# Patient Record
Sex: Female | Born: 1965
Health system: Southern US, Community
[De-identification: ages and names within clinical notes are randomized; demographics above are authoritative.]

## PROBLEM LIST (undated history)

## (undated) DIAGNOSIS — D509 Iron deficiency anemia, unspecified: Secondary | ICD-10-CM

## (undated) DIAGNOSIS — R600 Localized edema: Secondary | ICD-10-CM

## (undated) DIAGNOSIS — G4730 Sleep apnea, unspecified: Secondary | ICD-10-CM

## (undated) DIAGNOSIS — G9332 Myalgic encephalomyelitis/chronic fatigue syndrome: Secondary | ICD-10-CM

## (undated) DIAGNOSIS — K449 Diaphragmatic hernia without obstruction or gangrene: Secondary | ICD-10-CM

## (undated) DIAGNOSIS — K5730 Diverticulosis of large intestine without perforation or abscess without bleeding: Secondary | ICD-10-CM

## (undated) DIAGNOSIS — I5189 Other ill-defined heart diseases: Secondary | ICD-10-CM

## (undated) DIAGNOSIS — K219 Gastro-esophageal reflux disease without esophagitis: Secondary | ICD-10-CM

## (undated) DIAGNOSIS — Z950 Presence of cardiac pacemaker: Secondary | ICD-10-CM

## (undated) DIAGNOSIS — K829 Disease of gallbladder, unspecified: Secondary | ICD-10-CM

## (undated) DIAGNOSIS — D75839 Thrombocytosis, unspecified: Secondary | ICD-10-CM

## (undated) DIAGNOSIS — J309 Allergic rhinitis, unspecified: Secondary | ICD-10-CM

## (undated) DIAGNOSIS — G4733 Obstructive sleep apnea (adult) (pediatric): Secondary | ICD-10-CM

## (undated) DIAGNOSIS — D473 Essential (hemorrhagic) thrombocythemia: Secondary | ICD-10-CM

## (undated) DIAGNOSIS — E049 Nontoxic goiter, unspecified: Secondary | ICD-10-CM

## (undated) DIAGNOSIS — E538 Deficiency of other specified B group vitamins: Secondary | ICD-10-CM

## (undated) DIAGNOSIS — K760 Fatty (change of) liver, not elsewhere classified: Secondary | ICD-10-CM

## (undated) DIAGNOSIS — M549 Dorsalgia, unspecified: Secondary | ICD-10-CM

## (undated) DIAGNOSIS — I4891 Unspecified atrial fibrillation: Secondary | ICD-10-CM

## (undated) DIAGNOSIS — I10 Essential (primary) hypertension: Secondary | ICD-10-CM

## (undated) DIAGNOSIS — E039 Hypothyroidism, unspecified: Secondary | ICD-10-CM

## (undated) DIAGNOSIS — R011 Cardiac murmur, unspecified: Secondary | ICD-10-CM

## (undated) DIAGNOSIS — M1990 Unspecified osteoarthritis, unspecified site: Secondary | ICD-10-CM

## (undated) DIAGNOSIS — M2550 Pain in unspecified joint: Secondary | ICD-10-CM

## (undated) DIAGNOSIS — R079 Chest pain, unspecified: Secondary | ICD-10-CM

## (undated) DIAGNOSIS — E559 Vitamin D deficiency, unspecified: Secondary | ICD-10-CM

## (undated) DIAGNOSIS — K589 Irritable bowel syndrome without diarrhea: Secondary | ICD-10-CM

## (undated) DIAGNOSIS — J449 Chronic obstructive pulmonary disease, unspecified: Secondary | ICD-10-CM

## (undated) DIAGNOSIS — R0602 Shortness of breath: Secondary | ICD-10-CM

## (undated) HISTORY — DX: Unspecified osteoarthritis, unspecified site: M19.90

## (undated) HISTORY — DX: Sleep apnea, unspecified: G47.30

## (undated) HISTORY — DX: Other ill-defined heart diseases: I51.89

## (undated) HISTORY — DX: Fatty (change of) liver, not elsewhere classified: K76.0

## (undated) HISTORY — DX: Thrombocytosis, unspecified: D75.839

## (undated) HISTORY — DX: Presence of cardiac pacemaker: Z95.0

## (undated) HISTORY — DX: Morbid (severe) obesity due to excess calories: E66.01

## (undated) HISTORY — DX: Essential (hemorrhagic) thrombocythemia: D47.3

## (undated) HISTORY — DX: Chest pain, unspecified: R07.9

## (undated) HISTORY — DX: Vitamin D deficiency, unspecified: E55.9

## (undated) HISTORY — PX: LIPOMA EXCISION: SHX5283

## (undated) HISTORY — DX: Hypothyroidism, unspecified: E03.9

## (undated) HISTORY — DX: Diverticulosis of large intestine without perforation or abscess without bleeding: K57.30

## (undated) HISTORY — DX: Essential (primary) hypertension: I10

## (undated) HISTORY — DX: Localized edema: R60.0

## (undated) HISTORY — DX: Pain in unspecified joint: M25.50

## (undated) HISTORY — DX: Chronic obstructive pulmonary disease, unspecified: J44.9

## (undated) HISTORY — DX: Gastro-esophageal reflux disease without esophagitis: K21.9

## (undated) HISTORY — DX: Disease of gallbladder, unspecified: K82.9

## (undated) HISTORY — DX: Deficiency of other specified B group vitamins: E53.8

## (undated) HISTORY — DX: Diaphragmatic hernia without obstruction or gangrene: K44.9

## (undated) HISTORY — DX: Shortness of breath: R06.02

## (undated) HISTORY — DX: Obstructive sleep apnea (adult) (pediatric): G47.33

## (undated) HISTORY — DX: Iron deficiency anemia, unspecified: D50.9

## (undated) HISTORY — DX: Cardiac murmur, unspecified: R01.1

## (undated) HISTORY — DX: Nontoxic goiter, unspecified: E04.9

## (undated) HISTORY — DX: Irritable bowel syndrome, unspecified: K58.9

## (undated) HISTORY — DX: Unspecified atrial fibrillation: I48.91

## (undated) HISTORY — DX: Dorsalgia, unspecified: M54.9

## (undated) HISTORY — DX: Myalgic encephalomyelitis/chronic fatigue syndrome: G93.32

## (undated) HISTORY — DX: Allergic rhinitis, unspecified: J30.9

---

## 1998-03-23 ENCOUNTER — Emergency Department (HOSPITAL_COMMUNITY): Admission: EM | Admit: 1998-03-23 | Discharge: 1998-03-23 | Payer: Self-pay | Admitting: Emergency Medicine

## 1998-03-23 ENCOUNTER — Encounter: Payer: Self-pay | Admitting: Emergency Medicine

## 1998-08-17 ENCOUNTER — Other Ambulatory Visit: Admission: RE | Admit: 1998-08-17 | Discharge: 1998-08-17 | Payer: Self-pay | Admitting: Internal Medicine

## 1998-08-23 ENCOUNTER — Ambulatory Visit (HOSPITAL_COMMUNITY): Admission: RE | Admit: 1998-08-23 | Discharge: 1998-08-23 | Payer: Self-pay | Admitting: Internal Medicine

## 1999-12-26 ENCOUNTER — Other Ambulatory Visit: Admission: RE | Admit: 1999-12-26 | Discharge: 1999-12-26 | Payer: Self-pay | Admitting: Internal Medicine

## 2000-10-28 ENCOUNTER — Encounter: Admission: RE | Admit: 2000-10-28 | Discharge: 2001-01-26 | Payer: Self-pay | Admitting: Internal Medicine

## 2001-01-05 ENCOUNTER — Other Ambulatory Visit: Admission: RE | Admit: 2001-01-05 | Discharge: 2001-01-05 | Payer: Self-pay | Admitting: Internal Medicine

## 2001-01-14 ENCOUNTER — Ambulatory Visit (HOSPITAL_COMMUNITY): Admission: RE | Admit: 2001-01-14 | Discharge: 2001-01-14 | Payer: Self-pay | Admitting: Internal Medicine

## 2001-01-14 ENCOUNTER — Encounter: Payer: Self-pay | Admitting: Internal Medicine

## 2001-05-03 ENCOUNTER — Encounter: Admission: RE | Admit: 2001-05-03 | Discharge: 2001-08-01 | Payer: Self-pay | Admitting: Internal Medicine

## 2002-11-09 ENCOUNTER — Ambulatory Visit (HOSPITAL_COMMUNITY): Admission: RE | Admit: 2002-11-09 | Discharge: 2002-11-09 | Payer: Self-pay | Admitting: Internal Medicine

## 2002-11-09 ENCOUNTER — Encounter: Payer: Self-pay | Admitting: Internal Medicine

## 2002-11-11 ENCOUNTER — Encounter: Payer: Self-pay | Admitting: Internal Medicine

## 2002-11-11 ENCOUNTER — Encounter: Admission: RE | Admit: 2002-11-11 | Discharge: 2002-11-11 | Payer: Self-pay | Admitting: Internal Medicine

## 2004-10-15 ENCOUNTER — Encounter: Admission: RE | Admit: 2004-10-15 | Discharge: 2004-10-15 | Payer: Self-pay | Admitting: Internal Medicine

## 2005-09-24 ENCOUNTER — Encounter: Payer: Self-pay | Admitting: Internal Medicine

## 2006-07-17 ENCOUNTER — Encounter: Admission: RE | Admit: 2006-07-17 | Discharge: 2006-07-17 | Payer: Self-pay | Admitting: Family Medicine

## 2006-09-17 ENCOUNTER — Ambulatory Visit: Payer: Self-pay | Admitting: Family Medicine

## 2006-11-25 ENCOUNTER — Encounter (INDEPENDENT_AMBULATORY_CARE_PROVIDER_SITE_OTHER): Payer: Self-pay | Admitting: Family Medicine

## 2006-11-25 ENCOUNTER — Ambulatory Visit (HOSPITAL_BASED_OUTPATIENT_CLINIC_OR_DEPARTMENT_OTHER): Admission: RE | Admit: 2006-11-25 | Discharge: 2006-11-25 | Payer: Self-pay | Admitting: Surgery

## 2006-11-25 ENCOUNTER — Encounter (INDEPENDENT_AMBULATORY_CARE_PROVIDER_SITE_OTHER): Payer: Self-pay | Admitting: Surgery

## 2006-12-17 ENCOUNTER — Ambulatory Visit: Payer: Self-pay | Admitting: Family Medicine

## 2006-12-17 DIAGNOSIS — M549 Dorsalgia, unspecified: Secondary | ICD-10-CM | POA: Insufficient documentation

## 2006-12-17 DIAGNOSIS — E039 Hypothyroidism, unspecified: Secondary | ICD-10-CM | POA: Insufficient documentation

## 2006-12-17 DIAGNOSIS — M545 Low back pain, unspecified: Secondary | ICD-10-CM | POA: Insufficient documentation

## 2006-12-17 DIAGNOSIS — D759 Disease of blood and blood-forming organs, unspecified: Secondary | ICD-10-CM | POA: Insufficient documentation

## 2006-12-17 DIAGNOSIS — I10 Essential (primary) hypertension: Secondary | ICD-10-CM | POA: Insufficient documentation

## 2006-12-21 ENCOUNTER — Telehealth (INDEPENDENT_AMBULATORY_CARE_PROVIDER_SITE_OTHER): Payer: Self-pay | Admitting: *Deleted

## 2006-12-21 LAB — CONVERTED CEMR LAB
BUN: 9 mg/dL (ref 6–23)
Basophils Absolute: 0 10*3/uL (ref 0.0–0.1)
GFR calc Af Amer: 142 mL/min
Hemoglobin: 11.3 g/dL — ABNORMAL LOW (ref 12.0–15.0)
Lymphocytes Relative: 33.8 % (ref 12.0–46.0)
MCHC: 33.9 g/dL (ref 30.0–36.0)
MCV: 77 fL — ABNORMAL LOW (ref 78.0–100.0)
Monocytes Absolute: 0.7 10*3/uL (ref 0.2–0.7)
Monocytes Relative: 8.9 % (ref 3.0–11.0)
Neutro Abs: 4.4 10*3/uL (ref 1.4–7.7)
Potassium: 3.8 meq/L (ref 3.5–5.1)
TSH: 3.12 microintl units/mL (ref 0.35–5.50)

## 2006-12-23 ENCOUNTER — Ambulatory Visit: Payer: Self-pay | Admitting: Family Medicine

## 2006-12-23 DIAGNOSIS — D508 Other iron deficiency anemias: Secondary | ICD-10-CM | POA: Insufficient documentation

## 2006-12-23 LAB — CONVERTED CEMR LAB
Eosinophils Absolute: 0.1 10*3/uL (ref 0.0–0.6)
Eosinophils Relative: 1.3 % (ref 0.0–5.0)
Ferritin: 8.7 ng/mL — ABNORMAL LOW (ref 10.0–291.0)
Iron: 31 ug/dL — ABNORMAL LOW (ref 42–145)
MCV: 77.6 fL — ABNORMAL LOW (ref 78.0–100.0)
Monocytes Relative: 6 % (ref 3.0–11.0)
Platelets: 530 10*3/uL — ABNORMAL HIGH (ref 150–400)
RBC: 4.28 M/uL (ref 3.87–5.11)
Transferrin: 280.6 mg/dL (ref >=212.0)
WBC: 7.2 10*3/uL (ref 4.5–10.5)

## 2006-12-24 ENCOUNTER — Ambulatory Visit: Payer: Self-pay | Admitting: Family Medicine

## 2006-12-24 ENCOUNTER — Telehealth (INDEPENDENT_AMBULATORY_CARE_PROVIDER_SITE_OTHER): Payer: Self-pay | Admitting: *Deleted

## 2006-12-24 DIAGNOSIS — R079 Chest pain, unspecified: Secondary | ICD-10-CM | POA: Insufficient documentation

## 2006-12-24 DIAGNOSIS — J329 Chronic sinusitis, unspecified: Secondary | ICD-10-CM | POA: Insufficient documentation

## 2006-12-31 ENCOUNTER — Ambulatory Visit: Payer: Self-pay | Admitting: Family Medicine

## 2007-01-11 ENCOUNTER — Telehealth (INDEPENDENT_AMBULATORY_CARE_PROVIDER_SITE_OTHER): Payer: Self-pay | Admitting: *Deleted

## 2007-01-22 ENCOUNTER — Ambulatory Visit: Payer: Self-pay | Admitting: Gastroenterology

## 2007-01-27 ENCOUNTER — Ambulatory Visit: Payer: Self-pay | Admitting: Cardiology

## 2007-02-08 ENCOUNTER — Encounter (INDEPENDENT_AMBULATORY_CARE_PROVIDER_SITE_OTHER): Payer: Self-pay | Admitting: Family Medicine

## 2007-02-08 ENCOUNTER — Ambulatory Visit: Payer: Self-pay

## 2007-02-09 ENCOUNTER — Ambulatory Visit: Payer: Self-pay

## 2007-02-11 ENCOUNTER — Ambulatory Visit: Payer: Self-pay | Admitting: Family Medicine

## 2007-02-14 LAB — CONVERTED CEMR LAB
Basophils Absolute: 0 10*3/uL (ref 0.0–0.1)
Basophils Relative: 0.6 % (ref 0.0–1.0)
Hemoglobin: 12.6 g/dL (ref 12.0–15.0)
Monocytes Absolute: 0.5 10*3/uL (ref 0.2–0.7)
Monocytes Relative: 7.3 % (ref 3.0–11.0)
Platelets: 518 10*3/uL — ABNORMAL HIGH (ref 150–400)
RBC: 4.71 M/uL (ref 3.87–5.11)
RDW: 18.2 % — ABNORMAL HIGH (ref 11.5–14.6)

## 2007-02-15 ENCOUNTER — Telehealth (INDEPENDENT_AMBULATORY_CARE_PROVIDER_SITE_OTHER): Payer: Self-pay | Admitting: *Deleted

## 2007-02-22 ENCOUNTER — Ambulatory Visit: Payer: Self-pay | Admitting: Gastroenterology

## 2007-02-24 ENCOUNTER — Telehealth (INDEPENDENT_AMBULATORY_CARE_PROVIDER_SITE_OTHER): Payer: Self-pay | Admitting: *Deleted

## 2007-02-26 ENCOUNTER — Encounter (INDEPENDENT_AMBULATORY_CARE_PROVIDER_SITE_OTHER): Payer: Self-pay | Admitting: Family Medicine

## 2007-03-24 ENCOUNTER — Ambulatory Visit: Payer: Self-pay | Admitting: Family Medicine

## 2007-03-30 ENCOUNTER — Telehealth (INDEPENDENT_AMBULATORY_CARE_PROVIDER_SITE_OTHER): Payer: Self-pay | Admitting: *Deleted

## 2007-03-30 LAB — CONVERTED CEMR LAB
Basophils Relative: 0.3 % (ref 0.0–1.0)
CO2: 32 meq/L (ref 19–32)
Eosinophils Absolute: 0.1 10*3/uL (ref 0.0–0.6)
Eosinophils Relative: 1.5 % (ref 0.0–5.0)
GFR calc Af Amer: 503 mL/min
GFR calc non Af Amer: 416 mL/min
Glucose, Bld: 105 mg/dL — ABNORMAL HIGH (ref 70–99)
HCT: 37.3 % (ref 36.0–46.0)
Hemoglobin: 12.4 g/dL (ref 12.0–15.0)
Lymphocytes Relative: 32.4 % (ref 12.0–46.0)
MCV: 84.1 fL (ref 78.0–100.0)
Monocytes Absolute: 0.4 10*3/uL (ref 0.2–0.7)
Neutro Abs: 4.6 10*3/uL (ref 1.4–7.7)
Neutrophils Relative %: 61 % (ref 43.0–77.0)
Sodium: 139 meq/L (ref 135–145)
WBC: 7.6 10*3/uL (ref 4.5–10.5)

## 2007-04-05 ENCOUNTER — Ambulatory Visit: Payer: Self-pay | Admitting: Oncology

## 2007-04-06 ENCOUNTER — Ambulatory Visit: Payer: Self-pay | Admitting: Family Medicine

## 2007-04-26 HISTORY — PX: ENDOMETRIAL ABLATION: SHX621

## 2007-04-27 LAB — CBC WITH DIFFERENTIAL/PLATELET
Basophils Absolute: 0 10*3/uL (ref 0.0–0.1)
EOS%: 1.2 % (ref 0.0–7.0)
Eosinophils Absolute: 0.1 10*3/uL (ref 0.0–0.5)
HGB: 13.3 g/dL (ref 11.6–15.9)
MCH: 28.8 pg (ref 26.0–34.0)
NEUT#: 5 10*3/uL (ref 1.5–6.5)
RDW: 16.5 % — ABNORMAL HIGH (ref 11.3–14.5)
WBC: 8.7 10*3/uL (ref 3.9–10.0)
lymph#: 3 10*3/uL (ref 0.9–3.3)

## 2007-04-27 LAB — COMPREHENSIVE METABOLIC PANEL
AST: 21 U/L (ref 0–37)
Albumin: 4.2 g/dL (ref 3.5–5.2)
BUN: 11 mg/dL (ref 6–23)
Calcium: 9.3 mg/dL (ref 8.4–10.5)
Chloride: 102 mEq/L (ref 96–112)
Potassium: 3.8 mEq/L (ref 3.5–5.3)

## 2007-04-27 LAB — IRON AND TIBC: TIBC: 340 ug/dL (ref 250–470)

## 2007-05-18 ENCOUNTER — Encounter (INDEPENDENT_AMBULATORY_CARE_PROVIDER_SITE_OTHER): Payer: Self-pay | Admitting: Obstetrics and Gynecology

## 2007-05-18 ENCOUNTER — Ambulatory Visit (HOSPITAL_COMMUNITY): Admission: RE | Admit: 2007-05-18 | Discharge: 2007-05-18 | Payer: Self-pay | Admitting: Obstetrics and Gynecology

## 2007-06-17 ENCOUNTER — Emergency Department (HOSPITAL_COMMUNITY): Admission: EM | Admit: 2007-06-17 | Discharge: 2007-06-17 | Payer: Self-pay | Admitting: Emergency Medicine

## 2007-06-17 ENCOUNTER — Telehealth (INDEPENDENT_AMBULATORY_CARE_PROVIDER_SITE_OTHER): Payer: Self-pay | Admitting: Family Medicine

## 2007-06-18 ENCOUNTER — Ambulatory Visit: Payer: Self-pay | Admitting: Family Medicine

## 2007-06-18 DIAGNOSIS — R51 Headache: Secondary | ICD-10-CM | POA: Insufficient documentation

## 2007-06-18 DIAGNOSIS — R519 Headache, unspecified: Secondary | ICD-10-CM | POA: Insufficient documentation

## 2007-07-06 ENCOUNTER — Ambulatory Visit: Payer: Self-pay | Admitting: Family Medicine

## 2007-07-06 ENCOUNTER — Encounter (INDEPENDENT_AMBULATORY_CARE_PROVIDER_SITE_OTHER): Payer: Self-pay | Admitting: *Deleted

## 2007-07-06 LAB — CONVERTED CEMR LAB
BUN: 11 mg/dL (ref 6–23)
CO2: 30 meq/L (ref 19–32)
Creatinine, Ser: 0.7 mg/dL (ref 0.4–1.2)
GFR calc Af Amer: 118 mL/min
Glucose, Bld: 92 mg/dL (ref 70–99)
Potassium: 3.8 meq/L (ref 3.5–5.1)

## 2007-07-23 ENCOUNTER — Ambulatory Visit: Payer: Self-pay | Admitting: Family Medicine

## 2007-07-30 ENCOUNTER — Ambulatory Visit: Payer: Self-pay | Admitting: Family Medicine

## 2007-08-02 LAB — CONVERTED CEMR LAB
Basophils Relative: 0.3 % (ref 0.0–1.0)
Eosinophils Relative: 1.6 % (ref 0.0–5.0)
HDL: 36.6 mg/dL — ABNORMAL LOW (ref >39.0)
Hemoglobin: 13.2 g/dL (ref 12.0–15.0)
LDL Cholesterol: 140 mg/dL — ABNORMAL HIGH (ref 0–99)
Lymphocytes Relative: 30.5 % (ref 12.0–46.0)
Monocytes Relative: 4.4 % (ref 3.0–11.0)
Neutro Abs: 4.7 10*3/uL (ref 1.4–7.7)
Platelets: 446 10*3/uL — ABNORMAL HIGH (ref 150–400)
RDW: 13.9 % (ref 11.5–14.6)
Triglycerides: 83 mg/dL (ref 0–149)
VLDL: 17 mg/dL (ref 0–40)
WBC: 7.4 10*3/uL (ref 4.5–10.5)

## 2007-08-04 ENCOUNTER — Telehealth (INDEPENDENT_AMBULATORY_CARE_PROVIDER_SITE_OTHER): Payer: Self-pay | Admitting: Family Medicine

## 2007-08-04 ENCOUNTER — Encounter (INDEPENDENT_AMBULATORY_CARE_PROVIDER_SITE_OTHER): Payer: Self-pay | Admitting: *Deleted

## 2007-08-06 ENCOUNTER — Encounter: Admission: RE | Admit: 2007-08-06 | Discharge: 2007-08-06 | Payer: Self-pay | Admitting: Family Medicine

## 2007-08-23 DIAGNOSIS — J309 Allergic rhinitis, unspecified: Secondary | ICD-10-CM | POA: Insufficient documentation

## 2007-08-23 DIAGNOSIS — D649 Anemia, unspecified: Secondary | ICD-10-CM | POA: Insufficient documentation

## 2007-08-23 DIAGNOSIS — K5730 Diverticulosis of large intestine without perforation or abscess without bleeding: Secondary | ICD-10-CM | POA: Insufficient documentation

## 2007-09-27 ENCOUNTER — Ambulatory Visit: Payer: Self-pay | Admitting: Oncology

## 2007-09-29 LAB — CBC WITH DIFFERENTIAL/PLATELET
Eosinophils Absolute: 0.1 10*3/uL (ref 0.0–0.5)
HCT: 39.3 % (ref 34.8–46.6)
LYMPH%: 31.3 % (ref 14.0–48.0)
MCHC: 34.9 g/dL (ref 32.0–36.0)
MONO#: 0.5 10*3/uL (ref 0.1–0.9)
NEUT%: 62.5 % (ref 39.6–76.8)
Platelets: 594 10*3/uL — ABNORMAL HIGH (ref 145–400)
WBC: 10 10*3/uL (ref 3.9–10.0)

## 2007-09-29 LAB — FERRITIN: Ferritin: 58 ng/mL (ref 10–291)

## 2007-09-29 LAB — IRON AND TIBC: %SAT: 19 % — ABNORMAL LOW (ref 20–55)

## 2007-10-13 ENCOUNTER — Telehealth (INDEPENDENT_AMBULATORY_CARE_PROVIDER_SITE_OTHER): Payer: Self-pay | Admitting: *Deleted

## 2007-11-22 ENCOUNTER — Ambulatory Visit: Payer: Self-pay | Admitting: Family Medicine

## 2007-11-30 ENCOUNTER — Telehealth (INDEPENDENT_AMBULATORY_CARE_PROVIDER_SITE_OTHER): Payer: Self-pay | Admitting: *Deleted

## 2007-11-30 DIAGNOSIS — E049 Nontoxic goiter, unspecified: Secondary | ICD-10-CM | POA: Insufficient documentation

## 2007-12-02 ENCOUNTER — Encounter (INDEPENDENT_AMBULATORY_CARE_PROVIDER_SITE_OTHER): Payer: Self-pay | Admitting: *Deleted

## 2007-12-02 LAB — CONVERTED CEMR LAB
Basophils Absolute: 0 10*3/uL (ref 0.0–0.1)
CO2: 30 meq/L (ref 19–32)
Calcium: 8.9 mg/dL (ref 8.4–10.5)
Chloride: 100 meq/L (ref 96–112)
Cholesterol: 198 mg/dL (ref 0–200)
Free T4: 1.2 ng/dL (ref 0.6–1.6)
Glucose, Bld: 89 mg/dL (ref 70–99)
HDL: 39.4 mg/dL (ref >39.0)
LDL Cholesterol: 134 mg/dL — ABNORMAL HIGH (ref 0–99)
Lymphocytes Relative: 31.5 % (ref 12.0–46.0)
MCHC: 33.5 g/dL (ref 30.0–36.0)
Monocytes Relative: 7.4 % (ref 3.0–12.0)
Neutro Abs: 4.5 10*3/uL (ref 1.4–7.7)
Neutrophils Relative %: 59 % (ref 43.0–77.0)
Platelets: 451 10*3/uL — ABNORMAL HIGH (ref 150–400)
Potassium: 3.4 meq/L — ABNORMAL LOW (ref 3.5–5.1)
RDW: 13.2 % (ref 11.5–14.6)
Sodium: 139 meq/L (ref 135–145)
T3, Free: 3.6 pg/mL (ref 2.3–4.2)
TSH: 1.36 microintl units/mL (ref 0.35–5.50)
Total CHOL/HDL Ratio: 5
Triglycerides: 125 mg/dL (ref 0–149)
VLDL: 25 mg/dL (ref 0–40)

## 2007-12-03 ENCOUNTER — Encounter: Admission: RE | Admit: 2007-12-03 | Discharge: 2007-12-03 | Payer: Self-pay | Admitting: Family Medicine

## 2007-12-06 ENCOUNTER — Telehealth (INDEPENDENT_AMBULATORY_CARE_PROVIDER_SITE_OTHER): Payer: Self-pay | Admitting: *Deleted

## 2007-12-08 ENCOUNTER — Ambulatory Visit: Payer: Self-pay | Admitting: Pulmonary Disease

## 2007-12-13 ENCOUNTER — Encounter: Payer: Self-pay | Admitting: Pulmonary Disease

## 2007-12-13 ENCOUNTER — Ambulatory Visit (HOSPITAL_BASED_OUTPATIENT_CLINIC_OR_DEPARTMENT_OTHER): Admission: RE | Admit: 2007-12-13 | Discharge: 2007-12-13 | Payer: Self-pay | Admitting: Pulmonary Disease

## 2007-12-16 ENCOUNTER — Ambulatory Visit: Payer: Self-pay | Admitting: Family Medicine

## 2007-12-17 ENCOUNTER — Encounter (INDEPENDENT_AMBULATORY_CARE_PROVIDER_SITE_OTHER): Payer: Self-pay | Admitting: *Deleted

## 2007-12-17 LAB — CONVERTED CEMR LAB
BUN: 9 mg/dL (ref 6–23)
GFR calc Af Amer: 141 mL/min
GFR calc non Af Amer: 117 mL/min
Potassium: 3.8 meq/L (ref 3.5–5.1)

## 2007-12-30 ENCOUNTER — Telehealth (INDEPENDENT_AMBULATORY_CARE_PROVIDER_SITE_OTHER): Payer: Self-pay | Admitting: *Deleted

## 2007-12-30 ENCOUNTER — Ambulatory Visit: Payer: Self-pay | Admitting: Pulmonary Disease

## 2007-12-31 ENCOUNTER — Telehealth: Payer: Self-pay | Admitting: Family Medicine

## 2007-12-31 ENCOUNTER — Emergency Department (HOSPITAL_COMMUNITY): Admission: EM | Admit: 2007-12-31 | Discharge: 2007-12-31 | Payer: Self-pay | Admitting: Emergency Medicine

## 2008-01-04 ENCOUNTER — Ambulatory Visit: Payer: Self-pay | Admitting: Family Medicine

## 2008-01-04 DIAGNOSIS — R197 Diarrhea, unspecified: Secondary | ICD-10-CM | POA: Insufficient documentation

## 2008-01-04 DIAGNOSIS — J069 Acute upper respiratory infection, unspecified: Secondary | ICD-10-CM | POA: Insufficient documentation

## 2008-01-04 DIAGNOSIS — K219 Gastro-esophageal reflux disease without esophagitis: Secondary | ICD-10-CM | POA: Insufficient documentation

## 2008-01-04 DIAGNOSIS — K589 Irritable bowel syndrome without diarrhea: Secondary | ICD-10-CM | POA: Insufficient documentation

## 2008-01-04 LAB — CONVERTED CEMR LAB
Glucose, Urine, Semiquant: NEGATIVE
Ketones, urine, test strip: NEGATIVE
Nitrite: NEGATIVE
WBC Urine, dipstick: NEGATIVE
pH: 6.5

## 2008-01-05 ENCOUNTER — Ambulatory Visit: Payer: Self-pay | Admitting: Pulmonary Disease

## 2008-01-06 ENCOUNTER — Encounter: Payer: Self-pay | Admitting: Family Medicine

## 2008-01-07 ENCOUNTER — Encounter (INDEPENDENT_AMBULATORY_CARE_PROVIDER_SITE_OTHER): Payer: Self-pay | Admitting: *Deleted

## 2008-01-10 ENCOUNTER — Encounter (INDEPENDENT_AMBULATORY_CARE_PROVIDER_SITE_OTHER): Payer: Self-pay | Admitting: *Deleted

## 2008-01-18 ENCOUNTER — Encounter (INDEPENDENT_AMBULATORY_CARE_PROVIDER_SITE_OTHER): Payer: Self-pay | Admitting: *Deleted

## 2008-01-18 LAB — CONVERTED CEMR LAB
ALT: 18 units/L (ref 0–35)
AST: 21 units/L (ref 0–37)
Alkaline Phosphatase: 106 units/L (ref 39–117)
Basophils Absolute: 0 10*3/uL (ref 0.0–0.1)
Bilirubin, Direct: 0.1 mg/dL (ref 0.0–0.3)
CO2: 28 meq/L (ref 19–32)
Chloride: 98 meq/L (ref 96–112)
Lymphocytes Relative: 31.6 % (ref 12.0–46.0)
MCHC: 34 g/dL (ref 30.0–36.0)
Neutrophils Relative %: 60 % (ref 43.0–77.0)
RBC: 4.49 M/uL (ref 3.87–5.11)
RDW: 13.2 % (ref 11.5–14.6)
Sodium: 136 meq/L (ref 135–145)
TSH: 2.35 microintl units/mL (ref 0.35–5.50)
Total Bilirubin: 0.8 mg/dL (ref 0.3–1.2)

## 2008-02-02 ENCOUNTER — Ambulatory Visit: Payer: Self-pay | Admitting: Pulmonary Disease

## 2008-02-23 ENCOUNTER — Encounter: Payer: Self-pay | Admitting: Pulmonary Disease

## 2008-03-20 ENCOUNTER — Telehealth (INDEPENDENT_AMBULATORY_CARE_PROVIDER_SITE_OTHER): Payer: Self-pay | Admitting: *Deleted

## 2008-03-31 ENCOUNTER — Ambulatory Visit: Payer: Self-pay | Admitting: Oncology

## 2008-04-04 ENCOUNTER — Encounter: Payer: Self-pay | Admitting: Family Medicine

## 2008-04-04 LAB — CBC WITH DIFFERENTIAL/PLATELET
BASO%: 0.5 % (ref 0.0–2.0)
Basophils Absolute: 0.1 10*3/uL (ref 0.0–0.1)
EOS%: 0.5 % (ref 0.0–7.0)
HGB: 13.5 g/dL (ref 11.6–15.9)
MCH: 30.5 pg (ref 26.0–34.0)
MCHC: 34 g/dL (ref 32.0–36.0)
MCV: 89.9 fL (ref 81.0–101.0)
MONO%: 6.9 % (ref 0.0–13.0)
RBC: 4.42 10*6/uL (ref 3.70–5.32)
RDW: 14.5 % (ref 11.3–14.5)

## 2008-04-04 LAB — IRON AND TIBC: UIBC: 259 ug/dL

## 2008-09-05 ENCOUNTER — Ambulatory Visit: Payer: Self-pay | Admitting: Family Medicine

## 2008-09-17 LAB — CONVERTED CEMR LAB
BUN: 12 mg/dL (ref 6–23)
Eosinophils Relative: 1.8 % (ref 0.0–5.0)
Free T4: 0.9 ng/dL (ref 0.6–1.6)
GFR calc non Af Amer: 140.1 mL/min (ref >60)
HCT: 40.2 % (ref 36.0–46.0)
Iron: 64 ug/dL (ref 42–145)
Lymphs Abs: 2.9 10*3/uL (ref 0.7–4.0)
Monocytes Relative: 6.4 % (ref 3.0–12.0)
Platelets: 409 10*3/uL — ABNORMAL HIGH (ref 150.0–400.0)
Potassium: 3.6 meq/L (ref 3.5–5.1)
Sodium: 141 meq/L (ref 135–145)
T3, Free: 3.5 pg/mL (ref 2.3–4.2)
TSH: 1.18 microintl units/mL (ref 0.35–5.50)
Transferrin: 225.5 mg/dL (ref 212.0–360.0)
WBC: 7.8 10*3/uL (ref 4.5–10.5)

## 2008-09-18 ENCOUNTER — Encounter (INDEPENDENT_AMBULATORY_CARE_PROVIDER_SITE_OTHER): Payer: Self-pay | Admitting: *Deleted

## 2008-09-25 ENCOUNTER — Ambulatory Visit: Payer: Self-pay | Admitting: Pulmonary Disease

## 2008-09-25 DIAGNOSIS — G4733 Obstructive sleep apnea (adult) (pediatric): Secondary | ICD-10-CM | POA: Insufficient documentation

## 2008-09-27 ENCOUNTER — Telehealth (INDEPENDENT_AMBULATORY_CARE_PROVIDER_SITE_OTHER): Payer: Self-pay | Admitting: *Deleted

## 2008-10-02 ENCOUNTER — Ambulatory Visit: Payer: Self-pay | Admitting: Oncology

## 2008-12-11 ENCOUNTER — Telehealth (INDEPENDENT_AMBULATORY_CARE_PROVIDER_SITE_OTHER): Payer: Self-pay | Admitting: *Deleted

## 2008-12-21 ENCOUNTER — Encounter: Admission: RE | Admit: 2008-12-21 | Discharge: 2008-12-21 | Payer: Self-pay | Admitting: Family Medicine

## 2009-01-01 ENCOUNTER — Ambulatory Visit: Payer: Self-pay | Admitting: Family Medicine

## 2009-01-23 ENCOUNTER — Encounter: Payer: Self-pay | Admitting: Family Medicine

## 2009-02-07 ENCOUNTER — Ambulatory Visit: Payer: Self-pay | Admitting: Family Medicine

## 2009-02-07 LAB — CONVERTED CEMR LAB
Bilirubin Urine: NEGATIVE
Glucose, Urine, Semiquant: NEGATIVE
Protein, U semiquant: NEGATIVE
Specific Gravity, Urine: <1.005
pH: 6.5

## 2009-02-09 ENCOUNTER — Encounter (INDEPENDENT_AMBULATORY_CARE_PROVIDER_SITE_OTHER): Payer: Self-pay | Admitting: *Deleted

## 2009-02-09 LAB — CONVERTED CEMR LAB
ALT: 21 units/L (ref 0–35)
Albumin: 3.8 g/dL (ref 3.5–5.2)
Alkaline Phosphatase: 104 units/L (ref 39–117)
Basophils Relative: 0.3 % (ref 0.0–3.0)
CO2: 29 meq/L (ref 19–32)
Chloride: 103 meq/L (ref 96–112)
Eosinophils Absolute: 0.1 10*3/uL (ref 0.0–0.7)
Eosinophils Relative: 1.8 % (ref 0.0–5.0)
Hemoglobin: 13.5 g/dL (ref 12.0–15.0)
Lymphocytes Relative: 32 % (ref 12.0–46.0)
MCHC: 33 g/dL (ref 30.0–36.0)
MCV: 91.5 fL (ref 78.0–100.0)
Monocytes Absolute: 0.5 10*3/uL (ref 0.1–1.0)
Neutro Abs: 4.4 10*3/uL (ref 1.4–7.7)
Potassium: 4 meq/L (ref 3.5–5.1)
RBC: 4.48 M/uL (ref 3.87–5.11)
Sodium: 139 meq/L (ref 135–145)
Total CHOL/HDL Ratio: 4
Total Protein: 7.6 g/dL (ref 6.0–8.3)
Vitamin B-12: 421 pg/mL (ref 211–911)
WBC: 7.4 10*3/uL (ref 4.5–10.5)

## 2009-05-31 ENCOUNTER — Ambulatory Visit: Payer: Self-pay | Admitting: Family Medicine

## 2009-07-05 ENCOUNTER — Ambulatory Visit: Payer: Self-pay | Admitting: Family Medicine

## 2009-07-05 DIAGNOSIS — R5383 Other fatigue: Secondary | ICD-10-CM

## 2009-07-05 DIAGNOSIS — R5381 Other malaise: Secondary | ICD-10-CM | POA: Insufficient documentation

## 2009-07-11 ENCOUNTER — Telehealth: Payer: Self-pay | Admitting: Family Medicine

## 2009-07-11 DIAGNOSIS — M2550 Pain in unspecified joint: Secondary | ICD-10-CM | POA: Insufficient documentation

## 2009-07-11 LAB — CONVERTED CEMR LAB
ALT: 28 units/L (ref 0–35)
AST: 29 units/L (ref 0–37)
Alkaline Phosphatase: 121 units/L — ABNORMAL HIGH (ref 39–117)
Anti Nuclear Antibody(ANA): POSITIVE — AB
BUN: 12 mg/dL (ref 6–23)
Basophils Absolute: 0 10*3/uL (ref 0.0–0.1)
Bilirubin, Direct: 0 mg/dL (ref 0.0–0.3)
Calcium: 9.2 mg/dL (ref 8.4–10.5)
Cholesterol: 191 mg/dL (ref 0–200)
Creatinine, Ser: 0.6 mg/dL (ref 0.4–1.2)
Eosinophils Relative: 2.5 % (ref 0.0–5.0)
Folate: 13.4 ng/mL
Free T4: 0.8 ng/dL (ref 0.6–1.6)
GFR calc non Af Amer: 139.56 mL/min (ref >60)
Glucose, Bld: 86 mg/dL (ref 70–99)
HCT: 41 % (ref 36.0–46.0)
HDL: 51.3 mg/dL (ref >39.00)
LDL Cholesterol: 118 mg/dL — ABNORMAL HIGH (ref 0–99)
Lymphocytes Relative: 38 % (ref 12.0–46.0)
Monocytes Relative: 5.9 % (ref 3.0–12.0)
Neutrophils Relative %: 53.4 % (ref 43.0–77.0)
Platelets: 432 10*3/uL — ABNORMAL HIGH (ref 150.0–400.0)
Potassium: 3.9 meq/L (ref 3.5–5.1)
RDW: 13.1 % (ref 11.5–14.6)
T3, Free: 3.2 pg/mL (ref 2.3–4.2)
TSH: 1.89 microintl units/mL (ref 0.35–5.50)
Total Bilirubin: 0.5 mg/dL (ref 0.3–1.2)
VLDL: 21.4 mg/dL (ref 0.0–40.0)
WBC: 7.4 10*3/uL (ref 4.5–10.5)

## 2009-07-13 ENCOUNTER — Ambulatory Visit: Payer: Self-pay | Admitting: Family Medicine

## 2009-07-19 ENCOUNTER — Encounter (INDEPENDENT_AMBULATORY_CARE_PROVIDER_SITE_OTHER): Payer: Self-pay | Admitting: *Deleted

## 2009-08-16 ENCOUNTER — Telehealth (INDEPENDENT_AMBULATORY_CARE_PROVIDER_SITE_OTHER): Payer: Self-pay | Admitting: *Deleted

## 2009-12-05 ENCOUNTER — Ambulatory Visit: Payer: Self-pay | Admitting: Family Medicine

## 2009-12-05 DIAGNOSIS — R002 Palpitations: Secondary | ICD-10-CM | POA: Insufficient documentation

## 2009-12-05 DIAGNOSIS — R011 Cardiac murmur, unspecified: Secondary | ICD-10-CM | POA: Insufficient documentation

## 2009-12-06 ENCOUNTER — Ambulatory Visit: Payer: Self-pay

## 2009-12-06 ENCOUNTER — Ambulatory Visit (HOSPITAL_COMMUNITY): Admission: RE | Admit: 2009-12-06 | Discharge: 2009-12-06 | Payer: Self-pay | Admitting: Family Medicine

## 2009-12-06 ENCOUNTER — Encounter: Payer: Self-pay | Admitting: Family Medicine

## 2009-12-06 ENCOUNTER — Ambulatory Visit: Payer: Self-pay | Admitting: Cardiology

## 2009-12-11 DIAGNOSIS — I519 Heart disease, unspecified: Secondary | ICD-10-CM | POA: Insufficient documentation

## 2009-12-11 DIAGNOSIS — R9389 Abnormal findings on diagnostic imaging of other specified body structures: Secondary | ICD-10-CM | POA: Insufficient documentation

## 2009-12-13 ENCOUNTER — Ambulatory Visit: Payer: Self-pay | Admitting: Cardiology

## 2009-12-13 DIAGNOSIS — R0602 Shortness of breath: Secondary | ICD-10-CM | POA: Insufficient documentation

## 2009-12-17 ENCOUNTER — Ambulatory Visit: Payer: Self-pay | Admitting: Pulmonary Disease

## 2009-12-24 ENCOUNTER — Telehealth (INDEPENDENT_AMBULATORY_CARE_PROVIDER_SITE_OTHER): Payer: Self-pay | Admitting: *Deleted

## 2010-01-09 ENCOUNTER — Ambulatory Visit: Payer: Self-pay | Admitting: Internal Medicine

## 2010-02-08 ENCOUNTER — Ambulatory Visit: Payer: Self-pay | Admitting: Family Medicine

## 2010-02-18 ENCOUNTER — Encounter: Payer: Self-pay | Admitting: Family Medicine

## 2010-03-08 ENCOUNTER — Ambulatory Visit: Payer: Self-pay | Admitting: Family Medicine

## 2010-03-08 DIAGNOSIS — M359 Systemic involvement of connective tissue, unspecified: Secondary | ICD-10-CM | POA: Insufficient documentation

## 2010-03-08 DIAGNOSIS — K449 Diaphragmatic hernia without obstruction or gangrene: Secondary | ICD-10-CM | POA: Insufficient documentation

## 2010-03-11 ENCOUNTER — Telehealth: Payer: Self-pay | Admitting: Family Medicine

## 2010-03-12 ENCOUNTER — Encounter: Admission: RE | Admit: 2010-03-12 | Discharge: 2010-03-12 | Payer: Self-pay | Admitting: Family Medicine

## 2010-04-08 ENCOUNTER — Ambulatory Visit: Payer: Self-pay | Admitting: Family Medicine

## 2010-04-08 DIAGNOSIS — M48061 Spinal stenosis, lumbar region without neurogenic claudication: Secondary | ICD-10-CM | POA: Insufficient documentation

## 2010-05-08 ENCOUNTER — Ambulatory Visit: Payer: Self-pay | Admitting: Family Medicine

## 2010-06-17 HISTORY — PX: BARIATRIC SURGERY: SHX1103

## 2010-06-23 LAB — CONVERTED CEMR LAB
ALT: 21 units/L (ref 0–35)
Albumin: 4.1 g/dL (ref 3.5–5.2)
BUN: 15 mg/dL (ref 6–23)
Basophils Relative: 0.7 % (ref 0.0–3.0)
CO2: 31 meq/L (ref 19–32)
Chloride: 102 meq/L (ref 96–112)
Creatinine, Ser: 0.6 mg/dL (ref 0.4–1.2)
Eosinophils Absolute: 0.2 10*3/uL (ref 0.0–0.7)
Eosinophils Relative: 1.9 % (ref 0.0–5.0)
Free T4: 0.96 ng/dL (ref 0.60–1.60)
HCT: 39.3 % (ref 36.0–46.0)
Lymphs Abs: 2.8 10*3/uL (ref 0.7–4.0)
MCHC: 34.1 g/dL (ref 30.0–36.0)
MCV: 90.6 fL (ref 78.0–100.0)
Monocytes Absolute: 0.5 10*3/uL (ref 0.1–1.0)
Potassium: 4.2 meq/L (ref 3.5–5.1)
RBC: 4.34 M/uL (ref 3.87–5.11)
T3, Free: 3 pg/mL (ref 2.3–4.2)
TSH: 1.53 microintl units/mL (ref 0.35–5.50)
Total Protein: 7.3 g/dL (ref 6.0–8.3)
Transferrin: 226.1 mg/dL (ref 212.0–360.0)
Vitamin B-12: 376 pg/mL (ref 211–911)
WBC: 8.4 10*3/uL (ref 4.5–10.5)

## 2010-06-25 NOTE — Assessment & Plan Note (Signed)
Summary: abn echo/jml  Medications Added TOPROL XL 50 MG XR24H-TAB (METOPROLOL SUCCINATE) Take 1 tablet daily        Referring Provider:  Loreen Freud Primary Provider:  Laury Axon  CC:  headache and sob.  History of Present Illness: Pleasant female I saw in September 2008 secondary to chest pain. A Myoview was performed on February 08, 2007 and revealed an ejection fraction of 58%. There was no ischemia or infarction. Echocardiogram in July of 2051for palpitations revealed and ejection fraction of 55% to 60%. Doppler parameters consistent with abnormal left ventricular relaxation (grade 1 diastolic dysfunction). There was mild aortic stenosis (peak velocity of 2.1 m/s) and miild regurgitation. The left atrium was mildly dilated. Aortic valve not well visualized as study technically difficult; however there is elevation of LVOT veleocity suggestive of mild AS. Cardiology was asked to evaluate for abnormal echocardiogram. Patient typically does not have significant dyspnea on exertion, orthopnea, PND or exertional chest pain. Approximately 2-3 weeks ago she had an episode of palpitations. They were sudden in onset and described as her heart racing. She felt presyncopal but did not have syncope. There was no associated chest pain but there was mild dyspnea and a dull headache. She also felt fatigued after the event. The palpitations resolved spontaneously after approximately 15 seconds. She has noticed mild dyspnea since then. There is also been an occasional tingling in her left chest. Occasional mild pedal edema.  Current Medications (verified): 1)  Diovan Hct 80-12.5 Mg Tabs (Valsartan-Hydrochlorothiazide) .Marland Kitchen.. 1 By Mouth Once Daily 2)  Levothyroxine Sodium 88 Mcg  Tabs (Levothyroxine Sodium) .... Take One Tablet Daily 3)  Ferrous Sulfate 325 (65 Fe) Mg  Tbec (Ferrous Sulfate) .... Take By Mouth As Needed 4)  Claritin-D 12 Hour 5-120 Mg  Tb12 (Loratadine-Pseudoephedrine) .... Take By Mouth As  Needed 5)  Nexium 40 Mg  Pack (Esomeprazole Magnesium) .Marland Kitchen.. 1 By Mouth Once Daily As Needed 6)  Veramyst 27.5 Mcg/spray  Susp (Fluticasone Furoate) .... 2 Sprays Each Nostril Once Daily As Needed 7)  Cpap 10 .... At Bedtime 8)  Klor-Con M20 20 Meq Cr-Tabs (Potassium Chloride Crys Cr) .... Take One Tablet  By Mouth Once Daily. 9)  Zyrtec Allergy 10 Mg Tabs (Cetirizine Hcl) .Marland Kitchen.. 1 By Mouth Once Daily  Allergies: 1)  ! Penicillin  Past History:  Past Medical History: HYPERTENSION  DIASTOLIC DYSFUNCTION   OBSTRUCTIVE SLEEP APNEA  IBS GERD GOITER, UNSPECIFIED  MORBID OBESITY  DIVERTICULOSIS, COLON ANEMIA, IRON DEFICIENCY NEC  THROMBOCYTOSIS  Mixed connective tissue disease  Past Surgical History: Reviewed history from 11/22/2007 and no changes required. Lipoma removed upper back endometrial ablation 12/08  Family History: Reviewed history from 12/08/2007 and no changes required. Mother with unknown arrhythmia and breast cancer Father with hypertension and renal cell cancer  Social History: Reviewed history from 12/08/2007 and no changes required. Patient never smoked.  pt is married. Alcohol Use - no  Review of Systems       Low back pain but no fevers or chills, productive cough, hemoptysis, dysphasia, odynophagia, melena, hematochezia, dysuria, hematuria, rash, seizure activity, orthopnea, PND,  claudication. Remaining systems are negative.   Vital Signs:  Patient profile:   45 year old female Height:      64 inches Weight:      290 pounds BMI:     49.96 Pulse rate:   71 / minute Resp:     14 per minute BP sitting:   160 / 100  (left arm)  Vitals  Entered By: Kem Parkinson (December 13, 2009 12:02 PM)  Physical Exam  General:  Well developed/obese in NAD Skin warm/dry Patient not depressed No peripheral clubbing Back-normal HEENT-normal/normal eyelids Neck supple/normal carotid upstroke bilaterally; no bruits; no JVD; no thyromegaly chest - CTA/ normal  expansion CV - RRR/normal S1 and S2; no  rubs or gallops; 1/6 systolic ejection murmur left sternal border. PMI nondisplaced Abdomen -NT/ND, no HSM, no mass, + bowel sounds, no bruit 2+ femoral pulses, no bruits Ext-no edema, chords, 2+ DP Neuro-grossly nonfocal     EKG  Procedure date:  12/05/2009  Findings:      Normal sinus rhythm, no ST changes.  EKG  Procedure date:  12/13/2009  Findings:      Sinus rhythm at a rate of 71. No ST changes.  Impression & Recommendations:  Problem # 1:  PALPITATIONS, OCCASIONAL (ICD-785.1) Etiology unclear. LV function normal. Recent TSH normal. I will add Toprol both for her palpitations and her blood pressure. If she has recurrent palpitations in the future we will add a CardioNet monitor. Her updated medication list for this problem includes:    Toprol Xl 50 Mg Xr24h-tab (Metoprolol succinate) .Marland Kitchen... Take 1 tablet daily  Orders: EKG w/ Interpretation (93000)  Her updated medication list for this problem includes:    Toprol Xl 50 Mg Xr24h-tab (Metoprolol succinate) .Marland Kitchen... Take 1 tablet daily  Problem # 2:  DYSPNEA (ICD-786.05) Not volume overloaded on examination. Possibly secondary to OSA/OHS. Her updated medication list for this problem includes:    Diovan Hct 80-12.5 Mg Tabs (Valsartan-hydrochlorothiazide) .Marland Kitchen... 1 by mouth once daily    Toprol Xl 50 Mg Xr24h-tab (Metoprolol succinate) .Marland Kitchen... Take 1 tablet daily  Problem # 3:  HYPERTENSION (ICD-401.9) Blood pressure elevated. Add Toprol 50 mg p.o. daily. Her updated medication list for this problem includes:    Diovan Hct 80-12.5 Mg Tabs (Valsartan-hydrochlorothiazide) .Marland Kitchen... 1 by mouth once daily    Toprol Xl 50 Mg Xr24h-tab (Metoprolol succinate) .Marland Kitchen... Take 1 tablet daily  Problem # 4:  ECHOCARDIOGRAM, ABNORMAL (ICD-793.2) Mildly elevated LVOT velocity. Aortic valve not well-visualized on recent echo. Repeat study in one year. Orders: EKG w/ Interpretation (93000)  Problem #  5:  CHEST PAIN (ICD-786.50) Mild tingling and chest does not sound cardiac. Previous Myoview negative. No further evaluation at this time. Her updated medication list for this problem includes:    Toprol Xl 50 Mg Xr24h-tab (Metoprolol succinate) .Marland Kitchen... Take 1 tablet daily  Problem # 6:  OBSTRUCTIVE SLEEP APNEA (ICD-327.23)  Problem # 7:  HYPOTHYROIDISM (ICD-244.9)  Her updated medication list for this problem includes:    Levothyroxine Sodium 88 Mcg Tabs (Levothyroxine sodium) .Marland Kitchen... Take one tablet daily  Patient Instructions: 1)  Your physician recommends that you schedule a follow-up appointment in: 6 months with Dr. Jens Som 2)  Your physician has recommended you make the following change in your medication: START Toprol 50mg  Take 1 tablet daily Prescriptions: TOPROL XL 50 MG XR24H-TAB (METOPROLOL SUCCINATE) Take 1 tablet daily  #30 x 11   Entered by:   Lisabeth Devoid RN   Authorized by:   Ferman Hamming, MD, [facility]   Signed by:   Lisabeth Devoid RN on 12/13/2009   Method used:   [facility]  [address] [id number]       [address]       [place], [place]  [address]  Ph: [phone] or [phone]       Fax: [phone]   RxID:   [id number]

## 2010-06-25 NOTE — Assessment & Plan Note (Signed)
Summary: 1 month roa//lch   Vital Signs:  Patient profile:   45 year old female Weight:      289.2 pounds BMI:     49.82 Temp:     98.5 degrees F oral BP sitting:   120 / 70  (left arm) Cuff size:   large  Vitals Entered By: Almeta Monas CMA Duncan Dull) (April 08, 2010 9:38 AM) CC: 1 mo f/u-- Weight check   History of Present Illness: Pt here for weight check.  Pt is exercising ,mostly chair exercises because it hurts too much to walk or do bike.   Pt is following diet with 5 small meals----she did have too much salt last night.-- She is still seeing the nutritionist at [facility].    Pt is seeing [facility] and is getting injections in back.    Current Medications (verified): 1)  Diovan Hct 80-12.5 Mg Tabs (Valsartan-Hydrochlorothiazide) .Marland Kitchen.. 1 By Mouth Once Daily 2)  Levothyroxine Sodium 88 Mcg  Tabs (Levothyroxine Sodium) .... Take One Tablet Daily 3)  Ferrous Sulfate 325 (65 Fe) Mg  Tbec (Ferrous Sulfate) .... Take By Mouth As Needed 4)  Claritin-D 12 Hour 5-120 Mg  Tb12 (Loratadine-Pseudoephedrine) .... Take By Mouth As Needed 5)  Nexium 40 Mg  Pack (Esomeprazole Magnesium) .Marland Kitchen.. 1 By Mouth Once Daily As Needed 6)  Nasonex 50 Mcg/act Susp (Mometasone Furoate) .... 2 Sprays Each Nostril Once Daily 7)  Cpap 10 .... At Bedtime 8)  Klor-Con M20 20 Meq Cr-Tabs (Potassium Chloride Crys Cr) .... Take One Tablet  By Mouth Once Daily. 9)  Zyrtec Allergy 10 Mg Tabs (Cetirizine Hcl) .Marland Kitchen.. 1 By Mouth Once Daily 10)  Toprol Xl 100 Mg Xr24h-Tab (Metoprolol Succinate) .Marland Kitchen.. 1 By Mouth Once Daily 11)  Cyclobenzaprine Hcl 10 Mg Tabs (Cyclobenzaprine Hcl) .... Take 1 Tab At Bedtime As Needed 12)  Ultram 50 Mg Tabs (Tramadol Hcl) .Marland Kitchen.. 1 By Mouth Q6h As Needed  Allergies (verified): 1)  ! Penicillin  Past History:  Past Medical History: Last updated: 12/13/2009 HYPERTENSION  DIASTOLIC DYSFUNCTION   OBSTRUCTIVE SLEEP APNEA  IBS GERD GOITER, UNSPECIFIED  MORBID OBESITY  DIVERTICULOSIS,  COLON ANEMIA, IRON DEFICIENCY NEC  THROMBOCYTOSIS  Mixed connective tissue disease  Past Surgical History: Last updated: 11/22/2007 Lipoma removed upper back endometrial ablation 12/08  Family History: Last updated: 12/13/2009 Mother with unknown arrhythmia and breast cancer Father with hypertension and renal cell cancer  Social History: Last updated: 12/13/2009 Patient never smoked.  pt is married. Alcohol Use - no  Risk Factors: Alcohol Use: 0 (03/08/2010) Caffeine Use: 0 (03/08/2010) Diet: poor diet (03/08/2010) Exercise: yes (03/08/2010)  Risk Factors: Smoking Status: never (03/08/2010)  Family History: Reviewed history from 12/13/2009 and no changes required. Mother with unknown arrhythmia and breast cancer Father with hypertension and renal cell cancer  Social History: Reviewed history from 12/13/2009 and no changes required. Patient never smoked.  pt is married. Alcohol Use - no  Review of Systems      See HPI  Physical Exam  General:  Well-developed,well-nourished,in no acute distress; alert,appropriate and cooperative throughout examination Lungs:  Normal respiratory effort, chest expands symmetrically. Lungs are clear to auscultation, no crackles or wheezes. Heart:  normal rate and Grade  2 /6 systolic ejection murmur.   Extremities:  No clubbing, cyanosis, edema, or deformity noted with normal full range of motion of all joints.   Psych:  Cognition and judgment appear intact. Alert and cooperative with normal attention span and concentration. No apparent delusions, illusions,  hallucinations   Impression & Recommendations:  Problem # 1:  MORBID OBESITY (ICD-278.01)  con't with low carb diet and eating 5 small meals  Ht: 64 (01/09/2010)   Wt: 289.2 (04/08/2010)   BMI: 49.82 (04/08/2010)  Problem # 2:  HYPERTENSION (ICD-401.9)  Her updated medication list for this problem includes:    Diovan Hct 80-12.5 Mg Tabs (Valsartan-hydrochlorothiazide)  .Marland Kitchen... 1 by mouth once daily    Toprol Xl 100 Mg Xr24h-tab (Metoprolol succinate) .Marland Kitchen... 1 by mouth once daily  BP today: 120/70 Prior BP: 132/84 (03/08/2010)  Labs Reviewed: K+: 4.2 (12/05/2009) Creat: : 0.6 (12/05/2009)   Chol: 191 (07/05/2009)   HDL: 51.30 (07/05/2009)   LDL: 118 (07/05/2009)   TG: 107.0 (07/05/2009)  Problem # 3:  HIATAL HERNIA (ICD-553.3)  Problem # 4:  SPINAL STENOSIS, LUMBAR (ICD-724.02) Per ortho pain prohibits her from exercising --- she does what she can    Complete Medication List: 1)  Diovan Hct 80-12.5 Mg Tabs (Valsartan-hydrochlorothiazide) .Marland Kitchen.. 1 by mouth once daily 2)  Levothyroxine Sodium 88 Mcg Tabs (Levothyroxine sodium) .... Take one tablet daily 3)  Ferrous Sulfate 325 (65 Fe) Mg Tbec (Ferrous sulfate) .... Take by mouth as needed 4)  Claritin-d 12 Hour 5-120 Mg Tb12 (Loratadine-pseudoephedrine) .... Take by mouth as needed 5)  Nexium 40 Mg Pack (Esomeprazole magnesium) .Marland Kitchen.. 1 by mouth once daily as needed 6)  Nasonex 50 Mcg/act Susp (Mometasone furoate) .... 2 sprays each nostril once daily 7)  Cpap 10  .... At bedtime 8)  Klor-con M20 20 Meq Cr-tabs (Potassium chloride crys cr) .... Take one tablet  by mouth once daily. 9)  Zyrtec Allergy 10 Mg Tabs (Cetirizine hcl) .Marland Kitchen.. 1 by mouth once daily 10)  Toprol Xl 100 Mg Xr24h-tab (Metoprolol succinate) .Marland Kitchen.. 1 by mouth once daily 11)  Cyclobenzaprine Hcl 10 Mg Tabs (Cyclobenzaprine hcl) .... Take 1 tab at bedtime as needed 12)  Ultram 50 Mg Tabs (Tramadol hcl) .Marland Kitchen.. 1 by mouth q6h as needed  Patient Instructions: 1)  Please schedule a follow-up appointment in 1 month.    Orders Added: 1)  Est. Patient Level III [29562]

## 2010-06-25 NOTE — Progress Notes (Signed)
Summary: Lab Results   Phone Note Outgoing Call   Call placed by: Army Fossa CMA,  July 11, 2009 9:56 AM Summary of Call: Regarding lab results, LMTCB:  RA and ESR only slightly elevated---nonspecific Alk phos elevated---repeat fractionated Alk phos---790.4 Signed by Loreen Freud DO on 07/08/2009 at 4:41 PM ANA positive but weakly +---  however if pt with significant joint pains etc---refer to rheum Signed by Loreen Freud DO on 07/09/2009 at 8:44 PM  Follow-up for Phone Call        LMTCB. Army Fossa CMA  July 12, 2009 1:51 PM   Additional Follow-up for Phone Call Additional follow up Details #1::        Pt would like to see rheum- what dx code? Army Fossa CMA  July 12, 2009 2:42 PM   New Problems: PAIN IN JOINT, MULTIPLE SITES [phone])   Additional Follow-up for Phone Call Additional follow up Details #2::    referral done Follow-up by: Loreen Freud DO,  July 12, 2009 3:30 PM  New Problems: PAIN IN JOINT, MULTIPLE SITES (ICD-719.49)

## 2010-06-25 NOTE — Letter (Signed)
Summary: [facility]  [facility]   Imported By: Lanelle Bal 02/21/2010 10:48:47  _____________________________________________________________________  External Attachment:    Type:   Image     Comment:   External Document

## 2010-06-25 NOTE — Progress Notes (Signed)
Summary: Not enough information  Phone Note Call from Patient Call back at Home Phone [phone] Call back at Work Phone [phone]   Caller: Patient Summary of Call: PT states that note from july is not specific enough and all OV notes need to include the following documentation(letter placed on ledge) in order for insurance to pay for surgery. Pt would like for you to update info that was given in July OV note. Pt states that she is currently walking for exercise and is following the flat belly diet along with the recommendation of the nutritionist.............Marland KitchenFelecia Deloach CMA  March 11, 2010 11:37 AM   Follow-up for Phone Call        I wrote flat belly diet for diet in 11/2009--- I put an addendum for exercise I m not sure what else they want ( I did look at letter from [facility].) Follow-up by: Loreen Freud DO,  March 11, 2010 11:54 AM  Additional Follow-up for Phone Call Additional follow up Details #1::        Spoke with pt advise that addendum OV note has been faxed and if any further detail are needed let us know. Pt ok will contact insurance to see if this is all she needs and will be back in touch if she needs to................Marland KitchenFelecia Deloach CMA  March 12, 2010 11:39 AM     -

## 2010-06-25 NOTE — Assessment & Plan Note (Signed)
Summary: weight check/cbs   Vital Signs:  Patient profile:   45 year old female Weight:      286.6 pounds Temp:     98.0 degrees F oral Pulse rate:   72 / minute Pulse rhythm:   regular BP sitting:   132 / 84  (left arm) Cuff size:   large  Vitals Entered By: Almeta Monas CMA Duncan Dull) (March 08, 2010 9:48 AM) CC: weight check   History of Present Illness: Pt here for weight check . Pt is awaiting bariatric surgery.    Pt is following the diet from the nutritionist at [facility].  She started exercising on the treadmill---she did 1/2 mile in 9 min last night.  She will slowly increase time.   Pt also c/o ears feeling full.  She is not taking any otc.     Preventive Screening-Counseling & Management  Alcohol-Tobacco     Alcohol drinks/day: 0     Smoking Status: never  Caffeine-Diet-Exercise     Caffeine use/day: 0     Diet Comments: poor diet     Nutrition Referrals: yes     Does Patient Exercise: yes     Type of exercise: water aerobics and weights     Exercise (avg: min/session): 30-60     Times/week: <3  Hep-HIV-STD-Contraception     Dental Visit-last 6 months yes     Dental Care Counseling: not indicated; dental care within six months     SBE monthly: yes  Safety-Violence-Falls     Seat Belt Use: yes      Sexual History:  currently monogamous.    Current Medications (verified): 1)  Diovan Hct 80-12.5 Mg Tabs (Valsartan-Hydrochlorothiazide) .Marland Kitchen.. 1 By Mouth Once Daily 2)  Levothyroxine Sodium 88 Mcg  Tabs (Levothyroxine Sodium) .... Take One Tablet Daily 3)  Ferrous Sulfate 325 (65 Fe) Mg  Tbec (Ferrous Sulfate) .... Take By Mouth As Needed 4)  Claritin-D 12 Hour 5-120 Mg  Tb12 (Loratadine-Pseudoephedrine) .... Take By Mouth As Needed 5)  Nexium 40 Mg  Pack (Esomeprazole Magnesium) .Marland Kitchen.. 1 By Mouth Once Daily As Needed 6)  Veramyst 27.5 Mcg/spray  Susp (Fluticasone Furoate) .... 2 Sprays Each Nostril Once Daily As Needed 7)  Cpap 10 .... At Bedtime 8)  Klor-Con M20  20 Meq Cr-Tabs (Potassium Chloride Crys Cr) .... Take One Tablet  By Mouth Once Daily. 9)  Zyrtec Allergy 10 Mg Tabs (Cetirizine Hcl) .Marland Kitchen.. 1 By Mouth Once Daily 10)  Toprol Xl 100 Mg Xr24h-Tab (Metoprolol Succinate) .Marland Kitchen.. 1 By Mouth Once Daily 11)  Cyclobenzaprine Hcl 10 Mg Tabs (Cyclobenzaprine Hcl) .... Take 1 Tab At Bedtime As Needed 12)  Ultram 50 Mg Tabs (Tramadol Hcl) .Marland Kitchen.. 1 By Mouth Q6h As Needed  Allergies (verified): 1)  ! Penicillin  Past History:  Past Medical History: Last updated: 12/13/2009 HYPERTENSION  DIASTOLIC DYSFUNCTION   OBSTRUCTIVE SLEEP APNEA  IBS GERD GOITER, UNSPECIFIED  MORBID OBESITY  DIVERTICULOSIS, COLON ANEMIA, IRON DEFICIENCY NEC  THROMBOCYTOSIS  Mixed connective tissue disease  Past Surgical History: Last updated: 11/22/2007 Lipoma removed upper back endometrial ablation 12/08  Family History: Last updated: 12/13/2009 Mother with unknown arrhythmia and breast cancer Father with hypertension and renal cell cancer  Social History: Last updated: 12/13/2009 Patient never smoked.  pt is married. Alcohol Use - no  Risk Factors: Alcohol Use: 0 (03/08/2010) Caffeine Use: 0 (03/08/2010) Diet: poor diet (03/08/2010) Exercise: yes (03/08/2010)  Risk Factors: Smoking Status: never (03/08/2010)  Family History: Reviewed history from 12/13/2009  and no changes required. Mother with unknown arrhythmia and breast cancer Father with hypertension and renal cell cancer  Social History: Reviewed history from 12/13/2009 and no changes required. Patient never smoked.  pt is married. Alcohol Use - no Sexual History:  currently monogamous  Review of Systems      See HPI  Physical Exam  General:  Well-developed,well-nourished,in no acute distress; alert,appropriate and cooperative throughout examinationoverweight-appearing.   Ears:  dull b/l  Nose:  External nasal examination shows no deformity or inflammation. Nasal mucosa are pink and moist  without lesions or exudates. Mouth:  Oral mucosa and oropharynx without lesions or exudates.  Teeth in good repair. Neck:  No deformities, masses, or tenderness noted. Lungs:  Normal respiratory effort, chest expands symmetrically. Lungs are clear to auscultation, no crackles or wheezes. Heart:  Normal rate and regular rhythm. S1 and S2 normal without gallop, murmur, click, rub or other extra sounds.   Impression & Recommendations:  Problem # 1:  ALLERGIC RHINITIS (ICD-477.9)  Her updated medication list for this problem includes:    Nasonex 50 Mcg/act Susp (Mometasone furoate) .Marland Kitchen... 2 sprays each nostril once daily    Zyrtec Allergy 10 Mg Tabs (Cetirizine hcl) .Marland Kitchen... 1 by mouth once daily  Discussed use of allergy medications and environmental measures.   Problem # 2:  MORBID OBESITY (ICD-278.01) con't inc exercise  con't diet rto 1 month Ht: 64 (01/09/2010)   Wt: 286.6 (03/08/2010)   BMI: 49.27 (02/08/2010)  Problem # 3:  HIATAL HERNIA (ICD-553.3) pt to use prilosec or prevacid  as needed   Complete Medication List: 1)  Diovan Hct 80-12.5 Mg Tabs (Valsartan-hydrochlorothiazide) .Marland Kitchen.. 1 by mouth once daily 2)  Levothyroxine Sodium 88 Mcg Tabs (Levothyroxine sodium) .... Take one tablet daily 3)  Ferrous Sulfate 325 (65 Fe) Mg Tbec (Ferrous sulfate) .... Take by mouth as needed 4)  Claritin-d 12 Hour 5-120 Mg Tb12 (Loratadine-pseudoephedrine) .... Take by mouth as needed 5)  Nexium 40 Mg Pack (Esomeprazole magnesium) .Marland Kitchen.. 1 by mouth once daily as needed 6)  Nasonex 50 Mcg/act Susp (Mometasone furoate) .... 2 sprays each nostril once daily 7)  Cpap 10  .... At bedtime 8)  Klor-con M20 20 Meq Cr-tabs (Potassium chloride crys cr) .... Take one tablet  by mouth once daily. 9)  Zyrtec Allergy 10 Mg Tabs (Cetirizine hcl) .Marland Kitchen.. 1 by mouth once daily 10)  Toprol Xl 100 Mg Xr24h-tab (Metoprolol succinate) .Marland Kitchen.. 1 by mouth once daily 11)  Cyclobenzaprine Hcl 10 Mg Tabs (Cyclobenzaprine hcl)  .... Take 1 tab at bedtime as needed 12)  Ultram 50 Mg Tabs (Tramadol hcl) .Marland Kitchen.. 1 by mouth q6h as needed  Patient Instructions: 1)  Please schedule a follow-up appointment in 1 month.  Prescriptions: ULTRAM 50 MG TABS (TRAMADOL HCL) 1 by mouth q6h as needed  #60 x 1   Entered and Authorized by:   Loreen Freud DO   Signed by:   Loreen Freud DO on 03/08/2010   Method used:   [facility]  [address] [id number]       [address]       [place], [place]  [address]       Ph: [phone] or [phone]       Fax: [phone]   RxID:   [id number]

## 2010-06-25 NOTE — Letter (Signed)
Summary: [facility]  [facility]   Imported By: Lanelle Bal 12/12/2009 09:55:50  _____________________________________________________________________  External Attachment:    Type:   Image     Comment:   External Document

## 2010-06-25 NOTE — Progress Notes (Signed)
Summary: PRIOR AUTH not needed med APPROVED per insurance  Phone Note Refill Request Message from:  Fax from Pharmacy on August 16, 2009 3:52 PM  Refills Requested: Medication #1:  DIOVAN HCT 80-12.5 MG TABS 1 by mouth once daily PRIOR AUTHOR [facility]X [phone][phone]   Method Requested: Fax to Local Pharmacy Next Appointment Scheduled: NO APPT  Follow-up for Phone Call        no prior auth needed pt was grandfathered in by new insurance company due to continuation of therapy ...............Marland KitchenFelecia Deloach CMA  August 17, 2009 11:59 AM

## 2010-06-25 NOTE — Assessment & Plan Note (Signed)
Summary: rov for osa   Primary Provider/Referring Provider:  Laury Axon  CC:  Pt is here for a 1 yr f/u appt on her OSA.   Pt states she is wearing her cpap machine every night.  Approx 6 hours per night.  Pt denied any new complaints with pressure.  Pt believes she is due for a new mask. Pt does states occ nasal dryness. Marland Kitchen  History of Present Illness: the pt comes in today for f/u of her known osa.  She has been wearing cpap compliantly, and reports no issues with cpap mask or pressure.  She is due for a new mask, and is having some nasal congestion issues.  She feels that she sleeps well, and reports no sleepiness issues during the day.   She is considering bariatric surgery.  Medications Prior to Update: 1)  Diovan Hct 80-12.5 Mg Tabs (Valsartan-Hydrochlorothiazide) .Marland Kitchen.. 1 By Mouth Once Daily 2)  Levothyroxine Sodium 88 Mcg  Tabs (Levothyroxine Sodium) .... Take One Tablet Daily 3)  Ferrous Sulfate 325 (65 Fe) Mg  Tbec (Ferrous Sulfate) .... Take By Mouth As Needed 4)  Claritin-D 12 Hour 5-120 Mg  Tb12 (Loratadine-Pseudoephedrine) .... Take By Mouth As Needed 5)  Nexium 40 Mg  Pack (Esomeprazole Magnesium) .Marland Kitchen.. 1 By Mouth Once Daily As Needed 6)  Veramyst 27.5 Mcg/spray  Susp (Fluticasone Furoate) .... 2 Sprays Each Nostril Once Daily As Needed 7)  Cpap 10 .... At Bedtime 8)  Klor-Con M20 20 Meq Cr-Tabs (Potassium Chloride Crys Cr) .... Take One Tablet  By Mouth Once Daily. 9)  Zyrtec Allergy 10 Mg Tabs (Cetirizine Hcl) .Marland Kitchen.. 1 By Mouth Once Daily 10)  Toprol Xl 50 Mg Xr24h-Tab (Metoprolol Succinate) .... Take 1 Tablet Daily  Allergies (verified): 1)  ! Penicillin  Review of Systems       The patient complains of headaches.  The patient denies shortness of breath with activity, shortness of breath at rest, productive cough, non-productive cough, coughing up blood, chest pain, irregular heartbeats, acid heartburn, indigestion, loss of appetite, weight change, abdominal pain, difficulty  swallowing, sore throat, tooth/dental problems, nasal congestion/difficulty breathing through nose, sneezing, itching, ear ache, anxiety, depression, hand/feet swelling, joint stiffness or pain, rash, change in color of mucus, and fever.    Vital Signs:  Patient profile:   45 year old female Height:      64 inches Weight:      290.25 pounds BMI:     50.00 O2 Sat:      99 % on Room air Temp:     98.3 degrees F oral Pulse rate:   67 / minute BP sitting:   140 / 70  (left arm) Cuff size:   large  Vitals Entered By: Arman Filter LPN (December 17, 2009 11:40 AM)  O2 Flow:  Room air CC: Pt is here for a 1 yr f/u appt on her OSA.   Pt states she is wearing her cpap machine every night.  Approx 6 hours per night.  Pt denied any new complaints with pressure.  Pt believes she is due for a new mask. Pt does states occ nasal dryness.  Comments Medications reviewed with patient Arman Filter LPN  December 17, 2009 11:40 AM    Physical Exam  General:  obese female in nad Nose:  no skin breakdown or pressure necrosis from cpap mask Extremities:  no edema or cyanosis  Neurologic:  alert and oriented, moves all 4.   Impression & Recommendations:  Problem # 1:  OBSTRUCTIVE SLEEP APNEA (ICD-327.23)  the pt is compliant with cpap, and feels that she is doing well.  She is having no pressure issues, and is due for a new mask.  She is considering bariatric surgery over the coming months, and will let me know how it goes.  She is having a little bit more nasal dryness issues, and I have reminded her to turn up the heat on her humidifier to help with this.  Other Orders: Est. Patient Level III (16109)  Patient Instructions: 1)  continue to work on weight loss 2)  followup with me in one year.

## 2010-06-25 NOTE — Assessment & Plan Note (Signed)
Summary: RECHECK BP, TIRED, DOESNT FEEL WELL, NEEDS LABS///SPH   Vital Signs:  Patient profile:   45 year old female Weight:      287 pounds BMI:     49.44 Temp:     98.1 degrees F oral Pulse rate:   85 / minute Pulse rhythm:   regular BP sitting:   132 / 84  (left arm) Cuff size:   large  Vitals Entered By: Army Fossa CMA (July 05, 2009 9:55 AM) CC: Pt states she needs labwork (Potassium levels) check sinuses, BP check. , URI symptoms   History of Present Illness:       This is a 45 year old woman who presents with URI symptoms.  The symptoms began 2 months ago.  The patient complains of nasal congestion, purulent nasal discharge, dry cough, and sick contacts, but denies clear nasal discharge, sore throat, productive cough, and earache.  The patient denies fever, low-grade fever (<100.5 degrees), fever of 100.5-103 degrees, fever of 103.1-104 degrees, fever to >104 degrees, stiff neck, dyspnea, wheezing, rash, vomiting, diarrhea, use of an antipyretic, and response to antipyretic.  The patient also reports headache.  The patient denies itchy watery eyes, itchy throat, sneezing, seasonal symptoms, response to antihistamine, muscle aches, and severe fatigue.  The patient denies the following risk factors for Strep sinusitis: unilateral facial pain, unilateral nasal discharge, poor response to decongestant, double sickening, tooth pain, Strep exposure, tender adenopathy, and absence of cough.  Pt c/o b/L sinus pressure.  Pt using veramyst and zyrtec with little relief.  No fevers but + chills.    Pt also c/o fatigue and muscle aches that she feels is unrelated. Pt will make appoint at [facility] for ortho for back.   She would like labs today.    Hypertension follow-up      The patient also presents for Hypertension follow-up.  The patient complains of fatigue, but denies lightheadedness, urinary frequency, headaches, edema, impotence, and rash.  The patient denies the following  associated symptoms: chest pain, chest pressure, exercise intolerance, dyspnea, palpitations, syncope, leg edema, and pedal edema.  Compliance with medications (by patient report) has been near 100%.  The patient reports that dietary compliance has been fair.  The patient reports no exercise.  Adjunctive measures currently used by the patient include salt restriction.    Preventive Screening-Counseling & Management  Alcohol-Tobacco     Alcohol drinks/day: 0     Smoking Status: never  Caffeine-Diet-Exercise     Caffeine use/day: 0     Diet Comments: poor diet     Nutrition Referrals: yes     Does Patient Exercise: yes     Type of exercise: water aerobics and weights     Exercise (avg: min/session): 30-60     Times/week: <3  Current Medications (verified): 1)  Diovan Hct 80-12.5 Mg Tabs (Valsartan-Hydrochlorothiazide) .Marland Kitchen.. 1 By Mouth Once Daily 2)  Levothyroxine Sodium 88 Mcg  Tabs (Levothyroxine Sodium) .... Take One Tablet Daily 3)  Ferrous Sulfate 325 (65 Fe) Mg  Tbec (Ferrous Sulfate) .... Take By Mouth As Needed 4)  Claritin-D 12 Hour 5-120 Mg  Tb12 (Loratadine-Pseudoephedrine) .... Take By Mouth As Needed 5)  Nexium 40 Mg  Pack (Esomeprazole Magnesium) .Marland Kitchen.. 1 By Mouth Once Daily As Needed 6)  Veramyst 27.5 Mcg/spray  Susp (Fluticasone Furoate) .... 2 Sprays Each Nostril Once Daily As Needed 7)  Cpap 10 .... At Bedtime 8)  Klor-Con M20 20 Meq Cr-Tabs (Potassium Chloride Crys Cr) .... Take  One Tablet  By Mouth Once Daily. 9)  Zyrtec Allergy 10 Mg Tabs (Cetirizine Hcl) .Marland Kitchen.. 1 By Mouth Once Daily 10)  Biaxin Xl 500 Mg Xr24h-Tab (Clarithromycin) .... 2 By Mouth Once Daily  Allergies: 1)  ! Penicillin  Past History:  Past medical, surgical, family and social histories (including risk factors) reviewed for relevance to current acute and chronic problems.  Past Medical History: Reviewed history from 12/08/2007 and no changes required. Current Problems:  GOITER, UNSPECIFIED  (ICD-240.9) SLEEP APNEA (ICD-780.57) MORBID OBESITY (ICD-278.01) ANEMIA (ICD-285.9) DIVERTICULOSIS, COLON (ICD-562.10) RHINOSINUSITIS, ALLERGIC (ICD-477.9) HEADACHE (ICD-784.0) SINUSITIS (ICD-473.9) CHEST PAIN (ICD-786.50) ANEMIA, IRON DEFICIENCY NEC (ICD-280.8) THROMBOCYTOSIS (ICD-289.9) LOW BACK PAIN (ICD-724.2) HYPOTHYROIDISM (ICD-244.9) HYPERTENSION (ICD-401.9)    Past Surgical History: Reviewed history from 11/22/2007 and no changes required. Lipoma removed upper back endometrial ablation 12/08  Family History: Reviewed history from 12/08/2007 and no changes required. heart disease: mother  Social History: Reviewed history from 12/08/2007 and no changes required. Patient never smoked.  pt is married.  Review of Systems      See HPI  Physical Exam  General:  Well-developed,well-nourished,in no acute distress; alert,appropriate and cooperative throughout examination Ears:  External ear exam shows no significant lesions or deformities.  Otoscopic examination reveals clear canals, tympanic membranes are intact bilaterally without bulging, retraction, inflammation or discharge. Hearing is grossly normal bilaterally. Nose:  External nasal examination shows no deformity or inflammation. Nasal mucosa are pink and moist without lesions or exudates. Mouth:  Oral mucosa and oropharynx without lesions or exudates.  Teeth in good repair. Neck:  No deformities, masses, or tenderness noted. Lungs:  Normal respiratory effort, chest expands symmetrically. Lungs are clear to auscultation, no crackles or wheezes. Heart:  normal rate and no murmur.   Extremities:  No clubbing, cyanosis, edema, or deformity noted with normal full range of motion of all joints.   Skin:  Intact without suspicious lesions or rashes Cervical Nodes:  No lymphadenopathy noted Psych:  Cognition and judgment appear intact. Alert and cooperative with normal attention span and concentration. No apparent delusions,  illusions, hallucinations   Impression & Recommendations:  Problem # 1:  MALAISE AND FATIGUE (ICD-780.79)  Orders: TLB-Rheumatoid Factor (RA) (16109-UE) TLB-Sedimentation Rate (ESR) (85652-ESR) TLB-T3, Free (Triiodothyronine) (84481-T3FREE) TLB-T4 (Thyrox), Free [phone]) T-Antinuclear Antib (ANA) (14782-95621)  Problem # 2:  GOITER, UNSPECIFIED (ICD-240.9)  Orders: Venipuncture (30865) TLB-B12 + Folate Pnl (78469_62952-W41/LKG) TLB-IBC Pnl (Iron/FE;Transferrin) (83550-IBC) TLB-Lipid Panel (80061-LIPID) TLB-BMP (Basic Metabolic Panel-BMET) (80048-METABOL) TLB-CBC Platelet - w/Differential (85025-CBCD) TLB-Hepatic/Liver Function Pnl (80076-HEPATIC) TLB-TSH (Thyroid Stimulating Hormone) (84443-TSH)  Problem # 3:  ANEMIA (ICD-285.9)  Her updated medication list for this problem includes:    Ferrous Sulfate 325 (65 Fe) Mg Tbec (Ferrous sulfate) .Marland Kitchen... Take by mouth as needed  Hgb: 13.5 (02/07/2009)   Hct: 41.0 (02/07/2009)   Platelets: 459.0 (02/07/2009) RBC: 4.48 (02/07/2009)   RDW: 13.7 (02/07/2009)   WBC: 7.4 (02/07/2009) MCV: 91.5 (02/07/2009)   MCHC: 33.0 (02/07/2009) Ferritin: 8.7 (12/23/2006) Iron: 64 (09/05/2008)   % Sat: 20.3 (09/05/2008) B12: 421 (02/07/2009)   Folate: 12.8 (02/07/2009)   TSH: 1.56 (02/07/2009)  Problem # 4:  HYPOTHYROIDISM (ICD-244.9)  Her updated medication list for this problem includes:    Levothyroxine Sodium 88 Mcg Tabs (Levothyroxine sodium) .Marland Kitchen... Take one tablet daily  Orders: Venipuncture (40102) TLB-B12 + Folate Pnl (72536_64403-K74/QVZ) TLB-IBC Pnl (Iron/FE;Transferrin) (83550-IBC) TLB-Lipid Panel (80061-LIPID) TLB-BMP (Basic Metabolic Panel-BMET) (80048-METABOL) TLB-CBC Platelet - w/Differential (85025-CBCD) TLB-Hepatic/Liver Function Pnl (80076-HEPATIC) TLB-TSH (Thyroid Stimulating Hormone) (84443-TSH)  Labs Reviewed: TSH:  1.56 (02/07/2009)    Chol: 184 (02/07/2009)   HDL: 42.80 (02/07/2009)   LDL: 122 (02/07/2009)   TG:  96.0 (02/07/2009)  Problem # 5:  HYPERTENSION (ICD-401.9)  Her updated medication list for this problem includes:    Diovan Hct 80-12.5 Mg Tabs (Valsartan-hydrochlorothiazide) .Marland Kitchen... 1 by mouth once daily  Orders: Venipuncture (16109) TLB-B12 + Folate Pnl (60454_09811-B14/NWG) TLB-IBC Pnl (Iron/FE;Transferrin) (83550-IBC) TLB-Lipid Panel (80061-LIPID) TLB-BMP (Basic Metabolic Panel-BMET) (80048-METABOL) TLB-CBC Platelet - w/Differential (85025-CBCD) TLB-Hepatic/Liver Function Pnl (80076-HEPATIC) TLB-TSH (Thyroid Stimulating Hormone) (84443-TSH)  BP today: 132/84 Prior BP: 132/84 (02/07/2009)  Labs Reviewed: K+: 4.0 (02/07/2009) Creat: : 0.7 (02/07/2009)   Chol: 184 (02/07/2009)   HDL: 42.80 (02/07/2009)   LDL: 122 (02/07/2009)   TG: 96.0 (02/07/2009)  Complete Medication List: 1)  Diovan Hct 80-12.5 Mg Tabs (Valsartan-hydrochlorothiazide) .Marland Kitchen.. 1 by mouth once daily 2)  Levothyroxine Sodium 88 Mcg Tabs (Levothyroxine sodium) .... Take one tablet daily 3)  Ferrous Sulfate 325 (65 Fe) Mg Tbec (Ferrous sulfate) .... Take by mouth as needed 4)  Claritin-d 12 Hour 5-120 Mg Tb12 (Loratadine-pseudoephedrine) .... Take by mouth as needed 5)  Nexium 40 Mg Pack (Esomeprazole magnesium) .Marland Kitchen.. 1 by mouth once daily as needed 6)  Veramyst 27.5 Mcg/spray Susp (Fluticasone furoate) .... 2 sprays each nostril once daily as needed 7)  Cpap 10  .... At bedtime 8)  Klor-con M20 20 Meq Cr-tabs (Potassium chloride crys cr) .... Take one tablet  by mouth once daily. 9)  Zyrtec Allergy 10 Mg Tabs (Cetirizine hcl) .Marland Kitchen.. 1 by mouth once daily 10)  Biaxin Xl 500 Mg Xr24h-tab (Clarithromycin) .... 2 by mouth once daily Prescriptions: VERAMYST 27.5 MCG/SPRAY  SUSP (FLUTICASONE FUROATE) 2 sprays each nostril once daily as needed  #01 x 5   Entered and Authorized by:   Loreen Freud DO   Signed by:   Loreen Freud DO on 07/05/2009   Method used:   Electronically to        [facility]  [address] [id number] (retail)        [address]       [place], [place]  [address]       Ph: [phone] or [phone]       Fax: [phone]   RxID:   [id number] KLOR-CON M20 20 MEQ CR-TABS (POTASSIUM CHLORIDE CRYS CR) TAKE ONE TABLET  BY MOUTH ONCE DAILY.  #30 x 2   Entered and Authorized by:   Loreen Freud DO   Signed by:   Loreen Freud DO on 07/05/2009   Method used:   Electronically to        [facility]  [address] [id number] (retail)       [address]       [place], [place]  [address]       Ph: [phone] or [phone]       Fax: [phone]   RxID:   [id number] BIAXIN XL 500 MG XR24H-TAB (CLARITHROMYCIN) 2 by mouth once daily  #28 x 0   Entered and Authorized by:   Loreen Freud DO   Signed by:   Loreen Freud DO on 07/05/2009   Method used:   [facility]ically to        [facility]  [address] [id number] ([facility]       [address]       [place], [place]  [address]  Ph: [phone] or [phone]       Fax: [phone]   RxID:   [id number]

## 2010-06-25 NOTE — Assessment & Plan Note (Signed)
Summary: weight check/cbs   Vital Signs:  Patient profile:   45 year old female Weight:      287 pounds BMI:     49.27 Temp:     98.5 degrees F oral BP sitting:   130 / 78  (left arm)  Vitals Entered By: Almeta Monas CMA Duncan Dull) (February 08, 2010 9:38 AM) CC: weight check   History of Present Illness: Pt here for weight check and to discuss diet and exericise.  Pt is trying to get back on track with diet and exercise ---pt has been so tired and in pain with hip so ortho gave her injection in hip.  Nutritionist is having her eat 4-5 times a day.  Pt is seeing psych next week.   Pt is able to exercise a little bit since injection.    Current Medications (verified): 1)  Diovan Hct 80-12.5 Mg Tabs (Valsartan-Hydrochlorothiazide) .Marland Kitchen.. 1 By Mouth Once Daily 2)  Levothyroxine Sodium 88 Mcg  Tabs (Levothyroxine Sodium) .... Take One Tablet Daily 3)  Ferrous Sulfate 325 (65 Fe) Mg  Tbec (Ferrous Sulfate) .... Take By Mouth As Needed 4)  Claritin-D 12 Hour 5-120 Mg  Tb12 (Loratadine-Pseudoephedrine) .... Take By Mouth As Needed 5)  Nexium 40 Mg  Pack (Esomeprazole Magnesium) .Marland Kitchen.. 1 By Mouth Once Daily As Needed 6)  Veramyst 27.5 Mcg/spray  Susp (Fluticasone Furoate) .... 2 Sprays Each Nostril Once Daily As Needed 7)  Cpap 10 .... At Bedtime 8)  Klor-Con M20 20 Meq Cr-Tabs (Potassium Chloride Crys Cr) .... Take One Tablet  By Mouth Once Daily. 9)  Zyrtec Allergy 10 Mg Tabs (Cetirizine Hcl) .Marland Kitchen.. 1 By Mouth Once Daily 10)  Toprol Xl 100 Mg Xr24h-Tab (Metoprolol Succinate) .Marland Kitchen.. 1 By Mouth Once Daily 11)  Cyclobenzaprine Hcl 10 Mg Tabs (Cyclobenzaprine Hcl) .... Take 1 Tab At Bedtime As Needed 12)  Ultram 50 Mg Tabs (Tramadol Hcl) .Marland Kitchen.. 1 By Mouth Q6h As Needed  Allergies (verified): 1)  ! Penicillin  Physical Exam  General:  Well-developed,well-nourished,in no acute distress; alert,appropriate and cooperative throughout examination Lungs:  Normal respiratory effort, chest expands  symmetrically. Lungs are clear to auscultation, no crackles or wheezes. Heart:  normal rate and Grade  2 /6 systolic ejection murmur.   Extremities:  trace left pedal edema and trace right pedal edema.   Skin:  Intact without suspicious lesions or rashes Psych:  Cognition and judgment appear intact. Alert and cooperative with normal attention span and concentration. No apparent delusions, illusions, hallucinations   Impression & Recommendations:  Problem # 1:  MORBID OBESITY (ICD-278.01) con't diet and exercise con't with nutritionist and flat belly diet rto 1 month Ht: 64 (01/09/2010)   Wt: 286 (02/08/2010)   BMI: 49.27 (02/08/2010)  Problem # 2:  HYPERTENSION (ICD-401.9)  Her updated medication list for this problem includes:    Diovan Hct 80-12.5 Mg Tabs (Valsartan-hydrochlorothiazide) .Marland Kitchen... 1 by mouth once daily    Toprol Xl 100 Mg Xr24h-tab (Metoprolol succinate) .Marland Kitchen... 1 by mouth once daily  BP today: 130/78 Prior BP: 140/82 (01/09/2010)  Labs Reviewed: K+: 4.2 (12/05/2009) Creat: : 0.6 (12/05/2009)   Chol: 191 (07/05/2009)   HDL: 51.30 (07/05/2009)   LDL: 118 (07/05/2009)   TG: 107.0 (07/05/2009)  Problem # 3:  HYPOTHYROIDISM (ICD-244.9)  Her updated medication list for this problem includes:    Levothyroxine Sodium 88 Mcg Tabs (Levothyroxine sodium) .Marland Kitchen... Take one tablet daily  Labs Reviewed: TSH: 1.53 (12/05/2009)    Chol: 191 (07/05/2009)  HDL: 51.30 (07/05/2009)   LDL: 118 (07/05/2009)   TG: 107.0 (07/05/2009)  Complete Medication List: 1)  Diovan Hct 80-12.5 Mg Tabs (Valsartan-hydrochlorothiazide) .Marland Kitchen.. 1 by mouth once daily 2)  Levothyroxine Sodium 88 Mcg Tabs (Levothyroxine sodium) .... Take one tablet daily 3)  Ferrous Sulfate 325 (65 Fe) Mg Tbec (Ferrous sulfate) .... Take by mouth as needed 4)  Claritin-d 12 Hour 5-120 Mg Tb12 (Loratadine-pseudoephedrine) .... Take by mouth as needed 5)  Nexium 40 Mg Pack (Esomeprazole magnesium) .Marland Kitchen.. 1 by mouth once daily as  needed 6)  Veramyst 27.5 Mcg/spray Susp (Fluticasone furoate) .... 2 sprays each nostril once daily as needed 7)  Cpap 10  .... At bedtime 8)  Klor-con M20 20 Meq Cr-tabs (Potassium chloride crys cr) .... Take one tablet  by mouth once daily. 9)  Zyrtec Allergy 10 Mg Tabs (Cetirizine hcl) .Marland Kitchen.. 1 by mouth once daily 10)  Toprol Xl 100 Mg Xr24h-tab (Metoprolol succinate) .Marland Kitchen.. 1 by mouth once daily 11)  Cyclobenzaprine Hcl 10 Mg Tabs (Cyclobenzaprine hcl) .... Take 1 tab at bedtime as needed 12)  Ultram 50 Mg Tabs (Tramadol hcl) .Marland Kitchen.. 1 by mouth q6h as needed  Patient Instructions: 1)  Please schedule a follow-up appointment in 1 month.

## 2010-06-25 NOTE — Assessment & Plan Note (Signed)
Summary: rto 1 month/cbs   Vital Signs:  Patient profile:   45 year old female Height:      64 inches Weight:      286 pounds Temp:     98.4 degrees F oral Pulse rate:   68 / minute BP sitting:   140 / 82  (left arm)  Vitals Entered By: Jeremy Johann CMA (January 09, 2010 11:05 AM) CC: 1 MONTH    History of Present Illness: Pt here to discuss weight loss.   Pt has appointment with surgeon at [facility] for bariatric surgery and with psych.   Pt has a lot of back pain and is seeing specialist at [facility] as well.  She is scheduled for injection in September.   Pt has seen cardio and will f/u in 6months and she f/u Dr Shelle Iron in 1 year.    Current Medications (verified): 1)  Diovan Hct 80-12.5 Mg Tabs (Valsartan-Hydrochlorothiazide) .Marland Kitchen.. 1 By Mouth Once Daily 2)  Levothyroxine Sodium 88 Mcg  Tabs (Levothyroxine Sodium) .... Take One Tablet Daily 3)  Ferrous Sulfate 325 (65 Fe) Mg  Tbec (Ferrous Sulfate) .... Take By Mouth As Needed 4)  Claritin-D 12 Hour 5-120 Mg  Tb12 (Loratadine-Pseudoephedrine) .... Take By Mouth As Needed 5)  Nexium 40 Mg  Pack (Esomeprazole Magnesium) .Marland Kitchen.. 1 By Mouth Once Daily As Needed 6)  Veramyst 27.5 Mcg/spray  Susp (Fluticasone Furoate) .... 2 Sprays Each Nostril Once Daily As Needed 7)  Cpap 10 .... At Bedtime 8)  Klor-Con M20 20 Meq Cr-Tabs (Potassium Chloride Crys Cr) .... Take One Tablet  By Mouth Once Daily. 9)  Zyrtec Allergy 10 Mg Tabs (Cetirizine Hcl) .Marland Kitchen.. 1 By Mouth Once Daily 10)  Toprol Xl 100 Mg Xr24h-Tab (Metoprolol Succinate) .Marland Kitchen.. 1 By Mouth Once Daily 11)  Cyclobenzaprine Hcl 10 Mg Tabs (Cyclobenzaprine Hcl) .... Take 1 Tab At Bedtime As Needed 12)  Ultram 50 Mg Tabs (Tramadol Hcl) .Marland Kitchen.. 1 By Mouth Q6h As Needed  Allergies (verified): 1)  ! Penicillin  Past History:  Past Medical History: Last updated: 12/13/2009 HYPERTENSION  DIASTOLIC DYSFUNCTION   OBSTRUCTIVE SLEEP APNEA  IBS GERD GOITER, UNSPECIFIED  MORBID OBESITY  DIVERTICULOSIS,  COLON ANEMIA, IRON DEFICIENCY NEC  THROMBOCYTOSIS  Mixed connective tissue disease  Past Surgical History: Last updated: 11/22/2007 Lipoma removed upper back endometrial ablation 12/08  Family History: Last updated: 12/13/2009 Mother with unknown arrhythmia and breast cancer Father with hypertension and renal cell cancer  Social History: Last updated: 12/13/2009 Patient never smoked.  pt is married. Alcohol Use - no  Risk Factors: Alcohol Use: 0 (07/05/2009) Caffeine Use: 0 (07/05/2009) Diet: poor diet (07/05/2009) Exercise: yes (07/05/2009)  Risk Factors: Smoking Status: never (07/05/2009)  Family History: Reviewed history from 12/13/2009 and no changes required. Mother with unknown arrhythmia and breast cancer Father with hypertension and renal cell cancer  Social History: Reviewed history from 12/13/2009 and no changes required. Patient never smoked.  pt is married. Alcohol Use - no  Review of Systems      See HPI  Physical Exam  General:  Well-developed,well-nourished,in no acute distress; alert,appropriate and cooperative throughout examination Ears:  External ear exam shows no significant lesions or deformities.  Otoscopic examination reveals clear canals, tympanic membranes are intact bilaterally without bulging, retraction, inflammation or discharge. Hearing is grossly normal bilaterally. Nose:  External nasal examination shows no deformity or inflammation. Nasal mucosa are pink and moist without lesions or exudates. Mouth:  Oral mucosa and oropharynx without lesions or exudates.  Teeth in good repair. Lungs:  Normal respiratory effort, chest expands symmetrically. Lungs are clear to auscultation, no crackles or wheezes. Heart:  normal rate and no murmur.   Extremities:  No clubbing, cyanosis, edema, or deformity noted with normal full range of motion of all joints.   Skin:  Intact without suspicious lesions or rashes Cervical Nodes:  No lymphadenopathy  noted Psych:  Cognition and judgment appear intact. Alert and cooperative with normal attention span and concentration. No apparent delusions, illusions, hallucinations   Impression & Recommendations:  Problem # 1:  MORBID OBESITY (ICD-278.01) con't flat belly diet pt is unable to exercise secondary to severe back pain rto 1 month Ht: 64 (01/09/2010)   Wt: 286 (01/09/2010)   BMI: 50.00 (12/17/2009)  Problem # 2:  PALPITATIONS, OCCASIONAL (ICD-785.1) Assessment: Improved  Her updated medication list for this problem includes:    Toprol Xl 100 Mg Xr24h-tab (Metoprolol succinate) .Marland Kitchen... 1 by mouth once daily  Avoid caffeine, chocolate, and stimulants. Stress reduction as well as medication options discussed.   Problem # 3:  HYPERTENSION (ICD-401.9)  Her updated medication list for this problem includes:    Diovan Hct 80-12.5 Mg Tabs (Valsartan-hydrochlorothiazide) .Marland Kitchen... 1 by mouth once daily    Toprol Xl 100 Mg Xr24h-tab (Metoprolol succinate) .Marland Kitchen... 1 by mouth once daily  BP today: 140/82 Prior BP: 140/70 (12/17/2009)  Labs Reviewed: K+: 4.2 (12/05/2009) Creat: : 0.6 (12/05/2009)   Chol: 191 (07/05/2009)   HDL: 51.30 (07/05/2009)   LDL: 118 (07/05/2009)   TG: 107.0 (07/05/2009)  Problem # 4:  LOW BACK PAIN (ICD-724.2) Assessment: Deteriorated  per ortho at [facility] Her updated medication list for this problem includes:    Cyclobenzaprine Hcl 10 Mg Tabs (Cyclobenzaprine hcl) .Marland Kitchen... Take 1 tab at bedtime as needed    Ultram 50 Mg Tabs (Tramadol hcl) .Marland Kitchen... 1 by mouth q6h as needed  Discussed use of moist heat or ice, modified activities, medications, and stretching/strengthening exercises. Back care instructions given. To be seen in 2 weeks if no improvement; sooner if worsening of symptoms.   Problem # 5:  RHINITIS (ICD-477.9)  Her updated medication list for this problem includes:    Veramyst 27.5 Mcg/spray Susp (Fluticasone furoate) .Marland Kitchen... 2 sprays each nostril once daily as  needed    Zyrtec Allergy 10 Mg Tabs (Cetirizine hcl) .Marland Kitchen... 1 by mouth once daily  Complete Medication List: 1)  Diovan Hct 80-12.5 Mg Tabs (Valsartan-hydrochlorothiazide) .Marland Kitchen.. 1 by mouth once daily 2)  Levothyroxine Sodium 88 Mcg Tabs (Levothyroxine sodium) .... Take one tablet daily 3)  Ferrous Sulfate 325 (65 Fe) Mg Tbec (Ferrous sulfate) .... Take by mouth as needed 4)  Claritin-d 12 Hour 5-120 Mg Tb12 (Loratadine-pseudoephedrine) .... Take by mouth as needed 5)  Nexium 40 Mg Pack (Esomeprazole magnesium) .Marland Kitchen.. 1 by mouth once daily as needed 6)  Veramyst 27.5 Mcg/spray Susp (Fluticasone furoate) .... 2 sprays each nostril once daily as needed 7)  Cpap 10  .... At bedtime 8)  Klor-con M20 20 Meq Cr-tabs (Potassium chloride crys cr) .... Take one tablet  by mouth once daily. 9)  Zyrtec Allergy 10 Mg Tabs (Cetirizine hcl) .Marland Kitchen.. 1 by mouth once daily 10)  Toprol Xl 100 Mg Xr24h-tab (Metoprolol succinate) .Marland Kitchen.. 1 by mouth once daily 11)  Cyclobenzaprine Hcl 10 Mg Tabs (Cyclobenzaprine hcl) .... Take 1 tab at bedtime as needed 12)  Ultram 50 Mg Tabs (Tramadol hcl) .Marland Kitchen.. 1 by mouth q6h as needed  Patient Instructions: 1)  Please schedule  a follow-up appointment in 1 month.  Prescriptions: LEVOTHYROXINE SODIUM 88 MCG  TABS (LEVOTHYROXINE SODIUM) Take one tablet daily  #90 x 3   Entered and Authorized by:   Loreen Freud DO   Signed by:   Loreen Freud DO on 01/09/2010   Method used:   Electronically to        [facility]  [address] [id number] ([facility])       [address]       [place], [place]  [address]       Ph: [phone] or [phone]       Fax: [phone]   RxID:   [id number] ULTRAM 50 MG TABS (TRAMADOL HCL) 1 by mouth q6h as needed  #60 x 0   Entered and Authorized by:   Loreen Freud DO   Signed by:   Loreen Freud DO on 01/09/2010   Method used:   [facility]ically to        [facility]  [address] [id number] ([facility]       [address]       [place], [place]  [address]       Ph: [phone] or [phone]       Fax: [phone]   RxID:   [id number] TOPROL XL 100 MG XR24H-TAB (METOPROLOL SUCCINATE) 1 by mouth once daily  #30 x 2   Entered and Authorized by:   Loreen Freud DO   Signed by:   Loreen Freud DO on 01/09/2010   Method used:   [facility] to        [facility]  [address] [id number]       [address]       [place], [place]  [address]       Ph: [phone] or [phone]       Fax: [phone]   RxID:   [id number]

## 2010-06-25 NOTE — Assessment & Plan Note (Signed)
Summary: rto /lab/cbs   Vital Signs:  Patient profile:   45 year old female Height:      64 inches Weight:      289 pounds Temp:     98.0 degrees F oral Pulse rate:   72 / minute BP sitting:   110 / 86  (left arm)  Vitals Entered By: Jeremy Johann CMA (December 05, 2009 10:06 AM) CC: discuss weight loss, labs Comments REVIEWED MED LIST, PATIENT AGREED DOSE AND INSTRUCTION CORRECT    History of Present Illness: Pt here to discuss weight loss.  Pt is going to go through weight loss program for gastric bypass at [facility].   She needs 6 consecutive visits with a dr. pt also c/o palpatations on occassion that takes her breathe away.  Current Medications (verified): 1)  Diovan Hct 80-12.5 Mg Tabs (Valsartan-Hydrochlorothiazide) .Marland Kitchen.. 1 By Mouth Once Daily 2)  Levothyroxine Sodium 88 Mcg  Tabs (Levothyroxine Sodium) .... Take One Tablet Daily 3)  Ferrous Sulfate 325 (65 Fe) Mg  Tbec (Ferrous Sulfate) .... Take By Mouth As Needed 4)  Claritin-D 12 Hour 5-120 Mg  Tb12 (Loratadine-Pseudoephedrine) .... Take By Mouth As Needed 5)  Nexium 40 Mg  Pack (Esomeprazole Magnesium) .Marland Kitchen.. 1 By Mouth Once Daily As Needed 6)  Veramyst 27.5 Mcg/spray  Susp (Fluticasone Furoate) .... 2 Sprays Each Nostril Once Daily As Needed 7)  Cpap 10 .... At Bedtime 8)  Klor-Con M20 20 Meq Cr-Tabs (Potassium Chloride Crys Cr) .... Take One Tablet  By Mouth Once Daily. 9)  Zyrtec Allergy 10 Mg Tabs (Cetirizine Hcl) .Marland Kitchen.. 1 By Mouth Once Daily  Allergies: 1)  ! Penicillin  Past History:  Past medical, surgical, family and social histories (including risk factors) reviewed for relevance to current acute and chronic problems.  Past Medical History: Reviewed history from 12/08/2007 and no changes required. Current Problems:  GOITER, UNSPECIFIED (ICD-240.9) SLEEP APNEA (ICD-780.57) MORBID OBESITY (ICD-278.01) ANEMIA (ICD-285.9) DIVERTICULOSIS, COLON (ICD-562.10) RHINOSINUSITIS, ALLERGIC (ICD-477.9) HEADACHE  (ICD-784.0) SINUSITIS (ICD-473.9) CHEST PAIN (ICD-786.50) ANEMIA, IRON DEFICIENCY NEC (ICD-280.8) THROMBOCYTOSIS (ICD-289.9) LOW BACK PAIN (ICD-724.2) HYPOTHYROIDISM (ICD-244.9) HYPERTENSION (ICD-401.9)    Past Surgical History: Reviewed history from 11/22/2007 and no changes required. Lipoma removed upper back endometrial ablation 12/08  Family History: Reviewed history from 12/08/2007 and no changes required. heart disease: mother  Social History: Reviewed history from 12/08/2007 and no changes required. Patient never smoked.  pt is married.  Review of Systems      See HPI  Physical Exam  General:  Well-developed,well-nourished,in no acute distress; alert,appropriate and cooperative throughout examination Lungs:  Normal respiratory effort, chest expands symmetrically. Lungs are clear to auscultation, no crackles or wheezes. Heart:  Grade  2 /6 systolic ejection murmur.  normal rate and Grade   /6 systolic ejection murmur.   Abdomen:  Bowel sounds positive,abdomen soft and non-tender without masses, organomegaly or hernias noted.  obese Skin:  Intact without suspicious lesions or rashes Psych:  Cognition and judgment appear intact. Alert and cooperative with normal attention span and concentration. No apparent delusions, illusions, hallucinations   Impression & Recommendations:  Problem # 1:  PALPITATIONS, OCCASIONAL (ICD-785.1)  Orders: Venipuncture (54098) TLB-B12 + Folate Pnl (11914_78295-A21/HYQ) TLB-IBC Pnl (Iron/FE;Transferrin) (83550-IBC) TLB-BMP (Basic Metabolic Panel-BMET) (80048-METABOL) TLB-CBC Platelet - w/Differential (85025-CBCD) TLB-Hepatic/Liver Function Pnl (80076-HEPATIC) TLB-TSH (Thyroid Stimulating Hormone) (84443-TSH) TLB-T4 (Thyrox), Free [phone]) TLB-T3, Free (Triiodothyronine) (84481-T3FREE) EKG w/ Interpretation (93000)  Avoid caffeine, chocolate, and stimulants. Stress reduction as well as medication options discussed.   Problem #  2:  CARDIAC MURMUR (ICD-785.2)  Orders: Echo Referral (Echo) Venipuncture (04540) TLB-B12 + Folate Pnl ([id number]-N56/OZH) TLB-IBC Pnl (Iron/FE;Transferrin) (83550-IBC) TLB-BMP (Basic Metabolic Panel-BMET) (80048-METABOL) TLB-CBC Platelet - w/Differential (85025-CBCD) TLB-Hepatic/Liver Function Pnl (80076-HEPATIC) TLB-TSH (Thyroid Stimulating Hormone) (84443-TSH) TLB-T4 (Thyrox), Free [phone]) TLB-T3, Free (Triiodothyronine) (84481-T3FREE) EKG w/ Interpretation (93000)  EKG obtained (see interpretation); Will refer to cardiology for evaluation of this murmur.   Problem # 3:  ANEMIA (ICD-285.9)  Her updated medication list for this problem includes:    Ferrous Sulfate 325 (65 Fe) Mg Tbec (Ferrous sulfate) .Marland Kitchen... Take by mouth as needed  Hgb: 13.4 (07/05/2009)   Hct: 41.0 (07/05/2009)   Platelets: 432.0 (07/05/2009) RBC: 4.55 (07/05/2009)   RDW: 13.1 (07/05/2009)   WBC: 7.4 (07/05/2009) MCV: 90.1 (07/05/2009)   MCHC: 32.7 (07/05/2009) Ferritin: 8.7 (12/23/2006) Iron: 85 (07/05/2009)   % Sat: 26.7 (07/05/2009) B12: 334 (07/05/2009)   Folate: 13.4 (07/05/2009)   TSH: 1.89 (07/05/2009)  Orders: EKG w/ Interpretation (93000)  Problem # 4:  MORBID OBESITY (ICD-278.01) d/w pt Flat Belly Diet vs Weigh watchers exercise and water intake rto 1 month Orders: Venipuncture (62952) TLB-B12 + Folate Pnl (84132_44010-U72/ZDG) TLB-IBC Pnl (Iron/FE;Transferrin) (83550-IBC) TLB-BMP (Basic Metabolic Panel-BMET) (80048-METABOL) TLB-CBC Platelet - w/Differential (85025-CBCD) TLB-Hepatic/Liver Function Pnl (80076-HEPATIC) TLB-TSH (Thyroid Stimulating Hormone) (84443-TSH) TLB-T4 (Thyrox), Free [phone]) TLB-T3, Free (Triiodothyronine) (84481-T3FREE) EKG w/ Interpretation (93000)  Ht: 64 (12/05/2009)   Wt: 289 (12/05/2009)   BMI: 49.44 (07/05/2009)  Problem # 5:  HYPERTENSION (ICD-401.9)  Her updated medication list for this problem includes:    Diovan Hct 80-12.5 Mg Tabs  (Valsartan-hydrochlorothiazide) .Marland Kitchen... 1 by mouth once daily  Orders: Venipuncture (59563) TLB-B12 + Folate Pnl ([id number]-J88/CZY) TLB-IBC Pnl (Iron/FE;Transferrin) (83550-IBC) TLB-BMP (Basic Metabolic Panel-BMET) (80048-METABOL) TLB-CBC Platelet - w/Differential (85025-CBCD) TLB-Hepatic/Liver Function Pnl (80076-HEPATIC) TLB-TSH (Thyroid Stimulating Hormone) (84443-TSH) TLB-T4 (Thyrox), Free [phone] TLB-T3, Free (Triiodothyronine) (84481-T3FREE) EKG w/ Interpretation (93000)  BP today: 110/86 Prior BP: 132/84 (07/05/2009)  Labs Reviewed: K+: 3.9 (07/05/2009) Creat: : 0.6 (07/05/2009)   Chol: 191 (07/05/2009)   HDL: 51.30 (07/05/2009)   LDL: 118 (07/05/2009)   TG: 107.0 (07/05/2009)  Problem # 6:  HYPOTHYROIDISM (ICD-244.9)  Her updated medication list for this problem includes:    Levothyroxine Sodium 88 Mcg Tabs (Levothyroxine sodium) .Marland Kitchen... Take one tablet daily  Orders: Venipuncture (09323) TLB-B12 + Folate Pnl ([id number]-Y70/WCB) TLB-IBC Pnl (Iron/FE;Transferrin) (83550-IBC) TLB-BMP (Basic Metabolic Panel-BMET) (80048-METABOL) TLB-CBC Platelet - w/Differential (85025-CBCD) TLB-Hepatic/Liver Function Pnl (80076-HEPATIC) TLB-TSH (Thyroid Stimulating Hormone) (84443-TSH) TLB-T4 (Thyrox), Free [phone]) TLB-T3, Free (Triiodothyronine) (84481-T3FREE) EKG w/ Interpretation (93000)  Labs Reviewed: TSH: 1.89 (07/05/2009)    Chol: 191 (07/05/2009)   HDL: 51.30 (07/05/2009)   LDL: 118 (07/05/2009)   TG: 107.0 (07/05/2009)  Complete Medication List: 1)  Diovan Hct 80-12.5 Mg Tabs (Valsartan-hydrochlorothiazide) .Marland Kitchen.. 1 by mouth once daily 2)  Levothyroxine Sodium 88 Mcg Tabs (Levothyroxine sodium) .... Take one tablet daily 3)  Ferrous Sulfate 325 (65 Fe) Mg Tbec (Ferrous sulfate) .... Take by mouth as needed 4)  Claritin-d 12 Hour 5-120 Mg Tb12 (Loratadine-pseudoephedrine) .... Take by mouth as needed 5)  Nexium 40 Mg Pack (Esomeprazole magnesium) .Marland Kitchen.. 1 by mouth  once daily as needed 6)  Veramyst 27.5 Mcg/spray Susp (Fluticasone furoate) .... 2 sprays each nostril once daily as needed 7)  Cpap 10  .... At bedtime 8)  Klor-con M20 20 Meq Cr-tabs (Potassium chloride crys cr) .... Take one tablet  by mouth once daily. 9)  Zyrtec Allergy 10 Mg Tabs (Cetirizine hcl) .Marland KitchenMarland KitchenMarland Kitchen 1  by mouth once daily  Patient Instructions: 1)  Try Flat Belly Diet by prevention magazine  or Weight Watchers 2)  Start to walk if you can or water aerobics--- try to do something at least 3x a week for 30 min and build up from there. 3)  Drink 8-10 glasses of water a day 4)  Please schedule a follow-up appointment in 1 month.  Prescriptions: DIOVAN HCT 80-12.5 MG TABS (VALSARTAN-HYDROCHLOROTHIAZIDE) 1 by mouth once daily  #90 x 3   Entered and Authorized by:   Loreen Freud DO   Signed by:   Loreen Freud DO on 12/05/2009   Method used:   Print then Give to Patient   RxID:   [id number]   Appended Document: rto /lab/cbs discussed 8-10 glasses of water daily and working at increasing exercise to 30 min 5x a week to start.

## 2010-06-25 NOTE — Letter (Signed)
Summary: Primary Care Consult Scheduled Letter  [facility]  [address] [place], [place] [address]   Phone: [phone]  Fax: [phone]      07/19/2009 MRN: [id number]  [facility] [address] [place], [place]  [address]    Dear Ms. Gamero,    We have scheduled an appointment for you.  At the recommendation of Dr. Loreen Freud, we have scheduled you a consult with Dr. Mariah Milling of [facility] on 08-06-2009 at 1:15pm.  Their address is [address] [place] [address]. The office phone number is [phone].  If this appointment day and time is not convenient for you, please feel free to call the office of the doctor you are being referred to at the number listed above and reschedule the appointment.    It is important for you to keep your scheduled appointments. We are here to make sure you are given good patient care.   Thank you,    Renee, Patient Care Coordinator [place] [facility]    ***ACCORDING TO YOU MEDICAL INSURANCE PLAN, THE ABOVE MEDICAL PRACTICE IS THE CLOSEST "IN-NETWORK" PROVIDER WHO ACCEPTS YOUR INSURANCE***

## 2010-06-25 NOTE — Progress Notes (Signed)
Summary: Lab results  Phone Note Call from Patient Call back at Patient did not leave a call back number.  Try numbers on chart.   Caller: Patient Call For: Loreen Freud DO Summary of Call: Patient wants results of labwork done in July. Initial call taken by: Barnie Mort,  December 24, 2009 1:47 PM  Follow-up for Phone Call        left message with spouse for patient to call back to office tomorrow. Follow-up by: Lucious Groves CMA,  December 24, 2009 5:00 PM  Additional Follow-up for Phone Call Additional follow up Details #1::        left message to call  office re-lab result. labs printed and mailed to pt as well.Felecia Deloach CMA  December 26, 2009 11:56 AM     Additional Follow-up for Phone Call Additional follow up Details #2::    DISCUSS WITH PATIENT ....................Marland KitchenFelecia Deloach CMA  December 28, 2009 9:19 AM

## 2010-06-27 NOTE — Assessment & Plan Note (Signed)
Summary: rto 1 month/cbs   Vital Signs:  Patient profile:   45 year old female Weight:      285.6 pounds Pulse rate:   60 / minute Pulse rhythm:   regular BP sitting:   118 / 72  (left arm) Cuff size:   large  Vitals Entered By: Almeta Monas CMA Duncan Dull) (May 08, 2010 8:22 AM) CC: 63mo f/u--weight check   History of Present Illness: Pt here for weight check.  Pt has been able to move a little better this month.  Pt with hx lumbar stenosis so its been very difficult for her to exercise but she has found that if she does her exercise in am after she she is up and moving a  little it works better for her.   Pt is still trying to follow a combo of the nutritionist  diet and flat belly diet.  This is month 6 so the pt is excited to proceed with the surgery.     Preventive Screening-Counseling & Management  Alcohol-Tobacco     Alcohol drinks/day: 0     Smoking Status: never  Caffeine-Diet-Exercise     Caffeine use/day: 0     Diet Comments: poor diet     Nutrition Referrals: yes     Does Patient Exercise: yes     Type of exercise: water aerobics and weights     Exercise (avg: min/session): 30-60     Times/week: <3  Current Medications (verified): 1)  Diovan Hct 80-12.5 Mg Tabs (Valsartan-Hydrochlorothiazide) .Marland Kitchen.. 1 By Mouth Once Daily 2)  Levothyroxine Sodium 88 Mcg  Tabs (Levothyroxine Sodium) .... Take One Tablet Daily 3)  Ferrous Sulfate 325 (65 Fe) Mg  Tbec (Ferrous Sulfate) .... Take By Mouth As Needed 4)  Claritin-D 12 Hour 5-120 Mg  Tb12 (Loratadine-Pseudoephedrine) .... Take By Mouth As Needed 5)  Nexium 40 Mg  Pack (Esomeprazole Magnesium) .Marland Kitchen.. 1 By Mouth Once Daily As Needed 6)  Nasonex 50 Mcg/act Susp (Mometasone Furoate) .... 2 Sprays Each Nostril Once Daily 7)  Cpap 10 .... At Bedtime 8)  Klor-Con M20 20 Meq Cr-Tabs (Potassium Chloride Crys Cr) .... Take One Tablet  By Mouth Once Daily. 9)  Zyrtec Allergy 10 Mg Tabs (Cetirizine Hcl) .Marland Kitchen.. 1 By Mouth Once Daily 10)   Toprol Xl 100 Mg Xr24h-Tab (Metoprolol Succinate) .Marland Kitchen.. 1 By Mouth Once Daily 11)  Cyclobenzaprine Hcl 10 Mg Tabs (Cyclobenzaprine Hcl) .... Take 1 Tab At Bedtime As Needed 12)  Ultram 50 Mg Tabs (Tramadol Hcl) .Marland Kitchen.. 1 By Mouth Q6h As Needed  Allergies (verified): 1)  ! Penicillin  Past History:  Past Medical History: Last updated: 12/13/2009 HYPERTENSION  DIASTOLIC DYSFUNCTION   OBSTRUCTIVE SLEEP APNEA  IBS GERD GOITER, UNSPECIFIED  MORBID OBESITY  DIVERTICULOSIS, COLON ANEMIA, IRON DEFICIENCY NEC  THROMBOCYTOSIS  Mixed connective tissue disease  Past Surgical History: Last updated: 11/22/2007 Lipoma removed upper back endometrial ablation 12/08  Family History: Last updated: 12/13/2009 Mother with unknown arrhythmia and breast cancer Father with hypertension and renal cell cancer  Social History: Last updated: 12/13/2009 Patient never smoked.  pt is married. Alcohol Use - no  Risk Factors: Alcohol Use: 0 (05/08/2010) Caffeine Use: 0 (05/08/2010) Diet: poor diet (05/08/2010) Exercise: yes (05/08/2010)  Risk Factors: Smoking Status: never (05/08/2010)  Family History: Reviewed history from 12/13/2009 and no changes required. Mother with unknown arrhythmia and breast cancer Father with hypertension and renal cell cancer  Social History: Reviewed history from 12/13/2009 and no changes required. Patient never  smoked.  pt is married. Alcohol Use - no  Review of Systems      See HPI  Physical Exam  General:  Well-developed,well-nourished,in no acute distress; alert,appropriate and cooperative throughout examination Neck:  No deformities, masses, or tenderness noted. Lungs:  Normal respiratory effort, chest expands symmetrically. Lungs are clear to auscultation, no crackles or wheezes. Heart:  normal rate and Grade   2/6 systolic ejection murmur.   Extremities:  No clubbing, cyanosis, edema, or deformity noted with normal full range of motion of all  joints.   Skin:  Intact without suspicious lesions or rashes Cervical Nodes:  No lymphadenopathy noted Psych:  Cognition and judgment appear intact. Alert and cooperative with normal attention span and concentration. No apparent delusions, illusions, hallucinations   Impression & Recommendations:  Problem # 1:  SPINAL STENOSIS, LUMBAR (ICD-724.02) con't with weight loss con't care with ortho   Problem # 2:  HYPERTENSION (ICD-401.9)  Her updated medication list for this problem includes:    Diovan Hct 80-12.5 Mg Tabs (Valsartan-hydrochlorothiazide) .Marland Kitchen... 1 by mouth once daily    Toprol Xl 100 Mg Xr24h-tab (Metoprolol succinate) .Marland Kitchen... 1 by mouth once daily  BP today: 118/72 Prior BP: 120/70 (04/08/2010)  Labs Reviewed: K+: 4.2 (12/05/2009) Creat: : 0.6 (12/05/2009)   Chol: 191 (07/05/2009)   HDL: 51.30 (07/05/2009)   LDL: 118 (07/05/2009)   TG: 107.0 (07/05/2009)  Problem # 3:  DIASTOLIC DYSFUNCTION (ICD-429.9)  Problem # 4:  MORBID OBESITY (ICD-278.01) Con't with flat belly diet Con't with exercise---- 3 days a week for 15 minutes---she will try to increase time as she gets used to it Ht: 64 (01/09/2010)   Wt: 285.6 (05/08/2010)   BMI: 49.82 (04/08/2010)  Problem # 5:  HYPOTHYROIDISM (ICD-244.9)  Her updated medication list for this problem includes:    Levothyroxine Sodium 88 Mcg Tabs (Levothyroxine sodium) .Marland Kitchen... Take one tablet daily  Labs Reviewed: TSH: 1.53 (12/05/2009)    Chol: 191 (07/05/2009)   HDL: 51.30 (07/05/2009)   LDL: 118 (07/05/2009)   TG: 107.0 (07/05/2009)  Complete Medication List: 1)  Diovan Hct 80-12.5 Mg Tabs (Valsartan-hydrochlorothiazide) .Marland Kitchen.. 1 by mouth once daily 2)  Levothyroxine Sodium 88 Mcg Tabs (Levothyroxine sodium) .... Take one tablet daily 3)  Ferrous Sulfate 325 (65 Fe) Mg Tbec (Ferrous sulfate) .... Take by mouth as needed 4)  Claritin-d 12 Hour 5-120 Mg Tb12 (Loratadine-pseudoephedrine) .... Take by mouth as needed 5)  Nexium 40 Mg  Pack (Esomeprazole magnesium) .Marland Kitchen.. 1 by mouth once daily as needed 6)  Nasonex 50 Mcg/act Susp (Mometasone furoate) .... 2 sprays each nostril once daily 7)  Cpap 10  .... At bedtime 8)  Klor-con M20 20 Meq Cr-tabs (Potassium chloride crys cr) .... Take one tablet  by mouth once daily. 9)  Zyrtec Allergy 10 Mg Tabs (Cetirizine hcl) .Marland Kitchen.. 1 by mouth once daily 10)  Toprol Xl 100 Mg Xr24h-tab (Metoprolol succinate) .Marland Kitchen.. 1 by mouth once daily 11)  Cyclobenzaprine Hcl 10 Mg Tabs (Cyclobenzaprine hcl) .... Take 1 tab at bedtime as needed 12)  Ultram 50 Mg Tabs (Tramadol hcl) .Marland Kitchen.. 1 by mouth q6h as needed  Patient Instructions: 1)  f/u after surgery Prescriptions: LEVOTHYROXINE SODIUM 88 MCG  TABS (LEVOTHYROXINE SODIUM) Take one tablet daily  #90 x 1   Entered by:   Almeta Monas CMA (AAMA)   Authorized by:   Loreen Freud DO   Signed by:   Almeta Monas CMA (AAMA) on 05/08/2010   Method used:   Electronically  to        [facility]  [address] [id number] (retail)       [address]       [place], [place]  [address]       Ph: [phone] or [phone]       Fax: [phone]   RxID:   [id number] DIOVAN HCT 80-12.5 MG TABS (VALSARTAN-HYDROCHLOROTHIAZIDE) 1 by mouth once daily  #90 Tablet x 1   Entered by:   Almeta Monas CMA (AAMA)   Authorized by:   Loreen Freud DO   Signed by:   Almeta Monas CMA (AAMA) on 05/08/2010   Method used:   Electronically to        [facility]  [address] [id number] ([facility]       [address]       [place], [place]  [address]       Ph: [phone] or [phone]       Fax: [phone]   RxID:   [id number]    Orders Added: 1)  Est. Patient Level III [id number]

## 2010-10-08 NOTE — Procedures (Signed)
NAMEDELVA, DERDEN NO.:  [id number]   MEDICAL RECORD NO.:  [id number]          PATIENT TYPE:  OUT   LOCATION:  SLEEP CENTER                 FACILITY:  [facility]   PHYSICIAN:  Barbaraann Share, MD,FCCPDATE OF BIRTH:  14-Aug-1964   DATE OF STUDY:  12/13/2007                            NOCTURNAL POLYSOMNOGRAM   REFERRING PHYSICIAN:  Barbaraann Share, MD,FCCP   REFERRING PHYSICIAN:  Barbaraann Share, M.D.   INDICATION FOR STUDY:  Hypersomnia with sleep apnea.   EPWORTH SLEEPINESS SCORE:  15.   SLEEP ARCHITECTURE:  The patient had a total sleep time of 281 minutes  with no slow-wave sleep and very little REM noted. Sleep onset latency  was normal at 24 minutes and REM onset was very prolonged at 261  minutes. Sleep efficiency was decreased at 78%.   RESPIRATORY DATA:  The patient was found to have 42 obstructive apneas  and 140 hypopneas for an apnea/hypopnea index of 39 events per hour. In  addition, she was found to have 102 respiratory effort related arousals,  given a respiratory disturbance index of 61 events per hour. The events  were not positional, and there was loud snoring noted throughout.   OXYGEN DATA:  There was O2 desaturation as low as 86% with the patient's  obstructive events.   CARDIAC DATA:  No clinically significant arrhythmias were noted.   MOVEMENT-PARASOMNIA:  The patient was found to have no significant leg  jerks or abnormal behaviors.   IMPRESSIONS-RECOMMENDATIONS:  Severe obstructive sleep apnea with an AHI  of 39 events per hour and a RDI of 61 events per hour. There was O2  desaturation as low as 86%. Treatment for this degree of sleep apnea  should focus primarily on weight loss as well as CPAP.      Barbaraann Share, MD,FCCP  Diplomate, American Board of Sleep  Medicine  Electronically Signed    KMC/MEDQ  D:  12/30/2007 08:55:14  T:  12/30/2007 78:46:96  Job:  [id number]

## 2010-10-08 NOTE — Assessment & Plan Note (Signed)
[facility]                            CARDIOLOGY OFFICE NOTE   NAME:Cedeno, LIBERTY SETO                       MRN:          [id number]  DATE:01/27/2007                            DOB:          1965-09-23    Mrs. Kjos is a very pleasant 45 year old female, whom I am asked to  evaluate for chest pain.   The patient has no prior cardiac history.  She typically does not have  significant dyspnea on exertion, orthopnea, PND, pedal edema,  palpitations, presyncope, syncope or exertional chest pain.  For  approximately one year, she has had a pain in the left chest area.  It  is described as a toothache sensation.  It lasts anywhere from 15  seconds to one minute.  The pain does not radiate.  It is not pleuritic  or positional, nor is it exertional.  It is not related to food.  It is  not associated with nausea or vomiting or diaphoresis, but there can be  shortness of breath.  Because of her symptoms, we were asked to further  evaluate.   MEDICATIONS INCLUDE:  1. Iron 324 mg p.o. t.i.d.  2. HCTZ 25 mg p.o. daily.  3. Levothyroxine 88 micrograms p.o. daily.   ALLERGIES:  She has an ALLERGY TO PENICILLIN.   SOCIAL HISTORY:  She does not smoke, nor does she consume alcohol.  There is no drug use.   FAMILY HISTORY:  Negative for coronary artery disease.   PAST MEDICAL HISTORY:  Significant for hypertension, but there is no  diabetes mellitus or hyperlipidemia.  She does have a history of  hypothyroidism.  She has a history of thrombocytosis.  She has had a  prior lipoma removed from her back.  She also has some arthritis in her  back.  There is no other past medical history noted.   REVIEW OF SYSTEMS:  She denies any headaches, fevers or chills.  There  is no productive cough or hemoptysis.  There is no dysphagia,  odynophagia, melena or hematochezia.  There is no dysuria or hematuria.  She does have heavy menstrual cycles.  There is no orthopnea, PND  or  pedal edema.  There are no rashes or seizure activity.  There is no  claudication.  She does have some pain in her back with ambulation.  The  remaining systems were negative.   PHYSICAL EXAM:  Her physical exam today shows a blood pressure of  160/100 and her pulse is 77.  She weighs 279 pounds.  She is well-developed and obese.  She is in no acute distress.  SKIN:  Warm and dry.  She does not appear to be depressed.  There is no peripheral clubbing.  BACK:  Normal.  HEENT:  Normal with normal eyelids.  NECK:  Supple with a normal upstroke bilaterally and I cannot appreciate  bruits.  There is no jugular venous distention and no thyromegaly is  noted.  CHEST:  Clear to auscultation with normal expansion.  CARDIOVASCULAR EXAM:  Reveals regular rate and rhythm, normal S1 and S2.  There are no murmurs,  rubs or gallops noted.  Her PMI is not palpated.  ABDOMINAL EXAM:  Difficult due to her obesity.  There are striations  noted.  There is no hepatosplenomegaly and no tenderness to palpation.  I cannot palpate masses.  There is no bruit noted.  She has 2+ femoral  pulses bilaterally, no bruits.  EXTREMITIES:  Showed no edema.  I could palpate no cords.  She has 2+  dorsalis pedis pulses bilaterally.  NEUROLOGIC EXAM:  Grossly intact.   Her electrocardiogram shows a sinus rhythm at a rate of 77.  There are  no ST changes noted.   DIAGNOSES:  1. Atypical chest pain:  Her symptoms are very atypical.  She does      have risk factor of hypertension.  We will risk stratify with an      adenosine Myoview.  If it shows normal perfusion, we will not      pursue further cardiac workup.  2. Hypertension:  Her blood pressure is elevated today.  However, she      states that it varies at times.  This will need to be tracked and      additional medications can be added as needed.  I will leave this      to Dr. Blossom Hoops.  3. Thrombocytosis:  Per Dr. Blossom Hoops.  4. Hypothyroidism:  She will  continue on her present dose of      levothyroxine and I will ask her to follow up with Dr. Blossom Hoops      concerning this issue.   We will see her back on an as-needed basis, pending the results of her  Myoview.   Note:  We discussed the importance of risk factor modification,  including diet, exercise and weight-loss.     Madolyn Frieze Jens Som, MD, [facility]  Electronically Signed    BSC/MedQ  DD: 01/27/2007  DT: 01/27/2007  Job #: [id number]   cc:   Leanne Chang, M.D.

## 2010-10-08 NOTE — Op Note (Signed)
[name], [name]                ACCOUNT NO.:  [id number]   MEDICAL RECORD NO.:  [id number]          PATIENT TYPE:  AMB   LOCATION:  DSC                          FACILITY:  [facility]   PHYSICIAN:  Thornton Park. Daphine Deutscher, MD  DATE OF BIRTH:  24-Dec-1965   DATE OF PROCEDURE:  11/25/2006  DATE OF DISCHARGE:                               OPERATIVE REPORT   CCS NUMBER:  [id number]   CHIEF COMPLAINT:  Mass on the right back.   HISTORY:  [name] is a 45 year old black female who was seen in the  office on Oct 07, 2006 for an area of tenderness in the right back area;  this was to the right of midline and she described it as a trigger  point.  I told her that removing this may out diminish her pain.  It is  also somewhat vague.   SURGEON:  Thornton Park. Daphine Deutscher, MD   DESCRIPTION OF PROCEDURE:  The patient was taken back to room #3, given  general and placed in prone position.  With her in that position, the  area that I had previously marked, after prepping it and rubbing it, I  could feel a little squishy area and I think that was well localized in  the area where she had the trigger point.  I made an incision over this,  went in and removed a mass of fatty tissue about the size of a grade A  large egg; this was sent for permanent sections.  In the meantime, I  used the electrocautery to control bleeding.  I then palpated carefully  deep inferiorly and superiorly and did not feel any other masses or  suspicious areas.  The area was infiltrated with 0.25% Marcaine and then  was closed with 4-0 Vicryl subcutaneously and subcuticularly and then  with Dermabond.  The patient tolerated the procedure well.  She was  given a prescription for Vicodin to take if needed for pain will be  followed up in the office in about 3 weeks.      Thornton Park Daphine Deutscher, MD  Electronically Signed     MBM/MEDQ  D:  11/25/2006  T:  11/26/2006  Job:  [id number]   cc:   Leanne Chang, M.D.  Fax: [phone]

## 2010-10-08 NOTE — Assessment & Plan Note (Signed)
HEALTHCARE                         GASTROENTEROLOGY OFFICE NOTE   NAME:[name]                       MRN:          [id number]  DATE:01/22/2007                            DOB:          Jun 16, 1965    CHIEF COMPLAINT:  [name] is referred for iron-deficiency anemia  evaluation.   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  She does have a history of  metromenorrhagia and has had cervical polyps.  Her menstrual periods may  last 5 days and are associated with clots.  She has had gynecologic  evaluation within [name] last year.  As part of a recent checkup she was found to have a low hemoglobin of 11  with a low iron saturation and slight thrombocytosis.  She is referred  for evaluation.  She really denies any GI complaints whatsoever.  Specifically, she denies acid reflux, dysphagia, change in bowel habits,  melena, or hematochezia.  She has been on iron therapy for [name] last  month.  She follows a regular diet, denies any specific food  intolerances.  She does not abuse aspirin, NSAIDs, alcohol, or  cigarettes.  She has never had prior GI evaluations.  It is of note that  her father did have colon polyps in his early 32s.   [name] has:  1. Chronic thyroid dysfunction.  2. Hypertension.   As mentioned previously she has had surgery for cervical polyps.   FAMILY HISTORY:  Otherwise, remarkable for breast cancer in her mother.   SOCIAL HISTORY:  She is married and lives with her husband and 3  children.  She has a 12th grade education and works as a Futures trader.  She  does not smoke or use ethanol.   REVIEW OF SYSTEMS:  Remarkable for chronic low back pain, chronic  fatigue, shortness of breath with exertion.  Last menstrual period was  January 12, 2007.  Review of systems otherwise  noncontributory.   MEDICATIONS:  1. Iron 3 times a day.  2. HCTZ 25 mg a day.  3. L-thyroxine 88 mcg a day.   She denies drug allergies.   PHYSICAL EXAMINATION:  GENERAL:  Shows her to be a very pleasant,  attractive female appearing her stated age.  She is in no acute  distress.  VITAL SIGNS:  She is 5 feet 4 inches tall and weighs 276 pounds, blood  pressure is 118/86, and pulse was 60 and regular.  NECK:  I could not appreciate thyromegaly or stigmata or chronic liver  disease.  CHEST:  Clear.  HEART:  She was in a regular rhythm without murmurs, gallops, or rubs.  ABDOMEN:  I could not appreciate any abdominal masses, tenderness,  distention.  Bowel sounds were normal.  RECTAL:  Showed normal stool which was somewhat dark from [name] presence  of iron but stool was guaiac negative.  EXTREMITIES:  Unremarkable.  MENTAL STATUS:  Clear.   ASSESSMENT:  1. Iron-deficiency anemia probably from metromenorrhagia.  2. Rule out colonic polyposis especially with a history of colon      polyps in her father.  3. Well controlled hypertension.  4. Well controlled thyroid dysfunction.   RECOMMENDATIONS:  1. I have scheduled [name] for outpatient colonoscopy at her      convenience.  2. She is to continue all of her other medications listed above in [name]      interim.     Vania Rea. Jarold Motto, MD, [facility] Essex, FAGA  Electronically Signed    DRP/MedQ  DD: 01/22/2007  DT: 01/22/2007  Job #: [id number]   cc:   Leanne Chang, M.D.

## 2010-10-08 NOTE — Op Note (Signed)
NAMESOPHI, CALLIGAN                ACCOUNT NO.:  [id number]   MEDICAL RECORD NO.:  [id number]          PATIENT TYPE:  AMB   LOCATION:  SDC                           FACILITY:  [facility]   PHYSICIAN:  Naima A. Dillard, M.D. DATE OF BIRTH:  10/09/1965   DATE OF PROCEDURE:  05/18/2007  DATE OF DISCHARGE:                               OPERATIVE REPORT   PREOPERATIVE DIAGNOSIS:  Menorrhagia with uterine fibroids.   POSTOPERATIVE DIAGNOSIS:  Menorrhagia with uterine fibroids.   PROCEDURE:  D&C, hysteroscopy, ThermaChoice ablation.   SURGEON:  Naima A. Dillard, M.D.   ASSISTANT:  None.   ANESTHESIA:  General and local.   FINDINGS:  Abundant endometrium.   SPECIMENS:  Endometrial curettings sent to pathology.   ESTIMATED BLOOD LOSS:  Minimal.   COMPLICATIONS:  None.   FLUID DEFICIT:  There was 100 mL deficit of D5W.   DISPOSITION:  The patient went to the PACU in stable condition.   PROCEDURE IN DETAIL:  The patient was taken to the operating room where  she was given general anesthesia, placed in dorsal lithotomy position,  and prepped and draped in a normal sterile fashion.  In-and-out catheter  was used to drain the bladder.  The patient was examined and noted to  have about an 8-week size uterus with no adnexal masses palpated.   A bivalve speculum was placed into the vagina.  The anterior lip of the  cervix was grasped with a single-tooth tenaculum, and 20 mL of 1%  lidocaine was used for cervical block.  The cervix was dilated with  Pratt dilators up to 21.  The hysteroscope was placed into the uterine  cavity.  Both ostia were visualized.  There was an abundant amount of  endometrium.  There were no polyps and no submucosal fibroids seen.  The  hysteroscope was removed out of the uterine cavity.  A sharp curettage  was done, and endometrial curettings were obtained and sent to  pathology.  ThermaChoice was then done per protocol.  The ThermaChoice  catheter was removed, and  the hysteroscope was replaced into the uterine  cavity.  The uterine cavity was ablated 100%.  There were no pink areas  noted.  The scope was removed.  The tenaculum was removed, with some  bleeding at the tenaculum site which was made hemostatic with silver  nitrate.  Sponge, lap and instrument counts were correct.   The patient went to the recovery room in stable condition.      Naima A. Normand Sloop, M.D.  Electronically Signed     NAD/MEDQ  D:  05/18/2007  T:  05/18/2007  Job:  [id number]

## 2010-10-08 NOTE — H&P (Signed)
NAMEEDDIE, [name]                ACCOUNT NO.:  [id number]   MEDICAL RECORD NO.:  [id number]          PATIENT TYPE:  AMB   LOCATION:  [facility]                           FACILITY:  [facility]   PHYSICIAN:  Naima A. Dillard, M.D. DATE OF BIRTH:  [birth year]   DATE OF ADMISSION:  05/18/2007  DATE OF DISCHARGE:                              HISTORY & PHYSICAL   CHIEF COMPLAINT:  Menorrhagia with uterine fibroids.   HISTORY OF PRESENT ILLNESS:  The patient is a 45 year old African-  American female, gravida 4, para 3, who presented to me stating that she  has had heavy clotting and heavy cycles since February of 2008.  She  reports she has large clots with menses.  Her menses last five days, and  she changes two pads about two to three times a day, but she wears two  pads at a time.  They get fully soaked.  She has occasional cramping and  occasionally blood soaks to her clothes and sheets in the evening.  The  patient denies having any bleeding disorder.  She is not on any  contraception.  She does have fibroids.  She is not on any hormone  therapy or new medication.  She is on Synthroid, and her last TSH was  checked which was within normal limits.  She denies any menopausal  symptoms or vaginal discharge.  She has occasional cramping and no  increased stress.  The patient also had some mild urgency and frequency  that radiates to the front __________ .  She had no blood in her urine  but states that her bladder would be leaking occasionally with coughing  or sneezing, and she definitely had urge incontinence where she could  barely make it to the bathroom.   PAST MEDICAL HISTORY:  Significant for hypertension and hypothyroidism.   MEDICATIONS:  Include hydrochlorothiazide, Synthroid, and ferrous  sulfate.   ALLERGIES:  PENICILLIN.   SOCIAL HISTORY:  Negative for tobacco, alcohol, and drug use.   REVIEW OF SYSTEMS:  ENDOCRINE:  Significant for hypothyroidism.  CARDIOVASCULAR:  Significant for  hypertension.  GENITOURINARY:  Significant for fibroids.  MUSCULOSKELETAL:  Significant for obesity.  PSYCHIATRIC:  No depression.   PHYSICAL EXAMINATION:  VITAL SIGNS:  The patient weighs 283 pounds,  blood pressure is 130/78.   HEENT:  Pupils are equal, hearing is normal.  Throat is clear.  Thyroid  is not enlarged.  HEART:  Regular rate and rhythm.  LUNGS:  Clear to auscultation bilaterally.  BREASTS:  Have no masses, discharge, skin changes, or nipple retraction.  BACK:  Has no CVA tenderness.  ABDOMEN:  Nontender without any masses or organomegaly.  EXTREMITIES:  No cyanosis, clubbing or edema.  NEUROLOGIC:  Within normal limits.  PELVIC:  Vaginal exam is within normal limits.  Cervix was nontender  without lesions.  Uterus is normal in size, shape and consistency and  nontender.  Adnexa:  No masses and nontender.  She is 5/5, pulse is 80.  Her hemoglobin is 11.2.  The patient had urodynamics which was  significant for urge incontinence.  Her ultrasound  was significant for  uterus measuring 8.7 x 4.6 x 6.8 cm with a thickened endometrium and  normal ovaries.  Her urine pregnancy test was negative.   ASSESSMENT:  Menorrhagia and uterine fibroids.  All treatments were  reviewed for the patient for fibroids and menorrhagia which include  observation, hormonal treatment, dilatation and curettage, ablation,  hysteroscopy, and hysterectomy.  The patient chose dilatation and  curettage, hysteroscopy, and ablation.  She declined endometrial biopsy  and understands that if the endometrial curet is significant for cancer,  she may need additional surgery.  The patient understands the risks are  but not limited to bleeding, infection, damage to her internal organs  such as bowel, bladder, and major blood vessels.      Naima A. Normand Sloop, M.D.  Electronically Signed     NAD/MEDQ  D:  05/18/2007  T:  05/18/2007  Job:  [id number]

## 2010-12-16 ENCOUNTER — Ambulatory Visit: Payer: Self-pay | Admitting: Pulmonary Disease

## 2010-12-25 ENCOUNTER — Ambulatory Visit: Payer: Self-pay | Admitting: Pulmonary Disease

## 2011-01-02 ENCOUNTER — Ambulatory Visit: Payer: Self-pay | Admitting: Family Medicine

## 2011-01-10 ENCOUNTER — Ambulatory Visit (INDEPENDENT_AMBULATORY_CARE_PROVIDER_SITE_OTHER): Payer: PRIVATE HEALTH INSURANCE | Admitting: Family Medicine

## 2011-01-10 ENCOUNTER — Encounter: Payer: Self-pay | Admitting: Family Medicine

## 2011-01-10 DIAGNOSIS — I10 Essential (primary) hypertension: Secondary | ICD-10-CM

## 2011-01-10 DIAGNOSIS — E039 Hypothyroidism, unspecified: Secondary | ICD-10-CM

## 2011-01-10 DIAGNOSIS — K449 Diaphragmatic hernia without obstruction or gangrene: Secondary | ICD-10-CM

## 2011-01-10 DIAGNOSIS — R1310 Dysphagia, unspecified: Secondary | ICD-10-CM

## 2011-01-10 LAB — CBC WITH DIFFERENTIAL/PLATELET
Basophils Relative: 0 % (ref 0–1)
HCT: 42 % (ref 36.0–46.0)
Hemoglobin: 13.5 g/dL (ref 12.0–15.0)
Lymphocytes Relative: 44 % (ref 12–46)
Lymphs Abs: 3.6 10*3/uL (ref 0.7–4.0)
Monocytes Absolute: 0.4 10*3/uL (ref 0.1–1.0)
Monocytes Relative: 5 % (ref 3–12)
Neutro Abs: 4.1 10*3/uL (ref 1.7–7.7)
Neutrophils Relative %: 50 % (ref 43–77)
RBC: 4.53 MIL/uL (ref 3.87–5.11)
WBC: 8.2 10*3/uL (ref 4.0–10.5)

## 2011-01-10 LAB — LIPID PANEL
LDL Cholesterol: 101 mg/dL — ABNORMAL HIGH (ref 0–99)
Triglycerides: 107 mg/dL (ref <150)
VLDL: 21 mg/dL (ref 0–40)

## 2011-01-10 LAB — HEPATIC FUNCTION PANEL
ALT: 12 U/L (ref 0–35)
AST: 21 U/L (ref 0–37)
Bilirubin, Direct: 0.1 mg/dL (ref 0.0–0.3)
Indirect Bilirubin: 0.2 mg/dL (ref 0.0–0.9)
Total Protein: 7 g/dL (ref 6.0–8.3)

## 2011-01-10 LAB — POCT URINALYSIS DIPSTICK
Bilirubin, UA: NEGATIVE
Blood, UA: NEGATIVE
Nitrite, UA: NEGATIVE
Spec Grav, UA: 1.005
Urobilinogen, UA: 0.2
pH, UA: 7

## 2011-01-10 LAB — BASIC METABOLIC PANEL
CO2: 28 mEq/L (ref 19–32)
Chloride: 99 mEq/L (ref 96–112)
Creat: 0.72 mg/dL (ref 0.50–1.10)
Potassium: 4.4 mEq/L (ref 3.5–5.3)

## 2011-01-10 NOTE — Progress Notes (Signed)
  Subjective:    Patient here for follow-up of elevated blood pressure.  She is exercising and is adherent to a low-salt diet.  Blood pressure is well controlled at home. Cardiac symptoms: none. Patient denies: chest pain, chest pressure/discomfort, claudication, dyspnea, exertional chest pressure/discomfort, fatigue, irregular heart beat, lower extremity edema, near-syncope, orthopnea, palpitations, paroxysmal nocturnal dyspnea, syncope and tachypnea. Cardiovascular risk factors: hypertension and obesity (BMI >= 30 kg/m2). Use of agents associated with hypertension: none. History of target organ damage: none.  The following portions of the patient's history were reviewed and updated as appropriate: allergies, current medications, past family history, past medical history, past social history, past surgical history and problem list.  Review of Systems As above    Objective:    BP 130/72  Pulse 54  Temp(Src) 98.6 F (37 C) (Oral)  Wt 222 lb (100.699 kg)  SpO2 97% General appearance: alert, cooperative, appears stated age and no distress Neck: no adenopathy, no carotid bruit, no JVD, supple, symmetrical, trachea midline and thyroid not enlarged, symmetric, no tenderness/mass/nodules Lungs: clear to auscultation bilaterally Heart: regular rate and rhythm, S1, S2 normal, no murmur, click, rub or gallop Extremities: extremities normal, atraumatic, no cyanosis or edema    Assessment:    Hypertension, normal blood pressure . Evience of target organ damage: none.   hypothyroid---check labs  hiatal hernia--- change nexium to dexilant Plan:    Medication: no change. Dietary sodium restriction. Regular aerobic exercise. Follow up: 3 months and as needed.

## 2011-01-10 NOTE — Patient Instructions (Signed)
Hiatal Hernia A hiatal hernia occurs when a part of the stomach slides above the diaphragm. The diaphragm is the thin muscle separating the belly (abdomen) from the chest. A hiatal hernia can be something you are born with or develop over time. Hiatal hernias may allow stomach acid to flow back into your esophagus, the tube which carries food from your mouth to your stomach. If this acid causes problems it is called GERD (gastro-esophageal reflux disease).  SYMPTOMS Common symptoms of GERD are heartburn (burning in your chest). This is worse when lying down or bending over. It may also cause belching and indigestion. Some of the things which make GERD worse are:  Increased weight pushes on stomach making acid rise more easily.   Smoking markedly increases acid production.   Alcohol decreases lower esophageal sphincter pressure (valve between stomach and esophagus), allowing acid from stomach into esophagus.   Late evening meals and going to bed with a full stomach increases pressure.   Anything that causes an increase in acid production.   Lower esophageal sphincter incompetence.  DIAGNOSIS Hiatal hernia is often diagnosed with x-rays of your stomach and small bowel. This is called an UGI (upper gastrointestinal x-ray). Sometimes a gastroscopic procedure is done. This is a procedure where your caregiver uses a flexible instrument to look into the stomach and small bowel. HOME CARE INSTRUCTIONS  Try to achieve and maintain an ideal body weight.   Avoid drinking alcoholic beverages.   Stop smoking.   Put the head of your bed on 4 to 6 inch blocks. This will keep your head and esophagus higher than your stomach. If you cannot use blocks, sleep with several pillows under your head and shoulders.   Over-the-counter medications will decrease acid production. Your caregiver can also prescribe medications for this. Take as directed.   1/2 to 1 teaspoon of an antacid taken every hour while  awake, with meals and at bedtime, will neutralize acid.   DO NOT take aspirin, ibuprofen (Advil or Motrin), or other nonsteroidal anti-inflammatory drugs.   Do not wear tight clothing around your chest or stomach.   Eat smaller meals and eat more frequently. This keeps your stomach from getting too full. Eat slowly.   Do not lie down for 2 or 3 hours after eating. Do not eat or drink anything 1 to 2 hours before going to bed.   Avoid caffeine beverages (colas, coffee, cocoa, tea), fatty foods, citrus fruits and all other foods and drinks that contain acid and that seem to increase the problems.   Avoid bending over, especially after eating. Also avoid straining during bowel movements or when urinating or lifting things. Anything that increases the pressure in your belly increases the amount of acid that may be pushed up into your esophagus.  SEEK IMMEDIATE MEDICAL ATTENTION IF:  There is change in location (pain in arms, neck, jaw, teeth or back) of your pain, or the pain is getting worse.   You also experience nausea, vomiting, sweating (diaphoresis), or shortness of breath.   You develop continual vomiting, vomit blood or coffee ground material, have bright red blood in your stools, or have black tarry stools.  Some of these symptoms could signal other problems such as heart disease. MAKE SURE YOU:   Understand these instructions.   Monitor your condition.   Contact your caregiver if you are not doing well or are getting worse.  Document Released: 08/02/2003 Document Re-Released: 08/08/2008 [facility] Patient Information 2011 [place], [place].

## 2011-01-11 LAB — T3, FREE: T3, Free: 2.7 pg/mL (ref 2.3–4.2)

## 2011-01-11 LAB — T4, FREE: Free T4: 1.23 ng/dL (ref 0.80–1.80)

## 2011-01-13 ENCOUNTER — Encounter: Payer: Self-pay | Admitting: Gastroenterology

## 2011-01-17 ENCOUNTER — Other Ambulatory Visit: Payer: Self-pay | Admitting: Family Medicine

## 2011-01-17 MED ORDER — LEVOTHYROXINE SODIUM 88 MCG PO TABS: 88.0000 ug | ORAL_TABLET | Freq: Every day | ORAL | Status: DC

## 2011-01-17 NOTE — Telephone Encounter (Signed)
Rx faxed.    KP 

## 2011-01-21 ENCOUNTER — Other Ambulatory Visit: Payer: PRIVATE HEALTH INSURANCE

## 2011-01-22 ENCOUNTER — Other Ambulatory Visit (INDEPENDENT_AMBULATORY_CARE_PROVIDER_SITE_OTHER): Payer: PRIVATE HEALTH INSURANCE

## 2011-01-22 DIAGNOSIS — D7289 Other specified disorders of white blood cells: Secondary | ICD-10-CM

## 2011-01-22 LAB — CBC WITH DIFFERENTIAL/PLATELET
Basophils Relative: 0.7 % (ref 0.0–3.0)
Eosinophils Absolute: 0.1 10*3/uL (ref 0.0–0.7)
HCT: 40.9 % (ref 36.0–46.0)
Hemoglobin: 13.4 g/dL (ref 12.0–15.0)
Lymphs Abs: 3 10*3/uL (ref 0.7–4.0)
MCHC: 32.9 g/dL (ref 30.0–36.0)
MCV: 93.6 fl (ref 78.0–100.0)
Monocytes Absolute: 0.4 10*3/uL (ref 0.1–1.0)
Neutro Abs: 3.2 10*3/uL (ref 1.4–7.7)
Neutrophils Relative %: 47.7 % (ref 43.0–77.0)
RBC: 4.36 Mil/uL (ref 3.87–5.11)

## 2011-01-22 NOTE — Progress Notes (Signed)
Labs only

## 2011-01-29 ENCOUNTER — Encounter: Payer: Self-pay | Admitting: *Deleted

## 2011-02-04 ENCOUNTER — Ambulatory Visit: Payer: PRIVATE HEALTH INSURANCE | Admitting: Gastroenterology

## 2011-02-13 ENCOUNTER — Encounter: Payer: Self-pay | Admitting: *Deleted

## 2011-02-14 LAB — CBC
HCT: 40.9
Hemoglobin: 14
MCHC: 34.4
MCV: 86.3
RBC: 4.74

## 2011-02-14 LAB — BASIC METABOLIC PANEL
CO2: 31
Chloride: 99
GFR calc Af Amer: >60
Potassium: 3.8
Sodium: 139

## 2011-02-20 ENCOUNTER — Ambulatory Visit (INDEPENDENT_AMBULATORY_CARE_PROVIDER_SITE_OTHER): Payer: PRIVATE HEALTH INSURANCE | Admitting: Gastroenterology

## 2011-02-20 ENCOUNTER — Encounter: Payer: Self-pay | Admitting: Gastroenterology

## 2011-02-20 ENCOUNTER — Telehealth: Payer: Self-pay | Admitting: *Deleted

## 2011-02-20 VITALS — BP 130/74 | HR 60 | Ht 64.0 in | Wt 220.0 lb

## 2011-02-20 DIAGNOSIS — R1310 Dysphagia, unspecified: Secondary | ICD-10-CM

## 2011-02-20 DIAGNOSIS — R1314 Dysphagia, pharyngoesophageal phase: Secondary | ICD-10-CM

## 2011-02-20 DIAGNOSIS — R1319 Other dysphagia: Secondary | ICD-10-CM

## 2011-02-20 DIAGNOSIS — Z9884 Bariatric surgery status: Secondary | ICD-10-CM

## 2011-02-20 DIAGNOSIS — K219 Gastro-esophageal reflux disease without esophagitis: Secondary | ICD-10-CM

## 2011-02-20 NOTE — Patient Instructions (Signed)
Your procedure has been scheduled for 02/24/2011, please follow the seperate instructions.

## 2011-02-20 NOTE — Progress Notes (Signed)
This is a very pleasant 45 year old [place] American female who had gastric bypass with Roux-en-Y biliary diversion some 7 months ago at [facility]. She had no complications, and has lost 70-80 pounds in weight. She specifically denies any symptoms of dumping syndrome or any hepatobiliary complaints or epigastric pain. She has had several years of acid reflux with PPI therapy, and now has progressive solid food dysphagia intermittent substernal area. She denies lower gastrointestinal or hepatobiliary complaints. She has chronic low back pain, and her weight loss and surgery were directed at this problem. Had a negative colonoscopy several years ago because of a positive family history of colon cancer.  Current Medications, Allergies, Past Medical History, Past Surgical History, Family History and Social History were reviewed in [employer] record.  Pertinent Review of Systems Negative... no symptoms of  Raynaud's phenomena more collagen vascular disease. She denies any specific food intolerances. Also denies any cardiovascular or pulmonary complaints. Rest of 10 for review of systems negative.   Physical Exam: Awake and alert no acute distress. I cannot appreciate stigmata of chronic liver disease. Chest is clear to percussion and auscultation. She is in a regular rhythm without murmurs gallops or rubs. Abdominal exam shows no organomegaly, masses or tenderness. Bowel sounds are normal. Peripheral extremities are unremarkable. Mental status is normal.    Assessment and Plan: Status post gastric bypass with Roux-en-Y biliary diversion. Her dysphagia service suggest an esophageal stricture from chronic GERD. I have scheduled her for endoscopy and possible dilation her convenience. We will continue her PPI therapy with standard antireflux maneuvers. Patient is on multivitamins with mineral supplementation standard for bariatric surgery patients and is followed at [facility]. Encounter Diagnoses  Name Primary?  . GERD (gastroesophageal reflux disease) Yes  . Esophageal dysphagia   . Status post bariatric surgery

## 2011-02-20 NOTE — Telephone Encounter (Signed)
Pt was just seen today ok to no charge for late reschedule.

## 2011-02-20 NOTE — Telephone Encounter (Signed)
Message copied by Leonette Monarch on Thu Feb 20, 2011  2:37 PM ------      Message from: Karna Christmas D      Created: Thu Feb 20, 2011 12:32 PM       Pt was sch'd for Endo on 02-20-11 and resch'd to 03-10-11 because of conflict w/sch'd

## 2011-02-24 ENCOUNTER — Encounter: Payer: PRIVATE HEALTH INSURANCE | Admitting: Gastroenterology

## 2011-02-28 LAB — BASIC METABOLIC PANEL
BUN: 9
Creatinine, Ser: 0.59
GFR calc non Af Amer: >60
Glucose, Bld: 97

## 2011-02-28 LAB — CBC
MCHC: 34.2
MCV: 85.8
Platelets: 492 — ABNORMAL HIGH

## 2011-02-28 LAB — HCG, QUANTITATIVE, PREGNANCY: hCG, Beta Chain, Quant, S: <2

## 2011-03-10 ENCOUNTER — Ambulatory Visit (AMBULATORY_SURGERY_CENTER): Payer: PRIVATE HEALTH INSURANCE | Admitting: Gastroenterology

## 2011-03-10 ENCOUNTER — Encounter: Payer: Self-pay | Admitting: Gastroenterology

## 2011-03-10 DIAGNOSIS — Q391 Atresia of esophagus with tracheo-esophageal fistula: Secondary | ICD-10-CM

## 2011-03-10 DIAGNOSIS — R1314 Dysphagia, pharyngoesophageal phase: Secondary | ICD-10-CM

## 2011-03-10 DIAGNOSIS — K219 Gastro-esophageal reflux disease without esophagitis: Secondary | ICD-10-CM

## 2011-03-10 DIAGNOSIS — Q393 Congenital stenosis and stricture of esophagus: Secondary | ICD-10-CM

## 2011-03-10 DIAGNOSIS — Z9884 Bariatric surgery status: Secondary | ICD-10-CM | POA: Insufficient documentation

## 2011-03-10 DIAGNOSIS — K222 Esophageal obstruction: Secondary | ICD-10-CM

## 2011-03-10 MED ORDER — SODIUM CHLORIDE 0.9 % IV SOLN: 500.0000 mL | INTRAVENOUS | Status: DC

## 2011-03-10 NOTE — Patient Instructions (Signed)
Please review discharge instructions (blue and green sheets)  Follow dilation diet today  Resume your normal medications

## 2011-03-11 ENCOUNTER — Telehealth: Payer: Self-pay | Admitting: *Deleted

## 2011-03-11 LAB — POCT HEMOGLOBIN-HEMACUE: Hemoglobin: 9.9 — ABNORMAL LOW

## 2011-03-11 NOTE — Telephone Encounter (Signed)

## 2011-03-12 LAB — BASIC METABOLIC PANEL
Calcium: 9
Chloride: 101
Creatinine, Ser: 0.7
GFR calc Af Amer: >60
Sodium: 135

## 2011-04-26 ENCOUNTER — Other Ambulatory Visit: Payer: Self-pay | Admitting: Family Medicine

## 2011-07-14 ENCOUNTER — Encounter: Payer: Self-pay | Admitting: Family Medicine

## 2011-07-14 ENCOUNTER — Ambulatory Visit (INDEPENDENT_AMBULATORY_CARE_PROVIDER_SITE_OTHER): Payer: PRIVATE HEALTH INSURANCE | Admitting: Family Medicine

## 2011-07-14 VITALS — BP 114/66 | HR 56 | Temp 98.6°F | Wt 218.6 lb

## 2011-07-14 DIAGNOSIS — R609 Edema, unspecified: Secondary | ICD-10-CM

## 2011-07-14 DIAGNOSIS — D691 Qualitative platelet defects: Secondary | ICD-10-CM

## 2011-07-14 DIAGNOSIS — I10 Essential (primary) hypertension: Secondary | ICD-10-CM

## 2011-07-14 LAB — CBC WITH DIFFERENTIAL/PLATELET
Basophils Relative: 0.6 % (ref 0.0–3.0)
Eosinophils Relative: 2.1 % (ref 0.0–5.0)
Hemoglobin: 13.7 g/dL (ref 12.0–15.0)
Lymphocytes Relative: 38.4 % (ref 12.0–46.0)
MCV: 92 fl (ref 78.0–100.0)
Neutrophils Relative %: 53.3 % (ref 43.0–77.0)
RBC: 4.45 Mil/uL (ref 3.87–5.11)
WBC: 7.2 10*3/uL (ref 4.5–10.5)

## 2011-07-14 MED ORDER — VALSARTAN 40 MG PO TABS: 40.0000 mg | ORAL_TABLET | Freq: Every day | ORAL | Status: DC

## 2011-07-14 MED ORDER — HYDROCHLOROTHIAZIDE 25 MG PO TABS: 25.0000 mg | ORAL_TABLET | Freq: Every day | ORAL | Status: DC

## 2011-07-14 NOTE — Patient Instructions (Signed)

## 2011-07-14 NOTE — Progress Notes (Signed)
  Subjective:    Patient here for follow-up of elevated blood pressure.  She is exercising and is adherent to a low-salt diet.  Blood pressure is well controlled at home. Cardiac symptoms: none. Patient denies: chest pain, chest pressure/discomfort, claudication, dyspnea, exertional chest pressure/discomfort, fatigue, irregular heart beat, lower extremity edema, near-syncope, orthopnea, palpitations, paroxysmal nocturnal dyspnea, syncope and tachypnea. Cardiovascular risk factors: hypertension and obesity (BMI >= 30 kg/m2). Use of agents associated with hypertension: none. History of target organ damage: none.  The following portions of the patient's history were reviewed and updated as appropriate: allergies, current medications, past family history, past medical history, past social history, past surgical history and problem list.  Review of Systems Pertinent items are noted in HPI.     Objective:    BP 114/66  Pulse 56  Temp(Src) 98.6 F (37 C) (Oral)  Wt 218 lb 9.6 oz (99.156 kg)  SpO2 97% General appearance: alert, cooperative, appears stated age and no distress Lungs: clear to auscultation bilaterally Heart: S1, S2 normal---+ murmur  2/6 Extremities: extremities normal, atraumatic, no cyanosis or edema    Assessment:    Hypertension, normal blood pressure . Evidence of target organ damage: none.   obesity---pt doing great with weight loss Plan:    Medication: decrease to diovan 40 mg  and hctz 25mg  . Dietary sodium restriction. Regular aerobic exercise. Check blood pressures 2-3 times weekly and record. Follow up: 6 months and as needed.

## 2011-07-18 ENCOUNTER — Telehealth: Payer: Self-pay | Admitting: *Deleted

## 2011-07-18 NOTE — Telephone Encounter (Signed)
Pt let VM that Diovan is to expensive at $50 a month. Pt is requesting a cheaper med..Please advise

## 2011-07-19 NOTE — Telephone Encounter (Signed)
Do we have samples of 80 we can give her and she can cut with pill cutter.  She may be able to come off med but I want to see how her bp is with 40 mg .

## 2011-07-21 NOTE — Telephone Encounter (Signed)
Left message to call office

## 2011-07-22 NOTE — Telephone Encounter (Signed)
Discuss with patient, samples placed up front for pick up.  

## 2011-07-22 NOTE — Telephone Encounter (Signed)
Left message to call office

## 2011-09-04 ENCOUNTER — Other Ambulatory Visit: Payer: Self-pay | Admitting: Family Medicine

## 2011-09-18 ENCOUNTER — Telehealth: Payer: Self-pay | Admitting: *Deleted

## 2011-09-18 NOTE — Telephone Encounter (Signed)
Pt left VM stating that Diovan is too expensive and would like to get a cheaper med. .Left message to call office  Per Dr Laury Axon previous message would provide Pt with samples of 80mg  and she can cut with pill cutter. She may be able to come off med but she wanted to see how her bp is with 40 mg.

## 2011-09-19 NOTE — Telephone Encounter (Signed)
Discuss with patient per Dr Beverely Low, Pt to continue with Diovan until she gives out, but continue to monitor BP and call with any high reading. Pt has pending OV for 10-03-11

## 2011-09-19 NOTE — Telephone Encounter (Signed)
Agree w/ stopping med and reassessing in 2 weeks at appt

## 2011-09-23 ENCOUNTER — Telehealth: Payer: Self-pay

## 2011-09-23 DIAGNOSIS — I10 Essential (primary) hypertension: Secondary | ICD-10-CM

## 2011-09-23 NOTE — Telephone Encounter (Signed)
Discussed with patient and she will stop by this morning to check her BP.

## 2011-09-23 NOTE — Telephone Encounter (Signed)
Message copied by Arnette Norris on Tue Sep 23, 2011  8:12 AM ------      Message from: Lelon Perla      Created: Mon Sep 22, 2011  9:03 PM      Contact: Shontel       Can pt come in for bp check only?--- free of charge      ----- Message -----         From: Cecille Po         Sent: 09/22/2011   4:09 PM           To: Lelon Perla, DO            Patient [name] is calling regarding a refill on her blood pressure medicine. [name] stated that she spoke with Dr. Laury Axon or one of her nurses and was advised to go without her blood pressure medicine until her next appointment, but to call if she started getting headaches. [name] stated that she woke up today with a headache and wanted the refill. [facility] uses the [facility] on [address]. Please call her back regarding the medication refill. She can be reached at [phone] or mobile, [phone]. Thank-you!

## 2011-09-23 NOTE — Telephone Encounter (Signed)
Patient cam into the Office and her BP was 138/74 she advised me before she left home this morning it was 135/78 and it has been i this range for the last few days or so. She stated she took so alka seltzer the night before she woke up with the headache and thinks this may have elevated her BP.      KP

## 2011-09-24 MED ORDER — VALSARTAN 40 MG PO TABS: 40.0000 mg | ORAL_TABLET | Freq: Every day | ORAL | Status: DC

## 2011-09-24 NOTE — Telephone Encounter (Signed)
Refill 40 mg---ov 2 weeks

## 2011-09-24 NOTE — Telephone Encounter (Signed)
If its plain alka seltzer --prob not--- claritin D can though.  That is on her med list.  We can try to increase diovan to 80 mg and have her come in in 2 weeks or sooner if a problem.

## 2011-09-24 NOTE — Telephone Encounter (Signed)
Discussed with patient and she voiced understanding. Patient is not taking anything currently for her BP Would you still like to do 80 mg of Diovan or just refill the 40 mg ?

## 2011-09-25 ENCOUNTER — Telehealth: Payer: Self-pay | Admitting: Family Medicine

## 2011-09-25 NOTE — Telephone Encounter (Signed)
Questions regarding BP Meds, please call at [phone]

## 2011-09-25 NOTE — Telephone Encounter (Signed)
Discussed with patient and she stated she answered her own questions but Rx was 50 dollars. I advised I would give her a discount when she comes in for her apt and she agreed.      KP

## 2011-10-03 ENCOUNTER — Encounter: Payer: Self-pay | Admitting: Family Medicine

## 2011-10-03 ENCOUNTER — Ambulatory Visit (INDEPENDENT_AMBULATORY_CARE_PROVIDER_SITE_OTHER): Payer: PRIVATE HEALTH INSURANCE | Admitting: Family Medicine

## 2011-10-03 VITALS — BP 122/78 | HR 53 | Temp 98.4°F | Wt 216.0 lb

## 2011-10-03 DIAGNOSIS — I10 Essential (primary) hypertension: Secondary | ICD-10-CM

## 2011-10-03 DIAGNOSIS — N390 Urinary tract infection, site not specified: Secondary | ICD-10-CM

## 2011-10-03 DIAGNOSIS — E039 Hypothyroidism, unspecified: Secondary | ICD-10-CM

## 2011-10-03 LAB — POCT URINALYSIS DIPSTICK
Glucose, UA: NEGATIVE
Ketones, UA: NEGATIVE
Spec Grav, UA: 1.01
Urobilinogen, UA: 0.2

## 2011-10-03 LAB — HEPATIC FUNCTION PANEL
ALT: 15 U/L (ref 0–35)
Bilirubin, Direct: 0 mg/dL (ref 0.0–0.3)
Total Bilirubin: 0.5 mg/dL (ref 0.3–1.2)

## 2011-10-03 LAB — CBC WITH DIFFERENTIAL/PLATELET
Basophils Absolute: 0 10*3/uL (ref 0.0–0.1)
HCT: 41.5 % (ref 36.0–46.0)
Lymphs Abs: 2.9 10*3/uL (ref 0.7–4.0)
MCHC: 32.8 g/dL (ref 30.0–36.0)
MCV: 92.4 fl (ref 78.0–100.0)
Monocytes Absolute: 0.5 10*3/uL (ref 0.1–1.0)
Platelets: 445 10*3/uL — ABNORMAL HIGH (ref 150.0–400.0)
RDW: 13.3 % (ref 11.5–14.6)

## 2011-10-03 LAB — BASIC METABOLIC PANEL
BUN: 10 mg/dL (ref 6–23)
Chloride: 102 mEq/L (ref 96–112)
Glucose, Bld: 88 mg/dL (ref 70–99)
Potassium: 4.1 mEq/L (ref 3.5–5.1)

## 2011-10-03 LAB — LIPID PANEL
Cholesterol: 183 mg/dL (ref 0–200)
LDL Cholesterol: 103 mg/dL — ABNORMAL HIGH (ref 0–99)

## 2011-10-03 NOTE — Progress Notes (Signed)
  Subjective:    Patient here for follow-up of elevated blood pressure.  She is not exercising and is adherent to a low-salt diet.  Blood pressure is well controlled at home. Cardiac symptoms: none. Patient denies: chest pain, chest pressure/discomfort, claudication, dyspnea, exertional chest pressure/discomfort, fatigue, irregular heart beat, lower extremity edema, near-syncope, orthopnea, palpitations, paroxysmal nocturnal dyspnea, syncope and tachypnea. Cardiovascular risk factors: hypertension and sedentary lifestyle. Use of agents associated with hypertension: none. History of target organ damage: none.  The following portions of the patient's history were reviewed and updated as appropriate: allergies, current medications, past family history, past medical history, past social history, past surgical history and problem list.  Review of Systems Pertinent items are noted in HPI.     Objective:    BP 122/78  Pulse 53  Temp(Src) 98.4 F (36.9 C) (Oral)  Wt 216 lb (97.977 kg)  SpO2 98% General appearance: alert, cooperative, appears stated age and no distress Neck: no adenopathy, supple, symmetrical, trachea midline and thyroid not enlarged, symmetric, no tenderness/mass/nodules Lungs: clear to auscultation bilaterally Heart: regular rate and rhythm, S1, S2 normal, no murmur, click, rub or gallop Extremities: extremities normal, atraumatic, no cyanosis or edema    Assessment:    Hypertension, normal blood pressure . Evidence of target organ damage: none.    Plan:    Medication: no change. Dietary sodium restriction. Regular aerobic exercise. Check blood pressures 2-3 times weekly and record. Follow up: 3 months and as needed.

## 2011-10-03 NOTE — Assessment & Plan Note (Signed)
Stable con't meds 

## 2011-10-03 NOTE — Patient Instructions (Signed)

## 2011-10-06 LAB — URINE CULTURE

## 2011-10-07 ENCOUNTER — Telehealth: Payer: Self-pay

## 2011-10-07 MED ORDER — CIPROFLOXACIN HCL 500 MG PO TABS: 500.0000 mg | ORAL_TABLET | Freq: Two times a day (BID) | ORAL | Status: DC

## 2011-10-07 NOTE — Telephone Encounter (Signed)
Message copied by Maurice Small on Tue Oct 07, 2011 10:10 AM ------      Message from: Lelon Perla      Created: Mon Oct 06, 2011 12:10 PM       cipro 500 mg 1 po bid for 5 days

## 2011-10-07 NOTE — Telephone Encounter (Signed)
Left message on voicemail for patient to return call when available. Reason for call (not left on voicemail) patient with UTI per culture. RX sent to pharmacy on file.

## 2011-10-07 NOTE — Telephone Encounter (Signed)
Discuss with patient, aware Rx sent.

## 2011-12-22 ENCOUNTER — Telehealth: Payer: Self-pay | Admitting: Family Medicine

## 2011-12-22 DIAGNOSIS — I10 Essential (primary) hypertension: Secondary | ICD-10-CM

## 2011-12-22 MED ORDER — VALSARTAN 40 MG PO TABS: 40.0000 mg | ORAL_TABLET | Freq: Every day | ORAL | Status: DC

## 2011-12-22 NOTE — Telephone Encounter (Signed)
Refill: Diovan 40mg  tablet. Take 1 tablet by mouth every day. Qty 30. Last fill 11-21-11

## 2012-01-12 ENCOUNTER — Encounter: Payer: Self-pay | Admitting: Family Medicine

## 2012-01-12 ENCOUNTER — Ambulatory Visit (INDEPENDENT_AMBULATORY_CARE_PROVIDER_SITE_OTHER): Payer: PRIVATE HEALTH INSURANCE | Admitting: Family Medicine

## 2012-01-12 ENCOUNTER — Telehealth: Payer: Self-pay | Admitting: Family Medicine

## 2012-01-12 ENCOUNTER — Ambulatory Visit: Payer: PRIVATE HEALTH INSURANCE | Admitting: Family Medicine

## 2012-01-12 ENCOUNTER — Other Ambulatory Visit (HOSPITAL_COMMUNITY)
Admission: RE | Admit: 2012-01-12 | Discharge: 2012-01-12 | Disposition: A | Discharge status: Home / Self Care | Payer: Medicare (Managed Care) | Source: Ambulatory Visit | Attending: Family Medicine | Admitting: Family Medicine

## 2012-01-12 VITALS — BP 116/80 | HR 56 | Temp 97.6°F | Ht 63.75 in | Wt 220.8 lb

## 2012-01-12 DIAGNOSIS — J309 Allergic rhinitis, unspecified: Secondary | ICD-10-CM

## 2012-01-12 DIAGNOSIS — Z0000 Encounter for general adult medical examination without abnormal findings: Secondary | ICD-10-CM

## 2012-01-12 DIAGNOSIS — D759 Disease of blood and blood-forming organs, unspecified: Secondary | ICD-10-CM

## 2012-01-12 DIAGNOSIS — I10 Essential (primary) hypertension: Secondary | ICD-10-CM

## 2012-01-12 DIAGNOSIS — Z01419 Encounter for gynecological examination (general) (routine) without abnormal findings: Secondary | ICD-10-CM | POA: Insufficient documentation

## 2012-01-12 DIAGNOSIS — K219 Gastro-esophageal reflux disease without esophagitis: Secondary | ICD-10-CM

## 2012-01-12 DIAGNOSIS — N912 Amenorrhea, unspecified: Secondary | ICD-10-CM

## 2012-01-12 DIAGNOSIS — Z1239 Encounter for other screening for malignant neoplasm of breast: Secondary | ICD-10-CM

## 2012-01-12 DIAGNOSIS — E785 Hyperlipidemia, unspecified: Secondary | ICD-10-CM

## 2012-01-12 DIAGNOSIS — E039 Hypothyroidism, unspecified: Secondary | ICD-10-CM

## 2012-01-12 DIAGNOSIS — Z124 Encounter for screening for malignant neoplasm of cervix: Secondary | ICD-10-CM

## 2012-01-12 LAB — CBC WITH DIFFERENTIAL/PLATELET
Basophils Relative: 0.5 % (ref 0.0–3.0)
Eosinophils Relative: 2.4 % (ref 0.0–5.0)
HCT: 39 % (ref 36.0–46.0)
Hemoglobin: 13 g/dL (ref 12.0–15.0)
Lymphs Abs: 2.5 10*3/uL (ref 0.7–4.0)
MCHC: 33.4 g/dL (ref 30.0–36.0)
MCV: 91.2 fl (ref 78.0–100.0)
Monocytes Absolute: 0.4 10*3/uL (ref 0.1–1.0)
Neutro Abs: 3.1 10*3/uL (ref 1.4–7.7)
RBC: 4.27 Mil/uL (ref 3.87–5.11)
WBC: 6.2 10*3/uL (ref 4.5–10.5)

## 2012-01-12 LAB — BASIC METABOLIC PANEL
GFR: 143.5 mL/min (ref >60.00)
Glucose, Bld: 77 mg/dL (ref 70–99)
Potassium: 3.6 mEq/L (ref 3.5–5.1)
Sodium: 139 mEq/L (ref 135–145)

## 2012-01-12 LAB — POCT URINALYSIS DIPSTICK
Leukocytes, UA: NEGATIVE
Protein, UA: NEGATIVE
Spec Grav, UA: 1.015
Urobilinogen, UA: 0.2
pH, UA: 6.5

## 2012-01-12 LAB — LIPID PANEL
HDL: 66.1 mg/dL (ref >39.00)
LDL Cholesterol: 99 mg/dL (ref 0–99)
VLDL: 9.4 mg/dL (ref 0.0–40.0)

## 2012-01-12 LAB — HEPATIC FUNCTION PANEL
AST: 23 U/L (ref 0–37)
Alkaline Phosphatase: 107 U/L (ref 39–117)
Total Bilirubin: 0.2 mg/dL — ABNORMAL LOW (ref 0.3–1.2)

## 2012-01-12 LAB — LUTEINIZING HORMONE: LH: 17.21 m[IU]/mL

## 2012-01-12 LAB — TSH: TSH: 1.49 u[IU]/mL (ref 0.35–5.50)

## 2012-01-12 NOTE — Telephone Encounter (Signed)
If she is having a problem then we can see her and get her referred to see him for a problem --in which case they would do it sooner but if it is just for screening it can wait.

## 2012-01-12 NOTE — Assessment & Plan Note (Signed)
Check labs 

## 2012-01-12 NOTE — Patient Instructions (Signed)
Preventive Care for Adults, Female A healthy lifestyle and preventive care can promote health and wellness. Preventive health guidelines for women include the following key practices.  A routine yearly physical is a good way to check with your caregiver about your health and preventive screening. It is a chance to share any concerns and updates on your health, and to receive a thorough exam.   Visit your dentist for a routine exam and preventive care every 6 months. Brush your teeth twice a day and floss once a day. Good oral hygiene prevents tooth decay and gum disease.   The frequency of eye exams is based on your age, health, family medical history, use of contact lenses, and other factors. Follow your caregiver's recommendations for frequency of eye exams.   Eat a healthy diet. Foods like vegetables, fruits, whole grains, low-fat dairy products, and lean protein foods contain the nutrients you need without too many calories. Decrease your intake of foods high in solid fats, added sugars, and salt. Eat the right amount of calories for you.Get information about a proper diet from your caregiver, if necessary.   Regular physical exercise is one of the most important things you can do for your health. Most adults should get at least 150 minutes of moderate-intensity exercise (any activity that increases your heart rate and causes you to sweat) each week. In addition, most adults need muscle-strengthening exercises on 2 or more days a week.   Maintain a healthy weight. The body mass index (BMI) is a screening tool to identify possible weight problems. It provides an estimate of body fat based on height and weight. Your caregiver can help determine your BMI, and can help you achieve or maintain a healthy weight.For adults 20 years and older:   A BMI below 18.5 is considered underweight.   A BMI of 18.5 to 24.9 is normal.   A BMI of 25 to 29.9 is considered overweight.   A BMI of 30 and above is  considered obese.   Maintain normal blood lipids and cholesterol levels by exercising and minimizing your intake of saturated fat. Eat a balanced diet with plenty of fruit and vegetables. Blood tests for lipids and cholesterol should begin at age 20 and be repeated every 5 years. If your lipid or cholesterol levels are high, you are over 50, or you are at high risk for heart disease, you may need your cholesterol levels checked more frequently.Ongoing high lipid and cholesterol levels should be treated with medicines if diet and exercise are not effective.   If you smoke, find out from your caregiver how to quit. If you do not use tobacco, do not start.   If you are pregnant, do not drink alcohol. If you are breastfeeding, be very cautious about drinking alcohol. If you are not pregnant and choose to drink alcohol, do not exceed 1 drink per day. One drink is considered to be 12 ounces (355 mL) of beer, 5 ounces (148 mL) of wine, or 1.5 ounces (44 mL) of liquor.   Avoid use of street drugs. Do not share needles with anyone. Ask for help if you need support or instructions about stopping the use of drugs.   High blood pressure causes heart disease and increases the risk of stroke. Your blood pressure should be checked at least every 1 to 2 years. Ongoing high blood pressure should be treated with medicines if weight loss and exercise are not effective.   If you are 55 to 47   years old, ask your caregiver if you should take aspirin to prevent strokes.   Diabetes screening involves taking a blood sample to check your fasting blood sugar level. This should be done once every 3 years, after age 45, if you are within normal weight and without risk factors for diabetes. Testing should be considered at a younger age or be carried out more frequently if you are overweight and have at least 1 risk factor for diabetes.   Breast cancer screening is essential preventive care for women. You should practice "breast  self-awareness." This means understanding the normal appearance and feel of your breasts and may include breast self-examination. Any changes detected, no matter how small, should be reported to a caregiver. Women in their 20s and 30s should have a clinical breast exam (CBE) by a caregiver as part of a regular health exam every 1 to 3 years. After age 40, women should have a CBE every year. Starting at age 40, women should consider having a mammography (breast X-ray test) every year. Women who have a family history of breast cancer should talk to their caregiver about genetic screening. Women at a high risk of breast cancer should talk to their caregivers about having magnetic resonance imaging (MRI) and a mammography every year.   The Pap test is a screening test for cervical cancer. A Pap test can show cell changes on the cervix that might become cervical cancer if left untreated. A Pap test is a procedure in which cells are obtained and examined from the lower end of the uterus (cervix).   Women should have a Pap test starting at age 21.   Between ages 21 and 29, Pap tests should be repeated every 2 years.   Beginning at age 30, you should have a Pap test every 3 years as long as the past 3 Pap tests have been normal.   Some women have medical problems that increase the chance of getting cervical cancer. Talk to your caregiver about these problems. It is especially important to talk to your caregiver if a new problem develops soon after your last Pap test. In these cases, your caregiver may recommend more frequent screening and Pap tests.   The above recommendations are the same for women who have or have not gotten the vaccine for human papillomavirus (HPV).   If you had a hysterectomy for a problem that was not cancer or a condition that could lead to cancer, then you no longer need Pap tests. Even if you no longer need a Pap test, a regular exam is a good idea to make sure no other problems are  starting.   If you are between ages 65 and 70, and you have had normal Pap tests going back 10 years, you no longer need Pap tests. Even if you no longer need a Pap test, a regular exam is a good idea to make sure no other problems are starting.   If you have had past treatment for cervical cancer or a condition that could lead to cancer, you need Pap tests and screening for cancer for at least 20 years after your treatment.   If Pap tests have been discontinued, risk factors (such as a new sexual partner) need to be reassessed to determine if screening should be resumed.   The HPV test is an additional test that may be used for cervical cancer screening. The HPV test looks for the virus that can cause the cell changes on the cervix.   The cells collected during the Pap test can be tested for HPV. The HPV test could be used to screen women aged 30 years and older, and should be used in women of any age who have unclear Pap test results. After the age of 30, women should have HPV testing at the same frequency as a Pap test.   Colorectal cancer can be detected and often prevented. Most routine colorectal cancer screening begins at the age of 50 and continues through age 75. However, your caregiver may recommend screening at an earlier age if you have risk factors for colon cancer. On a yearly basis, your caregiver may provide home test kits to check for hidden blood in the stool. Use of a small camera at the end of a tube, to directly examine the colon (sigmoidoscopy or colonoscopy), can detect the earliest forms of colorectal cancer. Talk to your caregiver about this at age 50, when routine screening begins. Direct examination of the colon should be repeated every 5 to 10 years through age 75, unless early forms of pre-cancerous polyps or small growths are found.   Hepatitis C blood testing is recommended for all people born from 1945 through 1965 and any individual with known risks for hepatitis C.    Practice safe sex. Use condoms and avoid high-risk sexual practices to reduce the spread of sexually transmitted infections (STIs). STIs include gonorrhea, chlamydia, syphilis, trichomonas, herpes, HPV, and human immunodeficiency virus (HIV). Herpes, HIV, and HPV are viral illnesses that have no cure. They can result in disability, cancer, and death. Sexually active women aged 25 and younger should be checked for chlamydia. Older women with new or multiple partners should also be tested for chlamydia. Testing for other STIs is recommended if you are sexually active and at increased risk.   Osteoporosis is a disease in which the bones lose minerals and strength with aging. This can result in serious bone fractures. The risk of osteoporosis can be identified using a bone density scan. Women ages 65 and over and women at risk for fractures or osteoporosis should discuss screening with their caregivers. Ask your caregiver whether you should take a calcium supplement or vitamin D to reduce the rate of osteoporosis.   Menopause can be associated with physical symptoms and risks. Hormone replacement therapy is available to decrease symptoms and risks. You should talk to your caregiver about whether hormone replacement therapy is right for you.   Use sunscreen with sun protection factor (SPF) of 30 or more. Apply sunscreen liberally and repeatedly throughout the day. You should seek shade when your shadow is shorter than you. Protect yourself by wearing long sleeves, pants, a wide-brimmed hat, and sunglasses year round, whenever you are outdoors.   Once a month, do a whole body skin exam, using a mirror to look at the skin on your back. Notify your caregiver of new moles, moles that have irregular borders, moles that are larger than a pencil eraser, or moles that have changed in shape or color.   Stay current with required immunizations.   Influenza. You need a dose every fall (or winter). The composition of  the flu vaccine changes each year, so being vaccinated once is not enough.   Pneumococcal polysaccharide. You need 1 to 2 doses if you smoke cigarettes or if you have certain chronic medical conditions. You need 1 dose at age 65 (or older) if you have never been vaccinated.   Tetanus, diphtheria, pertussis (Tdap, Td). Get 1 dose of   Tdap vaccine if you are younger than age 65, are over 65 and have contact with an infant, are a healthcare worker, are pregnant, or simply want to be protected from whooping cough. After that, you need a Td booster dose every 10 years. Consult your caregiver if you have not had at least 3 tetanus and diphtheria-containing shots sometime in your life or have a deep or dirty wound.   HPV. You need this vaccine if you are a woman age 26 or younger. The vaccine is given in 3 doses over 6 months.   Measles, mumps, rubella (MMR). You need at least 1 dose of MMR if you were born in 1957 or later. You may also need a second dose.   Meningococcal. If you are age 19 to 21 and a first-year college student living in a residence hall, or have one of several medical conditions, you need to get vaccinated against meningococcal disease. You may also need additional booster doses.   Zoster (shingles). If you are age 60 or older, you should get this vaccine.   Varicella (chickenpox). If you have never had chickenpox or you were vaccinated but received only 1 dose, talk to your caregiver to find out if you need this vaccine.   Hepatitis A. You need this vaccine if you have a specific risk factor for hepatitis A virus infection or you simply wish to be protected from this disease. The vaccine is usually given as 2 doses, 6 to 18 months apart.   Hepatitis B. You need this vaccine if you have a specific risk factor for hepatitis B virus infection or you simply wish to be protected from this disease. The vaccine is given in 3 doses, usually over 6 months.  Preventive Services /  Frequency Ages 19 to 39  Blood pressure check.** / Every 1 to 2 years.   Lipid and cholesterol check.** / Every 5 years beginning at age 20.   Clinical breast exam.** / Every 3 years for women in their 20s and 30s.   Pap test.** / Every 2 years from ages 21 through 29. Every 3 years starting at age 30 through age 65 or 70 with a history of 3 consecutive normal Pap tests.   HPV screening.** / Every 3 years from ages 30 through ages 65 to 70 with a history of 3 consecutive normal Pap tests.   Hepatitis C blood test.** / For any individual with known risks for hepatitis C.   Skin self-exam. / Monthly.   Influenza immunization.** / Every year.   Pneumococcal polysaccharide immunization.** / 1 to 2 doses if you smoke cigarettes or if you have certain chronic medical conditions.   Tetanus, diphtheria, pertussis (Tdap, Td) immunization. / A one-time dose of Tdap vaccine. After that, you need a Td booster dose every 10 years.   HPV immunization. / 3 doses over 6 months, if you are 26 and younger.   Measles, mumps, rubella (MMR) immunization. / You need at least 1 dose of MMR if you were born in 1957 or later. You may also need a second dose.   Meningococcal immunization. / 1 dose if you are age 19 to 21 and a first-year college student living in a residence hall, or have one of several medical conditions, you need to get vaccinated against meningococcal disease. You may also need additional booster doses.   Varicella immunization.** / Consult your caregiver.   Hepatitis A immunization.** / Consult your caregiver. 2 doses, 6 to 18 months   apart.   Hepatitis B immunization.** / Consult your caregiver. 3 doses usually over 6 months.  Ages 40 to 64  Blood pressure check.** / Every 1 to 2 years.   Lipid and cholesterol check.** / Every 5 years beginning at age 20.   Clinical breast exam.** / Every year after age 40.   Mammogram.** / Every year beginning at age 40 and continuing for as  long as you are in good health. Consult with your caregiver.   Pap test.** / Every 3 years starting at age 30 through age 65 or 70 with a history of 3 consecutive normal Pap tests.   HPV screening.** / Every 3 years from ages 30 through ages 65 to 70 with a history of 3 consecutive normal Pap tests.   Fecal occult blood test (FOBT) of stool. / Every year beginning at age 50 and continuing until age 75. You may not need to do this test if you get a colonoscopy every 10 years.   Flexible sigmoidoscopy or colonoscopy.** / Every 5 years for a flexible sigmoidoscopy or every 10 years for a colonoscopy beginning at age 50 and continuing until age 75.   Hepatitis C blood test.** / For all people born from 1945 through 1965 and any individual with known risks for hepatitis C.   Skin self-exam. / Monthly.   Influenza immunization.** / Every year.   Pneumococcal polysaccharide immunization.** / 1 to 2 doses if you smoke cigarettes or if you have certain chronic medical conditions.   Tetanus, diphtheria, pertussis (Tdap, Td) immunization.** / A one-time dose of Tdap vaccine. After that, you need a Td booster dose every 10 years.   Measles, mumps, rubella (MMR) immunization. / You need at least 1 dose of MMR if you were born in 1957 or later. You may also need a second dose.   Varicella immunization.** / Consult your caregiver.   Meningococcal immunization.** / Consult your caregiver.   Hepatitis A immunization.** / Consult your caregiver. 2 doses, 6 to 18 months apart.   Hepatitis B immunization.** / Consult your caregiver. 3 doses, usually over 6 months.  Ages 65 and over  Blood pressure check.** / Every 1 to 2 years.   Lipid and cholesterol check.** / Every 5 years beginning at age 20.   Clinical breast exam.** / Every year after age 40.   Mammogram.** / Every year beginning at age 40 and continuing for as long as you are in good health. Consult with your caregiver.   Pap test.** /  Every 3 years starting at age 30 through age 65 or 70 with a 3 consecutive normal Pap tests. Testing can be stopped between 65 and 70 with 3 consecutive normal Pap tests and no abnormal Pap or HPV tests in the past 10 years.   HPV screening.** / Every 3 years from ages 30 through ages 65 or 70 with a history of 3 consecutive normal Pap tests. Testing can be stopped between 65 and 70 with 3 consecutive normal Pap tests and no abnormal Pap or HPV tests in the past 10 years.   Fecal occult blood test (FOBT) of stool. / Every year beginning at age 50 and continuing until age 75. You may not need to do this test if you get a colonoscopy every 10 years.   Flexible sigmoidoscopy or colonoscopy.** / Every 5 years for a flexible sigmoidoscopy or every 10 years for a colonoscopy beginning at age 50 and continuing until age 75.   Hepatitis   C blood test.** / For all people born from 1945 through 1965 and any individual with known risks for hepatitis C.   Osteoporosis screening.** / A one-time screening for women ages 65 and over and women at risk for fractures or osteoporosis.   Skin self-exam. / Monthly.   Influenza immunization.** / Every year.   Pneumococcal polysaccharide immunization.** / 1 dose at age 65 (or older) if you have never been vaccinated.   Tetanus, diphtheria, pertussis (Tdap, Td) immunization. / A one-time dose of Tdap vaccine if you are over 65 and have contact with an infant, are a healthcare worker, or simply want to be protected from whooping cough. After that, you need a Td booster dose every 10 years.   Varicella immunization.** / Consult your caregiver.   Meningococcal immunization.** / Consult your caregiver.   Hepatitis A immunization.** / Consult your caregiver. 2 doses, 6 to 18 months apart.   Hepatitis B immunization.** / Check with your caregiver. 3 doses, usually over 6 months.  ** Family history and personal history of risk and conditions may change your caregiver's  recommendations. Document Released: 07/08/2001 Document Revised: 05/01/2011 Document Reviewed: 10/07/2010 [employer] Patient Information 2012 [employer]. 

## 2012-01-12 NOTE — Assessment & Plan Note (Signed)
Stable con't meds 

## 2012-01-12 NOTE — Assessment & Plan Note (Signed)
Controlled con't meds  

## 2012-01-12 NOTE — Progress Notes (Signed)
Subjective:     [name] is a 46 y.o. female and is here for a comprehensive physical exam. The patient reports no problems.  History   Social History  . Marital Status: Married    Spouse Name: N/A    Number of Children: 3  . 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 Smoking status: Never Smoker   . Smokeless tobacco: Never Used  . Alcohol Use: No  . Drug Use: No  . Sexually Active: Yes -- Female partner(s)   Other Topics Concern  . Not on file   Social History Narrative  . No narrative on file   Health Maintenance  Topic Date Due  . Mammogram  03/13/2011  . Influenza Vaccine  02/24/2012  . Pap Smear  01/12/2015  . Tetanus/tdap  02/08/2019    The following portions of the patient's history were reviewed and updated as appropriate: allergies, current medications, past family history, past medical history, past social history, past surgical history and problem list.  Review of Systems Review of Systems  Constitutional: Negative for activity change, appetite change and fatigue.  HENT: Negative for hearing loss, congestion, tinnitus and ear discharge.  dentist-due Eyes: Negative for visual disturbance (see optho --going today)       Respiratory: Negative for cough, chest tightness and shortness of breath.   Cardiovascular: Negative for chest pain, palpitations and leg swelling.  Gastrointestinal: Negative for abdominal pain, diarrhea, constipation and abdominal distention.  Genitourinary: Negative for urgency, frequency, decreased urine volume and difficulty urinating.  Musculoskeletal: Negative for back pain, arthralgias and gait problem.  Skin: Negative for color change, pallor and rash.  Neurological: Negative for dizziness, light-headedness, numbness and headaches.  Hematological: Negative for adenopathy. Does not bruise/bleed easily.  Psychiatric/Behavioral: Negative for suicidal ideas, confusion, sleep  disturbance, self-injury, dysphoric mood, decreased concentration and agitation.       Objective:    BP 116/80  Pulse 56  Temp 97.6 F (36.4 C) (Oral)  Ht 5' 3.75" (1.619 m)  Wt 220 lb 12.8 oz (100.154 kg)  BMI 38.20 kg/m2  SpO2 99% General appearance: alert, cooperative, appears stated age and no distress Head: Normocephalic, without obvious abnormality, atraumatic Eyes: conjunctivae/corneas clear. PERRL, EOM's intact. Fundi benign. Ears: normal TM's and external ear canals both ears Nose: Nares normal. Septum midline. Mucosa normal. No drainage or sinus tenderness. Throat: lips, mucosa, and tongue normal; teeth and gums normal Neck: no adenopathy, no carotid bruit, no JVD, supple, symmetrical, trachea midline and thyroid not enlarged, symmetric, no tenderness/mass/nodules Back: symmetric, no curvature. ROM normal. No CVA tenderness. Lungs: clear to auscultation bilaterally Breasts: normal appearance, no masses or tenderness Heart: regular rate and rhythm, S1, S2 normal, no murmur, click, rub or gallop Abdomen: soft, non-tender; bowel sounds normal; no masses,  no organomegaly Pelvic: cervix normal in appearance, external genitalia normal, no adnexal masses or tenderness, no cervical motion tenderness, rectovaginal septum normal, uterus normal size, shape, and consistency and vagina normal without discharge Extremities: extremities normal, atraumatic, no cyanosis or edema Pulses: 2+ and symmetric Skin: Skin color, texture, turgor normal. No rashes or lesions Lymph nodes: Cervical, supraclavicular, and axillary nodes normal. Neurologic: Alert and oriented X 3, normal strength and tone. Normal symmetric reflexes. Normal coordination and gait Psych-- no depression, anxiety   Assessment:    Healthy female exam.      Plan:    check labs mammo overdue  See After Visit  Summary for Counseling Recommendations

## 2012-01-12 NOTE — Addendum Note (Signed)
Addended by: Lelon Perla on: 01/12/2012 12:46 PM   Modules accepted: Orders

## 2012-01-12 NOTE — Telephone Encounter (Signed)
In reference to new referral entered for Screening Colon, patient's last colon was 02/22/2007 with Dr. Sheryn Bison.  Per his recommendation, she should not repeat until 01/2017.  I have made patient aware.  If this is not acceptable, please advise.

## 2012-01-13 NOTE — Telephone Encounter (Signed)
The order was put in on 8/19 during the patient's CPE.      KP

## 2012-01-13 NOTE — Telephone Encounter (Signed)
Then repeat 2018

## 2012-01-23 ENCOUNTER — Ambulatory Visit
Admission: RE | Admit: 2012-01-23 | Discharge: 2012-01-23 | Disposition: A | Discharge status: Home / Self Care | Payer: PRIVATE HEALTH INSURANCE | Source: Ambulatory Visit | Attending: Family Medicine | Admitting: Family Medicine

## 2012-01-23 DIAGNOSIS — Z1239 Encounter for other screening for malignant neoplasm of breast: Secondary | ICD-10-CM

## 2012-01-27 ENCOUNTER — Telehealth: Payer: Self-pay | Admitting: Family Medicine

## 2012-01-27 MED ORDER — LEVOTHYROXINE SODIUM 88 MCG PO TABS: 88.0000 ug | ORAL_TABLET | Freq: Every day | ORAL | Status: DC

## 2012-01-27 NOTE — Telephone Encounter (Signed)
Refill: Levothyroxine 88 mcg tablet. Take 1 tablet by mouth every day. Qty 30. Last fill 12-22-11

## 2012-01-28 ENCOUNTER — Ambulatory Visit (INDEPENDENT_AMBULATORY_CARE_PROVIDER_SITE_OTHER): Payer: PRIVATE HEALTH INSURANCE | Admitting: Family Medicine

## 2012-01-28 VITALS — BP 113/74 | HR 60 | Temp 98.5°F | Resp 17 | Ht 64.0 in | Wt 220.0 lb

## 2012-01-28 DIAGNOSIS — M549 Dorsalgia, unspecified: Secondary | ICD-10-CM

## 2012-01-28 MED ORDER — CYCLOBENZAPRINE HCL 10 MG PO TABS: 10.0000 mg | ORAL_TABLET | Freq: Two times a day (BID) | ORAL | Status: AC | PRN

## 2012-01-28 MED ORDER — HYDROCODONE-ACETAMINOPHEN 5-500 MG PO TABS: 1.0000 | ORAL_TABLET | Freq: Three times a day (TID) | ORAL | Status: AC | PRN

## 2012-01-28 NOTE — Progress Notes (Signed)
[facility] [address] [place] [address] [phone]  Date:  01/28/2012   Name:  [name]   DOB:  Jul 24, 1965   MRN:  [id number]  PCP:  Loreen Freud, DO    Chief Complaint: Back Pain   History of Present Illness:  [name] is a 46 y.o. very pleasant female patient who presents with the following:  Here with back pain- she has lumbar stenosis. Saturday her back pain got worse- no known injury but her back pain got a lot worse.  She has tried heat, ice, aleve, ibuprofen.   She hurts in her lower back.  Occasionally the pain radiates into her right or left hip- but is not running down her legs.  Occasionally she has a little numbness/ tingling into her legs which she has noted for the last few days.  It lasts for a few seconds.  No bowel or bladder control problems.    Patient Active Problem List  Diagnosis  . GOITER, UNSPECIFIED  . HYPOTHYROIDISM  . MORBID OBESITY  . ANEMIA, IRON DEFICIENCY NEC  . ANEMIA  . THROMBOCYTOSIS  . OBSTRUCTIVE SLEEP APNEA  . HYPERTENSION  . DIASTOLIC DYSFUNCTION  . URI  . SINUSITIS  . Allergic rhinitis, cause unspecified  . GERD  . HIATAL HERNIA  . DIVERTICULOSIS, COLON  . IBS  . PAIN IN JOINT, MULTIPLE SITES  . SPINAL STENOSIS, LUMBAR  . LOW BACK PAIN  . MALAISE AND FATIGUE  . HEADACHE  . PALPITATIONS, OCCASIONAL  . CARDIAC MURMUR  . DYSPNEA  . CHEST PAIN  . DIARRHEA  . ECHOCARDIOGRAM, ABNORMAL  . Dysphagia, pharyngoesophageal phase  . Bariatric surgery status  . Esophageal ring    Past Medical History  Diagnosis Date  . Hypertension   . GERD (gastroesophageal reflux disease)   . IBS (irritable bowel syndrome)   . Diastolic dysfunction   . Obstructive sleep apnea   . Goiter   . Diverticulosis of colon   . Anemia, iron deficiency   . Thrombocytosis   . Morbid obesity   . Hiatal hernia   . Allergic rhinitis, cause unspecified   . Undiagnosed cardiac murmurs   . COPD (chronic obstructive  pulmonary disease)     Past Surgical History  Procedure Date  . Lipoma excision     upper back  . Endometrial ablation 12/08  . Bariatric surgery 06/17/2010    History  Substance Use Topics  . Smoking status: Never Smoker   . Smokeless tobacco: Never Used  . Alcohol Use: No    Family History  Problem Relation Age of Onset  . Breast cancer Mother   . Arrhythmia Mother   . Heart disease Mother   . Cancer Mother 68    breast  . Hypertension Father   . Kidney cancer Father   . Colon polyps Father   . Lymphoma Father     Diffused Large B-cell Lymphoma  . Cancer Father     lymphoma  . Colon cancer Neg Hx   . Cancer Maternal Aunt 58    breast cancer    Allergies  Allergen Reactions  . Penicillins     Causes severe, difficult to treat yeast infections    Medication list has been reviewed and updated.  Current Outpatient Prescriptions on File Prior to Visit  Medication Sig Dispense Refill  . hydrochlorothiazide (HYDRODIURIL) 25 MG tablet Take 1 tablet (25 mg total) by mouth daily.  30 tablet  11  .  levothyroxine (SYNTHROID, LEVOTHROID) 88 MCG tablet Take 1 tablet (88 mcg total) by mouth daily.  30 tablet  11  . loratadine-pseudoephedrine (CLARITIN-D 12 HOUR) 5-120 MG per tablet Take 1 tablet by mouth as needed.        . mometasone (NASONEX) 50 MCG/ACT nasal spray Place 2 sprays into the nose daily as needed.        Marland Kitchen omeprazole (PRILOSEC) 20 MG capsule Take 20 mg by mouth daily.        . valsartan (DIOVAN) 40 MG tablet Take 1 tablet (40 mg total) by mouth daily.  30 tablet  2    Review of Systems:  As per HPI- otherwise negative.   Physical Examination: Filed Vitals:   01/28/12 1621  BP: 113/74  Pulse: 60  Temp: 98.5 F (36.9 C)  Resp: 17   Filed Vitals:   01/28/12 1621  Height: 5\' 4"  (1.626 m)  Weight: 220 lb (99.791 kg)   Body mass index is 37.76 kg/(m^2). Ideal Body Weight: Weight in (lb) to have BMI = 25: 145.3   GEN: WDWN, NAD, Non-toxic, A & O  x 3, overweight- she has lost about 80 lbs since her weight loss surgery HEENT: Atraumatic, Normocephalic. Neck supple. No masses, No LAD. Ears and Nose: No external deformity. CV: RRR, No M/G/R. No JVD. No thrill. No extra heart sounds. PULM: CTA B, no wheezes, crackles, rhonchi. No retractions. No resp. distress. No accessory muscle use. ABD: S, NT, ND, +BS. No rebound. No HSM. Back: tenderness lower back, limited flexion.  Legs with normal strength, sensation and DTR.  Negative STR test bilaterally  EXTR: No c/c/e NEURO Normal gait.  PSYCH: Normally interactive. Conversant. Not depressed or anxious appearing.  Calm demeanor.    Assessment and Plan: 1. Back pain  HYDROcodone-acetaminophen (VICODIN) 5-500 MG per tablet, cyclobenzaprine (FLEXERIL) 10 MG tablet  will try treating her conservatively for the time being.  However, if her back pain does not improve she will call or RTC- Sooner if worse.     Abbe Amsterdam, MD

## 2012-01-31 ENCOUNTER — Telehealth: Payer: Self-pay

## 2012-01-31 DIAGNOSIS — M549 Dorsalgia, unspecified: Secondary | ICD-10-CM

## 2012-01-31 MED ORDER — PREDNISONE 20 MG PO TABS: ORAL_TABLET | ORAL | Status: AC

## 2012-01-31 NOTE — Telephone Encounter (Signed)
Called her- she is having some tingling in her left leg, but is not having any bowel or bladder dysfunction.  She would like to try some prednisone- will call this in for her to [facility].  She will let me know if not better in the next few days- Sooner if worse.

## 2012-01-31 NOTE — Telephone Encounter (Signed)
Pt still complains of lower back pain and her left leg is beginning to go numb, pt would like to know if she could possibly be prescribed prednisone or be given a cortisone injection. Best# [phone]  [facility]: [facility] [address]

## 2012-02-04 ENCOUNTER — Telehealth: Payer: Self-pay

## 2012-02-04 DIAGNOSIS — M549 Dorsalgia, unspecified: Secondary | ICD-10-CM

## 2012-02-04 NOTE — Telephone Encounter (Signed)
Dr Patsy Lager, what instr's do you have for pt?

## 2012-02-04 NOTE — Telephone Encounter (Signed)
Called her back.  She has had intra- articular injections in her spine per a doctor at [facility], but not since about 2010.  She would like to try injections again, but would like to see someone closer to home.  She has tried to exercise on the treadmill to help lose weight and this has flared up her pain.  She does not want more oral medications as they not really help.

## 2012-02-04 NOTE — Telephone Encounter (Signed)
Pt states that the pain in her left hip and leg is not any better, pt would like to know if she could possibly get a cortisone shot for the pain. Best# [phone] or cell [phone]

## 2012-02-05 ENCOUNTER — Other Ambulatory Visit: Payer: Self-pay | Admitting: Rehabilitation

## 2012-02-05 ENCOUNTER — Ambulatory Visit
Admission: RE | Admit: 2012-02-05 | Discharge: 2012-02-05 | Disposition: A | Discharge status: Home / Self Care | Payer: PRIVATE HEALTH INSURANCE | Source: Ambulatory Visit | Attending: Rehabilitation | Admitting: Rehabilitation

## 2012-02-05 DIAGNOSIS — M549 Dorsalgia, unspecified: Secondary | ICD-10-CM

## 2012-02-06 ENCOUNTER — Telehealth: Payer: Self-pay

## 2012-02-06 NOTE — Telephone Encounter (Signed)
FOR DR COPLAND: PT WANTED YOU TO KNOW THAT SHE WENT TO SEE DR Noel Gerold REGARDING HER BACK AND THAT IS WHO HER DAUGHTER SEES, THEY SAID SHE HAD A HERNIATED DISC AND IS GOING TO DO SURGERY ON Tuesday AND SHE WANTED YOU TO KNOW. YOU MAY REACH PT AT [phone] IF NEEDED

## 2012-02-10 HISTORY — PX: SPINE SURGERY: SHX786

## 2012-03-31 ENCOUNTER — Other Ambulatory Visit: Payer: Self-pay | Admitting: Family Medicine

## 2012-03-31 MED ORDER — VALSARTAN 40 MG PO TABS: 40.0000 mg | ORAL_TABLET | Freq: Every day | ORAL | Status: DC

## 2012-03-31 NOTE — Telephone Encounter (Signed)
Rx faxed.     MW  

## 2012-07-10 ENCOUNTER — Other Ambulatory Visit: Payer: Self-pay

## 2012-07-14 ENCOUNTER — Ambulatory Visit: Payer: PRIVATE HEALTH INSURANCE | Admitting: Family Medicine

## 2012-07-28 ENCOUNTER — Encounter: Payer: Self-pay | Admitting: Family Medicine

## 2012-07-28 ENCOUNTER — Ambulatory Visit (INDEPENDENT_AMBULATORY_CARE_PROVIDER_SITE_OTHER): Payer: BC Managed Care – PPO | Admitting: Family Medicine

## 2012-07-28 VITALS — BP 126/72 | HR 61 | Temp 98.3°F | Wt 226.6 lb

## 2012-07-28 DIAGNOSIS — N912 Amenorrhea, unspecified: Secondary | ICD-10-CM

## 2012-07-28 DIAGNOSIS — I10 Essential (primary) hypertension: Secondary | ICD-10-CM

## 2012-07-28 LAB — CBC WITH DIFFERENTIAL/PLATELET
Basophils Absolute: 0 10*3/uL (ref 0.0–0.1)
Eosinophils Absolute: 0.2 10*3/uL (ref 0.0–0.7)
MCHC: 33.3 g/dL (ref 30.0–36.0)
MCV: 87.8 fl (ref 78.0–100.0)
Monocytes Absolute: 0.4 10*3/uL (ref 0.1–1.0)
Neutrophils Relative %: 53.7 % (ref 43.0–77.0)
Platelets: 487 10*3/uL — ABNORMAL HIGH (ref 150.0–400.0)

## 2012-07-28 LAB — HEPATIC FUNCTION PANEL
ALT: 17 U/L (ref 0–35)
AST: 22 U/L (ref 0–37)
Bilirubin, Direct: 0 mg/dL (ref 0.0–0.3)
Total Bilirubin: 0.4 mg/dL (ref 0.3–1.2)

## 2012-07-28 LAB — FOLLICLE STIMULATING HORMONE: FSH: 32.4 m[IU]/mL

## 2012-07-28 LAB — LUTEINIZING HORMONE: LH: 50.47 m[IU]/mL

## 2012-07-28 LAB — BASIC METABOLIC PANEL
BUN: 14 mg/dL (ref 6–23)
CO2: 29 mEq/L (ref 19–32)
Chloride: 99 mEq/L (ref 96–112)
Creatinine, Ser: 0.6 mg/dL (ref 0.4–1.2)

## 2012-07-28 LAB — ESTRADIOL: Estradiol: 94.9 pg/mL

## 2012-07-28 MED ORDER — VALSARTAN 40 MG PO TABS: 40.0000 mg | ORAL_TABLET | Freq: Every day | ORAL | Status: DC

## 2012-07-28 NOTE — Patient Instructions (Signed)

## 2012-07-28 NOTE — Progress Notes (Signed)
   Subjective:    Patient here for follow-up of elevated blood pressure.  She is exercising and is adherent to a low-salt diet.  Blood pressure is well controlled at home. Cardiac symptoms: none. Patient denies: chest pain, chest pressure/discomfort, claudication, dyspnea, exertional chest pressure/discomfort, fatigue, irregular heart beat, lower extremity edema, near-syncope, orthopnea, palpitations, paroxysmal nocturnal dyspnea, syncope and tachypnea. Cardiovascular risk factors: hypertension, obesity (BMI >= 30 kg/m2) and sedentary lifestyle. Use of agents associated with hypertension: none. History of target organ damage: none.  The following portions of the patient's history were reviewed and updated as appropriate: allergies, current medications, past family history, past medical history, past social history, past surgical history and problem list.  Review of Systems Pertinent items are noted in HPI.     Objective:    BP 126/72  Pulse 61  Temp(Src) 98.3 F (36.8 C) (Oral)  Wt 226 lb 9.6 oz (102.785 kg)  BMI 38.88 kg/m2  SpO2 99%  LMP 12/20/2010 General appearance: alert, cooperative, appears stated age and no distress Throat: lips, mucosa, and tongue normal; teeth and gums normal Lungs: clear to auscultation bilaterally Heart: S1, S2 normal    Assessment:    Hypertension, normal blood pressure . Evidence of target organ damage: none.   amenorrhea---  Check labs Plan:    Medication: no change. Dietary sodium restriction. Regular aerobic exercise. Check blood pressures 2-3 times weekly and record. Follow up: 6 weeks and as needed.

## 2012-08-03 ENCOUNTER — Telehealth: Payer: Self-pay | Admitting: *Deleted

## 2012-08-03 MED ORDER — POTASSIUM CHLORIDE CRYS ER 20 MEQ PO TBCR: 20.0000 meq | EXTENDED_RELEASE_TABLET | Freq: Every day | ORAL | Status: DC

## 2012-08-03 NOTE — Telephone Encounter (Signed)
Prior Auth denied restricted access medications may be eligible for coverage when one therapeutic equivalent non-restricted access alternative medication has been tried and was ineffective or deemed detrimental to the [employer] health.

## 2012-08-03 NOTE — Telephone Encounter (Signed)
Left msg on [facility] triage insurance will no longer cover her Diovan. Requesting md to rx something else for her BP. Will also be out of her HCTZ will need new prescription as well...Raechel Chute

## 2012-08-03 NOTE — Telephone Encounter (Signed)
Losartan hct 100/ 25  1 po qd #30   Ov 2-3 weeks

## 2012-08-04 ENCOUNTER — Telehealth: Payer: Self-pay | Admitting: Family Medicine

## 2012-08-04 DIAGNOSIS — R609 Edema, unspecified: Secondary | ICD-10-CM

## 2012-08-04 MED ORDER — HYDROCHLOROTHIAZIDE 25 MG PO TABS: 25.0000 mg | ORAL_TABLET | Freq: Every day | ORAL | Status: DC

## 2012-08-04 NOTE — Telephone Encounter (Signed)
refill Hydrochlorothiazide (Tab) 25 MG Take 1 tablet (25 mg total) by mouth daily #30 wt/11-refills last fill 2.14.14 NOTE shows marked out on meds listing, notes discontinued 2.18.13

## 2012-08-10 ENCOUNTER — Other Ambulatory Visit: Payer: Self-pay | Admitting: *Deleted

## 2012-08-10 DIAGNOSIS — I10 Essential (primary) hypertension: Secondary | ICD-10-CM

## 2012-08-10 MED ORDER — LOSARTAN POTASSIUM-HCTZ 100-25 MG PO TABS: 1.0000 | ORAL_TABLET | Freq: Every day | ORAL | Status: DC

## 2012-09-07 ENCOUNTER — Telehealth: Payer: Self-pay | Admitting: Family Medicine

## 2012-09-07 DIAGNOSIS — R609 Edema, unspecified: Secondary | ICD-10-CM

## 2012-09-07 DIAGNOSIS — I10 Essential (primary) hypertension: Secondary | ICD-10-CM

## 2012-09-07 MED ORDER — LEVOTHYROXINE SODIUM 88 MCG PO TABS: 88.0000 ug | ORAL_TABLET | Freq: Every day | ORAL | Status: DC

## 2012-09-07 MED ORDER — POTASSIUM CHLORIDE CRYS ER 20 MEQ PO TBCR: 20.0000 meq | EXTENDED_RELEASE_TABLET | Freq: Every day | ORAL | Status: DC

## 2012-09-07 MED ORDER — LOSARTAN POTASSIUM-HCTZ 100-25 MG PO TABS: 1.0000 | ORAL_TABLET | Freq: Every day | ORAL | Status: DC

## 2012-09-07 NOTE — Telephone Encounter (Signed)
Is the patient supposed to be taking Losartan HCTZ and reg HCTZ?

## 2012-09-07 NOTE — Telephone Encounter (Signed)
25 mg hctz is in bp med.  She should not need hctz by itself

## 2012-09-07 NOTE — Telephone Encounter (Signed)
90 day supply request for: Hydrochlorothiazide 25 mg tab  Levothyroxine 88 mcg tab  Losartan-hctz 100-25 mg tab  Klor-con m20 tab

## 2013-01-28 ENCOUNTER — Ambulatory Visit (INDEPENDENT_AMBULATORY_CARE_PROVIDER_SITE_OTHER): Payer: BC Managed Care – PPO | Admitting: Family Medicine

## 2013-01-28 ENCOUNTER — Encounter: Payer: Self-pay | Admitting: Family Medicine

## 2013-01-28 VITALS — BP 118/72 | HR 56 | Temp 98.4°F | Wt 230.0 lb

## 2013-01-28 DIAGNOSIS — D229 Melanocytic nevi, unspecified: Secondary | ICD-10-CM | POA: Insufficient documentation

## 2013-01-28 DIAGNOSIS — E039 Hypothyroidism, unspecified: Secondary | ICD-10-CM

## 2013-01-28 DIAGNOSIS — L708 Other acne: Secondary | ICD-10-CM

## 2013-01-28 DIAGNOSIS — I10 Essential (primary) hypertension: Secondary | ICD-10-CM

## 2013-01-28 DIAGNOSIS — D239 Other benign neoplasm of skin, unspecified: Secondary | ICD-10-CM

## 2013-01-28 DIAGNOSIS — L709 Acne, unspecified: Secondary | ICD-10-CM

## 2013-01-28 LAB — LIPID PANEL
Cholesterol: 169 mg/dL (ref 0–200)
HDL: 55.2 mg/dL (ref >39.00)
Triglycerides: 113 mg/dL (ref 0.0–149.0)
VLDL: 22.6 mg/dL (ref 0.0–40.0)

## 2013-01-28 LAB — CBC WITH DIFFERENTIAL/PLATELET
Eosinophils Absolute: 0.1 10*3/uL (ref 0.0–0.7)
Eosinophils Relative: 1.6 % (ref 0.0–5.0)
HCT: 38.8 % (ref 36.0–46.0)
Lymphs Abs: 2.6 10*3/uL (ref 0.7–4.0)
MCHC: 33.5 g/dL (ref 30.0–36.0)
MCV: 88.5 fl (ref 78.0–100.0)
Monocytes Absolute: 0.4 10*3/uL (ref 0.1–1.0)
Platelets: 471 10*3/uL — ABNORMAL HIGH (ref 150.0–400.0)
WBC: 6.9 10*3/uL (ref 4.5–10.5)

## 2013-01-28 LAB — HEPATIC FUNCTION PANEL
AST: 18 U/L (ref 0–37)
Albumin: 3.8 g/dL (ref 3.5–5.2)
Total Bilirubin: 0.4 mg/dL (ref 0.3–1.2)

## 2013-01-28 LAB — BASIC METABOLIC PANEL
BUN: 11 mg/dL (ref 6–23)
CO2: 29 mEq/L (ref 19–32)
Calcium: 8.5 mg/dL (ref 8.4–10.5)
Chloride: 96 mEq/L (ref 96–112)
Creatinine, Ser: 0.6 mg/dL (ref 0.4–1.2)
GFR: 148.76 mL/min (ref >60.00)
Glucose, Bld: 74 mg/dL (ref 70–99)
Potassium: 3.2 mEq/L — ABNORMAL LOW (ref 3.5–5.1)
Sodium: 133 mEq/L — ABNORMAL LOW (ref 135–145)

## 2013-01-28 MED ORDER — POTASSIUM CHLORIDE CRYS ER 20 MEQ PO TBCR: 20.0000 meq | EXTENDED_RELEASE_TABLET | Freq: Every day | ORAL | Status: DC

## 2013-01-28 MED ORDER — TRETINOIN MICROSPHERE 0.04 % EX GEL: Freq: Every day | CUTANEOUS | Status: DC

## 2013-01-28 MED ORDER — LEVOTHYROXINE SODIUM 88 MCG PO TABS: 88.0000 ug | ORAL_TABLET | Freq: Every day | ORAL | Status: DC

## 2013-01-28 MED ORDER — LOSARTAN POTASSIUM-HCTZ 100-25 MG PO TABS: 1.0000 | ORAL_TABLET | Freq: Every day | ORAL | Status: DC

## 2013-01-28 MED ORDER — CLINDAMYCIN PHOS-BENZOYL PEROX 1-5 % EX GEL: Freq: Two times a day (BID) | CUTANEOUS | Status: DC

## 2013-01-28 NOTE — Assessment & Plan Note (Signed)
con't meds  Check labs 

## 2013-01-28 NOTE — Assessment & Plan Note (Signed)
Stable Cont meds 

## 2013-01-28 NOTE — Assessment & Plan Note (Signed)
Pt to rto for removal

## 2013-01-28 NOTE — Patient Instructions (Signed)

## 2013-01-28 NOTE — Progress Notes (Signed)
  Subjective:    Patient here for follow-up of elevated blood pressure.  She is exercising and is adherent to a low-salt diet.  Blood pressure is well controlled at home. Cardiac symptoms: none. Patient denies: chest pain, chest pressure/discomfort, claudication, dyspnea, exertional chest pressure/discomfort, fatigue, irregular heart beat, lower extremity edema, near-syncope, orthopnea, palpitations, paroxysmal nocturnal dyspnea, syncope and tachypnea. Cardiovascular risk factors: hypertension, obesity (BMI >= 30 kg/m2) and sedentary lifestyle. Use of agents associated with hypertension: none. History of target organ damage: none. Pt also wanted [place] to take a look at a mole on back l arm and needs refills on meds.  The following portions of the patient's history were reviewed and updated as appropriate: allergies, current medications, past family history, past medical history, past social history, past surgical history and problem list.  Review of Systems Pertinent items are noted in HPI.     Objective:    BP 118/72  Pulse 56  Temp(Src) 98.4 F (36.9 C) (Oral)  Wt 230 lb (104.327 kg)  BMI 39.46 kg/m2  SpO2 97%  LMP 12/20/2010 General appearance: alert, cooperative, appears stated age and no distress Neck: no adenopathy, no carotid bruit, no JVD, supple, symmetrical, trachea midline and thyroid not enlarged, symmetric, no tenderness/mass/nodules Lungs: clear to auscultation bilaterally Heart: S1, S2 normal Extremities: extremities normal, atraumatic, no cyanosis or edema Skin: nevi - arm(s) left    Assessment:    Hypertension, normal blood pressure . Evidence of target organ damage: none.    Plan:    Medication: no change. Dietary sodium restriction. Regular aerobic exercise. Check blood pressures 2-3 times weekly and record. Follow up: 6 months and as needed.

## 2013-01-31 ENCOUNTER — Telehealth: Payer: Self-pay | Admitting: *Deleted

## 2013-01-31 NOTE — Telephone Encounter (Signed)
Prior Auth was needed for patients medication tretinon gel.  Prior Berkley Harvey was started on 01/31/13 Ag cma

## 2013-02-01 ENCOUNTER — Encounter: Payer: Self-pay | Admitting: General Practice

## 2013-02-07 ENCOUNTER — Other Ambulatory Visit: Payer: Self-pay | Admitting: *Deleted

## 2013-02-07 NOTE — Telephone Encounter (Signed)
I have contacted [employer] about prior auth that was submitted 01/31/2013.  Patient [name]  was denied 02/03/2013.  Ag cma

## 2013-03-03 ENCOUNTER — Ambulatory Visit: Payer: BC Managed Care – PPO | Admitting: Family Medicine

## 2013-03-29 ENCOUNTER — Other Ambulatory Visit: Payer: Self-pay | Admitting: Obstetrics and Gynecology

## 2013-03-31 ENCOUNTER — Other Ambulatory Visit: Payer: Self-pay

## 2013-05-26 HISTORY — PX: ABDOMINAL HYSTERECTOMY: SHX81

## 2013-06-07 ENCOUNTER — Other Ambulatory Visit: Payer: Self-pay | Admitting: Family Medicine

## 2013-07-25 ENCOUNTER — Ambulatory Visit (INDEPENDENT_AMBULATORY_CARE_PROVIDER_SITE_OTHER): Payer: BC Managed Care – PPO | Admitting: Family Medicine

## 2013-07-25 ENCOUNTER — Other Ambulatory Visit: Payer: Self-pay | Admitting: Family Medicine

## 2013-07-25 ENCOUNTER — Encounter: Payer: Self-pay | Admitting: Family Medicine

## 2013-07-25 VITALS — BP 116/70 | HR 64 | Temp 98.1°F | Wt 229.2 lb

## 2013-07-25 DIAGNOSIS — E039 Hypothyroidism, unspecified: Secondary | ICD-10-CM

## 2013-07-25 DIAGNOSIS — N63 Unspecified lump in unspecified breast: Secondary | ICD-10-CM

## 2013-07-25 DIAGNOSIS — I10 Essential (primary) hypertension: Secondary | ICD-10-CM

## 2013-07-25 DIAGNOSIS — E669 Obesity, unspecified: Secondary | ICD-10-CM | POA: Insufficient documentation

## 2013-07-25 LAB — LIPID PANEL
CHOL/HDL RATIO: 3
Cholesterol: 177 mg/dL (ref 0–200)
HDL: 56.3 mg/dL (ref >39.00)
LDL Cholesterol: 105 mg/dL — ABNORMAL HIGH (ref 0–99)
Triglycerides: 77 mg/dL (ref 0.0–149.0)
VLDL: 15.4 mg/dL (ref 0.0–40.0)

## 2013-07-25 LAB — CBC WITH DIFFERENTIAL/PLATELET
BASOS ABS: 0.1 10*3/uL (ref 0.0–0.1)
BASOS PCT: 0.8 % (ref 0.0–3.0)
Eosinophils Absolute: 0.2 10*3/uL (ref 0.0–0.7)
Eosinophils Relative: 2.3 % (ref 0.0–5.0)
HEMATOCRIT: 40.3 % (ref 36.0–46.0)
HEMOGLOBIN: 13.2 g/dL (ref 12.0–15.0)
LYMPHS ABS: 2.4 10*3/uL (ref 0.7–4.0)
LYMPHS PCT: 35.8 % (ref 12.0–46.0)
MCHC: 32.9 g/dL (ref 30.0–36.0)
MCV: 90.4 fl (ref 78.0–100.0)
MONOS PCT: 5.3 % (ref 3.0–12.0)
Monocytes Absolute: 0.4 10*3/uL (ref 0.1–1.0)
NEUTROS ABS: 3.7 10*3/uL (ref 1.4–7.7)
Neutrophils Relative %: 55.8 % (ref 43.0–77.0)
Platelets: 496 10*3/uL — ABNORMAL HIGH (ref 150.0–400.0)
RBC: 4.45 Mil/uL (ref 3.87–5.11)
RDW: 14.1 % (ref 11.5–14.6)
WBC: 6.7 10*3/uL (ref 4.5–10.5)

## 2013-07-25 LAB — HEPATIC FUNCTION PANEL
ALT: 16 U/L (ref 0–35)
AST: 20 U/L (ref 0–37)
Albumin: 3.9 g/dL (ref 3.5–5.2)
Alkaline Phosphatase: 108 U/L (ref 39–117)
BILIRUBIN DIRECT: 0 mg/dL (ref 0.0–0.3)
TOTAL PROTEIN: 7.2 g/dL (ref 6.0–8.3)
Total Bilirubin: 0.4 mg/dL (ref 0.3–1.2)

## 2013-07-25 LAB — BASIC METABOLIC PANEL
BUN: 11 mg/dL (ref 6–23)
CALCIUM: 8.6 mg/dL (ref 8.4–10.5)
CHLORIDE: 100 meq/L (ref 96–112)
CO2: 30 meq/L (ref 19–32)
CREATININE: 0.6 mg/dL (ref 0.4–1.2)
GFR: 151.57 mL/min (ref >60.00)
GLUCOSE: 79 mg/dL (ref 70–99)
Potassium: 3.2 mEq/L — ABNORMAL LOW (ref 3.5–5.1)
Sodium: 137 mEq/L (ref 135–145)

## 2013-07-25 LAB — T3, FREE: T3, Free: 3 pg/mL (ref 2.3–4.2)

## 2013-07-25 LAB — TSH: TSH: 0.88 u[IU]/mL (ref 0.35–5.50)

## 2013-07-25 LAB — T4, FREE: FREE T4: 1.1 ng/dL (ref 0.60–1.60)

## 2013-07-25 NOTE — Progress Notes (Signed)
  Subjective:    Patient here for follow-up of elevated blood pressure.  She is exercising and is adherent to a low-salt diet.  Blood pressure is well controlled at home. Cardiac symptoms: none. Patient denies: chest pain, chest pressure/discomfort, claudication, dyspnea, exertional chest pressure/discomfort, fatigue, irregular heart beat, lower extremity edema, near-syncope, orthopnea, palpitations, paroxysmal nocturnal dyspnea, syncope and tachypnea. Cardiovascular risk factors: hypertension and obesity (BMI >= 30 kg/m2). Use of agents associated with hypertension: none. History of target organ damage: none.  Pt is also c/o mass in R breast.  Non tender.    The following portions of the patient's history were reviewed and updated as appropriate: allergies, current medications, past family history, past medical history, past social history, past surgical history and problem list.  Review of Systems Pertinent items are noted in HPI.     Objective:    BP 116/70  Pulse 64  Temp(Src) 98.1 F (36.7 C) (Oral)  Wt 229 lb 3.2 oz (103.964 kg)  SpO2 98%  LMP 12/20/2010 General appearance: alert, cooperative, appears stated age and no distress Ears: normal TM's and external ear canals both ears Nose: Nares normal. Septum midline. Mucosa normal. No drainage or sinus tenderness. Throat: lips, mucosa, and tongue normal; teeth and gums normal Lungs: clear to auscultation bilaterally Breasts: positive findings: dime size nodule R breast --upper outer quadrant, non tender----- L breast normal Heart: S1, S2 normal Extremities: extremities normal, atraumatic, no cyanosis or edema    Assessment:    Hypertension, normal blood pressure . Evidence of target organ damage: none.    Plan:    Medication: no change. Dietary sodium restriction. Regular aerobic exercise. Check blood pressures 2-3 times weekly and record. Follow up: 6 months and as needed.

## 2013-07-25 NOTE — Assessment & Plan Note (Signed)
Diagnostic mammogram

## 2013-07-25 NOTE — Assessment & Plan Note (Signed)
Check labs 

## 2013-07-25 NOTE — Patient Instructions (Signed)

## 2013-07-25 NOTE — Progress Notes (Signed)
Pre visit review using our clinic review tool, if applicable. No additional management support is needed unless otherwise documented below in the visit note. 

## 2013-07-26 ENCOUNTER — Telehealth: Payer: Self-pay | Admitting: Family Medicine

## 2013-07-26 NOTE — Telephone Encounter (Signed)
Relevant patient education assigned to patient using Emmi. ° °

## 2013-07-28 ENCOUNTER — Encounter: Payer: Self-pay | Admitting: *Deleted

## 2013-07-28 NOTE — Progress Notes (Signed)
Letter sent to patient with lab results.

## 2013-08-08 ENCOUNTER — Ambulatory Visit
Admission: RE | Admit: 2013-08-08 | Discharge: 2013-08-08 | Disposition: A | Discharge status: Home / Self Care | Payer: BC Managed Care – PPO | Source: Ambulatory Visit | Attending: Family Medicine | Admitting: Family Medicine

## 2013-08-08 DIAGNOSIS — N63 Unspecified lump in unspecified breast: Secondary | ICD-10-CM

## 2013-08-17 ENCOUNTER — Other Ambulatory Visit: Payer: Self-pay | Admitting: Family Medicine

## 2013-09-26 ENCOUNTER — Other Ambulatory Visit: Payer: Self-pay | Admitting: Family Medicine

## 2013-12-03 ENCOUNTER — Other Ambulatory Visit: Payer: Self-pay | Admitting: Family Medicine

## 2014-01-09 ENCOUNTER — Other Ambulatory Visit: Payer: Self-pay | Admitting: Family Medicine

## 2014-01-18 ENCOUNTER — Ambulatory Visit (INDEPENDENT_AMBULATORY_CARE_PROVIDER_SITE_OTHER): Payer: BC Managed Care – PPO | Admitting: Family Medicine

## 2014-01-18 ENCOUNTER — Encounter: Payer: Self-pay | Admitting: Family Medicine

## 2014-01-18 VITALS — BP 112/68 | HR 59 | Temp 98.2°F | Wt 229.0 lb

## 2014-01-18 DIAGNOSIS — E039 Hypothyroidism, unspecified: Secondary | ICD-10-CM

## 2014-01-18 DIAGNOSIS — R8290 Unspecified abnormal findings in urine: Secondary | ICD-10-CM

## 2014-01-18 DIAGNOSIS — R5381 Other malaise: Secondary | ICD-10-CM

## 2014-01-18 DIAGNOSIS — R5383 Other fatigue: Secondary | ICD-10-CM

## 2014-01-18 DIAGNOSIS — R82998 Other abnormal findings in urine: Secondary | ICD-10-CM

## 2014-01-18 DIAGNOSIS — I10 Essential (primary) hypertension: Secondary | ICD-10-CM

## 2014-01-18 DIAGNOSIS — N939 Abnormal uterine and vaginal bleeding, unspecified: Secondary | ICD-10-CM

## 2014-01-18 DIAGNOSIS — N898 Other specified noninflammatory disorders of vagina: Secondary | ICD-10-CM

## 2014-01-18 DIAGNOSIS — E785 Hyperlipidemia, unspecified: Secondary | ICD-10-CM

## 2014-01-18 LAB — POCT URINALYSIS DIPSTICK
BILIRUBIN UA: NEGATIVE
Glucose, UA: NEGATIVE
Ketones, UA: NEGATIVE
NITRITE UA: NEGATIVE
PH UA: 7
Protein, UA: NEGATIVE
RBC UA: NEGATIVE
SPEC GRAV UA: 1.01
Urobilinogen, UA: 0.2

## 2014-01-18 LAB — IBC PANEL
Iron: 95 ug/dL (ref 42–145)
Saturation Ratios: 28.9 % (ref 20.0–50.0)
Transferrin: 235.1 mg/dL (ref 212.0–360.0)

## 2014-01-18 LAB — CBC WITH DIFFERENTIAL/PLATELET
BASOS PCT: 0.5 % (ref 0.0–3.0)
Basophils Absolute: 0 10*3/uL (ref 0.0–0.1)
EOS PCT: 1 % (ref 0.0–5.0)
Eosinophils Absolute: 0.1 10*3/uL (ref 0.0–0.7)
HCT: 40.3 % (ref 36.0–46.0)
Hemoglobin: 13.3 g/dL (ref 12.0–15.0)
Lymphocytes Relative: 34.9 % (ref 12.0–46.0)
Lymphs Abs: 2.8 10*3/uL (ref 0.7–4.0)
MCHC: 33 g/dL (ref 30.0–36.0)
MCV: 90 fl (ref 78.0–100.0)
MONOS PCT: 6.5 % (ref 3.0–12.0)
Monocytes Absolute: 0.5 10*3/uL (ref 0.1–1.0)
NEUTROS PCT: 57.1 % (ref 43.0–77.0)
Neutro Abs: 4.6 10*3/uL (ref 1.4–7.7)
Platelets: 487 10*3/uL — ABNORMAL HIGH (ref 150.0–400.0)
RBC: 4.48 Mil/uL (ref 3.87–5.11)
RDW: 13.9 % (ref 11.5–15.5)
WBC: 8.1 10*3/uL (ref 4.0–10.5)

## 2014-01-18 LAB — LIPID PANEL
CHOL/HDL RATIO: 3
Cholesterol: 186 mg/dL (ref 0–200)
HDL: 54.8 mg/dL (ref >39.00)
LDL Cholesterol: 116 mg/dL — ABNORMAL HIGH (ref 0–99)
NonHDL: 131.2
TRIGLYCERIDES: 77 mg/dL (ref 0.0–149.0)
VLDL: 15.4 mg/dL (ref 0.0–40.0)

## 2014-01-18 LAB — HEPATIC FUNCTION PANEL
ALK PHOS: 111 U/L (ref 39–117)
ALT: 13 U/L (ref 0–35)
AST: 21 U/L (ref 0–37)
Albumin: 3.9 g/dL (ref 3.5–5.2)
Bilirubin, Direct: 0 mg/dL (ref 0.0–0.3)
Total Bilirubin: 0.6 mg/dL (ref 0.2–1.2)
Total Protein: 7.6 g/dL (ref 6.0–8.3)

## 2014-01-18 LAB — BASIC METABOLIC PANEL
BUN: 11 mg/dL (ref 6–23)
CO2: 25 mEq/L (ref 19–32)
Calcium: 8.6 mg/dL (ref 8.4–10.5)
Chloride: 98 mEq/L (ref 96–112)
Creatinine, Ser: 0.6 mg/dL (ref 0.4–1.2)
GFR: 131.73 mL/min (ref >60.00)
Glucose, Bld: 80 mg/dL (ref 70–99)
POTASSIUM: 3.5 meq/L (ref 3.5–5.1)
SODIUM: 132 meq/L — AB (ref 135–145)

## 2014-01-18 LAB — VITAMIN B12: Vitamin B-12: 1027 pg/mL — ABNORMAL HIGH (ref 211–911)

## 2014-01-18 LAB — TSH: TSH: 1.36 u[IU]/mL (ref 0.35–4.50)

## 2014-01-18 MED ORDER — LOSARTAN POTASSIUM 100 MG PO TABS: 100.0000 mg | ORAL_TABLET | Freq: Every day | ORAL | Status: DC

## 2014-01-18 NOTE — Addendum Note (Signed)
Addended by: Modena Morrow D on: 01/18/2014 04:58 PM   Modules accepted: Orders

## 2014-01-18 NOTE — Patient Instructions (Signed)

## 2014-01-18 NOTE — Progress Notes (Signed)
Pre visit review using our clinic review tool, if applicable. No additional management support is needed unless otherwise documented below in the visit note. 

## 2014-01-18 NOTE — Progress Notes (Signed)
   Subjective:    Patient ID: [name], female    DOB: 07/14/1965, 49 y.o.   MRN: [id number]  HPI Pt is here for f/u but also has had vaginal bleeding -- her gyn said she needed labs and was given the option of going back there or come here.    Pt needs her regular labs as well.  Pt has abd cramping and bloating and c/o fatigue.  She is not bleeding now.    Review of Systems As above    Objective:   Physical Exam BP 112/68  Pulse 59  Temp(Src) 98.2 F (36.8 C) (Oral)  Wt 229 lb (103.874 kg)  SpO2 97%  LMP 12/20/2010 General appearance: alert, cooperative, appears stated age and no distress Neck: no adenopathy, supple, symmetrical, trachea midline and thyroid not enlarged, symmetric, no tenderness/mass/nodules Lungs: clear to auscultation bilaterally Heart: S1, S2 normal Extremities: extremities normal, atraumatic, no cyanosis or edema       Assessment & Plan:  1. Vaginal bleeding Pt has fibroids and will have a biopsy next month--- ablation was done - CBC with Differential - IBC panel - Ferritin; Future  2. Other malaise and fatigue Check labs - Vitamin B12  3. Essential hypertension Stable-- change hyzaar to cozaar secondary to low K and cramps. - Basic metabolic panel - POCT urinalysis dipstick - losartan (COZAAR) 100 MG tablet; Take 1 tablet (100 mg total) by mouth daily.  Dispense: 90 tablet; Refill: 3  4. Other and unspecified hyperlipidemia Check labs - Hepatic function panel - Lipid panel  5. Unspecified hypothyroidism con't meds-- check labs - TSH

## 2014-01-21 LAB — URINE CULTURE

## 2014-01-24 MED ORDER — NITROFURANTOIN MONOHYD MACRO 100 MG PO CAPS: 100.0000 mg | ORAL_CAPSULE | Freq: Two times a day (BID) | ORAL | Status: DC

## 2014-01-31 ENCOUNTER — Ambulatory Visit: Payer: BC Managed Care – PPO | Admitting: Family Medicine

## 2014-03-27 ENCOUNTER — Ambulatory Visit (INDEPENDENT_AMBULATORY_CARE_PROVIDER_SITE_OTHER): Payer: BC Managed Care – PPO | Admitting: Physician Assistant

## 2014-03-27 VITALS — BP 126/76 | HR 73 | Temp 98.7°F | Resp 17 | Ht 64.0 in | Wt 232.0 lb

## 2014-03-27 DIAGNOSIS — R590 Localized enlarged lymph nodes: Secondary | ICD-10-CM

## 2014-03-27 DIAGNOSIS — J069 Acute upper respiratory infection, unspecified: Secondary | ICD-10-CM

## 2014-03-27 DIAGNOSIS — B9789 Other viral agents as the cause of diseases classified elsewhere: Principal | ICD-10-CM

## 2014-03-27 DIAGNOSIS — R05 Cough: Secondary | ICD-10-CM

## 2014-03-27 DIAGNOSIS — R599 Enlarged lymph nodes, unspecified: Secondary | ICD-10-CM

## 2014-03-27 DIAGNOSIS — R059 Cough, unspecified: Secondary | ICD-10-CM

## 2014-03-27 LAB — POCT RAPID STREP A (OFFICE): Rapid Strep A Screen: NEGATIVE

## 2014-03-27 MED ORDER — IPRATROPIUM BROMIDE 0.03 % NA SOLN: 2.0000 | Freq: Two times a day (BID) | NASAL | Status: DC

## 2014-03-27 MED ORDER — BENZONATATE 100 MG PO CAPS: 100.0000 mg | ORAL_CAPSULE | Freq: Three times a day (TID) | ORAL | Status: DC | PRN

## 2014-03-27 MED ORDER — GUAIFENESIN ER 1200 MG PO TB12: 1.0000 | ORAL_TABLET | Freq: Two times a day (BID) | ORAL | Status: DC | PRN

## 2014-03-27 NOTE — Progress Notes (Signed)
I have discussed this case with Mr. Mani, PA-C and agree.  

## 2014-03-27 NOTE — Progress Notes (Signed)
Subjective:    Patient ID: [name], female    DOB: 1965-12-07, 48 y.o.   MRN: [id number]  HPI  [name] is a 48 y.o. female presenting for 4 day history of swollen lymph nodes. Associated symptoms include sinus pain, congestion cough intermittently productive with thick green sputum, itchy/watery eyes, difficulty swallowing, intermittent fevers (up to 100F 2 days ago), fatigue, airway is starting to feel a bit sore from consistent cough. Denies ear pain, headaches, rhinorrhea, chest pain, sob, difficulty breathing, n/v, diarrhea, abdominal pain, myalgias. Using Claritin daily, Sudafed a couple of times, Corcidin with minimal relief. Patient has had 2 sick contacts, daughter had strep throat 2 weeks ago, husband had a cold. Denies smoking or drinking alcohol. Admits history of GERD, these symptoms are different than her reflux. Denies history of asthma. Denies any other aggravating or relieving factors, no other questions or concerns.   Prior to Admission medications   Medication Sig Start Date End Date Taking? Authorizing Provider  hydrochlorothiazide (HYDRODIURIL) 25 MG tablet TAKE 1 TABLET (25 MG TOTAL) BY MOUTH DAILY. 01/09/14  Yes Rosalita Chessman, DO  levothyroxine (SYNTHROID, LEVOTHROID) 88 MCG tablet TAKE 1 TABLET (88 MCG TOTAL) BY MOUTH DAILY. 01/09/14  Yes Yvonne R Lowne, DO  loratadine-pseudoephedrine (CLARITIN-D 12 HOUR) 5-120 MG per tablet Take 1 tablet by mouth as needed.     Yes Historical Provider, MD  losartan (COZAAR) 100 MG tablet Take 1 tablet (100 mg total) by mouth daily. 01/18/14  Yes Alferd Apa Lowne, DO  omeprazole (PRILOSEC) 20 MG capsule Take 20 mg by mouth daily.     Yes Historical Provider, MD  potassium chloride SA (KLOR-CON M20) 20 MEQ tablet TAKE 2 TABLET BY MOUTH EVERY DAY   Yes Yvonne R Lowne, DO  tretinoin microspheres (RETIN-A MICRO) 0.04 % gel Apply topically at bedtime. 01/28/13  Yes Rosalita Chessman, DO  clindamycin-benzoyl peroxide (BENZACLIN) gel Apply  topically 2 (two) times daily. 01/28/13   Alferd Apa Lowne, DO  mometasone (NASONEX) 50 MCG/ACT nasal spray Place 2 sprays into the nose daily as needed.      Historical Provider, MD  nitrofurantoin, macrocrystal-monohydrate, (MACROBID) 100 MG capsule Take 1 capsule (100 mg total) by mouth 2 (two) times daily. 01/24/14   Rosalita Chessman, DO    Allergies  Allergen Reactions  . Penicillins     Causes severe, difficult to treat yeast infections    Review of Systems As in subjective.    Objective:   Physical Exam  Constitutional: She is oriented to person, place, and time. She appears well-developed and well-nourished. No distress.  BP 126/76 mmHg  Pulse 73  Temp(Src) 98.7 F (37.1 C) (Oral)  Resp 17  Ht 5\' 4"  (1.626 m)  Wt 232 lb (105.235 kg)  BMI 39.80 kg/m2  SpO2 97%  LMP 12/20/2010   HENT:  Head: Normocephalic and atraumatic.  Right Ear: External ear normal.  Left Ear: External ear normal.  Nose: Mucosal edema (mild erythema) and sinus tenderness (mild maxillary sinus tenderness bilaterally) present. No rhinorrhea.  Mouth/Throat: Oropharynx is clear and moist. No oropharyngeal exudate.  Eyes: Conjunctivae and EOM are normal. Pupils are equal, round, and reactive to light. Right eye exhibits no discharge. Left eye exhibits no discharge.  Neck: Normal range of motion. Neck supple. No thyromegaly present.  Cardiovascular: Normal rate, regular rhythm, normal heart sounds and intact distal pulses.  Exam reveals no gallop and no friction rub.   No murmur heard. Pulmonary/Chest: Effort normal  and breath sounds normal. No stridor. No respiratory distress. She has no wheezes. She exhibits no tenderness.  Abdominal: Soft. Bowel sounds are normal. She exhibits no mass. There is no tenderness.  Lymphadenopathy:    She has no cervical adenopathy.  Neurological: She is alert and oriented to person, place, and time.  Skin: Skin is warm and dry. No rash noted. She is not diaphoretic. No erythema.    Results for orders placed or performed in visit on 03/27/14 (from the past 24 hour(s))  POCT rapid strep A     Status: Normal   Collection Time: 03/27/14  9:51 AM  Result Value Ref Range   Rapid Strep A Screen Negative Negative      Assessment & Plan:   1. Viral URI with cough 2. Anterior cervical adenopathy 3. Cough Rapid strep = negative, PE and HPI suggest viral URI, advised plenty fluids, rest, return to clinic if symptoms worsen, fail to resolve or as needed - POCT rapid strep A - Culture, Group A Strep - benzonatate (TESSALON) 100 MG capsule; Take 1-2 capsules (100-200 mg total) by mouth 3 (three) times daily as needed for cough.  Dispense: 40 capsule; Refill: 0 - ipratropium (ATROVENT) 0.03 % nasal spray; Place 2 sprays into both nostrils 2 (two) times daily.  Dispense: 30 mL; Refill: 1 - Guaifenesin (MUCINEX MAXIMUM STRENGTH) 1200 MG TB12; Take 1 tablet (1,200 mg total) by mouth every 12 (twelve) hours as needed.  Dispense: 30 tablet; Refill: [place], PA-C [facility] [phone] 03/27/2014 9:55 AM

## 2014-03-27 NOTE — Patient Instructions (Signed)
Upper Respiratory Infection, Adult An upper respiratory infection (URI) is also sometimes known as the common cold. The upper respiratory tract includes the nose, sinuses, throat, trachea, and bronchi. Bronchi are the airways leading to the lungs. Most people improve within 1 week, but symptoms can last up to 2 weeks. A residual cough may last even longer.  CAUSES Many different viruses can infect the tissues lining the upper respiratory tract. The tissues become irritated and inflamed and often become very moist. Mucus production is also common. A cold is contagious. You can easily spread the virus to others by oral contact. This includes kissing, sharing a glass, coughing, or sneezing. Touching your mouth or nose and then touching a surface, which is then touched by another person, can also spread the virus. SYMPTOMS  Symptoms typically develop 1 to 3 days after you come in contact with a cold virus. Symptoms vary from person to person. They may include:  Runny nose.  Sneezing.  Nasal congestion.  Sinus irritation.  Sore throat.  Loss of voice (laryngitis).  Cough.  Fatigue.  Muscle aches.  Loss of appetite.  Headache.  Low-grade fever. DIAGNOSIS  You might diagnose your own cold based on familiar symptoms, since most people get a cold 2 to 3 times a year. Your caregiver can confirm this based on your exam. Most importantly, your caregiver can check that your symptoms are not due to another disease such as strep throat, sinusitis, pneumonia, asthma, or epiglottitis. Blood tests, throat tests, and X-rays are not necessary to diagnose a common cold, but they may sometimes be helpful in excluding other more serious diseases. Your caregiver will decide if any further tests are required. RISKS AND COMPLICATIONS  You may be at risk for a more severe case of the common cold if you smoke cigarettes, have chronic heart disease (such as heart failure) or lung disease (such as asthma), or if  you have a weakened immune system. The very young and very old are also at risk for more serious infections. Bacterial sinusitis, middle ear infections, and bacterial pneumonia can complicate the common cold. The common cold can worsen asthma and chronic obstructive pulmonary disease (COPD). Sometimes, these complications can require emergency medical care and may be life-threatening. PREVENTION  The best way to protect against getting a cold is to practice good hygiene. Avoid oral or hand contact with people with cold symptoms. Wash your hands often if contact occurs. There is no clear evidence that vitamin C, vitamin E, echinacea, or exercise reduces the chance of developing a cold. However, it is always recommended to get plenty of rest and practice good nutrition. TREATMENT  Treatment is directed at relieving symptoms. There is no cure. Antibiotics are not effective, because the infection is caused by a virus, not by bacteria. Treatment may include:  Increased fluid intake. Sports drinks offer valuable electrolytes, sugars, and fluids.  Breathing heated mist or steam (vaporizer or shower).  Eating chicken soup or other clear broths, and maintaining good nutrition.  Getting plenty of rest.  Using gargles or lozenges for comfort.  Controlling fevers with ibuprofen or acetaminophen as directed by your caregiver.  Increasing usage of your inhaler if you have asthma. Zinc gel and zinc lozenges, taken in the first 24 hours of the common cold, can shorten the duration and lessen the severity of symptoms. Pain medicines may help with fever, muscle aches, and throat pain. A variety of non-prescription medicines are available to treat congestion and runny nose. Your caregiver   can make recommendations and may suggest nasal or lung inhalers for other symptoms.  HOME CARE INSTRUCTIONS   Only take over-the-counter or prescription medicines for pain, discomfort, or fever as directed by your  caregiver.  Use a warm mist humidifier or inhale steam from a shower to increase air moisture. This may keep secretions moist and make it easier to breathe.  Drink enough water and fluids to keep your urine clear or pale yellow.  Rest as needed.  Return to work when your temperature has returned to normal or as your caregiver advises. You may need to stay home longer to avoid infecting others. You can also use a face mask and careful hand washing to prevent spread of the virus. SEEK MEDICAL CARE IF:   After the first few days, you feel you are getting worse rather than better.  You need your caregiver's advice about medicines to control symptoms.  You develop chills, worsening shortness of breath, or brown or red sputum. These may be signs of pneumonia.  You develop yellow or brown nasal discharge or pain in the face, especially when you bend forward. These may be signs of sinusitis.  You develop a fever, swollen neck glands, pain with swallowing, or white areas in the back of your throat. These may be signs of strep throat. SEEK IMMEDIATE MEDICAL CARE IF:   You have a fever.  You develop severe or persistent headache, ear pain, sinus pain, or chest pain.  You develop wheezing, a prolonged cough, cough up blood, or have a change in your usual mucus (if you have chronic lung disease).  You develop sore muscles or a stiff neck. Document Released: 11/05/2000 Document Revised: 08/04/2011 Document Reviewed: 08/17/2013 [employer] Patient Information 2015 [employer]. This information is not intended to replace advice given to you by your health care provider. Make sure you discuss any questions you have with your health care provider.  

## 2014-03-27 NOTE — Progress Notes (Deleted)
   Subjective:    Patient ID: [name], female    DOB: 10-28-1965, 48 y.o.   MRN: [id number]  HPI    Review of Systems     Objective:   Physical Exam        Assessment & Plan:

## 2014-03-29 LAB — CULTURE, GROUP A STREP: ORGANISM ID, BACTERIA: NORMAL

## 2014-04-03 ENCOUNTER — Telehealth: Payer: Self-pay

## 2014-04-03 ENCOUNTER — Telehealth: Payer: Self-pay | Admitting: Family Medicine

## 2014-04-03 NOTE — Telephone Encounter (Signed)
Spoke with patient and she has been having issues with her veins that is painful in her right arm.  Eval scheduled for tomorrow    KP

## 2014-04-03 NOTE — Telephone Encounter (Signed)
Patient says her throat is not getting any better

## 2014-04-03 NOTE — Telephone Encounter (Signed)
Caller name: Alizandra, Loh Relation to pt: self  Call back number: [phone]   Reason for call:  Pt would like to discuss b12 levels she stated it was high.

## 2014-04-04 ENCOUNTER — Encounter: Payer: Self-pay | Admitting: Family Medicine

## 2014-04-04 ENCOUNTER — Ambulatory Visit (INDEPENDENT_AMBULATORY_CARE_PROVIDER_SITE_OTHER): Payer: BC Managed Care – PPO | Admitting: Family Medicine

## 2014-04-04 VITALS — BP 134/78 | HR 50 | Temp 98.7°F | Wt 233.6 lb

## 2014-04-04 DIAGNOSIS — M79601 Pain in right arm: Secondary | ICD-10-CM

## 2014-04-04 DIAGNOSIS — D473 Essential (hemorrhagic) thrombocythemia: Secondary | ICD-10-CM

## 2014-04-04 DIAGNOSIS — D75839 Thrombocytosis, unspecified: Secondary | ICD-10-CM

## 2014-04-04 NOTE — Telephone Encounter (Signed)
Lmom to call back. 

## 2014-04-04 NOTE — Patient Instructions (Signed)
We will schedule an ultrasound and call you with an appointment

## 2014-04-04 NOTE — Progress Notes (Signed)
Pre visit review using our clinic review tool, if applicable. No additional management support is needed unless otherwise documented below in the visit note. 

## 2014-04-04 NOTE — Progress Notes (Signed)
   Subjective:    Patient ID: [name], female    DOB: [birth year], 48 y.o.   MRN: [id number]  HPI Pt here c/o pain in R upper ext.  About 2 weeks ago she watched a "small nodule move down vein in arm to wrist and burst" she had a bruise for 3-4 days -- now she con't to have pain in upper R ext -- no swelling and not hot to touch.      Review of Systems As above   Objective:   Physical Exam BP 134/78 mmHg  Pulse 50  Temp(Src) 98.7 F (37.1 C) (Oral)  Wt 233 lb 9.6 oz (105.96 kg)  SpO2 99%  LMP 12/20/2010 General appearance: alert, cooperative, appears stated age and no distress Extremities: extremities normal, atraumatic, no cyanosis or edema and tenderness in upper R ext tricep area and axilla No edema, not warm to touch     Assessment & Plan:  1. Pain in anterior right upper extremity R/o dvt--- if pain returns or worsens-- to to ER Asa 81 x4 qd  Warm compresses - [place] Extrem Up Right Comp; Future

## 2014-04-05 ENCOUNTER — Telehealth: Payer: Self-pay | Admitting: Family Medicine

## 2014-04-05 ENCOUNTER — Ambulatory Visit (HOSPITAL_BASED_OUTPATIENT_CLINIC_OR_DEPARTMENT_OTHER)
Admission: RE | Admit: 2014-04-05 | Discharge: 2014-04-05 | Disposition: A | Discharge status: Home / Self Care | Payer: BC Managed Care – PPO | Source: Ambulatory Visit | Attending: Diagnostic Radiology | Admitting: Diagnostic Radiology

## 2014-04-05 DIAGNOSIS — M79601 Pain in right arm: Secondary | ICD-10-CM

## 2014-04-05 NOTE — Telephone Encounter (Signed)
Radiology called indicating that pt has started [place] for DVT assessment.  Pt complaining of SOB and CP.  Caller is not currently w/ pt and unable to relate if pt in distress.  Reviewed problem list and pt has hx of obesity, diastolic dysfxn.  Explained that with those symptoms, pt needs ER evaluation for her sxs.  Told caller to hang up, assess pt for distress and if in distress, will need ER immediately and if no obvious distress, can complete study and then send to ER for assessment.

## 2014-04-05 NOTE — Telephone Encounter (Signed)
LM for pt to RTC if no improvement.

## 2014-04-06 ENCOUNTER — Ambulatory Visit (INDEPENDENT_AMBULATORY_CARE_PROVIDER_SITE_OTHER): Payer: BC Managed Care – PPO | Admitting: Family Medicine

## 2014-04-06 ENCOUNTER — Encounter: Payer: Self-pay | Admitting: Family Medicine

## 2014-04-06 ENCOUNTER — Ambulatory Visit (HOSPITAL_BASED_OUTPATIENT_CLINIC_OR_DEPARTMENT_OTHER)
Admission: RE | Admit: 2014-04-06 | Discharge: 2014-04-06 | Disposition: A | Discharge status: Home / Self Care | Payer: BC Managed Care – PPO | Source: Ambulatory Visit | Attending: Family Medicine | Admitting: Family Medicine

## 2014-04-06 VITALS — BP 122/74 | HR 48 | Temp 97.8°F | Wt 233.0 lb

## 2014-04-06 DIAGNOSIS — R0602 Shortness of breath: Secondary | ICD-10-CM

## 2014-04-06 DIAGNOSIS — J029 Acute pharyngitis, unspecified: Secondary | ICD-10-CM

## 2014-04-06 DIAGNOSIS — R079 Chest pain, unspecified: Secondary | ICD-10-CM | POA: Insufficient documentation

## 2014-04-06 LAB — CBC WITH DIFFERENTIAL/PLATELET
BASOS PCT: 0.4 % (ref 0.0–3.0)
Basophils Absolute: 0 10*3/uL (ref 0.0–0.1)
EOS ABS: 0.1 10*3/uL (ref 0.0–0.7)
Eosinophils Relative: 1.5 % (ref 0.0–5.0)
HCT: 37.1 % (ref 36.0–46.0)
HEMOGLOBIN: 12.2 g/dL (ref 12.0–15.0)
Lymphocytes Relative: 41.4 % (ref 12.0–46.0)
Lymphs Abs: 3.3 10*3/uL (ref 0.7–4.0)
MCHC: 33 g/dL (ref 30.0–36.0)
MCV: 92.3 fl (ref 78.0–100.0)
Monocytes Absolute: 0.6 10*3/uL (ref 0.1–1.0)
Monocytes Relative: 7.1 % (ref 3.0–12.0)
NEUTROS ABS: 3.9 10*3/uL (ref 1.4–7.7)
Neutrophils Relative %: 49.6 % (ref 43.0–77.0)
Platelets: 503 10*3/uL — ABNORMAL HIGH (ref 150.0–400.0)
RBC: 4.02 Mil/uL (ref 3.87–5.11)
RDW: 14.1 % (ref 11.5–15.5)
WBC: 7.9 10*3/uL (ref 4.0–10.5)

## 2014-04-06 LAB — BASIC METABOLIC PANEL
BUN: 12 mg/dL (ref 6–23)
CALCIUM: 8.8 mg/dL (ref 8.4–10.5)
CO2: 27 mEq/L (ref 19–32)
Chloride: 101 mEq/L (ref 96–112)
Creatinine, Ser: 0.6 mg/dL (ref 0.4–1.2)
GFR: 139.37 mL/min (ref >60.00)
Glucose, Bld: 85 mg/dL (ref 70–99)
Potassium: 3.7 mEq/L (ref 3.5–5.1)
Sodium: 136 mEq/L (ref 135–145)

## 2014-04-06 LAB — HEPATIC FUNCTION PANEL
ALK PHOS: 113 U/L (ref 39–117)
ALT: 16 U/L (ref 0–35)
AST: 22 U/L (ref 0–37)
Albumin: 3.4 g/dL — ABNORMAL LOW (ref 3.5–5.2)
BILIRUBIN TOTAL: 0.3 mg/dL (ref 0.2–1.2)
Bilirubin, Direct: 0 mg/dL (ref 0.0–0.3)
Total Protein: 7.2 g/dL (ref 6.0–8.3)

## 2014-04-06 LAB — D-DIMER, QUANTITATIVE (NOT AT ARMC): D DIMER QUANT: 0.29 ug{FEU}/mL (ref 0.00–0.48)

## 2014-04-06 MED ORDER — CEFUROXIME AXETIL 500 MG PO TABS: 500.0000 mg | ORAL_TABLET | Freq: Two times a day (BID) | ORAL | Status: AC

## 2014-04-06 NOTE — Progress Notes (Signed)
Subjective:    [name] is a 48 y.o. female who presents for evaluation of chest pain. Onset was few months ago. Symptoms have been unchanged since that time. The patient describes the pain as dull, heaviness and radiates to the left neck/jaw and right neck/jaw. Patient rates pain as a 5/10 in intensity. Associated symptoms are: chest pain, chest pressure/discomfort and dyspnea. Aggravating factors are: none. Alleviating factors are: none. Patient's cardiac risk factors are: family history of premature cardiovascular disease, hypertension, obesity (BMI >= 30 kg/m2) and sedentary lifestyle. Patient's risk factors for DVT/PE: none. Previous cardiac testing: electrocardiogram (ECG).  Pt was in [place] yesterday and c/o to tech she was having chest pain.  Pt was advised to go to ER but she did not go.    Pt also c/o several days of sore throat and swollen glands--no fever  The following portions of the patient's history were reviewed and updated as appropriate:  She  has a past medical history of Hypertension; GERD (gastroesophageal reflux disease); IBS (irritable bowel syndrome); Diastolic dysfunction; Obstructive sleep apnea; Goiter; Diverticulosis of colon; Anemia, iron deficiency; Thrombocytosis; Morbid obesity; Hiatal hernia; Allergic rhinitis, cause unspecified; Undiagnosed cardiac murmurs; and COPD (chronic obstructive pulmonary disease). She  does not have any pertinent problems on file. She  has past surgical history that includes Lipoma excision; Endometrial ablation (12/08); Bariatric Surgery (06/17/2010); and Spine surgery (02/10/2012). Her family history includes Arrhythmia in her mother; Breast cancer in her mother; Cancer in her father; Cancer (age of onset: 63) in her maternal aunt; Cancer (age of onset: 55) in her mother; Colon polyps in her father; Heart disease in her mother; Hypertension in her father; Kidney cancer in her father; Lymphoma in her father. There is no history of Colon  cancer. She  reports that she has never smoked. She has never used smokeless tobacco. She reports that she does not drink alcohol or use illicit drugs. She has a current medication list which includes the following prescription(s): clindamycin-benzoyl peroxide, guaifenesin, hydrochlorothiazide, ipratropium, levothyroxine, loratadine-pseudoephedrine, losartan, mometasone, omeprazole, potassium chloride sa, and tretinoin microspheres. Current Outpatient Prescriptions on File Prior to Visit  Medication Sig Dispense Refill  . clindamycin-benzoyl peroxide (BENZACLIN) gel Apply topically 2 (two) times daily. 25 g 0  . Guaifenesin (MUCINEX MAXIMUM STRENGTH) 1200 MG TB12 Take 1 tablet (1,200 mg total) by mouth every 12 (twelve) hours as needed. 30 tablet 1  . hydrochlorothiazide (HYDRODIURIL) 25 MG tablet TAKE 1 TABLET (25 MG TOTAL) BY MOUTH DAILY. 30 tablet 4  . ipratropium (ATROVENT) 0.03 % nasal spray Place 2 sprays into both nostrils 2 (two) times daily. 30 mL 1  . levothyroxine (SYNTHROID, LEVOTHROID) 88 MCG tablet TAKE 1 TABLET (88 MCG TOTAL) BY MOUTH DAILY. 30 tablet 10  . loratadine-pseudoephedrine (CLARITIN-D 12 HOUR) 5-120 MG per tablet Take 1 tablet by mouth as needed.      Marland Kitchen losartan (COZAAR) 100 MG tablet Take 1 tablet (100 mg total) by mouth daily. 90 tablet 3  . mometasone (NASONEX) 50 MCG/ACT nasal spray Place 2 sprays into the nose daily as needed.      Marland Kitchen omeprazole (PRILOSEC) 20 MG capsule Take 20 mg by mouth daily.      . potassium chloride SA (KLOR-CON M20) 20 MEQ tablet TAKE 2 TABLET BY MOUTH EVERY DAY    . tretinoin microspheres (RETIN-A MICRO) 0.04 % gel Apply topically at bedtime. 45 g 0   No current facility-administered medications on file prior to visit.   She is allergic  to penicillins..  Review of Systems Pertinent items are noted in HPI.    Objective:    BP 122/74 mmHg  Pulse 48  Temp(Src) 97.8 F (36.6 C) (Oral)  Wt 233 lb (105.688 kg)  SpO2 99%  LMP  01/20/2011 General appearance: alert, cooperative, appears stated age and no distress Ears: normal TM's and external ear canals both ears Nose: Nares normal. Septum midline. Mucosa normal. No drainage or sinus tenderness. Throat: lips, mucosa, and tongue normal; teeth and gums normal--+ errythema, + adenopathy Neck: no adenopathy, no carotid bruit, no JVD, supple, symmetrical, trachea midline and thyroid not enlarged, symmetric, no tenderness/mass/nodules Lungs: clear to auscultation bilaterally Heart: S1, S2 normal Extremities: extremities normal, atraumatic, no cyanosis or edema  Cardiographics ECG: sinus brady  Imaging Chest x-ray: not available for review    Assessment:    Chest pain, suspected etiology: ?brady cardia   Plan:    Conservative measures indicated. Worsening signs and symptoms discussed and patient verbalized understanding. Cardiology consultation. echo and labs    1. Chest pain, unspecified chest pain type  - EKG 12-Lead - Ambulatory referral to Cardiology - 2D Echocardiogram without contrast; Future - D-Dimer, Quantitative - Basic metabolic panel - CBC with Differential - Hepatic function panel - DG Chest 2 View; Future  2. SOB (shortness of breath)  - Ambulatory referral to Cardiology - 2D Echocardiogram without contrast; Future - D-Dimer, Quantitative - Basic metabolic panel - CBC with Differential - Hepatic function panel - DG Chest 2 View; Future  3. Acute pharyngitis, unspecified pharyngitis type X few weeks - cefUROXime (CEFTIN) 500 MG tablet; Take 1 tablet (500 mg total) by mouth 2 (two) times daily.  Dispense: 20 tablet; Refill: 0

## 2014-04-06 NOTE — Patient Instructions (Signed)

## 2014-04-06 NOTE — Progress Notes (Signed)
Pre visit review using our clinic review tool, if applicable. No additional management support is needed unless otherwise documented below in the visit note. 

## 2014-04-07 ENCOUNTER — Encounter: Payer: Self-pay | Admitting: Cardiology

## 2014-04-07 ENCOUNTER — Ambulatory Visit (INDEPENDENT_AMBULATORY_CARE_PROVIDER_SITE_OTHER): Payer: BC Managed Care – PPO | Admitting: Cardiology

## 2014-04-07 VITALS — BP 124/70 | HR 65 | Ht 64.0 in | Wt 231.0 lb

## 2014-04-07 DIAGNOSIS — D473 Essential (hemorrhagic) thrombocythemia: Secondary | ICD-10-CM

## 2014-04-07 DIAGNOSIS — R0600 Dyspnea, unspecified: Secondary | ICD-10-CM

## 2014-04-07 DIAGNOSIS — D75839 Thrombocytosis, unspecified: Secondary | ICD-10-CM

## 2014-04-07 DIAGNOSIS — E669 Obesity, unspecified: Secondary | ICD-10-CM

## 2014-04-07 DIAGNOSIS — R079 Chest pain, unspecified: Secondary | ICD-10-CM

## 2014-04-07 NOTE — Progress Notes (Signed)
[place]. [address], [address] [place], [place]  [address] Phone: [phone] Fax:  [phone]  Date:  04/07/2014   ID:  [name], DOB May 26, 1966, MRN [id number]  PCP:  Garnet Koyanagi, DO   History of Present Illness: [name] is a 48 y.o. female here for evaluation of chest pain. A few months ago she had noted a heaviness in her chest radiating to her neck, jaw bilaterally moderate in intensity. Some tooth ache like pain, fleeting. She does have a family history of early coronary artery disease. EKG demonstrated sinus bradycardia rate 49 with no other abnormalities. She is currently not on any AV nodal blocking agents. [name] is daughter - works on [address]   Has high tolerance of pain. Felt right arm pain like ball going down arm. Thought she had a blood clot in right forearm. She messaged. Took ASA. Felt better. Husband wanted to go to ER.   Pulse normally in the 60's and 70's but 50's seems low. Trying to pull in a little air, felt mild dyspnea.   Wt Readings from Last 3 Encounters:  04/07/14 231 lb (104.781 kg)  04/06/14 233 lb (105.688 kg)  04/04/14 233 lb 9.6 oz (105.96 kg)     Past Medical History  Diagnosis Date  . Hypertension   . GERD (gastroesophageal reflux disease)   . IBS (irritable bowel syndrome)   . Diastolic dysfunction   . Obstructive sleep apnea   . Goiter   . Diverticulosis of colon   . Anemia, iron deficiency   . Thrombocytosis   . Morbid obesity   . Hiatal hernia   . Allergic rhinitis, cause unspecified   . Undiagnosed cardiac murmurs   . COPD (chronic obstructive pulmonary disease)     Past Surgical History  Procedure Laterality Date  . Lipoma excision      upper back  . Endometrial ablation  12/08  . Bariatric surgery  06/17/2010  . Spine surgery  02/10/2012    L4-l5 fusion--[facility]    Current Outpatient Prescriptions  Medication Sig Dispense Refill  . cefUROXime (CEFTIN) 500 MG tablet Take 1 tablet (500 mg total) by  mouth 2 (two) times daily. 20 tablet 0  . clindamycin-benzoyl peroxide (BENZACLIN) gel Apply topically 2 (two) times daily. 25 g 0  . Guaifenesin (MUCINEX MAXIMUM STRENGTH) 1200 MG TB12 Take 1 tablet (1,200 mg total) by mouth every 12 (twelve) hours as needed. 30 tablet 1  . hydrochlorothiazide (HYDRODIURIL) 25 MG tablet TAKE 1 TABLET (25 MG TOTAL) BY MOUTH DAILY. 30 tablet 4  . levothyroxine (SYNTHROID, LEVOTHROID) 88 MCG tablet TAKE 1 TABLET (88 MCG TOTAL) BY MOUTH DAILY. 30 tablet 10  . loratadine-pseudoephedrine (CLARITIN-D 12 HOUR) 5-120 MG per tablet Take 1 tablet by mouth as needed.      Marland Kitchen losartan (COZAAR) 100 MG tablet Take 1 tablet (100 mg total) by mouth daily. 90 tablet 3  . mometasone (NASONEX) 50 MCG/ACT nasal spray Place 2 sprays into the nose daily as needed.      Marland Kitchen omeprazole (PRILOSEC) 20 MG capsule Take 20 mg by mouth daily.      . potassium chloride SA (KLOR-CON M20) 20 MEQ tablet TAKE 2 TABLET BY MOUTH EVERY DAY    . tretinoin microspheres (RETIN-A MICRO) 0.04 % gel Apply topically at bedtime. 45 g 0  . ipratropium (ATROVENT) 0.03 % nasal spray Place 2 sprays into both nostrils 2 (two) times daily. 30 mL 1  No current facility-administered medications for this visit.    Allergies:    Allergies  Allergen Reactions  . Penicillins     Causes severe, difficult to treat yeast infections    Social History:  The patient  reports that she has never smoked. She has never used smokeless tobacco. She reports that she does not drink alcohol or use illicit drugs.   Family History  Problem Relation Age of Onset  . Breast cancer Mother   . Arrhythmia Mother   . Heart disease Mother   . Cancer Mother 50    breast  . Hypertension Father   . Kidney cancer Father   . Colon polyps Father   . Lymphoma Father     Diffused Large B-cell Lymphoma  . Cancer Father     lymphoma  . Colon cancer Neg Hx   . Cancer Maternal Aunt 39    breast cancer   Mother's father died 57 MI.    ROS:  Please see the history of present illness.   Has thrombocytosis, chronic. Has seen hematology in the past. 500 currently. Denies any syncope, bleeding. She is menopausal. No strokelike symptoms.  All other systems reviewed and negative.   PHYSICAL EXAM: VS:  BP 124/70 mmHg  Pulse 65  Ht 5\' 4"  (1.626 m)  Wt 231 lb (104.781 kg)  BMI 39.63 kg/m2  LMP 01/20/2011 Well nourished, well developed, in no acute distress HEENT: normal, [place]/AT, EOMI Neck: no JVD, normal carotid upstroke, no bruit Cardiac:  normal S1, S2; RRR;  murmur Lungs:  clear to auscultation bilaterally, no wheezing, rhonchi or rales Abd: soft, nontender, no hepatomegaly, no bruitsoverweight Ext: no edema, 2+ distal pulses, No evidence of upper extremity edema, normal distal pulses. No embolic phenomenon identified. Skin: warm and dry GU: deferred Neuro: no focal abnormalities noted, AAO x 3  EKG:  04/06/14- As above, sinus bradycardia with no other abnormalities. Labs: Platelets 500, LDL 116, hemoglobin 12.2, creatinine 0.6  ASSESSMENT AND PLAN:  1. Atypical chest pain-certainly could be muscular skeletal, possible GERD. Nonetheless, I will check exercise stress test. This will help also demonstrate chronotropic competence. 2. Shortness of breath-checking echocardiogram. I will to ensure proper structure and function. 3. Obesity-continue to encourage weight loss. Low carbohydrates. 4. Thrombocytosis-previously evaluated by hematology. Continuing to monitor. D-dimer was normal. She describes history of what she thinks was a blood clot traveling down her right arm which she could visualize in the vein in her wrist pulsating, massaged, went away. She has no evidence of embolic phenomenon, no pain in her digits. Pulses appear normal. 5. We will follow up with testing.  Signed, Candee Furbish, MD [facility]  04/07/2014 11:50 AM

## 2014-04-07 NOTE — Patient Instructions (Signed)
The current medical regimen is effective;  continue present plan and medications.  Your physician has requested that you have an exercise tolerance test. For further information please visit [email or web address]. Please also follow instruction sheet, as given.  Further follow up will be based on these results 

## 2014-04-08 ENCOUNTER — Other Ambulatory Visit: Payer: Self-pay | Admitting: Family Medicine

## 2014-04-08 DIAGNOSIS — R7989 Other specified abnormal findings of blood chemistry: Secondary | ICD-10-CM

## 2014-04-12 ENCOUNTER — Ambulatory Visit (HOSPITAL_BASED_OUTPATIENT_CLINIC_OR_DEPARTMENT_OTHER): Payer: BC Managed Care – PPO

## 2014-04-13 ENCOUNTER — Ambulatory Visit (HOSPITAL_COMMUNITY)
Admission: RE | Admit: 2014-04-13 | Discharge: 2014-04-13 | Disposition: A | Discharge status: Home / Self Care | Payer: BC Managed Care – PPO | Source: Ambulatory Visit | Attending: Family Medicine | Admitting: Family Medicine

## 2014-04-13 DIAGNOSIS — R011 Cardiac murmur, unspecified: Secondary | ICD-10-CM | POA: Insufficient documentation

## 2014-04-13 DIAGNOSIS — J449 Chronic obstructive pulmonary disease, unspecified: Secondary | ICD-10-CM | POA: Insufficient documentation

## 2014-04-13 DIAGNOSIS — R079 Chest pain, unspecified: Secondary | ICD-10-CM | POA: Diagnosis not present

## 2014-04-13 DIAGNOSIS — I10 Essential (primary) hypertension: Secondary | ICD-10-CM | POA: Insufficient documentation

## 2014-04-13 DIAGNOSIS — R0600 Dyspnea, unspecified: Secondary | ICD-10-CM | POA: Insufficient documentation

## 2014-04-13 DIAGNOSIS — I369 Nonrheumatic tricuspid valve disorder, unspecified: Secondary | ICD-10-CM

## 2014-04-13 DIAGNOSIS — R0602 Shortness of breath: Secondary | ICD-10-CM

## 2014-04-13 NOTE — Progress Notes (Signed)
Echo Lab  2D Echocardiogram completed.  [place], RDCS 04/13/2014 11:05 AM

## 2014-04-18 ENCOUNTER — Telehealth: Payer: Self-pay | Admitting: Hematology & Oncology

## 2014-04-18 NOTE — Telephone Encounter (Signed)
I spoke w NEW PATIENT today to remind them of their appointment with Dr. Ennever. Also, advised them to bring all medication bottles and insurance card information. ° °

## 2014-04-19 ENCOUNTER — Ambulatory Visit: Payer: BC Managed Care – PPO

## 2014-04-19 ENCOUNTER — Ambulatory Visit (HOSPITAL_BASED_OUTPATIENT_CLINIC_OR_DEPARTMENT_OTHER): Payer: BC Managed Care – PPO | Admitting: Lab

## 2014-04-19 ENCOUNTER — Encounter: Payer: Self-pay | Admitting: Family

## 2014-04-19 ENCOUNTER — Ambulatory Visit (HOSPITAL_BASED_OUTPATIENT_CLINIC_OR_DEPARTMENT_OTHER): Payer: BC Managed Care – PPO | Admitting: Family

## 2014-04-19 VITALS — BP 117/74 | HR 51 | Temp 97.9°F | Resp 14 | Ht 64.0 in | Wt 234.0 lb

## 2014-04-19 DIAGNOSIS — D72829 Elevated white blood cell count, unspecified: Secondary | ICD-10-CM

## 2014-04-19 DIAGNOSIS — D759 Disease of blood and blood-forming organs, unspecified: Secondary | ICD-10-CM

## 2014-04-19 LAB — CBC WITH DIFFERENTIAL (CANCER CENTER ONLY)
BASO#: 0 10*3/uL (ref 0.0–0.2)
BASO%: 0.3 % (ref 0.0–2.0)
EOS%: 2.8 % (ref 0.0–7.0)
Eosinophils Absolute: 0.2 10*3/uL (ref 0.0–0.5)
HCT: 39 % (ref 34.8–46.6)
HGB: 12.9 g/dL (ref 11.6–15.9)
LYMPH#: 3 10*3/uL (ref 0.9–3.3)
LYMPH%: 49.4 % — AB (ref 14.0–48.0)
MCH: 29.5 pg (ref 26.0–34.0)
MCHC: 33.1 g/dL (ref 32.0–36.0)
MCV: 89 fL (ref 81–101)
MONO#: 0.4 10*3/uL (ref 0.1–0.9)
MONO%: 6.9 % (ref 0.0–13.0)
NEUT#: 2.5 10*3/uL (ref 1.5–6.5)
NEUT%: 40.6 % (ref 39.6–80.0)
Platelets: 449 10*3/uL — ABNORMAL HIGH (ref 145–400)
RBC: 4.37 10*6/uL (ref 3.70–5.32)
RDW: 13.6 % (ref 11.1–15.7)
WBC: 6.1 10*3/uL (ref 3.9–10.0)

## 2014-04-19 LAB — RETICULOCYTES (CHCC)
ABS Retic: 79.2 10*3/uL (ref 19.0–186.0)
RBC.: 4.4 MIL/uL (ref 3.87–5.11)
Retic Ct Pct: 1.8 % (ref 0.4–2.3)

## 2014-04-19 LAB — CHCC SATELLITE - SMEAR

## 2014-04-19 LAB — LACTATE DEHYDROGENASE: LDH: 187 U/L (ref 94–250)

## 2014-04-19 NOTE — Progress Notes (Signed)
Hematology/Oncology Consultation   Name: [name]      MRN: [id number]    Location: Room/bed info not found  Date: 04/19/2014 Time:11:35 AM   REFERRING PHYSICIAN:  Rosalita Chessman  REASON FOR CONSULT:  Elevated platelet count    DIAGNOSIS:  Thrombocytosis  HISTORY OF PRESENT ILLNESS:  [name] is a very pleasant 48 yo African American female. She has had an elevated platelet count "on and off for years." She states that it has been in the 600's at times. Her platelets today are 449.  She states that she had what she believes was a blood clot travel down her right forearm and burt at her wrist leaving behind a large bruise. She had an ultrasound of that arm and it was negative for blood clots. She was also having some SOB at that time. Her chest xray was negative.  She had gastric bypass 3 years ago and has done well since. 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 She is able to walk now and no longer has to use a wheelchair.  She has hypertension and recently had to have her medication decreased because of hypotension and bradycardia. She is feeling better now.  She has hypothyroidism and is on synthroid. She is planning on having a hysterectomy in the new year. She has a history of uterine fibroids. She had an endometrial ablation in 2009.   She has 3 healthy adult children. She had 1 miscarriage.  She is on 2 baby asprin a day.  She denies fever, chills, n/v, cough, rash, headache, dizziness, SOB, chest pain, palpitations, abdominal pain, constipation, diarrhea, blood in urine or stool. 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 This was caused by nerve damage from the tumor she had on her spine.  She has no other numbness, tingling, tenderness or swelling in her extremities.  Her appetite is good and she is staying hydrated.  She stays at home and babysits her grandchildren. 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     ROS: All other 10 point review of systems is negative.   PAST MEDICAL HISTORY:   Past Medical History  Diagnosis Date  . Hypertension   . GERD (gastroesophageal reflux disease)   . IBS (irritable bowel syndrome)   . Diastolic dysfunction   . Obstructive sleep apnea   . Goiter   . Diverticulosis of colon   . Anemia, iron deficiency   . Thrombocytosis   . Morbid obesity   . Hiatal hernia   . Allergic rhinitis, cause unspecified   . Undiagnosed cardiac murmurs   . COPD (chronic obstructive pulmonary disease)     ALLERGIES: Allergies  Allergen Reactions  . Penicillins     Causes severe, difficult to treat yeast infections      MEDICATIONS:  Current Outpatient Prescriptions on File Prior to Visit  Medication Sig Dispense Refill  . clindamycin-benzoyl peroxide (BENZACLIN) gel Apply topically 2 (two) times daily. (Patient taking differently: Apply topically as needed. ) 25 g 0  . Guaifenesin (MUCINEX MAXIMUM STRENGTH) 1200 MG TB12 Take 1 tablet (1,200 mg total) by mouth every 12 (twelve) hours as needed. 30 tablet 1  . hydrochlorothiazide (HYDRODIURIL) 25 MG tablet TAKE 1 TABLET (25 MG TOTAL) BY MOUTH DAILY. 30 tablet 4  . ipratropium (ATROVENT) 0.03 % nasal spray Place 2 sprays into both nostrils 2 (two) times daily. [place]  mL 1  . levothyroxine (SYNTHROID, LEVOTHROID) 88 MCG tablet TAKE 1 TABLET (88 MCG TOTAL) BY MOUTH DAILY. 30 tablet 10  . loratadine-pseudoephedrine (CLARITIN-D 12 HOUR) 5-120 MG per tablet Take 1 tablet by mouth as needed.      Marland Kitchen losartan (COZAAR) 100 MG tablet Take 1 tablet (100 mg total) by mouth daily. 90 tablet 3  . mometasone (NASONEX) 50 MCG/ACT nasal spray Place 2 sprays into the nose daily as needed.      . potassium chloride SA (KLOR-CON M20) 20 MEQ tablet TAKE 2 TABLET BY MOUTH EVERY DAY    . tretinoin microspheres (RETIN-A MICRO) 0.04 % gel Apply topically at bedtime. (Patient taking differently: Apply topically as needed. ) 45 g 0   . omeprazole (PRILOSEC) 20 MG capsule Take 20 mg by mouth daily.       No current facility-administered medications on file prior to visit.     PAST SURGICAL HISTORY Past Surgical History  Procedure Laterality Date  . Lipoma excision      upper back  . Endometrial ablation  12/08  . Bariatric surgery  06/17/2010  . Spine surgery  02/10/2012    L4-l5 fusion--[facility]    FAMILY HISTORY: Family History  Problem Relation Age of Onset  . Breast cancer Mother   . Arrhythmia Mother   . Heart disease Mother   . Cancer Mother 68    breast  . Hypertension Father   . Kidney cancer Father   . Colon polyps Father   . Lymphoma Father     Diffused Large B-cell Lymphoma  . Cancer Father     lymphoma  . Colon cancer Neg Hx   . Cancer Maternal Aunt 58    breast cancer    SOCIAL HISTORY:  reports that she has never smoked. She has never used smokeless tobacco. She reports that she does not drink alcohol or use illicit drugs.  PERFORMANCE STATUS: The patient's performance status is 0 - Asymptomatic  PHYSICAL EXAM: Most Recent Vital Signs: Blood pressure 117/74, pulse 51, temperature 97.9 F (36.6 C), temperature source Oral, resp. rate 14, height 5\' 4"  (1.626 m), weight 234 lb (106.142 kg), last menstrual period 01/20/2011. BP 117/74 mmHg  Pulse 51  Temp(Src) 97.9 F (36.6 C) (Oral)  Resp 14  Ht 5\' 4"  (1.626 m)  Wt 234 lb (106.142 kg)  BMI 40.15 kg/m2  LMP 01/20/2011  General Appearance:    Alert, cooperative, no distress, appears stated age  Head:    Normocephalic, without obvious abnormality, atraumatic  Eyes:    PERRL, conjunctiva/corneas clear, EOM's intact, fundi    benign, both eyes        Throat:   Lips, mucosa, and tongue normal; teeth and gums normal  Neck:   Supple, symmetrical, trachea midline, no adenopathy;    thyroid:  no enlargement/tenderness/nodules; no carotid   bruit or JVD  Back:     Symmetric, no curvature, ROM normal, no CVA tenderness  Lungs:      Clear to auscultation bilaterally, respirations unlabored  Chest Wall:    No tenderness or deformity   Heart:    Regular rate and rhythm, S1 and S2 normal, no murmur, rub   or gallop     Abdomen:     Soft, non-tender, bowel sounds active all four quadrants,    no masses, no organomegaly        Extremities:   Extremities normal, atraumatic, no cyanosis or edema  Pulses:   2+ and symmetric  all extremities  Skin:   Skin color, texture, turgor normal, no rashes or lesions  Lymph nodes:   Cervical, supraclavicular, and axillary nodes normal  Neurologic:   CNII-XII intact, normal strength, sensation and reflexes    throughout    LABORATORY DATA:  Results for orders placed or performed in visit on 04/19/14 (from the past 48 hour(s))  CBC with [facility])     Status: Abnormal   Collection Time: 04/19/14 10:30 AM  Result Value Ref Range   WBC 6.1 3.9 - 10.0 10e3/uL   RBC 4.37 3.70 - 5.32 10e6/uL   HGB 12.9 11.6 - 15.9 g/dL   HCT 39.0 34.8 - 46.6 %   MCV 89 81 - 101 fL   MCH 29.5 26.0 - 34.0 pg   MCHC 33.1 32.0 - 36.0 g/dL   RDW 13.6 11.1 - 15.7 %   Platelets 449 (H) 145 - 400 10e3/uL   NEUT# 2.5 1.5 - 6.5 10e3/uL   LYMPH# 3.0 0.9 - 3.3 10e3/uL   MONO# 0.4 0.1 - 0.9 10e3/uL   Eosinophils Absolute 0.2 0.0 - 0.5 10e3/uL   BASO# 0.0 0.0 - 0.2 10e3/uL   NEUT% 40.6 39.6 - 80.0 %   LYMPH% 49.4 (H) 14.0 - 48.0 %   MONO% 6.9 0.0 - 13.0 %   EOS% 2.8 0.0 - 7.0 %   BASO% 0.3 0.0 - 2.0 %  [facility] - Smear     Status: None   Collection Time: 04/19/14 10:30 AM  Result Value Ref Range   Smear Result Smear Available       RADIOGRAPHY: No results found.     PATHOLOGY: None  ASSESSMENT/PLAN: Ms. Sweeten is a very pleasant 48 yo African American female. She has had an elevated platelet count "on and off for years." She states that it has been in the 600's at times. Her platelets today are 449. She also had gastric bypass 3 years ago.   We will see what her iron studies  show before scheduling her next follow-up appointment.  She will continue taking 2 baby asprin a day.  All questions were answered. She knows to call the clinic with any problems, questions or concerns. We can certainly see her much sooner if necessary.  The patient was discussed with and also seen by Dr. Marin Olp and he is in agreement with the aforementioned.   [facility]   Addendum:     I saw and examined the patient with Sarah. I looked at her blood smear. She has a normochromic and normocytic publisher red blood cells. There are no nucleated red blood cells. I see no target cells. She has no schistocytes. I see no teardrop cells. White cells are normal in morphology and maturation. There is no immature myeloid or lymphoid forms. Platelets are slightly increased in number. She has well granulated platelets.  It is certainly possible that she may have a myeloproliferative disorder. I don't think she is iron deficient. Her iron studies show ferritin of 48 with an iron saturation of 31%. She does not have a low MCV.  For now, we will see what the JAK2 shows. This may give [place] an idea. If she is positive for a JAK2 mutation, then she may probably will need a bone marrow test. I suppose that essential thrombocythemia is possible.  She is on aspirin.  We probably will get her back in another couple months, depending on what we find with her other blood studies.  We spent  about 45 minutes with her today. We answered all of her questions.

## 2014-04-21 LAB — IRON AND TIBC CHCC
%SAT: 31 % (ref 21–57)
IRON: 89 ug/dL (ref 41–142)
TIBC: 290 ug/dL (ref 236–444)
UIBC: 201 ug/dL (ref 120–384)

## 2014-04-21 LAB — FERRITIN CHCC: Ferritin: 48 ng/ml (ref 9–269)

## 2014-04-28 ENCOUNTER — Telehealth (HOSPITAL_COMMUNITY): Payer: Self-pay

## 2014-04-28 NOTE — Telephone Encounter (Signed)
Encounter complete. 

## 2014-05-02 ENCOUNTER — Telehealth (HOSPITAL_COMMUNITY): Payer: Self-pay

## 2014-05-02 NOTE — Telephone Encounter (Signed)
Encounter complete. 

## 2014-05-03 ENCOUNTER — Ambulatory Visit (HOSPITAL_COMMUNITY)
Admission: RE | Admit: 2014-05-03 | Discharge: 2014-05-03 | Disposition: A | Discharge status: Home / Self Care | Payer: 59 | Source: Ambulatory Visit | Attending: Cardiovascular Disease | Admitting: Cardiovascular Disease

## 2014-05-03 DIAGNOSIS — R079 Chest pain, unspecified: Secondary | ICD-10-CM | POA: Diagnosis not present

## 2014-05-03 NOTE — Procedures (Signed)
Exercise Treadmill Test  Pre-Exercise Testing Evaluation  NSR, nonspecific ST-T changes  Test  Exercise Tolerance Test Ordering MD: Candee Furbish, MD    Unique Test No: 1  Treadmill:  1  Indication for ETT: chest pain - rule out ischemia  Contraindication to ETT: No   Stress Modality: exercise - treadmill  Cardiac Imaging Performed: non   Protocol: standard Bruce - maximal  Max BP:  168/74  Max MPHR (bpm):  172 85% MPR (bpm):  146  MPHR obtained (bpm):  150 % MPHR obtained:  87  Reached 85% MPHR (min:sec):  5:10 Total Exercise Time (min-sec):  6  Workload in METS:  7.0 Borg Scale: 15  Reason ETT Terminated:  SOB and Fatigue    ST Segment Analysis At Rest: non-specific ST segment slurring With Exercise: no evidence of significant ST depression  Other Information Arrhythmia:  No Angina during ETT:  absent (0) Quality of ETT:  diagnostic  ETT Interpretation:  normal - no evidence of ischemia by ST analysis  Comments: Fair exercise tolerance. Normal BP response to exercise.  [name] Klein, MD, [facility] [phone] office [phone] pager

## 2014-05-09 ENCOUNTER — Telehealth: Payer: Self-pay | Admitting: Cardiology

## 2014-05-09 LAB — CALR MUTATAION(GENPATH)

## 2014-05-09 NOTE — Telephone Encounter (Signed)
Reviewed results of GXT with pt who states understanding. 

## 2014-05-09 NOTE — Telephone Encounter (Signed)
New message      Returning Pam's call

## 2014-05-25 ENCOUNTER — Other Ambulatory Visit: Payer: Self-pay | Admitting: Obstetrics and Gynecology

## 2014-05-26 DIAGNOSIS — T7840XA Allergy, unspecified, initial encounter: Secondary | ICD-10-CM

## 2014-05-26 HISTORY — DX: Allergy, unspecified, initial encounter: T78.40XA

## 2014-06-08 ENCOUNTER — Other Ambulatory Visit (HOSPITAL_COMMUNITY): Payer: Self-pay | Admitting: Obstetrics and Gynecology

## 2014-06-08 NOTE — H&P (Signed)
[name] is a 49 y.o.  female P 3-0-1-3 who  presents for hysterectomy because of irregular vaginal bleeding- post endometrial ablation. In 2008 the patient underwent endometrial ablation for menorrhagia and since that time had been amenorrheic until August 2015 when she had #2, 4 day episodes of vaginal bleeding with pad change twice a day.  Additionally she had cramping that she rates at 5/10 on a 10 point pain scale but was relieved with Ibuprofen 400 mg.  She denies any post-coital bleeding, dyspareunia, vaginitis symptoms, or changes in bowel or bladder function.  A sono-hysterogram in September showed: uterus-3.98 x 3.25 x 3.04 cm, endometrium-5.08 mm with a small cystic area in posterior endometrium measuring- 0.64 x 0.45 x 0.54 cm; also noted was a resolved left ovarian cyst with left ovary-2.51 x 2.26 x 1.52 cm.  and right ovary-2.35 x 1.15 x 1.11.  A review of both medical and surgical management options were given to the patient for management of her symptoms however, she desires definitive therapy in the form of hysterectomy.   Past Medical History  OB History: G: 4;  P 3-0-1-3;  SVB:  [place] with largest infant weighing 6 lbs. 13 ounces  GYN History: menarche: 49 YO    LMP: see HPI    Contracepton vasectomy  The patient denies history of sexually transmitted disease.  Denies history of abnormal PAP smear.  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 Penicillins Other (See Comments)    Causes severe, difficult to treat yeast infections   Denies sensitivity to peanuts, shellfish, soy, latex or adhesives.  ROS: Admits to contact lenses;  Denies headache, vision changes, nasal congestion, dysphagia, tinnitus, dizziness, hoarseness, cough,  chest pain, shortness of breath, nausea, vomiting, diarrhea,constipation,  urinary frequency, urgency  dysuria, hematuria, vaginitis symptoms, pelvic pain, swelling of joints,easy bruising,  myalgias, arthralgias, skin rashes, unexplained weight loss and except as is mentioned in the history of present illness, patient's review of systems is otherwise negative.   Physical Exam  Bp:  102/62  P: 60  R: 20 Temperature: 98.1 degrees F orally   Weight: 232 lbs. Height: 5\' 4"   BMI: 39.8  Neck: supple without masses or thyromegaly Lungs: clear to auscultation Heart: regular rate and rhythm Abdomen: soft, non-tender and no organomegaly Pelvic:EGBUS- wnl; vagina-normal rugae; uterus-normal size, cervix without lesions or motion tenderness; adnexae-no tenderness or masses Extremities:  no clubbing, cyanosis or edema  Endometrial Biopsy-patient refused procedure.  She is aware that this test would provide information regarding possible malignant or other abnormalities of the endometrium however, she is willing to accept the risks associated with not having this information prior to surgery.  Assesment: Irregular Vaginal Bleeding  S/P Endometrial Ablation   Disposition:  A discussion was held with patient regarding the indication for her procedure(s) along  with the risks, which include but are not limited to: reaction to anesthesia, damage to adjacent organs, infection,  excessive bleeding and possible need for an open abdominal incision.  The patient verbalized understanding of these risks and has consented to proceed with a Laparoscopically Assisted Vaginal Hysterectomy with Bilateral Salpingo-oophorectomy and Cystoscopy with Possible Total Abdominal Hysterectomy at [facility] on June 22, 2014 at 9:30 a.m.   CSN# [id number]   Emonii Wienke J. Florene Glen, PA-C  for Dr. Franklyn Lor. Dillard

## 2014-06-09 ENCOUNTER — Other Ambulatory Visit: Payer: Self-pay | Admitting: Obstetrics and Gynecology

## 2014-06-18 ENCOUNTER — Other Ambulatory Visit: Payer: Self-pay | Admitting: Family Medicine

## 2014-06-20 ENCOUNTER — Encounter (HOSPITAL_COMMUNITY): Payer: Self-pay

## 2014-06-20 ENCOUNTER — Encounter (HOSPITAL_COMMUNITY)
Admission: RE | Admit: 2014-06-20 | Discharge: 2014-06-20 | Disposition: A | Discharge status: Home / Self Care | Payer: 59 | Source: Ambulatory Visit | Attending: Obstetrics and Gynecology | Admitting: Obstetrics and Gynecology

## 2014-06-20 DIAGNOSIS — N939 Abnormal uterine and vaginal bleeding, unspecified: Secondary | ICD-10-CM | POA: Diagnosis present

## 2014-06-20 DIAGNOSIS — E039 Hypothyroidism, unspecified: Secondary | ICD-10-CM | POA: Diagnosis not present

## 2014-06-20 DIAGNOSIS — G4733 Obstructive sleep apnea (adult) (pediatric): Secondary | ICD-10-CM | POA: Diagnosis not present

## 2014-06-20 DIAGNOSIS — Z7902 Long term (current) use of antithrombotics/antiplatelets: Secondary | ICD-10-CM | POA: Diagnosis not present

## 2014-06-20 DIAGNOSIS — Z9889 Other specified postprocedural states: Secondary | ICD-10-CM | POA: Diagnosis not present

## 2014-06-20 DIAGNOSIS — Z9884 Bariatric surgery status: Secondary | ICD-10-CM | POA: Diagnosis not present

## 2014-06-20 DIAGNOSIS — Z6841 Body Mass Index (BMI) 40.0 and over, adult: Secondary | ICD-10-CM | POA: Diagnosis not present

## 2014-06-20 DIAGNOSIS — J449 Chronic obstructive pulmonary disease, unspecified: Secondary | ICD-10-CM | POA: Diagnosis not present

## 2014-06-20 DIAGNOSIS — N800 Endometriosis of uterus: Secondary | ICD-10-CM | POA: Diagnosis not present

## 2014-06-20 DIAGNOSIS — K219 Gastro-esophageal reflux disease without esophagitis: Secondary | ICD-10-CM | POA: Diagnosis not present

## 2014-06-20 DIAGNOSIS — I10 Essential (primary) hypertension: Secondary | ICD-10-CM | POA: Diagnosis not present

## 2014-06-20 DIAGNOSIS — D259 Leiomyoma of uterus, unspecified: Secondary | ICD-10-CM | POA: Diagnosis not present

## 2014-06-20 DIAGNOSIS — Z981 Arthrodesis status: Secondary | ICD-10-CM | POA: Diagnosis not present

## 2014-06-20 LAB — COMPREHENSIVE METABOLIC PANEL
ALT: 21 U/L (ref 0–35)
ANION GAP: 9 (ref 5–15)
AST: 25 U/L (ref 0–37)
Albumin: 4.2 g/dL (ref 3.5–5.2)
Alkaline Phosphatase: 135 U/L — ABNORMAL HIGH (ref 39–117)
BUN: 11 mg/dL (ref 6–23)
CALCIUM: 9.2 mg/dL (ref 8.4–10.5)
CHLORIDE: 105 mmol/L (ref 96–112)
CO2: 27 mmol/L (ref 19–32)
Creatinine, Ser: 0.67 mg/dL (ref 0.50–1.10)
GFR calc Af Amer: >90 mL/min (ref >90)
GFR calc non Af Amer: >90 mL/min (ref >90)
Glucose, Bld: 88 mg/dL (ref 70–99)
Potassium: 4 mmol/L (ref 3.5–5.1)
Sodium: 141 mmol/L (ref 135–145)
TOTAL PROTEIN: 7.6 g/dL (ref 6.0–8.3)
Total Bilirubin: 0.3 mg/dL (ref 0.3–1.2)

## 2014-06-20 LAB — CBC
HCT: 39 % (ref 36.0–46.0)
HEMOGLOBIN: 12.9 g/dL (ref 12.0–15.0)
MCH: 29.5 pg (ref 26.0–34.0)
MCHC: 33.1 g/dL (ref 30.0–36.0)
MCV: 89 fL (ref 78.0–100.0)
Platelets: 369 10*3/uL (ref 150–400)
RBC: 4.38 MIL/uL (ref 3.87–5.11)
RDW: 13.5 % (ref 11.5–15.5)
WBC: 8.1 10*3/uL (ref 4.0–10.5)

## 2014-06-20 NOTE — Patient Instructions (Addendum)
   Your procedure is scheduled on: JAN 28 AT [place] through the Main Entrance of [facility] at: [place] up the phone at the desk and dial [phone] and inform us of your arrival.  Please call this number if you have any problems the morning of surgery: [phone]  Remember: Do not eat food after midnight:JAN 27 Do not drink clear liquids after: JAN 27 Take these medicines the morning of surgery with a SIP OF WATER: TAKE BLOOD PRESSURE MEDS DAY OF SURGERY OR THE NIGHT BEFORE  Do not wear jewelry, make-up, or FINGER nail polish No metal in your hair or on your body. Do not wear lotions, powders, perfumes.  You may wear deodorant.  Do not bring valuables to the hospital. Contacts, dentures or bridgework may not be worn into surgery.  Leave suitcase in the car. After Surgery it may be brought to your room. For patients being admitted to the hospital, checkout time is 11:00am the day of discharge.    Patients discharged on the day of surgery will not be allowed to drive home.

## 2014-06-21 MED ORDER — DEXTROSE 5 % IV SOLN
INTRAVENOUS | Status: AC
  Administered 2014-06-22: 115 mL via INTRAVENOUS
  Filled 2014-06-21: qty 9.25

## 2014-06-22 ENCOUNTER — Ambulatory Visit (HOSPITAL_COMMUNITY): Payer: 59 | Admitting: Certified Registered Nurse Anesthetist

## 2014-06-22 ENCOUNTER — Encounter (HOSPITAL_COMMUNITY)
Admission: RE | Disposition: A | Discharge status: Home / Self Care | Payer: Self-pay | Source: Ambulatory Visit | Attending: Obstetrics and Gynecology

## 2014-06-22 ENCOUNTER — Observation Stay (HOSPITAL_COMMUNITY)
Admission: RE | Admit: 2014-06-22 | Discharge: 2014-06-23 | Disposition: A | Discharge status: Home / Self Care | Payer: 59 | Source: Ambulatory Visit | Attending: Obstetrics and Gynecology | Admitting: Obstetrics and Gynecology

## 2014-06-22 ENCOUNTER — Encounter (HOSPITAL_COMMUNITY): Payer: Self-pay | Admitting: Certified Registered Nurse Anesthetist

## 2014-06-22 DIAGNOSIS — Z6841 Body Mass Index (BMI) 40.0 and over, adult: Secondary | ICD-10-CM | POA: Insufficient documentation

## 2014-06-22 DIAGNOSIS — I10 Essential (primary) hypertension: Secondary | ICD-10-CM | POA: Insufficient documentation

## 2014-06-22 DIAGNOSIS — Z9884 Bariatric surgery status: Secondary | ICD-10-CM | POA: Insufficient documentation

## 2014-06-22 DIAGNOSIS — Z7902 Long term (current) use of antithrombotics/antiplatelets: Secondary | ICD-10-CM | POA: Insufficient documentation

## 2014-06-22 DIAGNOSIS — N926 Irregular menstruation, unspecified: Secondary | ICD-10-CM | POA: Diagnosis present

## 2014-06-22 DIAGNOSIS — D259 Leiomyoma of uterus, unspecified: Principal | ICD-10-CM | POA: Insufficient documentation

## 2014-06-22 DIAGNOSIS — N800 Endometriosis of uterus: Secondary | ICD-10-CM | POA: Insufficient documentation

## 2014-06-22 DIAGNOSIS — J449 Chronic obstructive pulmonary disease, unspecified: Secondary | ICD-10-CM | POA: Insufficient documentation

## 2014-06-22 DIAGNOSIS — Z981 Arthrodesis status: Secondary | ICD-10-CM | POA: Insufficient documentation

## 2014-06-22 DIAGNOSIS — Z9889 Other specified postprocedural states: Secondary | ICD-10-CM | POA: Insufficient documentation

## 2014-06-22 DIAGNOSIS — K219 Gastro-esophageal reflux disease without esophagitis: Secondary | ICD-10-CM | POA: Insufficient documentation

## 2014-06-22 DIAGNOSIS — E039 Hypothyroidism, unspecified: Secondary | ICD-10-CM | POA: Insufficient documentation

## 2014-06-22 DIAGNOSIS — G4733 Obstructive sleep apnea (adult) (pediatric): Secondary | ICD-10-CM | POA: Insufficient documentation

## 2014-06-22 HISTORY — PX: LAPAROSCOPIC ASSISTED VAGINAL HYSTERECTOMY: SHX5398

## 2014-06-22 HISTORY — PX: CYSTOSCOPY: SHX5120

## 2014-06-22 HISTORY — PX: BILATERAL SALPINGECTOMY: SHX5743

## 2014-06-22 LAB — PREGNANCY, URINE: Preg Test, Ur: NEGATIVE

## 2014-06-22 SURGERY — HYSTERECTOMY, VAGINAL, LAPAROSCOPY-ASSISTED
Anesthesia: General

## 2014-06-22 MED ORDER — OXYCODONE-ACETAMINOPHEN 5-325 MG PO TABS
1.0000 | ORAL_TABLET | ORAL | Status: DC | PRN
  Administered 2014-06-23: 1 via ORAL
  Filled 2014-06-22: qty 1

## 2014-06-22 MED ORDER — DEXAMETHASONE SODIUM PHOSPHATE 4 MG/ML IJ SOLN
INTRAMUSCULAR | Status: AC
  Filled 2014-06-22: qty 1

## 2014-06-22 MED ORDER — FENTANYL CITRATE 0.05 MG/ML IJ SOLN
INTRAMUSCULAR | Status: DC | PRN
  Administered 2014-06-22 (×2): 50 ug via INTRAVENOUS
  Administered 2014-06-22: 100 ug via INTRAVENOUS
  Administered 2014-06-22: 50 ug via INTRAVENOUS

## 2014-06-22 MED ORDER — ONDANSETRON HCL 4 MG/2ML IJ SOLN: 4.0000 mg | Freq: Four times a day (QID) | INTRAMUSCULAR | Status: DC | PRN

## 2014-06-22 MED ORDER — LIDOCAINE-EPINEPHRINE 0.5 %-1:200000 IJ SOLN
INTRAMUSCULAR | Status: AC
  Filled 2014-06-22: qty 1

## 2014-06-22 MED ORDER — SODIUM CHLORIDE 0.9 % IJ SOLN
INTRAMUSCULAR | Status: AC
  Filled 2014-06-22: qty 100

## 2014-06-22 MED ORDER — ACETAMINOPHEN 160 MG/5ML PO SOLN
ORAL | Status: AC
  Administered 2014-06-22: 960 mg via ORAL
  Filled 2014-06-22: qty 40.6

## 2014-06-22 MED ORDER — LACTATED RINGERS IR SOLN
Status: DC | PRN
  Administered 2014-06-22: 1200 mL

## 2014-06-22 MED ORDER — NALOXONE HCL 0.4 MG/ML IJ SOLN
0.4000 mg | INTRAMUSCULAR | Status: DC | PRN
Start: 2014-06-22 — End: 2014-06-23

## 2014-06-22 MED ORDER — LACTATED RINGERS IV SOLN
INTRAVENOUS | Status: DC
  Administered 2014-06-22 – 2014-06-23 (×2): via INTRAVENOUS

## 2014-06-22 MED ORDER — METOCLOPRAMIDE HCL 5 MG/ML IJ SOLN
10.0000 mg | Freq: Once | INTRAMUSCULAR | Status: AC
  Administered 2014-06-22: 10 mg via INTRAVENOUS

## 2014-06-22 MED ORDER — SODIUM CHLORIDE 0.9 % IJ SOLN: 9.0000 mL | INTRAMUSCULAR | Status: DC | PRN

## 2014-06-22 MED ORDER — HYDROMORPHONE 0.3 MG/ML IV SOLN
INTRAVENOUS | Status: DC
  Administered 2014-06-22: 3.59 mg via INTRAVENOUS
  Administered 2014-06-23: 0.4 mg via INTRAVENOUS
  Administered 2014-06-23: 0.2 mg via INTRAVENOUS
  Filled 2014-06-22: qty 25

## 2014-06-22 MED ORDER — LACTATED RINGERS IR SOLN
Status: DC | PRN
  Administered 2014-06-22: 3000 mL

## 2014-06-22 MED ORDER — PROPOFOL 10 MG/ML IV BOLUS
INTRAVENOUS | Status: DC | PRN
  Administered 2014-06-22: 180 mg via INTRAVENOUS

## 2014-06-22 MED ORDER — ONDANSETRON HCL 4 MG/2ML IJ SOLN
INTRAMUSCULAR | Status: DC | PRN
  Administered 2014-06-22: 4 mg via INTRAVENOUS

## 2014-06-22 MED ORDER — HYDROMORPHONE HCL 1 MG/ML IJ SOLN
INTRAMUSCULAR | Status: AC
  Filled 2014-06-22: qty 1

## 2014-06-22 MED ORDER — MIDAZOLAM HCL 2 MG/2ML IJ SOLN
INTRAMUSCULAR | Status: AC
Start: 2014-06-22 — End: 2014-06-22
  Filled 2014-06-22: qty 2

## 2014-06-22 MED ORDER — HYDROMORPHONE HCL 1 MG/ML IJ SOLN
INTRAMUSCULAR | Status: AC
  Administered 2014-06-22: 0.5 mg via INTRAVENOUS
  Filled 2014-06-22: qty 1

## 2014-06-22 MED ORDER — VASOPRESSIN 20 UNIT/ML IV SOLN
INTRAVENOUS | Status: AC
  Filled 2014-06-22: qty 1

## 2014-06-22 MED ORDER — ONDANSETRON HCL 4 MG/2ML IJ SOLN: 4.0000 mg | Freq: Once | INTRAMUSCULAR | Status: DC | PRN

## 2014-06-22 MED ORDER — METOCLOPRAMIDE HCL 5 MG/ML IJ SOLN
INTRAMUSCULAR | Status: AC
  Administered 2014-06-22: 10 mg via INTRAVENOUS
  Filled 2014-06-22: qty 2

## 2014-06-22 MED ORDER — GLYCOPYRROLATE 0.2 MG/ML IJ SOLN
INTRAMUSCULAR | Status: DC | PRN
  Administered 2014-06-22: 0.4 mg via INTRAVENOUS

## 2014-06-22 MED ORDER — HYDROCHLOROTHIAZIDE 25 MG PO TABS
25.0000 mg | ORAL_TABLET | Freq: Every day | ORAL | Status: DC
  Administered 2014-06-23: 25 mg via ORAL
  Filled 2014-06-22: qty 1

## 2014-06-22 MED ORDER — ONDANSETRON HCL 4 MG PO TABS: 4.0000 mg | ORAL_TABLET | Freq: Three times a day (TID) | ORAL | Status: DC | PRN

## 2014-06-22 MED ORDER — ONDANSETRON HCL 4 MG/2ML IJ SOLN
INTRAMUSCULAR | Status: AC
  Filled 2014-06-22: qty 2

## 2014-06-22 MED ORDER — LIDOCAINE HCL (CARDIAC) 20 MG/ML IV SOLN
INTRAVENOUS | Status: DC | PRN
  Administered 2014-06-22: 30 mg via INTRAVENOUS

## 2014-06-22 MED ORDER — GLYCOPYRROLATE 0.2 MG/ML IJ SOLN
INTRAMUSCULAR | Status: AC
  Filled 2014-06-22: qty 3

## 2014-06-22 MED ORDER — DIPHENHYDRAMINE HCL 12.5 MG/5ML PO ELIX: 12.5000 mg | ORAL_SOLUTION | Freq: Four times a day (QID) | ORAL | Status: DC | PRN

## 2014-06-22 MED ORDER — BUPIVACAINE HCL (PF) 0.25 % IJ SOLN
INTRAMUSCULAR | Status: AC
  Filled 2014-06-22: qty 30

## 2014-06-22 MED ORDER — SCOPOLAMINE 1 MG/3DAYS TD PT72
MEDICATED_PATCH | TRANSDERMAL | Status: AC
  Administered 2014-06-22: 1.5 mg via TRANSDERMAL
  Filled 2014-06-22: qty 1

## 2014-06-22 MED ORDER — KETOROLAC TROMETHAMINE 30 MG/ML IJ SOLN
30.0000 mg | Freq: Four times a day (QID) | INTRAMUSCULAR | Status: DC
  Administered 2014-06-22 – 2014-06-23 (×2): 30 mg via INTRAVENOUS
  Filled 2014-06-22 (×2): qty 1

## 2014-06-22 MED ORDER — LACTATED RINGERS IV SOLN
INTRAVENOUS | Status: DC
  Administered 2014-06-22 (×2): via INTRAVENOUS

## 2014-06-22 MED ORDER — HEPARIN SODIUM (PORCINE) 5000 UNIT/ML IJ SOLN
INTRAMUSCULAR | Status: AC
  Filled 2014-06-22: qty 1

## 2014-06-22 MED ORDER — HYDROMORPHONE HCL 1 MG/ML IJ SOLN
INTRAMUSCULAR | Status: DC | PRN
  Administered 2014-06-22 (×2): 0.5 mg via INTRAVENOUS

## 2014-06-22 MED ORDER — ACETAMINOPHEN 160 MG/5ML PO SOLN
960.0000 mg | Freq: Four times a day (QID) | ORAL | Status: DC | PRN
  Administered 2014-06-22: 960 mg via ORAL

## 2014-06-22 MED ORDER — PROPOFOL 10 MG/ML IV BOLUS
INTRAVENOUS | Status: AC
  Filled 2014-06-22: qty 20

## 2014-06-22 MED ORDER — ESTRADIOL 0.1 MG/GM VA CREA
TOPICAL_CREAM | VAGINAL | Status: AC
  Filled 2014-06-22: qty 42.5

## 2014-06-22 MED ORDER — HYDROMORPHONE HCL 1 MG/ML IJ SOLN
0.2500 mg | INTRAMUSCULAR | Status: DC | PRN
  Administered 2014-06-22 (×2): 0.5 mg via INTRAVENOUS

## 2014-06-22 MED ORDER — DIPHENHYDRAMINE HCL 50 MG/ML IJ SOLN: 12.5000 mg | Freq: Four times a day (QID) | INTRAMUSCULAR | Status: DC | PRN

## 2014-06-22 MED ORDER — LIDOCAINE HCL (PF) 1 % IJ SOLN
INTRAMUSCULAR | Status: AC
  Filled 2014-06-22: qty 5

## 2014-06-22 MED ORDER — NEOSTIGMINE METHYLSULFATE 10 MG/10ML IV SOLN
INTRAVENOUS | Status: DC | PRN
  Administered 2014-06-22: 3 mg via INTRAVENOUS

## 2014-06-22 MED ORDER — BUPIVACAINE HCL (PF) 0.25 % IJ SOLN
INTRAMUSCULAR | Status: DC | PRN
  Administered 2014-06-22: 18 mL

## 2014-06-22 MED ORDER — MENTHOL 3 MG MT LOZG
1.0000 | LOZENGE | OROMUCOSAL | Status: DC | PRN
  Administered 2014-06-23: 3 mg via ORAL
  Filled 2014-06-22 (×2): qty 9

## 2014-06-22 MED ORDER — SCOPOLAMINE 1 MG/3DAYS TD PT72
1.0000 | MEDICATED_PATCH | Freq: Once | TRANSDERMAL | Status: DC
  Administered 2014-06-22: 1.5 mg via TRANSDERMAL

## 2014-06-22 MED ORDER — EPHEDRINE SULFATE 50 MG/ML IJ SOLN
INTRAMUSCULAR | Status: DC | PRN
  Administered 2014-06-22: 5 mg via INTRAVENOUS
  Administered 2014-06-22: 10 mg via INTRAVENOUS
  Administered 2014-06-22 (×2): 5 mg via INTRAVENOUS

## 2014-06-22 MED ORDER — IBUPROFEN 600 MG PO TABS: 600.0000 mg | ORAL_TABLET | Freq: Four times a day (QID) | ORAL | Status: DC | PRN

## 2014-06-22 MED ORDER — LOSARTAN POTASSIUM 50 MG PO TABS
100.0000 mg | ORAL_TABLET | Freq: Every day | ORAL | Status: DC
  Administered 2014-06-23: 100 mg via ORAL
  Filled 2014-06-22 (×2): qty 2

## 2014-06-22 MED ORDER — DEXAMETHASONE SODIUM PHOSPHATE 10 MG/ML IJ SOLN
INTRAMUSCULAR | Status: DC | PRN
  Administered 2014-06-22: 4 mg via INTRAVENOUS

## 2014-06-22 MED ORDER — METHYLENE BLUE 1 % INJ SOLN
INTRAMUSCULAR | Status: DC | PRN
  Administered 2014-06-22: 10 mg via INTRAVENOUS

## 2014-06-22 MED ORDER — LEVOTHYROXINE SODIUM 88 MCG PO TABS
88.0000 ug | ORAL_TABLET | Freq: Every day | ORAL | Status: DC
  Administered 2014-06-23: 88 ug via ORAL
  Filled 2014-06-22 (×2): qty 1

## 2014-06-22 MED ORDER — ROCURONIUM BROMIDE 100 MG/10ML IV SOLN
INTRAVENOUS | Status: DC | PRN
  Administered 2014-06-22: 5 mg via INTRAVENOUS
  Administered 2014-06-22 (×2): 10 mg via INTRAVENOUS
  Administered 2014-06-22: 50 mg via INTRAVENOUS

## 2014-06-22 MED ORDER — FENTANYL CITRATE 0.05 MG/ML IJ SOLN
INTRAMUSCULAR | Status: AC
  Filled 2014-06-22: qty 5

## 2014-06-22 MED ORDER — NEOSTIGMINE METHYLSULFATE 10 MG/10ML IV SOLN
INTRAVENOUS | Status: AC
  Filled 2014-06-22: qty 1

## 2014-06-22 MED ORDER — LIDOCAINE-EPINEPHRINE (PF) 1 %-1:200000 IJ SOLN
INTRAMUSCULAR | Status: AC
  Filled 2014-06-22: qty 10

## 2014-06-22 MED ORDER — MIDAZOLAM HCL 2 MG/2ML IJ SOLN
INTRAMUSCULAR | Status: DC | PRN
  Administered 2014-06-22: 2 mg via INTRAVENOUS

## 2014-06-22 SURGICAL SUPPLY — 84 items
CABLE HIGH FREQUENCY MONO STRZ (ELECTRODE) IMPLANT
CANISTER SUCT 3000ML (MISCELLANEOUS) ×3 IMPLANT
CATH FOLEY 3WAY  5CC 16FR (CATHETERS)
CATH FOLEY 3WAY 5CC 16FR (CATHETERS) IMPLANT
CATH ROBINSON RED A/P 16FR (CATHETERS) IMPLANT
CLOTH BEACON ORANGE TIMEOUT ST (SAFETY) ×3 IMPLANT
CONT PATH 16OZ SNAP LID 3702 (MISCELLANEOUS) ×3 IMPLANT
COVER BACK TABLE 60X90IN (DRAPES) ×3 IMPLANT
COVER MAYO STAND STRL (DRAPES) IMPLANT
DECANTER SPIKE VIAL GLASS SM (MISCELLANEOUS) ×6 IMPLANT
DISSECTOR SPONGE CHERRY (GAUZE/BANDAGES/DRESSINGS) IMPLANT
DRAPE SHEET LG 3/4 BI-LAMINATE (DRAPES) ×6 IMPLANT
DRAPE WARM FLUID 44X44 (DRAPE) IMPLANT
DRSG COVADERM PLUS 2X2 (GAUZE/BANDAGES/DRESSINGS) ×6 IMPLANT
DRSG OPSITE POSTOP 3X4 (GAUZE/BANDAGES/DRESSINGS) ×6 IMPLANT
DRSG OPSITE POSTOP 4X10 (GAUZE/BANDAGES/DRESSINGS) ×3 IMPLANT
DURAPREP 26ML APPLICATOR (WOUND CARE) ×3 IMPLANT
ELECT REM PT RETURN 9FT ADLT (ELECTROSURGICAL) ×3
ELECTRODE REM PT RTRN 9FT ADLT (ELECTROSURGICAL) ×2 IMPLANT
EVACUATOR SMOKE 8.L (FILTER) ×6 IMPLANT
FORCEPS CUTTING 33CM 5MM (CUTTING FORCEPS) IMPLANT
GAUZE PACKING 2X5 YD STRL (GAUZE/BANDAGES/DRESSINGS) IMPLANT
GAUZE SPONGE 4X4 16PLY XRAY LF (GAUZE/BANDAGES/DRESSINGS) ×3 IMPLANT
GAUZE VASELINE 3X9 (GAUZE/BANDAGES/DRESSINGS) IMPLANT
GLOVE BIO SURGEON STRL SZ 6.5 (GLOVE) ×6 IMPLANT
GLOVE BIOGEL PI IND STRL 6.5 (GLOVE) ×4 IMPLANT
GLOVE BIOGEL PI IND STRL 7.0 (GLOVE) ×8 IMPLANT
GLOVE BIOGEL PI INDICATOR 6.5 (GLOVE) ×2
GLOVE BIOGEL PI INDICATOR 7.0 (GLOVE) ×4
GOWN STRL REUS W/TWL LRG LVL3 (GOWN DISPOSABLE) ×21 IMPLANT
LIQUID BAND (GAUZE/BANDAGES/DRESSINGS) IMPLANT
NEEDLE HYPO 25X1 1.5 SAFETY (NEEDLE) IMPLANT
NEEDLE MAYO .5 CIRCLE (NEEDLE) ×3 IMPLANT
NS IRRIG 1000ML POUR BTL (IV SOLUTION) ×3 IMPLANT
OCCLUDER COLPOPNEUMO (BALLOONS) IMPLANT
PACK LAVH (CUSTOM PROCEDURE TRAY) ×3 IMPLANT
PACK ROBOTIC GOWN (GOWN DISPOSABLE) ×3 IMPLANT
PAD OB MATERNITY 4.3X12.25 (PERSONAL CARE ITEMS) ×3 IMPLANT
PAD POSITIONER PINK NONSTERILE (MISCELLANEOUS) ×3 IMPLANT
PROTECTOR NERVE ULNAR (MISCELLANEOUS) ×3 IMPLANT
SCISSORS LAP 5X35 DISP (ENDOMECHANICALS) IMPLANT
SET CYSTO W/LG BORE CLAMP LF (SET/KITS/TRAYS/PACK) ×3 IMPLANT
SET IRRIG TUBING LAPAROSCOPIC (IRRIGATION / IRRIGATOR) ×3 IMPLANT
SHEARS HARMONIC ACE PLUS 36CM (ENDOMECHANICALS) IMPLANT
SLEEVE XCEL OPT CAN 5 100 (ENDOMECHANICALS) ×3 IMPLANT
SOLUTION ELECTROLUBE (MISCELLANEOUS) IMPLANT
SPONGE LAP 18X18 X RAY DECT (DISPOSABLE) ×6 IMPLANT
SPONGE SURGIFOAM ABS GEL 12-7 (HEMOSTASIS) ×3 IMPLANT
STAPLER VISISTAT 35W (STAPLE) IMPLANT
STRIP CLOSURE SKIN 1/2X4 (GAUZE/BANDAGES/DRESSINGS) ×3 IMPLANT
STRIP CLOSURE SKIN 1/4X3 (GAUZE/BANDAGES/DRESSINGS) IMPLANT
SUT CHROMIC 0 CT 1 (SUTURE) ×6 IMPLANT
SUT CHROMIC 0 UR 5 27 (SUTURE) ×6 IMPLANT
SUT MNCRL AB 3-0 PS2 27 (SUTURE) ×6 IMPLANT
SUT MON AB 3-0 SH 27 (SUTURE) ×1
SUT MON AB 3-0 SH27 (SUTURE) ×2 IMPLANT
SUT PDS AB 0 CT1 27 (SUTURE) IMPLANT
SUT PDS AB 1 CT1 36 (SUTURE) IMPLANT
SUT PLAIN 2 0 XLH (SUTURE) ×3 IMPLANT
SUT VIC AB 0 CT1 18XCR BRD8 (SUTURE) ×6 IMPLANT
SUT VIC AB 0 CT1 27 (SUTURE) ×2
SUT VIC AB 0 CT1 27XBRD ANBCTR (SUTURE) ×4 IMPLANT
SUT VIC AB 0 CT1 36 (SUTURE) ×3 IMPLANT
SUT VIC AB 0 CT1 8-18 (SUTURE) ×3
SUT VIC AB 2-0 SH 27 (SUTURE) ×1
SUT VIC AB 2-0 SH 27XBRD (SUTURE) ×2 IMPLANT
SUT VICRYL 0 ENDOLOOP (SUTURE) IMPLANT
SUT VICRYL 0 TIES 12 18 (SUTURE) ×3 IMPLANT
SUT VICRYL 0 UR6 27IN ABS (SUTURE) IMPLANT
SYR 50ML LL SCALE MARK (SYRINGE) IMPLANT
SYR BULB IRRIGATION 50ML (SYRINGE) ×3 IMPLANT
SYR CONTROL 10ML LL (SYRINGE) ×3 IMPLANT
SYR TB 1ML LUER SLIP (SYRINGE) ×3 IMPLANT
TIP UTERINE 5.1X6CM LAV DISP (MISCELLANEOUS) IMPLANT
TIP UTERINE 6.7X10CM GRN DISP (MISCELLANEOUS) IMPLANT
TIP UTERINE 6.7X6CM WHT DISP (MISCELLANEOUS) IMPLANT
TIP UTERINE 6.7X8CM BLUE DISP (MISCELLANEOUS) IMPLANT
TOWEL OR 17X24 6PK STRL BLUE (TOWEL DISPOSABLE) ×6 IMPLANT
TRAY FOLEY CATH 14FR (SET/KITS/TRAYS/PACK) ×3 IMPLANT
TROCAR BALLN 12MMX100 BLUNT (TROCAR) ×3 IMPLANT
TROCAR XCEL NON-BLD 11X100MML (ENDOMECHANICALS) IMPLANT
TROCAR XCEL NON-BLD 5MMX100MML (ENDOMECHANICALS) ×3 IMPLANT
TUBING FILTER THERMOFLATOR (ELECTROSURGICAL) IMPLANT
WATER STERILE IRR 1000ML POUR (IV SOLUTION) ×3 IMPLANT

## 2014-06-22 NOTE — Addendum Note (Signed)
Addendum  created 06/22/14 1710 by Flossie Dibble, CRNA   Modules edited: Notes Section   Notes Section:  File: [id number]

## 2014-06-22 NOTE — Anesthesia Preprocedure Evaluation (Addendum)
Anesthesia Evaluation  Patient identified by MRN, date of birth, ID band Patient awake    Reviewed: Allergy & Precautions, H&P , Patient's Chart, lab work & pertinent test results, reviewed documented beta blocker date and time   Airway Mallampati: II  TM Distance: >3 FB Neck ROM: full    Dental no notable dental hx.    Pulmonary sleep apnea , COPD COPD inhaler,  breath sounds clear to auscultation  Pulmonary exam normal       Cardiovascular hypertension, On Medications Rhythm:regular Rate:Normal     Neuro/Psych    GI/Hepatic Medicated,  Endo/Other  Hypothyroidism Morbid obesity  Renal/GU      Musculoskeletal   Abdominal   Peds  Hematology   Anesthesia Other Findings Hypertension; Good exercise tolerance.  GERD (gastroesophageal reflux disease)    Diastolic dysfunction; Good LV dysfxn   Obstructive sleep apnea Morbid obesity ; better after bariatric surgery  Hiatal hernia  COPD (chronic obstructive pulmonary disease)      Reproductive/Obstetrics                          Anesthesia Physical Anesthesia Plan  ASA: III  Anesthesia Plan: General   Post-op Pain Management:    Induction: Intravenous  Airway Management Planned: Oral ETT  Additional Equipment:   Intra-op Plan:   Post-operative Plan: Extubation in OR  Informed Consent: I have reviewed the patients History and Physical, chart, labs and discussed the procedure including the risks, benefits and alternatives for the proposed anesthesia with the patient or authorized representative who has indicated his/her understanding and acceptance.   Dental Advisory Given and Dental advisory given  Plan Discussed with: CRNA and Surgeon  Anesthesia Plan Comments: (  Discussed general anesthesia, including possible nausea, instrumentation of airway, sore throat,pulmonary aspiration, etc. I asked if the were any outstanding  questions, or  concerns before we proceeded. )        Anesthesia Quick Evaluation

## 2014-06-22 NOTE — Transfer of Care (Signed)
Immediate Anesthesia Transfer of Care Note  Patient: [name]  Procedure(s) Performed: Procedure(s): LAPAROSCOPIC ASSISTED VAGINAL HYSTERECTOMY (N/A) BILATERAL SALPINGECTOMY (Bilateral) CYSTOSCOPY (N/A)  Patient Location: PACU  Anesthesia Type:General  Level of Consciousness: awake and sedated  Airway & Oxygen Therapy: Patient Spontanous Breathing and Patient connected to nasal cannula oxygen  Post-op Assessment: Report given to PACU RN and Post -op Vital signs reviewed and stable  Post vital signs: Reviewed and stable  Last Vitals:  Filed Vitals:   06/22/14 0834  BP: 135/81  Pulse: 53  Temp: 36.6 C  Resp: 16    Complications: No apparent anesthesia complications

## 2014-06-22 NOTE — Op Note (Signed)
reop Diagnosis: Symptomatic Fibroids, 58570, poss. [id number]   Postop Diagnosis: Symptomatic Fibroids   Procedure: LAVH, B salpingectmy , Cystoscopy  Anesthesia: General   Anesthesiologist: Dr Royce Macadamia  Attending: Betsy Coder, MD   Assistant: Earnstine Regal PA  Findings:  small fibroid uterus.    Pathology: uterus and cervix and bilateral tubes  Fluids: 1200 cccrystalloid  UOP: 400cc  EBL: 016WF  Complications:none  Procedure: The patient was taken to the operating room, placed under general anesthesia and prepped and draped in the normal sterile fashion. A Foley catheter was placed in the bladde. A weighted speculum and vaginal retractors were placed in the vagina. Tenaculum was placed on the anterior lip of the cervix.  A hulka manipulator was placed in the uterus. Attention was then turned to the abdomen. A 10 mm infraumbilical incision was made with the scalpel after 5 cc of 25% percent Marcaine was used for local anesthesia. The subcutaneous tissue was dissected and the fascia was incised with the knife. There was a small fascial defect at the umbilicus which was sown with a figure of eight suture.   A purse string stitch was placed in the fascia and Hassan placed into the intra-abdominal cavity and anchored to the suture. Intraabdominal placement was confirmed with the laparoscope.  Two 5 mm trochars were placed in the right and left lower quadrants under direct visualization with the laparoscope.    Both round ligaments were cauterized and cut with the gyrus bipolar cautery as well and the bladder flap created with the tripolar and removed away from the uterus. Both fallopian tubes were cauterized and removed.   The left uterine ovarian ligament was then cauterized and cut.  The right fallopian tube was cauterized cut and removed.  The right utero-ovarian ligament was cauterized and cut with the tripolar cautery.  Attention was then turned to the vagina.  A weighted speculum was placed  in the posterior fourchette.  petrussin mixture was placed circumferentially around the cervix.  With blunt and sharp dissection the cervix was dissected away from the bowel and bladder.  Both uterosacral ligaments were clamped, cut and suture ligated and held.  The anterior and posterior culdesac was entered sharply using metzenbaum scissors.  The cardinal ligaments and  The uterine arteries were clamped, cut and suture ligated bilaterally.     The uterus was then  delivered.  The mcall suture was placed.   The vaginal cuff was closed with interrupted suture of 0 chromic.   the patient was given methylene blue.  Cystoscopy was performed and both ureters were seen to efflux urine without difficulty. Blue was later seen in the foley.   The bladder had full integrity with no suture or laceration visualized.  The vagina was inspected and the cuff was noted to be intact.  Attention was then turned back to the abdomen after removing top pair of gloves. The abdomen was reinsufflated with CO2 gas.   The abdomen and pelvis was copiously irrigated.  There was some bleeding from both adnexa which was made hemostatic with cautery.     hemostasis was noted.  interceed was placed along the cuff.    All trochars were removed under direct visualization using the laparoscope.  The umbilical fascia was reapproximated by tying the circumferential suture. The two 5 mm incisions were closed with 3-0 Monocryl via a subcuticular stitch.  All remaining skin incisions were closed with Dermabond and the 10 mm skin incisions were reinforced using Dermabond.  Sponge lap  and needle counts were correct.  The patient tolerated the procedure well and was returned to the PACU in stable condition

## 2014-06-22 NOTE — H&P (View-Only) (Signed)
[name] is a 49 y.o.  female P 3-0-1-3 who  presents for hysterectomy because of irregular vaginal bleeding- post endometrial ablation. In 2008 the patient underwent endometrial ablation for menorrhagia and since that time had been amenorrheic until August 2015 when she had #2, 4 day episodes of vaginal bleeding with pad change twice a day.  Additionally she had cramping that she rates at 5/10 on a 10 point pain scale but was relieved with Ibuprofen 400 mg.  She denies any post-coital bleeding, dyspareunia, vaginitis symptoms, or changes in bowel or bladder function.  A sono-hysterogram in September showed: uterus-3.98 x 3.25 x 3.04 cm, endometrium-5.08 mm with a small cystic area in posterior endometrium measuring- 0.64 x 0.45 x 0.54 cm; also noted was a resolved left ovarian cyst with left ovary-2.51 x 2.26 x 1.52 cm.  and right ovary-2.35 x 1.15 x 1.11.  A review of both medical and surgical management options were given to the patient for management of her symptoms however, she desires definitive therapy in the form of hysterectomy.   Past Medical History  OB History: G: 4;  P 3-0-1-3;  SVB:  [place] with largest infant weighing 6 lbs. 13 ounces  GYN History: menarche: 49 YO    LMP: see HPI    Contracepton vasectomy  The patient denies history of sexually transmitted disease.  Denies history of abnormal PAP smear.  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 Penicillins Other (See Comments)    Causes severe, difficult to treat yeast infections   Denies sensitivity to peanuts, shellfish, soy, latex or adhesives.  ROS: Admits to contact lenses;  Denies headache, vision changes, nasal congestion, dysphagia, tinnitus, dizziness, hoarseness, cough,  chest pain, shortness of breath, nausea, vomiting, diarrhea,constipation,  urinary frequency, urgency  dysuria, hematuria, vaginitis symptoms, pelvic pain, swelling of joints,easy bruising,  myalgias, arthralgias, skin rashes, unexplained weight loss and except as is mentioned in the history of present illness, patient's review of systems is otherwise negative.   Physical Exam  Bp:  102/62  P: 60  R: 20 Temperature: 98.1 degrees F orally   Weight: 232 lbs. Height: 5\' 4"   BMI: 39.8  Neck: supple without masses or thyromegaly Lungs: clear to auscultation Heart: regular rate and rhythm Abdomen: soft, non-tender and no organomegaly Pelvic:EGBUS- wnl; vagina-normal rugae; uterus-normal size, cervix without lesions or motion tenderness; adnexae-no tenderness or masses Extremities:  no clubbing, cyanosis or edema  Endometrial Biopsy-patient refused procedure.  She is aware that this test would provide information regarding possible malignant or other abnormalities of the endometrium however, she is willing to accept the risks associated with not having this information prior to surgery.  Assesment: Irregular Vaginal Bleeding  S/P Endometrial Ablation   Disposition:  A discussion was held with patient regarding the indication for her procedure(s) along  with the risks, which include but are not limited to: reaction to anesthesia, damage to adjacent organs, infection,  excessive bleeding and possible need for an open abdominal incision.  The patient verbalized understanding of these risks and has consented to proceed with a Laparoscopically Assisted Vaginal Hysterectomy with Bilateral Salpingo-oophorectomy and Cystoscopy with Possible Total Abdominal Hysterectomy at [facility] on June 22, 2014 at 9:30 a.m.   CSN# [id number]   Kerry-Anne Mezo J. Florene Glen, PA-C  for Dr. Franklyn Lor. Dillard

## 2014-06-22 NOTE — Progress Notes (Signed)
Day of Surgery Procedure(s) (LRB): LAPAROSCOPIC ASSISTED VAGINAL HYSTERECTOMY (N/A) BILATERAL SALPINGECTOMY (Bilateral) CYSTOSCOPY (N/A)  Subjective: Patient reports tolerating PO.    Objective: I have reviewed patient's vital signs, intake and output and medications.  General: alert and cooperative Resp: clear to auscultation bilaterally Cardio: regular rate and rhythm GI: soft, non-tender; bowel sounds normal; no masses,  no organomegaly Extremities: extremities normal, atraumatic, no cyanosis or edema Vaginal Bleeding: minimal  Assessment: s/p Procedure(s): LAPAROSCOPIC ASSISTED VAGINAL HYSTERECTOMY (N/A) BILATERAL SALPINGECTOMY (Bilateral) CYSTOSCOPY (N/A): stable, progressing well and tolerating diet  Plan: Advance diet Advance to PO medication  DC foley in am   LOS: 0 days    [name] A 06/22/2014, 10:46 PM

## 2014-06-22 NOTE — Anesthesia Postprocedure Evaluation (Signed)
Anesthesia Post Note  Patient: [name]  Procedure(s) Performed: Procedure(s): LAPAROSCOPIC ASSISTED VAGINAL HYSTERECTOMY (N/A) BILATERAL SALPINGECTOMY (Bilateral) CYSTOSCOPY (N/A)  Anesthesia type: General  Patient location: [facility]  Post pain: Pain level controlled  Post assessment: Post-op Vital signs reviewed  Last Vitals: BP 118/56 mmHg  Pulse 73  Temp(Src) 36.4 C (Oral)  Resp 16  Ht 5\' 3"  (1.6 m)  Wt 238 lb (107.956 kg)  BMI 42.17 kg/m2  SpO2 98%  LMP 12/20/2010  Post vital signs: Reviewed  Level of consciousness: awake  Complications: No apparent anesthesia complications

## 2014-06-22 NOTE — Anesthesia Procedure Notes (Signed)
Procedure Name: Intubation Date/Time: 06/22/2014 9:52 AM Performed by: Bufford Spikes Pre-anesthesia Checklist: Patient identified, Timeout performed, Emergency Drugs available, Suction available and Patient being monitored Patient Re-evaluated:Patient Re-evaluated prior to inductionOxygen Delivery Method: Circle system utilized Preoxygenation: Pre-oxygenation with 100% oxygen Intubation Type: IV induction Ventilation: Mask ventilation without difficulty and Oral airway inserted - appropriate to patient size Laryngoscope Size: Sabra Heck and 2 Grade View: Grade I Tube type: Oral Tube size: 7.0 mm Number of attempts: 1 Airway Equipment and Method: Stylet Placement Confirmation: ETT inserted through vocal cords under direct vision,  positive ETCO2 and breath sounds checked- equal and bilateral Secured at: 21 cm Tube secured with: Tape Dental Injury: Teeth and Oropharynx as per pre-operative assessment

## 2014-06-22 NOTE — Interval H&P Note (Signed)
History and Physical Interval Note:  06/22/2014 9:36 AM  [name]  has presented today for surgery, with the diagnosis of Dysmenorrhea, Fibroids  The various methods of treatment have been discussed with the patient and family. After consideration of risks, benefits and other options for treatment, the patient has consented to  Procedure(s): LAPAROSCOPIC ASSISTED VAGINAL HYSTERECTOMY (N/A) BILATERAL SALPINGECTOMY (Bilateral) CYSTOSCOPY (N/A) POSSIBLE OVARIAN CYSTECTOMY (N/A) POSSIBLE HYSTERECTOMY ABDOMINAL (N/A) as a surgical intervention .  The patient's history has been reviewed, patient examined, no change in status, stable for surgery.  I have reviewed the patient's chart and labs.  Questions were answered to the patient's satisfaction.     [facility] A

## 2014-06-22 NOTE — Anesthesia Postprocedure Evaluation (Signed)
  Anesthesia Post-op Note  Patient: [name]  Procedure(s) Performed: Procedure(s) (LRB): LAPAROSCOPIC ASSISTED VAGINAL HYSTERECTOMY (N/A) BILATERAL SALPINGECTOMY (Bilateral) CYSTOSCOPY (N/A)  Patient Location: PACU  Anesthesia Type: General  Level of Consciousness: awake and alert   Airway and Oxygen Therapy: Patient Spontanous Breathing  Post-op Pain: mild  Post-op Assessment: Post-op Vital signs reviewed, Patient's Cardiovascular Status Stable, Respiratory Function Stable, Patent Airway and No signs of Nausea or vomiting  Last Vitals:  Filed Vitals:   06/22/14 1330  BP:   Pulse: 58  Temp:   Resp: 18    Post-op Vital Signs: stable   Complications: No apparent anesthesia complications

## 2014-06-23 ENCOUNTER — Encounter (HOSPITAL_COMMUNITY): Payer: Self-pay | Admitting: Obstetrics and Gynecology

## 2014-06-23 DIAGNOSIS — D259 Leiomyoma of uterus, unspecified: Secondary | ICD-10-CM | POA: Diagnosis not present

## 2014-06-23 LAB — BASIC METABOLIC PANEL
ANION GAP: 7 (ref 5–15)
BUN: 9 mg/dL (ref 6–23)
CHLORIDE: 101 mmol/L (ref 96–112)
CO2: 30 mmol/L (ref 19–32)
CREATININE: 0.64 mg/dL (ref 0.50–1.10)
Calcium: 8.7 mg/dL (ref 8.4–10.5)
GLUCOSE: 105 mg/dL — AB (ref 70–99)
Potassium: 4.5 mmol/L (ref 3.5–5.1)
Sodium: 138 mmol/L (ref 135–145)

## 2014-06-23 LAB — CBC
HCT: 32.8 % — ABNORMAL LOW (ref 36.0–46.0)
HEMOGLOBIN: 11.2 g/dL — AB (ref 12.0–15.0)
MCH: 30.6 pg (ref 26.0–34.0)
MCHC: 34.1 g/dL (ref 30.0–36.0)
MCV: 89.6 fL (ref 78.0–100.0)
Platelets: 361 10*3/uL (ref 150–400)
RBC: 3.66 MIL/uL — ABNORMAL LOW (ref 3.87–5.11)
RDW: 13.3 % (ref 11.5–15.5)
WBC: 11 10*3/uL — ABNORMAL HIGH (ref 4.0–10.5)

## 2014-06-23 MED ORDER — OXYCODONE-ACETAMINOPHEN 5-325 MG PO TABS: 1.0000 | ORAL_TABLET | ORAL | Status: DC | PRN

## 2014-06-23 MED ORDER — ONDANSETRON HCL 4 MG PO TABS: 4.0000 mg | ORAL_TABLET | Freq: Three times a day (TID) | ORAL | Status: DC | PRN

## 2014-06-23 MED ORDER — IBUPROFEN 600 MG PO TABS: ORAL_TABLET | ORAL | Status: DC

## 2014-06-23 NOTE — Discharge Instructions (Signed)
Call [facility] @ [phone] if:  You have a temperature greater than or equal to 100.4 degrees Farenheit orally You have pain that is not made better by the pain medication given and taken as directed You have excessive bleeding or problems urinating  Take Colace (Docusate Sodium/Stool Softener) 100 mg 2-3 times daily while taking narcotic pain medicine to avoid constipation or until bowel movements are regular. Take Ibuprofen 600 mg with food every 6 hours for 5 days then as needed for pain  You may drive after 2 weeks You may walk up steps  You may shower  You may resume a regular diet  Keep incisions clean and dry Do not lift over 15 pounds for 6 weeks Avoid anything in vagina for 6 weeks (or until after your post-operative visit) Total Laparoscopic Hysterectomy A total laparoscopic hysterectomy is a minimally invasive surgery to remove your uterus and cervix. This surgery is performed by making several small cuts (incisions) in your abdomen. It can also be done with a thin, lighted tube (laparoscope) inserted into two small incisions in your lower abdomen. Your fallopian tubes and ovaries can be removed (bilateral salpingo-oophorectomy) during this surgery as well.Benefits of minimally invasive surgery include:  Less pain.  Less risk of blood loss.  Less risk of infection.  Quicker return to normal activities. LET [facility] CARE PROVIDER KNOW ABOUT:  Any allergies you have.  All medicines you are taking, including vitamins, herbs, eye drops, creams, and over-the-counter medicines.  Previous problems you or members of your family have had with the use of anesthetics.  Any blood disorders you have.  Previous surgeries you have had.  Medical conditions you have. RISKS AND COMPLICATIONS  Generally, this is a safe procedure. However, as with any procedure, complications can occur. Possible complications include:  Bleeding.  Blood clots in the legs or  lung.  Infection.  Injury to surrounding organs.  Problems with anesthesia.  Early menopause symptoms (hot flashes, night sweats, insomnia).  Risk of conversion to an open abdominal incision. BEFORE THE PROCEDURE  Ask your health care provider about changing or stopping your regular medicines.  Do not take aspirin or blood thinners (anticoagulants) for 1 week before the surgery or as told by your health care provider.  Do not eat or drink anything for 8 hours before the surgery or as told by your health care provider.  Quit smoking if you smoke.  Arrange for a ride home after surgery and for someone to help you at home during recovery. PROCEDURE   You will be given antibiotic medicine.  An IV tube will be placed in your arm. You will be given medicine to make you sleep (general anesthetic).  A gas (carbon dioxide) will be used to inflate your abdomen. This will allow your surgeon to look inside your abdomen, perform your surgery, and treat any other problems found if necessary.  Three or four small incisions (often less than 1/2 inch) will be made in your abdomen. One of these incisions will be made in the area of your belly button (navel). The laparoscope will be inserted into the incision. Your surgeon will look through the laparoscope while doing your procedure.  Other surgical instruments will be inserted through the other incisions.  Your uterus may be removed through your vagina or cut into small pieces and removed through the small incisions.  Your incisions will be closed. AFTER THE PROCEDURE  The gas will be released from inside your abdomen.  You  will be taken to the recovery area where a nurse will watch and check your progress. Once you are awake, stable, and taking fluids well, without other problems, you will return to your room or be allowed to go home.  There is usually minimal discomfort following the surgery because the incisions are so small.  You will  be given pain medicine while you are in the hospital and for when you go home. Document Released: 03/09/2007 Document Revised: 01/12/2013 Document Reviewed: 11/30/2012 [facility] Patient Information 2015 [place], [place]. This information is not intended to replace advice given to you by your health care provider. Make sure you discuss any questions you have with your health care provider.

## 2014-06-23 NOTE — Progress Notes (Signed)
Pt is discharged in the care of husband per ambulatory with [facility]. Discharge instructions with Rx were given to pt. Questions were asked and answered. NO equipment needed for home use. Stable.

## 2014-06-23 NOTE — Progress Notes (Signed)
[name] is a36 y.o.  [id number]  Post Op Date # 1:  LAVH/Cystoscopy  Subjective: Patient is Doing well postoperatively. Patient has The patient is not having any pain. Ambulated in halls without difficulty, is consuming a regular diet but hasn't voided since Foley removed.  Objective: Vital signs in last 24 hours: Temp:  [97.6 F (36.4 C)-98.6 F (37 C)] 98.1 F (36.7 C) (01/29 0554) Pulse Rate:  [53-82] 63 (01/29 0554) Resp:  [16-26] 16 (01/29 0554) BP: (112-135)/(45-81) 115/48 mmHg (01/29 0554) SpO2:  [96 %-100 %] 100 % (01/29 0554)  Intake/Output from previous day: 01/28 0701 - 01/29 0700 In: 4207 [I.V.:4207] Out: 2250 [Urine:2000] Intake/Output this shift:    Recent Labs Lab 06/20/14 0935 06/23/14 0550  WBC 8.1 11.0*  HGB 12.9 11.2*  HCT 39.0 32.8*  PLT 369 361     Recent Labs Lab 06/20/14 0935 06/23/14 0550  NA 141 138  K 4.0 4.5  CL 105 101  CO2 27 30  BUN 11 9  CREATININE 0.67 0.64  CALCIUM 9.2 8.7  PROT 7.6  --   BILITOT 0.3  --   ALKPHOS 135*  --   ALT 21  --   AST 25  --   GLUCOSE 88 105*    EXAM: General: alert, cooperative and no distress Resp: clear to auscultation bilaterally Cardio: regular rate and rhythm, S1, S2 normal, no murmur, click, rub or gallop GI: Bowel sounds present, soft, dressings clean/dry/intact though umbilical incision with dried blood on adhesive portion of bandage. Extremities: SCD hose in place and functioning;  no calf tenderness. Vaginal Bleeding: Scant amount on pad.   Assessment: s/p Procedure(s): LAPAROSCOPIC ASSISTED VAGINAL HYSTERECTOMY BILATERAL SALPINGECTOMY CYSTOSCOPY: progressing well  Plan: Awating patient to void  Discharge home later today  LOS: 1 day    Mattias Walmsley, PA-C 06/23/2014 7:16 AM

## 2014-06-23 NOTE — Discharge Summary (Signed)
Physician Discharge Summary  Patient ID: [name] MRN: [id number] DOB/AGE: 1965-08-07 49 y.o.  Admit date: 06/22/2014 Discharge date: 06/23/2014   Discharge Diagnoses: Irregular Bleeding;  S/P Endometrial Ablation Active Problems:   Irregular bleeding   Operation: Laparoscopically Assisted Vaginal Hysterectomy with Bilateral Salpingectomy and Cystoscopy   Discharged Condition: Good  Hospital Course: On the date of admission the patient underwent the aforementioned procedures and tolerated them well.  Post operative course was unremarkable with the patient resuming bowel and bladder function by post operative day #1 and was therefore deemed ready for discharge home.  Discharge hemoglobin was 11.2.  Disposition: Home to self  care  Discharge Medications:    Medication List    TAKE these medications        aspirin 81 MG tablet  Take 162 mg by mouth daily.     BLACK COHOSH PO  Take 1 capsule by mouth daily.     clindamycin-benzoyl peroxide gel  Commonly known as:  BENZACLIN  Apply topically 2 (two) times daily.     Guaifenesin 1200 MG Tb12  Commonly known as:  MUCINEX MAXIMUM STRENGTH  Take 1 tablet (1,200 mg total) by mouth every 12 (twelve) hours as needed.     hydrochlorothiazide 25 MG tablet  Commonly known as:  HYDRODIURIL  TAKE 1 TABLET (25 MG TOTAL) BY MOUTH DAILY.     ibuprofen 600 MG tablet  Commonly known as:  ADVIL,MOTRIN  1  po  pc  every 6 hours x 5 days then as needed for pain     levothyroxine 88 MCG tablet  Commonly known as:  SYNTHROID, LEVOTHROID  TAKE 1 TABLET (88 MCG TOTAL) BY MOUTH DAILY.     loratadine 10 MG tablet  Commonly known as:  CLARITIN  Take 10 mg by mouth daily.     losartan 100 MG tablet  Commonly known as:  COZAAR  Take 1 tablet (100 mg total) by mouth daily.     ondansetron 4 MG tablet  Commonly known as:  ZOFRAN  Take 1 tablet (4 mg total) by mouth every 8 (eight) hours as needed for nausea or vomiting.     oxyCODONE-acetaminophen 5-325 MG per tablet  Commonly known as:  PERCOCET/ROXICET  Take 1-2 tablets by mouth every 4 (four) hours as needed for severe pain (moderate to severe pain (when tolerating fluids)).     potassium chloride SA 20 MEQ tablet  Commonly known as:  K-DUR,KLOR-CON  Take 20 mEq by mouth daily.     tretinoin microspheres 0.04 % gel  Commonly known as:  RETIN-A MICRO  Apply topically at bedtime.     VITAMIN B-12 PO  Take 1 tablet by mouth daily.      ASK your doctor about these medications        ipratropium 0.03 % nasal spray  Commonly known as:  ATROVENT  Place 2 sprays into both nostrils 2 (two) times daily.          Follow-up: Dr. Gwynneth Munson A. Dillard on August 01, 2014 at 9:30 a.m.   SignedEarnstine Regal, PA-C 06/23/2014, 7:47 AM

## 2014-07-24 ENCOUNTER — Ambulatory Visit: Payer: BC Managed Care – PPO | Admitting: Family Medicine

## 2014-07-28 ENCOUNTER — Encounter: Payer: Self-pay | Admitting: Family Medicine

## 2014-07-28 ENCOUNTER — Ambulatory Visit (INDEPENDENT_AMBULATORY_CARE_PROVIDER_SITE_OTHER): Payer: 59 | Admitting: Family Medicine

## 2014-07-28 VITALS — BP 120/72 | HR 57 | Temp 97.9°F | Wt 238.6 lb

## 2014-07-28 DIAGNOSIS — E049 Nontoxic goiter, unspecified: Secondary | ICD-10-CM

## 2014-07-28 DIAGNOSIS — E876 Hypokalemia: Secondary | ICD-10-CM

## 2014-07-28 DIAGNOSIS — I10 Essential (primary) hypertension: Secondary | ICD-10-CM

## 2014-07-28 DIAGNOSIS — E039 Hypothyroidism, unspecified: Secondary | ICD-10-CM

## 2014-07-28 DIAGNOSIS — Z8639 Personal history of other endocrine, nutritional and metabolic disease: Secondary | ICD-10-CM

## 2014-07-28 DIAGNOSIS — R748 Abnormal levels of other serum enzymes: Secondary | ICD-10-CM

## 2014-07-28 DIAGNOSIS — R739 Hyperglycemia, unspecified: Secondary | ICD-10-CM

## 2014-07-28 LAB — BASIC METABOLIC PANEL
BUN: 13 mg/dL (ref 6–23)
CALCIUM: 9.4 mg/dL (ref 8.4–10.5)
CO2: 32 mEq/L (ref 19–32)
Chloride: 100 mEq/L (ref 96–112)
Creatinine, Ser: 0.64 mg/dL (ref 0.40–1.20)
GFR: 126.72 mL/min (ref >60.00)
Glucose, Bld: 90 mg/dL (ref 70–99)
POTASSIUM: 3.9 meq/L (ref 3.5–5.1)
SODIUM: 137 meq/L (ref 135–145)

## 2014-07-28 LAB — HEPATIC FUNCTION PANEL
ALK PHOS: 147 U/L — AB (ref 39–117)
ALT: 15 U/L (ref 0–35)
AST: 23 U/L (ref 0–37)
Albumin: 4.2 g/dL (ref 3.5–5.2)
BILIRUBIN TOTAL: 0.3 mg/dL (ref 0.2–1.2)
Bilirubin, Direct: 0.1 mg/dL (ref 0.0–0.3)
Total Protein: 7.5 g/dL (ref 6.0–8.3)

## 2014-07-28 LAB — LIPID PANEL
CHOLESTEROL: 179 mg/dL (ref 0–200)
HDL: 61.2 mg/dL (ref >39.00)
LDL Cholesterol: 100 mg/dL — ABNORMAL HIGH (ref 0–99)
NONHDL: 117.8
Total CHOL/HDL Ratio: 3
Triglycerides: 91 mg/dL (ref 0.0–149.0)
VLDL: 18.2 mg/dL (ref 0.0–40.0)

## 2014-07-28 LAB — HEMOGLOBIN A1C: Hgb A1c MFr Bld: 5.8 % (ref 4.6–6.5)

## 2014-07-28 LAB — VITAMIN D 25 HYDROXY (VIT D DEFICIENCY, FRACTURES): VITD: 19.61 ng/mL — AB (ref 30.00–100.00)

## 2014-07-28 MED ORDER — POTASSIUM CHLORIDE CRYS ER 20 MEQ PO TBCR: 20.0000 meq | EXTENDED_RELEASE_TABLET | Freq: Every day | ORAL | Status: DC

## 2014-07-28 NOTE — Patient Instructions (Signed)

## 2014-07-28 NOTE — Progress Notes (Signed)
Pre visit review using our clinic review tool, if applicable. No additional management support is needed unless otherwise documented below in the visit note. 

## 2014-07-29 LAB — THYROID PANEL WITH TSH
Free Thyroxine Index: 2.7 (ref 1.4–3.8)
T3 Uptake: 27 % (ref 22–35)
T4 TOTAL: 9.9 ug/dL (ref 4.5–12.0)
TSH: 1.318 u[IU]/mL (ref 0.350–4.500)

## 2014-07-30 NOTE — Assessment & Plan Note (Signed)
Check labs con't synthroid 

## 2014-07-30 NOTE — Assessment & Plan Note (Signed)
con't synthroid Check US neck Check labs

## 2014-07-30 NOTE — Progress Notes (Signed)
Subjective:    Patient ID: [name], female    DOB: 06/29/1965, 49 y.o.   MRN: [id number]  HPI  Patient here for f/u bp, thyroid,   Past Medical History  Diagnosis Date  . Hypertension   . GERD (gastroesophageal reflux disease)   . IBS (irritable bowel syndrome)   . Diastolic dysfunction   . Obstructive sleep apnea   . Goiter   . Diverticulosis of colon   . Anemia, iron deficiency   . Thrombocytosis   . Morbid obesity   . Hiatal hernia   . Allergic rhinitis, cause unspecified   . Undiagnosed cardiac murmurs   . COPD (chronic obstructive pulmonary disease)     Review of Systems  Constitutional: Negative for activity change, appetite change, fatigue and unexpected weight change.  Respiratory: Negative for cough and shortness of breath.   Cardiovascular: Negative for chest pain and palpitations.  Endocrine: Negative for cold intolerance, heat intolerance, polydipsia, polyphagia and polyuria.  Psychiatric/Behavioral: Negative for behavioral problems and dysphoric mood. The patient is not nervous/anxious and is not hyperactive.        Objective:    Physical Exam  Constitutional: She is oriented to person, place, and time. She appears well-developed and well-nourished. No distress.  HENT:  Right Ear: External ear normal.  Left Ear: External ear normal.  Nose: Nose normal.  Mouth/Throat: Oropharynx is clear and moist.  Eyes: EOM are normal. Pupils are equal, round, and reactive to light.  Neck: Normal range of motion. Neck supple. No JVD present. No tracheal deviation present. Thyromegaly present.  Cardiovascular: Normal rate, regular rhythm and normal heart sounds.   No murmur heard. Pulmonary/Chest: Effort normal and breath sounds normal. No stridor. No respiratory distress. She has no wheezes. She has no rales. She exhibits no tenderness.  Lymphadenopathy:    She has no cervical adenopathy.  Neurological: She is alert and oriented to person, place, and time.    Psychiatric: She has a normal mood and affect. Her behavior is normal. Judgment and thought content normal.    BP 120/72 mmHg  Pulse 57  Temp(Src) 97.9 F (36.6 C) (Oral)  Wt 238 lb 9.6 oz (108.228 kg)  SpO2 97%  LMP 01/20/2011 Wt Readings from Last 3 Encounters:  07/28/14 238 lb 9.6 oz (108.228 kg)  06/21/14 238 lb (107.956 kg)  04/19/14 234 lb (106.142 kg)     Lab Results  Component Value Date   WBC 11.0* 06/23/2014   HGB 11.2* 06/23/2014   HCT 32.8* 06/23/2014   PLT 361 06/23/2014   GLUCOSE 90 07/28/2014   CHOL 179 07/28/2014   TRIG 91.0 07/28/2014   HDL 61.20 07/28/2014   LDLCALC 100* 07/28/2014   ALT 15 07/28/2014   AST 23 07/28/2014   NA 137 07/28/2014   K 3.9 07/28/2014   CL 100 07/28/2014   CREATININE 0.64 07/28/2014   BUN 13 07/28/2014   CO2 32 07/28/2014   TSH 1.318 07/28/2014   HGBA1C 5.8 07/28/2014    No results found.     Assessment & Plan:   Problem List Items Addressed This Visit      Unprioritized   Hypothyroidism    Check labs con't synthroid      Relevant Orders   Thyroid Panel With TSH (Completed)   US Soft Tissue Head/Neck   Goiter    con't synthroid Check US neck Check labs      Essential hypertension   Relevant Orders   Hemoglobin A1c (Completed)  Lipid panel (Completed)    Other Visit Diagnoses    Elevated alkaline phosphatase level    -  Primary    Relevant Orders    Hepatic function panel (Completed)    Vitamin D (25 hydroxy) (Completed)    Hypokalemia        Relevant Medications    potassium chloride SA (K-DUR,KLOR-CON) CR tablet    Other Relevant Orders    Basic metabolic panel (Completed)    History of goiter        Relevant Orders    US Soft Tissue Head/Neck    Hyperglycemia        Relevant Orders    Hemoglobin A1c (Completed)    Lipid panel (Completed)        Garnet Koyanagi, DO

## 2014-07-31 ENCOUNTER — Telehealth: Payer: Self-pay | Admitting: Family Medicine

## 2014-07-31 ENCOUNTER — Other Ambulatory Visit: Payer: Self-pay

## 2014-07-31 MED ORDER — VITAMIN D (ERGOCALCIFEROL) 1.25 MG (50000 UNIT) PO CAPS: 50000.0000 [IU] | ORAL_CAPSULE | ORAL | Status: DC

## 2014-07-31 NOTE — Telephone Encounter (Signed)
Caller name:Marvis Evan  Relationship to patient:self Can be [phone] Pharmacy:[facility] on [address]  Reason for call:Request to speak to nurse about lab results

## 2014-07-31 NOTE — Telephone Encounter (Signed)
Discussed Vitamin D levels and the the Rx has been faxed.      KP

## 2014-08-01 ENCOUNTER — Ambulatory Visit (HOSPITAL_BASED_OUTPATIENT_CLINIC_OR_DEPARTMENT_OTHER): Payer: 59

## 2014-08-10 ENCOUNTER — Ambulatory Visit (HOSPITAL_BASED_OUTPATIENT_CLINIC_OR_DEPARTMENT_OTHER)
Admission: RE | Admit: 2014-08-10 | Discharge: 2014-08-10 | Disposition: A | Discharge status: Home / Self Care | Payer: 59 | Source: Ambulatory Visit | Attending: Family Medicine | Admitting: Family Medicine

## 2014-08-10 DIAGNOSIS — Z8639 Personal history of other endocrine, nutritional and metabolic disease: Secondary | ICD-10-CM

## 2014-08-10 DIAGNOSIS — E041 Nontoxic single thyroid nodule: Secondary | ICD-10-CM | POA: Diagnosis not present

## 2014-08-10 DIAGNOSIS — R221 Localized swelling, mass and lump, neck: Secondary | ICD-10-CM | POA: Diagnosis not present

## 2014-08-10 DIAGNOSIS — E039 Hypothyroidism, unspecified: Secondary | ICD-10-CM

## 2014-08-10 DIAGNOSIS — E010 Iodine-deficiency related diffuse (endemic) goiter: Secondary | ICD-10-CM | POA: Diagnosis present

## 2014-08-14 ENCOUNTER — Other Ambulatory Visit: Payer: Self-pay

## 2014-08-14 DIAGNOSIS — R221 Localized swelling, mass and lump, neck: Secondary | ICD-10-CM

## 2014-08-15 ENCOUNTER — Ambulatory Visit (HOSPITAL_BASED_OUTPATIENT_CLINIC_OR_DEPARTMENT_OTHER)
Admission: RE | Admit: 2014-08-15 | Discharge: 2014-08-15 | Disposition: A | Discharge status: Home / Self Care | Payer: 59 | Source: Ambulatory Visit | Attending: Family Medicine | Admitting: Family Medicine

## 2014-08-15 ENCOUNTER — Encounter (HOSPITAL_BASED_OUTPATIENT_CLINIC_OR_DEPARTMENT_OTHER): Payer: Self-pay

## 2014-08-15 DIAGNOSIS — R221 Localized swelling, mass and lump, neck: Secondary | ICD-10-CM | POA: Diagnosis present

## 2014-08-15 MED ORDER — IOHEXOL 300 MG/ML  SOLN
75.0000 mL | Freq: Once | INTRAMUSCULAR | Status: AC | PRN
Start: 2014-08-15 — End: 2014-08-15
  Administered 2014-08-15: 75 mL via INTRAVENOUS

## 2014-08-29 ENCOUNTER — Telehealth: Payer: Self-pay

## 2014-08-29 DIAGNOSIS — E079 Disorder of thyroid, unspecified: Secondary | ICD-10-CM

## 2014-08-29 NOTE — Telephone Encounter (Signed)
Can refer to ent to check mass

## 2014-08-29 NOTE — Telephone Encounter (Signed)
The mass in her neck is getting bigger and hurts when she moves her neck. She is also feeling tired. She feels drained and does not feel like anything is help to fuel her. Please advise      KP

## 2014-08-29 NOTE — Telephone Encounter (Signed)
Message left to call the office.    KP 

## 2014-08-30 ENCOUNTER — Ambulatory Visit
Admission: RE | Admit: 2014-08-30 | Discharge: 2014-08-30 | Disposition: A | Discharge status: Home / Self Care | Payer: 59 | Source: Ambulatory Visit

## 2014-08-30 ENCOUNTER — Other Ambulatory Visit: Payer: Self-pay

## 2014-08-30 DIAGNOSIS — Z1231 Encounter for screening mammogram for malignant neoplasm of breast: Secondary | ICD-10-CM

## 2014-08-31 NOTE — Telephone Encounter (Signed)
Patient returned phone call. Best # [phone]

## 2014-08-31 NOTE — Telephone Encounter (Signed)
Detailed message left on VM advising Ref has been placed to the ENT.      KP

## 2014-09-01 NOTE — Telephone Encounter (Signed)
Pt would like for you to call her back, states you told her to give you a call if she had questions, states she has questions .

## 2014-09-01 NOTE — Telephone Encounter (Signed)
Lmom.       KP

## 2014-09-01 NOTE — Telephone Encounter (Signed)
Patient stated she has increased her exercise and taking a multivitamin and still feels really tired and she is not sure why she is still feeling bad, wants to know if you had any ideas? She is taking the vitamin D as well. TSH was WNL and she is taking her B-12.     KP

## 2014-09-01 NOTE — Telephone Encounter (Signed)
Not sure why--- it takes a while for exercise to help Is she getting enough sleep--- is she getting good sleep?  Maybe a sleep study is needed.

## 2014-09-04 NOTE — Telephone Encounter (Signed)
Patient returned phone call. Best # [phone]

## 2014-09-04 NOTE — Telephone Encounter (Signed)
She said she had sleep Apnea and she has not had a sleep study done in years. She will try the Melatonin and let us know if that helps, if not she will have the sleep study done.     KP

## 2014-09-18 ENCOUNTER — Ambulatory Visit: Payer: 59 | Admitting: Family Medicine

## 2014-09-19 ENCOUNTER — Ambulatory Visit (INDEPENDENT_AMBULATORY_CARE_PROVIDER_SITE_OTHER): Payer: 59 | Admitting: Family Medicine

## 2014-09-19 ENCOUNTER — Encounter: Payer: Self-pay | Admitting: Family Medicine

## 2014-09-19 VITALS — BP 114/70 | HR 51 | Temp 98.1°F | Wt 234.6 lb

## 2014-09-19 DIAGNOSIS — I10 Essential (primary) hypertension: Secondary | ICD-10-CM

## 2014-09-19 DIAGNOSIS — R748 Abnormal levels of other serum enzymes: Secondary | ICD-10-CM

## 2014-09-19 DIAGNOSIS — E538 Deficiency of other specified B group vitamins: Secondary | ICD-10-CM

## 2014-09-19 DIAGNOSIS — E559 Vitamin D deficiency, unspecified: Secondary | ICD-10-CM

## 2014-09-19 DIAGNOSIS — R5383 Other fatigue: Secondary | ICD-10-CM | POA: Diagnosis not present

## 2014-09-19 DIAGNOSIS — G4700 Insomnia, unspecified: Secondary | ICD-10-CM

## 2014-09-19 LAB — CBC WITH DIFFERENTIAL/PLATELET
Basophils Absolute: 0 10*3/uL (ref 0.0–0.1)
Basophils Relative: 0.4 % (ref 0.0–3.0)
Eosinophils Absolute: 0.1 10*3/uL (ref 0.0–0.7)
Eosinophils Relative: 1.4 % (ref 0.0–5.0)
HCT: 38.9 % (ref 36.0–46.0)
Hemoglobin: 13 g/dL (ref 12.0–15.0)
Lymphocytes Relative: 37.5 % (ref 12.0–46.0)
Lymphs Abs: 2.5 10*3/uL (ref 0.7–4.0)
MCHC: 33.5 g/dL (ref 30.0–36.0)
MCV: 86.6 fl (ref 78.0–100.0)
MONOS PCT: 6.5 % (ref 3.0–12.0)
Monocytes Absolute: 0.4 10*3/uL (ref 0.1–1.0)
NEUTROS ABS: 3.6 10*3/uL (ref 1.4–7.7)
NEUTROS PCT: 54.2 % (ref 43.0–77.0)
Platelets: 469 10*3/uL — ABNORMAL HIGH (ref 150.0–400.0)
RBC: 4.49 Mil/uL (ref 3.87–5.11)
RDW: 14.1 % (ref 11.5–15.5)
WBC: 6.6 10*3/uL (ref 4.0–10.5)

## 2014-09-19 LAB — HEPATIC FUNCTION PANEL
ALT: 14 U/L (ref 0–35)
AST: 21 U/L (ref 0–37)
Albumin: 4.1 g/dL (ref 3.5–5.2)
Alkaline Phosphatase: 149 U/L — ABNORMAL HIGH (ref 39–117)
BILIRUBIN DIRECT: 0.1 mg/dL (ref 0.0–0.3)
BILIRUBIN TOTAL: 0.4 mg/dL (ref 0.2–1.2)
Total Protein: 7.1 g/dL (ref 6.0–8.3)

## 2014-09-19 LAB — VITAMIN B12: VITAMIN B 12: 814 pg/mL (ref 211–911)

## 2014-09-19 MED ORDER — VITAMIN D (ERGOCALCIFEROL) 1.25 MG (50000 UNIT) PO CAPS: 50000.0000 [IU] | ORAL_CAPSULE | ORAL | Status: DC

## 2014-09-19 NOTE — Patient Instructions (Signed)
Vitamin D Deficiency Vitamin D is an important vitamin that your body needs. Having too little of it in your body is called a deficiency. A very bad deficiency can make your bones soft and can cause a condition called rickets.  Vitamin D is important to your body for different reasons, such as:   It helps your body absorb 2 minerals called calcium and phosphorus.  It helps make your bones healthy.  It may prevent some diseases, such as diabetes and multiple sclerosis.  It helps your muscles and heart. You can get vitamin D in several ways. It is a natural part of some foods. The vitamin is also added to some dairy products and cereals. Some people take vitamin D supplements. Also, your body makes vitamin D when you are in the sun. It changes the sun's rays into a form of the vitamin that your body can use. CAUSES   Not eating enough foods that contain vitamin D.  Not getting enough sunlight.  Having certain digestive system diseases that make it hard to absorb vitamin D. These diseases include Crohn's disease, chronic pancreatitis, and cystic fibrosis.  Having a surgery in which part of the stomach or small intestine is removed.  Being obese. Fat cells pull vitamin D out of your blood. That means that obese people may not have enough vitamin D left in their blood and in other body tissues.  Having chronic kidney or liver disease. RISK FACTORS Risk factors are things that make you more likely to develop a vitamin D deficiency. They include:  Being older.  Not being able to get outside very much.  Living in a nursing home.  Having had broken bones.  Having weak or thin bones (osteoporosis).  Having a disease or condition that changes how your body absorbs vitamin D.  Having dark skin.  Some medicines such as seizure medicines or steroids.  Being overweight or obese. SYMPTOMS Mild cases of vitamin D deficiency may not have any symptoms. If you have a very bad case, symptoms  may include:  Bone pain.  Muscle pain.  Falling often.  Broken bones caused by a minor injury, due to osteoporosis. DIAGNOSIS A blood test is the best way to tell if you have a vitamin D deficiency. TREATMENT Vitamin D deficiency can be treated in different ways. Treatment for vitamin D deficiency depends on what is causing it. Options include:  Taking vitamin D supplements.  Taking a calcium supplement. Your caregiver will suggest what dose is best for you. HOME CARE INSTRUCTIONS  Take any supplements that your caregiver prescribes. Follow the directions carefully. Take only the suggested amount.  Have your blood tested 2 months after you start taking supplements.  Eat foods that contain vitamin D. Healthy choices include:  Fortified dairy products, cereals, or juices. Fortified means vitamin D has been added to the food. Check the label on the package to be sure.  Fatty fish like salmon or trout.  Eggs.  Oysters.  Do not use a tanning bed.  Keep your weight at a healthy level. Lose weight if you need to.  Keep all follow-up appointments. Your caregiver will need to perform blood tests to make sure your vitamin D deficiency is going away. SEEK MEDICAL CARE IF:  You have any questions about your treatment.  You continue to have symptoms of vitamin D deficiency.  You have nausea or vomiting.  You are constipated.  You feel confused.  You have severe abdominal or back pain. MAKE   SURE YOU:  Understand these instructions.  Will watch your condition.  Will get help right away if you are not doing well or get worse. Document Released: 08/04/2011 Document Revised: 09/06/2012 Document Reviewed: 08/04/2011 Exit[employer] Patient Information 2015 [employer]. This information is not intended to replace advice given to you by your health care provider. Make sure you discuss any questions you have with your health care provider.  

## 2014-09-19 NOTE — Progress Notes (Signed)
Patient ID: [name], female    DOB: 1965/09/29  Age: 49 y.o. MRN: [id number]    Subjective:  Subjective HPI [name] presents for f/u bp , low vita b12 and vita d and elevated alk phos.    Review of Systems  Constitutional: Positive for fatigue. Negative for activity change, appetite change and unexpected weight change.  Respiratory: Negative for cough and shortness of breath.   Cardiovascular: Negative for chest pain and palpitations.  Gastrointestinal: Negative for nausea, abdominal pain, diarrhea, constipation, blood in stool, abdominal distention and anal bleeding.  Psychiatric/Behavioral: Positive for sleep disturbance. Negative for behavioral problems and dysphoric mood. The patient is not nervous/anxious.     History Past Medical History  Diagnosis Date  . Hypertension   . GERD (gastroesophageal reflux disease)   . IBS (irritable bowel syndrome)   . Diastolic dysfunction   . Obstructive sleep apnea   . Goiter   . Diverticulosis of colon   . Anemia, iron deficiency   . Thrombocytosis   . Morbid obesity   . Hiatal hernia   . Allergic rhinitis, cause unspecified   . Undiagnosed cardiac murmurs   . COPD (chronic obstructive pulmonary disease)     She has past surgical history that includes Lipoma excision; Endometrial ablation (12/08); Bariatric Surgery (06/17/2010); Spine surgery (02/10/2012); Laparoscopic assisted vaginal hysterectomy (N/A, 06/22/2014); Bilateral salpingectomy (Bilateral, 06/22/2014); and Cystoscopy (N/A, 06/22/2014).   Her family history includes Arrhythmia in her mother; Breast cancer in her mother; Cancer in her father; Cancer (age of onset: 70) in her maternal aunt; Cancer (age of onset: 96) in her mother; Colon polyps in her father; Heart disease in her mother; Hypertension in her father; Kidney cancer in her father; Lymphoma in her father. There is no history of Colon cancer.She reports that she has never smoked. She has never used smokeless  tobacco. She reports that she does not drink alcohol or use illicit drugs.  Current Outpatient Prescriptions on File Prior to Visit  Medication Sig Dispense Refill  . aspirin 81 MG tablet Take 162 mg by mouth daily.    Marland Kitchen BLACK COHOSH PO Take 1 capsule by mouth daily.    . Cyanocobalamin (VITAMIN B-12 PO) Take 1 tablet by mouth daily.    . hydrochlorothiazide (HYDRODIURIL) 25 MG tablet TAKE 1 TABLET (25 MG TOTAL) BY MOUTH DAILY. 30 tablet 5  . ibuprofen (ADVIL,MOTRIN) 600 MG tablet 1  po  pc  every 6 hours x 5 days then as needed for pain 30 tablet 1  . levothyroxine (SYNTHROID, LEVOTHROID) 88 MCG tablet TAKE 1 TABLET (88 MCG TOTAL) BY MOUTH DAILY. 30 tablet 10  . loratadine (CLARITIN) 10 MG tablet Take 10 mg by mouth daily.    Marland Kitchen losartan (COZAAR) 100 MG tablet Take 1 tablet (100 mg total) by mouth daily. 90 tablet 3  . potassium chloride SA (K-DUR,KLOR-CON) 20 MEQ tablet Take 1 tablet (20 mEq total) by mouth daily. 30 tablet 5  . Vitamin D, Ergocalciferol, (DRISDOL) 50000 UNITS CAPS capsule Take 1 capsule (50,000 Units total) by mouth every 7 (seven) days. 4 capsule 2   No current facility-administered medications on file prior to visit.     Objective:  Objective Physical Exam BP 114/70 mmHg  Pulse 51  Temp(Src) 98.1 F (36.7 C) (Oral)  Wt 234 lb 9.6 oz (106.414 kg)  SpO2 98%  LMP 01/20/2011 Wt Readings from Last 3 Encounters:  09/19/14 234 lb 9.6 oz (106.414 kg)  07/28/14 238 lb 9.6 oz (108.228  kg)  06/21/14 238 lb (107.956 kg)     Lab Results  Component Value Date   WBC 11.0* 06/23/2014   HGB 11.2* 06/23/2014   HCT 32.8* 06/23/2014   PLT 361 06/23/2014   GLUCOSE 90 07/28/2014   CHOL 179 07/28/2014   TRIG 91.0 07/28/2014   HDL 61.20 07/28/2014   LDLCALC 100* 07/28/2014   ALT 15 07/28/2014   AST 23 07/28/2014   NA 137 07/28/2014   K 3.9 07/28/2014   CL 100 07/28/2014   CREATININE 0.64 07/28/2014   BUN 13 07/28/2014   CO2 32 07/28/2014   TSH 1.318 07/28/2014    HGBA1C 5.8 07/28/2014    Mm Digital Screening Bilateral  08/31/2014   CLINICAL DATA:  Screening.  EXAM: DIGITAL SCREENING BILATERAL MAMMOGRAM WITH CAD  COMPARISON:  Previous exam(s).  ACR Breast Density Category b: There are scattered areas of fibroglandular density.  FINDINGS: There are no findings suspicious for malignancy. Images were processed with CAD.  IMPRESSION: No mammographic evidence of malignancy. A result letter of this screening mammogram will be mailed directly to the patient.  RECOMMENDATION: Screening mammogram in one year. (Code:[id number])  BI-RADS CATEGORY  1: Negative.   Electronically Signed   By: [name] M.D.   On: 08/31/2014 15:16     Assessment & Plan:  Plan I have discontinued Ms. Rengel's clindamycin-benzoyl peroxide, tretinoin microspheres, and Guaifenesin. I am also having her maintain her levothyroxine, losartan, loratadine, Cyanocobalamin (VITAMIN B-12 PO), BLACK COHOSH PO, aspirin, hydrochlorothiazide, ibuprofen, potassium chloride SA, and Vitamin D (Ergocalciferol).  No orders of the defined types were placed in this encounter.    Problem List Items Addressed This Visit    None     1. Insomnia   - Ambulatory referral to Pulmonology  2. Elevated alkaline phosphatase level Check labs today - Hepatic function panel - Vitamin B12 - CBC with Differential/Platelet  3. B12 deficiency   - Vitamin B12  4. Other fatigue    5. Essential hypertension stable  6. Vitamin D deficiency   - Vitamin D, Ergocalciferol, (DRISDOL) 50000 UNITS CAPS capsule; Take 1 capsule (50,000 Units total) by mouth every 7 (seven) days.  Dispense: 4 capsule; Refill: 2  Follow-up: No Follow-up on file.  [name] Koyanagi, DO

## 2014-09-19 NOTE — Progress Notes (Signed)
Pre visit review using our clinic review tool, if applicable. No additional management support is needed unless otherwise documented below in the visit note. 

## 2014-09-25 ENCOUNTER — Other Ambulatory Visit: Payer: Self-pay

## 2014-09-25 DIAGNOSIS — R945 Abnormal results of liver function studies: Principal | ICD-10-CM

## 2014-09-25 DIAGNOSIS — R7989 Other specified abnormal findings of blood chemistry: Secondary | ICD-10-CM

## 2014-10-02 ENCOUNTER — Ambulatory Visit (HOSPITAL_BASED_OUTPATIENT_CLINIC_OR_DEPARTMENT_OTHER)
Admission: RE | Admit: 2014-10-02 | Discharge: 2014-10-02 | Disposition: A | Discharge status: Home / Self Care | Payer: 59 | Source: Ambulatory Visit | Attending: Family Medicine | Admitting: Family Medicine

## 2014-10-02 DIAGNOSIS — R945 Abnormal results of liver function studies: Secondary | ICD-10-CM

## 2014-10-02 DIAGNOSIS — R7989 Other specified abnormal findings of blood chemistry: Secondary | ICD-10-CM | POA: Diagnosis present

## 2014-10-03 ENCOUNTER — Encounter: Payer: Self-pay | Admitting: Family Medicine

## 2014-10-03 ENCOUNTER — Other Ambulatory Visit: Payer: Self-pay | Admitting: Family Medicine

## 2014-10-03 DIAGNOSIS — D691 Qualitative platelet defects: Secondary | ICD-10-CM

## 2014-10-12 ENCOUNTER — Telehealth: Payer: Self-pay | Admitting: Hematology & Oncology

## 2014-10-12 NOTE — Telephone Encounter (Signed)
I spoke w NEW PATIENT today to remind them of their appointment with Dr. Ennever. Also, advised them to bring all medication bottles and insurance card information. ° °

## 2014-10-17 ENCOUNTER — Telehealth: Payer: Self-pay | Admitting: Hematology & Oncology

## 2014-10-17 NOTE — Telephone Encounter (Signed)
Patient called and cx 10/24/14 apt and resch for 10/27/14

## 2014-10-24 ENCOUNTER — Other Ambulatory Visit: Payer: 59

## 2014-10-24 ENCOUNTER — Ambulatory Visit: Payer: 59

## 2014-10-24 ENCOUNTER — Ambulatory Visit: Payer: 59 | Admitting: Family

## 2014-10-27 ENCOUNTER — Ambulatory Visit: Payer: 59

## 2014-10-27 ENCOUNTER — Ambulatory Visit: Payer: 59 | Admitting: Family

## 2014-10-27 ENCOUNTER — Other Ambulatory Visit: Payer: 59

## 2014-11-01 ENCOUNTER — Other Ambulatory Visit: Payer: Self-pay | Admitting: Family

## 2014-11-01 DIAGNOSIS — R7989 Other specified abnormal findings of blood chemistry: Secondary | ICD-10-CM

## 2014-11-02 ENCOUNTER — Ambulatory Visit (HOSPITAL_BASED_OUTPATIENT_CLINIC_OR_DEPARTMENT_OTHER): Payer: 59 | Admitting: Family

## 2014-11-02 ENCOUNTER — Other Ambulatory Visit (HOSPITAL_BASED_OUTPATIENT_CLINIC_OR_DEPARTMENT_OTHER): Payer: 59

## 2014-11-02 ENCOUNTER — Ambulatory Visit: Payer: 59

## 2014-11-02 VITALS — BP 140/78 | HR 52 | Temp 97.7°F | Wt 237.8 lb

## 2014-11-02 DIAGNOSIS — R590 Localized enlarged lymph nodes: Secondary | ICD-10-CM | POA: Diagnosis not present

## 2014-11-02 DIAGNOSIS — D473 Essential (hemorrhagic) thrombocythemia: Secondary | ICD-10-CM | POA: Diagnosis not present

## 2014-11-02 DIAGNOSIS — R5383 Other fatigue: Secondary | ICD-10-CM

## 2014-11-02 DIAGNOSIS — Z807 Family history of other malignant neoplasms of lymphoid, hematopoietic and related tissues: Secondary | ICD-10-CM | POA: Diagnosis not present

## 2014-11-02 DIAGNOSIS — N939 Abnormal uterine and vaginal bleeding, unspecified: Secondary | ICD-10-CM

## 2014-11-02 DIAGNOSIS — R7989 Other specified abnormal findings of blood chemistry: Secondary | ICD-10-CM

## 2014-11-02 DIAGNOSIS — D75839 Thrombocytosis, unspecified: Secondary | ICD-10-CM

## 2014-11-02 LAB — CBC WITH DIFFERENTIAL (CANCER CENTER ONLY)
BASO#: 0 10*3/uL (ref 0.0–0.2)
BASO%: 0.4 % (ref 0.0–2.0)
EOS ABS: 0.1 10*3/uL (ref 0.0–0.5)
EOS%: 1.8 % (ref 0.0–7.0)
HCT: 38 % (ref 34.8–46.6)
HGB: 12.7 g/dL (ref 11.6–15.9)
LYMPH#: 3 10*3/uL (ref 0.9–3.3)
LYMPH%: 45 % (ref 14.0–48.0)
MCH: 29.5 pg (ref 26.0–34.0)
MCHC: 33.4 g/dL (ref 32.0–36.0)
MCV: 88 fL (ref 81–101)
MONO#: 0.5 10*3/uL (ref 0.1–0.9)
MONO%: 6.7 % (ref 0.0–13.0)
NEUT%: 46.1 % (ref 39.6–80.0)
NEUTROS ABS: 3.1 10*3/uL (ref 1.5–6.5)
Platelets: 423 10*3/uL — ABNORMAL HIGH (ref 145–400)
RBC: 4.31 10*6/uL (ref 3.70–5.32)
RDW: 13.5 % (ref 11.1–15.7)
WBC: 6.7 10*3/uL (ref 3.9–10.0)

## 2014-11-02 LAB — LACTATE DEHYDROGENASE: LDH: 164 U/L (ref 94–250)

## 2014-11-02 LAB — IRON AND TIBC CHCC
%SAT: 21 % (ref 21–57)
Iron: 61 ug/dL (ref 41–142)
TIBC: 283 ug/dL (ref 236–444)
UIBC: 222 ug/dL (ref 120–384)

## 2014-11-02 LAB — CHCC SATELLITE - SMEAR

## 2014-11-02 LAB — FERRITIN CHCC: Ferritin: 34 ng/ml (ref 9–269)

## 2014-11-02 NOTE — Progress Notes (Signed)
Hematology/Oncology Consultation   Name: Emmerie Battaglia      MRN: [id number]    Location: Room/bed info not found  Date: 11/02/2014 Time:9:42 AM   REFERRING PHYSICIAN: Garnet Koyanagi  REASON FOR CONSULT: abnormal platelts, elevated liver enzymes and family history of lymphoma   DIAGNOSIS:  1. Thrombocytosis 2. Family history of lymphoma (father) 3. Hepatic steatosis   HISTORY OF PRESENT ILLNESS: Ms. Rockers is a very pleasant 49 yo African American female. We saw her back in November 2015 for an elevated platelet count. Her platelets today are 423. So far, this has not been a problem for her. Her work-up was negative.  She stopped taking aspirin because is was giving her indigestion.  She had gastric bypass 3 years ago and has done well since. She states that she feels "drained of energy all the time." We will see what her iron studies from today show. She may need a dose of Feraheme.  She did have a hysterectomy in January. Her uterus was removed due to fibroids. She still has her ovaries.  Her mammogram in April was negative for malignancy.  Unfortunately, her father passed away 2 years ago from large B cell lymphoma. She has a 12 by 6 mm lymph node on [place]. She is concerned about this. We will get a CT scan to evaluate.  She has had no problem with infections.  She has hypothyroidism and is on synthroid. She does have thyroid nodules which are too small too biopsy at this time.  She denies fever, chills, n/v, cough, rash, headache, dizziness, SOB, chest pain, palpitations, abdominal pain, constipation, diarrhea, blood in urine or stool. 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 No new aches or pains.  She is eating healthy and staying hydrated with water. She is also trying to walk to lose some weight.  She is still at home enjoying her  girls and grand baby.   ROS: All other 10 point review of systems is negative.   PAST MEDICAL HISTORY:   Past Medical History  Diagnosis Date  . Hypertension   . GERD (gastroesophageal reflux disease)   . IBS (irritable bowel syndrome)   . Diastolic dysfunction   . Obstructive sleep apnea   . Goiter   . Diverticulosis of colon   . Anemia, iron deficiency   . Thrombocytosis   . Morbid obesity   . Hiatal hernia   . Allergic rhinitis, cause unspecified   . Undiagnosed cardiac murmurs   . COPD (chronic obstructive pulmonary disease)     ALLERGIES: Allergies  Allergen Reactions  . Penicillins Other (See Comments)    Causes severe, difficult to treat yeast infections      MEDICATIONS:  Current Outpatient Prescriptions on File Prior to Visit  Medication Sig Dispense Refill  . aspirin 81 MG tablet Take 162 mg by mouth daily.    Marland Kitchen BLACK COHOSH PO Take 1 capsule by mouth daily.    . Cyanocobalamin (VITAMIN B-12 PO) Take 1 tablet by mouth daily.    . hydrochlorothiazide (HYDRODIURIL) 25 MG tablet TAKE 1 TABLET (25 MG TOTAL) BY MOUTH DAILY. 30 tablet 5  . ibuprofen (ADVIL,MOTRIN) 600 MG tablet 1  po  pc  every 6 hours x 5 days then as needed for pain 30 tablet 1  . levothyroxine (SYNTHROID, LEVOTHROID) 88 MCG tablet TAKE  1 TABLET (88 MCG TOTAL) BY MOUTH DAILY. 30 tablet 10  . loratadine (CLARITIN) 10 MG tablet Take 10 mg by mouth daily.    Marland Kitchen losartan (COZAAR) 100 MG tablet Take 1 tablet (100 mg total) by mouth daily. 90 tablet 3  . potassium chloride SA (K-DUR,KLOR-CON) 20 MEQ tablet Take 1 tablet (20 mEq total) by mouth daily. 30 tablet 5  . Vitamin D, Ergocalciferol, (DRISDOL) 50000 UNITS CAPS capsule Take 1 capsule (50,000 Units total) by mouth every 7 (seven) days. 4 capsule 2   No current facility-administered medications on file prior to visit.     PAST SURGICAL HISTORY Past Surgical History  Procedure Laterality Date  . Lipoma excision      upper back  . Endometrial  ablation  12/08  . Bariatric surgery  06/17/2010  . Spine surgery  02/10/2012    L4-l5 fusion--cohen  . Laparoscopic assisted vaginal hysterectomy N/A 06/22/2014    Procedure: LAPAROSCOPIC ASSISTED VAGINAL HYSTERECTOMY;  Surgeon: Crawford Givens, MD;  Location: [facility];  Service: Gynecology;  Laterality: N/A;  . Bilateral salpingectomy Bilateral 06/22/2014    Procedure: BILATERAL SALPINGECTOMY;  Surgeon: Crawford Givens, MD;  Location: [facility];  Service: Gynecology;  Laterality: Bilateral;  . Cystoscopy N/A 06/22/2014    Procedure: CYSTOSCOPY;  Surgeon: Crawford Givens, MD;  Location: [facility];  Service: Gynecology;  Laterality: N/A;    FAMILY HISTORY: Family History  Problem Relation Age of Onset  . Breast cancer Mother   . Arrhythmia Mother   . Heart disease Mother   . Cancer Mother 65    breast  . Hypertension Father   . Kidney cancer Father   . Colon polyps Father   . Lymphoma Father     Diffused Large B-cell Lymphoma  . Cancer Father     lymphoma  . Colon cancer Neg Hx   . Cancer Maternal Aunt 58    breast cancer    SOCIAL HISTORY:  reports that she has never smoked. She has never used smokeless tobacco. She reports that she does not drink alcohol or use illicit drugs.  PERFORMANCE STATUS: The patient's performance status is 0 - Asymptomatic  PHYSICAL EXAM: Most Recent Vital Signs: Blood pressure 140/78, pulse 52, temperature 97.7 F (36.5 C), temperature source Oral, weight 237 lb 12.8 oz (107.865 kg), last menstrual period 01/20/2011. BP 140/78 mmHg  Pulse 52  Temp(Src) 97.7 F (36.5 C) (Oral)  Wt 237 lb 12.8 oz (107.865 kg)  LMP 01/20/2011  General Appearance:    Alert, cooperative, no distress, appears stated age  Head:    Normocephalic, without obvious abnormality, atraumatic  Eyes:    PERRL, conjunctiva/corneas clear, EOM's intact, fundi    benign, both eyes  Ears:    Normal TM's and external ear canals, both ears  Nose:   Nares normal, septum midline, mucosa normal,  no drainage    or sinus tenderness  Throat:   Lips, mucosa, and tongue normal; teeth and gums normal  Neck:   Supple, symmetrical, trachea midline, no adenopathy;    thyroid: no carotid, has some left superficial cervical adenopathy, No bruit or JVD  Back:     Symmetric, no curvature, ROM normal, no CVA tenderness  Lungs:     Clear to auscultation bilaterally, respirations unlabored  Chest Wall:    No tenderness or deformity   Heart:    Regular rate and rhythm, S1 and S2 normal, no murmur, rub   or gallop     Abdomen:  Soft, non-tender, bowel sounds active all four quadrants,    no masses, no organomegaly        Extremities:   Extremities normal, atraumatic, no cyanosis or edema  Pulses:   2+ and symmetric all extremities  Skin:   Skin color, texture, turgor normal, no rashes or lesions  Lymph nodes:   Cervical, supraclavicular, and axillary nodes normal  Neurologic:   CNII-XII intact, normal strength, sensation and reflexes    throughout    LABORATORY DATA:  Results for orders placed or performed in visit on 11/02/14 (from the past 48 hour(s))  CBC with Differential [facility])     Status: Abnormal   Collection Time: 11/02/14  8:30 AM  Result Value Ref Range   WBC 6.7 3.9 - 10.0 10e3/uL   RBC 4.31 3.70 - 5.32 10e6/uL   HGB 12.7 11.6 - 15.9 g/dL   HCT 38.0 34.8 - 46.6 %   MCV 88 81 - 101 fL   MCH 29.5 26.0 - 34.0 pg   MCHC 33.4 32.0 - 36.0 g/dL   RDW 13.5 11.1 - 15.7 %   Platelets 423 (H) 145 - 400 10e3/uL   NEUT# 3.1 1.5 - 6.5 10e3/uL   LYMPH# 3.0 0.9 - 3.3 10e3/uL   MONO# 0.5 0.1 - 0.9 10e3/uL   Eosinophils Absolute 0.1 0.0 - 0.5 10e3/uL   BASO# 0.0 0.0 - 0.2 10e3/uL   NEUT% 46.1 39.6 - 80.0 %   LYMPH% 45.0 14.0 - 48.0 %   MONO% 6.7 0.0 - 13.0 %   EOS% 1.8 0.0 - 7.0 %   BASO% 0.4 0.0 - 2.0 %      RADIOGRAPHY: No results found.     PATHOLOGY: None  ASSESSMENT/PLAN: Ms. Kueker is a very pleasant 49 yo [place] American female with a history of thrombocytosis.  So far, this has not been a problem for her.  She now has a 12 by 6 mm lymph node in her left neck. Her father passed away from [place] 2 years ago and is concerned about this.  We will get a CT of the neck for better evaluation.  Her CBC today was good. No anemia. Platelets are down at 423. We will see what her iron studies show. She is symptomatic with fatigue. We can certainly give her Feraheme if she is low.  We will see her Ct scan results and then schedule her follow-up at that time.  All questions were answered. She knows to call the clinic with any problems, questions or concerns. We can certainly see her much sooner if necessary.  The patient was discussed with and also seen by Dr. Marin Olp and he is in agreement with the aforementioned.   [facility]     Addendum:  I saw and examined patient with Sarah. I cannot a med that she has any obvious malignancy. However, we will go ahead and get a CT scan of the neck. The thyroid ultrasound is ambiguous as to whether or not there is anything malignant.  We will see what the CT scan shows. I will then plan to get her back afterwards if necessary.  We spent about 30 minutes with her today. It was good to see her again. Laurey Arrow

## 2014-11-09 ENCOUNTER — Institutional Professional Consult (permissible substitution): Payer: 59 | Admitting: Pulmonary Disease

## 2014-11-10 ENCOUNTER — Ambulatory Visit (HOSPITAL_BASED_OUTPATIENT_CLINIC_OR_DEPARTMENT_OTHER)
Admission: RE | Admit: 2014-11-10 | Discharge: 2014-11-10 | Disposition: A | Discharge status: Home / Self Care | Payer: 59 | Source: Ambulatory Visit | Attending: Family | Admitting: Family

## 2014-11-10 ENCOUNTER — Encounter: Payer: Self-pay | Admitting: Family

## 2014-11-10 ENCOUNTER — Other Ambulatory Visit (HOSPITAL_BASED_OUTPATIENT_CLINIC_OR_DEPARTMENT_OTHER): Payer: 59

## 2014-11-10 ENCOUNTER — Ambulatory Visit (HOSPITAL_BASED_OUTPATIENT_CLINIC_OR_DEPARTMENT_OTHER): Payer: 59 | Admitting: Family

## 2014-11-10 ENCOUNTER — Other Ambulatory Visit: Payer: Self-pay | Admitting: Nurse Practitioner

## 2014-11-10 VITALS — BP 112/57 | HR 49 | Temp 97.8°F | Resp 18 | Ht 63.0 in | Wt 234.0 lb

## 2014-11-10 DIAGNOSIS — G4730 Sleep apnea, unspecified: Secondary | ICD-10-CM

## 2014-11-10 DIAGNOSIS — D75839 Thrombocytosis, unspecified: Secondary | ICD-10-CM

## 2014-11-10 DIAGNOSIS — D473 Essential (hemorrhagic) thrombocythemia: Secondary | ICD-10-CM

## 2014-11-10 DIAGNOSIS — D508 Other iron deficiency anemias: Secondary | ICD-10-CM

## 2014-11-10 DIAGNOSIS — R221 Localized swelling, mass and lump, neck: Secondary | ICD-10-CM | POA: Diagnosis present

## 2014-11-10 DIAGNOSIS — Z807 Family history of other malignant neoplasms of lymphoid, hematopoietic and related tissues: Secondary | ICD-10-CM

## 2014-11-10 LAB — CBC WITH DIFFERENTIAL (CANCER CENTER ONLY)
BASO#: 0 10*3/uL (ref 0.0–0.2)
BASO%: 0.5 % (ref 0.0–2.0)
EOS%: 1.4 % (ref 0.0–7.0)
Eosinophils Absolute: 0.1 10*3/uL (ref 0.0–0.5)
HCT: 37.3 % (ref 34.8–46.6)
HGB: 12.6 g/dL (ref 11.6–15.9)
LYMPH#: 2.6 10*3/uL (ref 0.9–3.3)
LYMPH%: 40.2 % (ref 14.0–48.0)
MCH: 29.9 pg (ref 26.0–34.0)
MCHC: 33.8 g/dL (ref 32.0–36.0)
MCV: 88 fL (ref 81–101)
MONO#: 0.4 10*3/uL (ref 0.1–0.9)
MONO%: 6.2 % (ref 0.0–13.0)
NEUT#: 3.3 10*3/uL (ref 1.5–6.5)
NEUT%: 51.7 % (ref 39.6–80.0)
PLATELETS: 451 10*3/uL — AB (ref 145–400)
RBC: 4.22 10*6/uL (ref 3.70–5.32)
RDW: 13.4 % (ref 11.1–15.7)
WBC: 6.5 10*3/uL (ref 3.9–10.0)

## 2014-11-10 LAB — COMPREHENSIVE METABOLIC PANEL
ALK PHOS: 116 U/L (ref 39–117)
ALT: 14 U/L (ref 0–35)
AST: 19 U/L (ref 0–37)
Albumin: 3.8 g/dL (ref 3.5–5.2)
BUN: 17 mg/dL (ref 6–23)
CALCIUM: 8.8 mg/dL (ref 8.4–10.5)
CHLORIDE: 101 meq/L (ref 96–112)
CO2: 29 mEq/L (ref 19–32)
Creatinine, Ser: 0.62 mg/dL (ref 0.50–1.10)
Glucose, Bld: 111 mg/dL — ABNORMAL HIGH (ref 70–99)
POTASSIUM: 3.4 meq/L — AB (ref 3.5–5.3)
SODIUM: 139 meq/L (ref 135–145)
TOTAL PROTEIN: 6.9 g/dL (ref 6.0–8.3)
Total Bilirubin: 0.4 mg/dL (ref 0.2–1.2)

## 2014-11-10 MED ORDER — IOHEXOL 300 MG/ML  SOLN
75.0000 mL | Freq: Once | INTRAMUSCULAR | Status: AC | PRN
  Administered 2014-11-10: 75 mL via INTRAVENOUS

## 2014-11-10 NOTE — Progress Notes (Signed)
Hematology and Oncology Follow Up Visit  [name] [id number] 11/08/1965 49 y.o. 11/10/2014   Principle Diagnosis:  1. Thrombocytosis 2. Family history of lymphoma (father)  Current Therapy:  Observation    Interim History:  [name] is here today for a follow-up. She is still feeling fatigued. She is sleeping at night but waking not feeling rested. She has a history of sleep apnea but stopped using her CPAP machine after her gastric bypass. She had an appointment with the sleep center but had to cancel it to take her mom to the doctor. She is going to reschedule this.  She had a CT of the neck this morning which showed normal appearing lymph nodes.  She has had no fever, chills, n/v, cough, rash, dizziness, SOB, chest pain, palpitations, abdominal pain, constipation, diarrhea, blood in urine or stool.  No swelling, tenderness, numbness or tingling in her extremities. No new aches or pains.  She is still on a healthy diet and drinking plenty of fluids.     Medications:    Medication List       This list is accurate as of: 11/10/14 12:11 PM.  Always use your most recent med list.               aspirin 81 MG tablet  Take 162 mg by mouth daily.     BLACK COHOSH PO  Take 1 capsule by mouth daily.     hydrochlorothiazide 25 MG tablet  Commonly known as:  HYDRODIURIL  TAKE 1 TABLET (25 MG TOTAL) BY MOUTH DAILY.     ibuprofen 600 MG tablet  Commonly known as:  ADVIL,MOTRIN  1  po  pc  every 6 hours x 5 days then as needed for pain     ipratropium 0.03 % nasal spray  Commonly known as:  ATROVENT     levothyroxine 88 MCG tablet  Commonly known as:  SYNTHROID, LEVOTHROID  TAKE 1 TABLET (88 MCG TOTAL) BY MOUTH DAILY.     loratadine 10 MG tablet  Commonly known as:  CLARITIN  Take 10 mg by mouth daily.     losartan 100 MG tablet  Commonly known as:  COZAAR  Take 1 tablet (100 mg total) by mouth daily.     milk thistle 175 MG tablet  Take 240 mg by mouth daily.     potassium chloride SA 20 MEQ tablet  Commonly known as:  K-DUR,KLOR-CON  Take 1 tablet (20 mEq total) by mouth daily.     VITAMIN B-12 PO  Take 1 tablet by mouth daily.     Vitamin D (Ergocalciferol) 50000 UNITS Caps capsule  Commonly known as:  DRISDOL  Take 1 capsule (50,000 Units total) by mouth every 7 (seven) days.     vitamin E 200 UNIT capsule  Take 200 Units by mouth daily.        Allergies:  Allergies  Allergen Reactions  . Penicillins Other (See Comments)    Causes severe, difficult to treat yeast infections    Past Medical History, Surgical history, Social history, and Family History were reviewed and updated.  Review of Systems: All other 10 point review of systems is negative.   Physical Exam:  height is 5\' 3"  (1.6 m) and weight is 234 lb (106.142 kg). Her oral temperature is 97.8 F (36.6 C). Her blood pressure is 112/57 and her pulse is 49. Her respiration is 18.   Wt Readings from Last 3 Encounters:  11/10/14 234 lb (106.142 kg)  11/02/14 237 lb 12.8 oz (107.865 kg)  09/19/14 234 lb 9.6 oz (106.414 kg)    Ocular: Sclerae unicteric, pupils equal, round and reactive to light Ear-nose-throat: Oropharynx clear, dentition fair Lymphatic: No cervical or supraclavicular adenopathy Lungs no rales or rhonchi, good excursion bilaterally Heart regular rate and rhythm, no murmur appreciated Abd soft, nontender, positive bowel sounds MSK no focal spinal tenderness, no joint edema Neuro: non-focal, well-oriented, appropriate affect Breasts: Deferred  Lab Results  Component Value Date   WBC 6.5 11/10/2014   HGB 12.6 11/10/2014   HCT 37.3 11/10/2014   MCV 88 11/10/2014   PLT 451* 11/10/2014   Lab Results  Component Value Date   FERRITIN 34 11/02/2014   IRON 61 11/02/2014   TIBC 283 11/02/2014   UIBC 222 11/02/2014   IRONPCTSAT 21 11/02/2014   Lab Results  Component Value Date   RETICCTPCT 1.8 04/19/2014   RBC 4.22 11/10/2014   RETICCTABS 79.2  04/19/2014   No results found for: KPAFRELGTCHN, LAMBDASER, KAPLAMBRATIO No results found for: IGGSERUM, IGA, IGMSERUM No results found for: Odetta Pink, SPEI   Chemistry      Component Value Date/Time   NA 137 07/28/2014 1407   K 3.9 07/28/2014 1407   CL 100 07/28/2014 1407   CO2 32 07/28/2014 1407   BUN 13 07/28/2014 1407   CREATININE 0.64 07/28/2014 1407   CREATININE 0.72 01/10/2011 1545      Component Value Date/Time   CALCIUM 9.4 07/28/2014 1407   ALKPHOS 149* 09/19/2014 1040   AST 21 09/19/2014 1040   ALT 14 09/19/2014 1040   BILITOT 0.4 09/19/2014 1040     Impression and Plan: [name] is a very pleasant 49 yo African American female with a history of thrombocytosis. So far, this has not been a problem for her. Her platelet count today is 451. She has had no episodes of bleeding or bruising.  She is still feeling fatigued and plans to have a sleep study done. She has a history of sleep apnea but stopped using her CPAP after her gastric bypass.  Her lab work today looks good. She has no anemia. Her differential is negative. Her neck CT this morning showed normal lymph nodes.  All questions were answered. We will follow-up with her as needed now She will contact us with any questions or concerns and we will schedule an appointment at that time.    Eliezer Bottom, NP 6/17/201612:11 PM

## 2014-12-04 ENCOUNTER — Other Ambulatory Visit: Payer: Self-pay | Admitting: Family Medicine

## 2014-12-12 ENCOUNTER — Other Ambulatory Visit: Payer: Self-pay

## 2014-12-12 MED ORDER — LEVOTHYROXINE SODIUM 88 MCG PO TABS: ORAL_TABLET | ORAL | Status: DC

## 2014-12-16 ENCOUNTER — Other Ambulatory Visit: Payer: Self-pay | Admitting: Family Medicine

## 2015-01-09 ENCOUNTER — Other Ambulatory Visit: Payer: Self-pay

## 2015-01-09 DIAGNOSIS — I10 Essential (primary) hypertension: Secondary | ICD-10-CM

## 2015-01-09 MED ORDER — LOSARTAN POTASSIUM 100 MG PO TABS: 100.0000 mg | ORAL_TABLET | Freq: Every day | ORAL | Status: DC

## 2015-03-26 ENCOUNTER — Ambulatory Visit (INDEPENDENT_AMBULATORY_CARE_PROVIDER_SITE_OTHER): Payer: 59 | Admitting: Family Medicine

## 2015-03-26 ENCOUNTER — Encounter: Payer: Self-pay | Admitting: Family Medicine

## 2015-03-26 VITALS — BP 126/78 | HR 58 | Temp 97.9°F | Ht 63.0 in | Wt 233.4 lb

## 2015-03-26 DIAGNOSIS — E039 Hypothyroidism, unspecified: Secondary | ICD-10-CM | POA: Diagnosis not present

## 2015-03-26 DIAGNOSIS — I10 Essential (primary) hypertension: Secondary | ICD-10-CM | POA: Diagnosis not present

## 2015-03-26 LAB — COMPREHENSIVE METABOLIC PANEL
ALK PHOS: 145 U/L — AB (ref 39–117)
ALT: 13 U/L (ref 0–35)
AST: 17 U/L (ref 0–37)
Albumin: 3.9 g/dL (ref 3.5–5.2)
BILIRUBIN TOTAL: 0.3 mg/dL (ref 0.2–1.2)
BUN: 13 mg/dL (ref 6–23)
CO2: 30 mEq/L (ref 19–32)
Calcium: 9.3 mg/dL (ref 8.4–10.5)
Chloride: 102 mEq/L (ref 96–112)
Creatinine, Ser: 0.57 mg/dL (ref 0.40–1.20)
GFR: 144.45 mL/min (ref >60.00)
GLUCOSE: 83 mg/dL (ref 70–99)
POTASSIUM: 3.7 meq/L (ref 3.5–5.1)
SODIUM: 141 meq/L (ref 135–145)
Total Protein: 7.3 g/dL (ref 6.0–8.3)

## 2015-03-26 LAB — CBC WITH DIFFERENTIAL/PLATELET
BASOS ABS: 0 10*3/uL (ref 0.0–0.1)
Basophils Relative: 0.4 % (ref 0.0–3.0)
Eosinophils Absolute: 0.1 10*3/uL (ref 0.0–0.7)
Eosinophils Relative: 1 % (ref 0.0–5.0)
HCT: 39.2 % (ref 36.0–46.0)
Hemoglobin: 12.8 g/dL (ref 12.0–15.0)
LYMPHS ABS: 2.7 10*3/uL (ref 0.7–4.0)
Lymphocytes Relative: 35.8 % (ref 12.0–46.0)
MCHC: 32.5 g/dL (ref 30.0–36.0)
MCV: 88.1 fl (ref 78.0–100.0)
MONO ABS: 0.5 10*3/uL (ref 0.1–1.0)
MONOS PCT: 6.6 % (ref 3.0–12.0)
NEUTROS ABS: 4.2 10*3/uL (ref 1.4–7.7)
NEUTROS PCT: 56.2 % (ref 43.0–77.0)
PLATELETS: 506 10*3/uL — AB (ref 150.0–400.0)
RBC: 4.45 Mil/uL (ref 3.87–5.11)
RDW: 13.9 % (ref 11.5–15.5)
WBC: 7.4 10*3/uL (ref 4.0–10.5)

## 2015-03-26 LAB — LIPID PANEL
CHOLESTEROL: 177 mg/dL (ref 0–200)
HDL: 58.5 mg/dL (ref >39.00)
LDL Cholesterol: 108 mg/dL — ABNORMAL HIGH (ref 0–99)
NonHDL: 118.74
TRIGLYCERIDES: 53 mg/dL (ref 0.0–149.0)
Total CHOL/HDL Ratio: 3
VLDL: 10.6 mg/dL (ref 0.0–40.0)

## 2015-03-26 LAB — TSH: TSH: 2.38 u[IU]/mL (ref 0.35–4.50)

## 2015-03-26 NOTE — Progress Notes (Signed)
Patient ID: [name], female    DOB: 11-09-1965  Age: 49 y.o. MRN: [id number]    Subjective:  Subjective HPI [name] presents for f/u bp and thyroid.  No complaints.   Review of Systems  Constitutional: Positive for fatigue. Negative for activity change, appetite change and unexpected weight change.  Respiratory: Negative for cough and shortness of breath.   Cardiovascular: Negative for chest pain and palpitations.  Psychiatric/Behavioral: Negative for behavioral problems and dysphoric mood. The patient is not nervous/anxious.     History Past Medical History  Diagnosis Date  . Hypertension   . GERD (gastroesophageal reflux disease)   . IBS (irritable bowel syndrome)   . Diastolic dysfunction   . Obstructive sleep apnea   . Goiter   . Diverticulosis of colon   . Anemia, iron deficiency   . Thrombocytosis ([place])   . Morbid obesity ([place])   . Hiatal hernia   . Allergic rhinitis, cause unspecified   . Undiagnosed cardiac murmurs   . COPD (chronic obstructive pulmonary disease) ([place])     She has past surgical history that includes Lipoma excision; Endometrial ablation (12/08); Bariatric Surgery (06/17/2010); Spine surgery (02/10/2012); Laparoscopic assisted vaginal hysterectomy (N/A, 06/22/2014); Bilateral salpingectomy (Bilateral, 06/22/2014); and Cystoscopy (N/A, 06/22/2014).   Her family history includes Arrhythmia in her mother; Breast cancer in her mother; Cancer in her father; Cancer (age of onset: 67) in her maternal aunt; Cancer (age of onset: 91) in her mother; Colon polyps in her father; Heart disease in her mother; Hypertension in her father; Kidney cancer in her father; Lymphoma in her father. There is no history of Colon cancer.She reports that she has never smoked. She has never used smokeless tobacco. She reports that she does not drink alcohol or use illicit drugs.  Current Outpatient Prescriptions on File Prior to Visit  Medication Sig Dispense Refill  . aspirin  81 MG tablet Take 162 mg by mouth daily.    Marland Kitchen BLACK COHOSH PO Take 1 capsule by mouth daily.    . Cyanocobalamin (VITAMIN B-12 PO) Take 1 tablet by mouth daily.    . hydrochlorothiazide (HYDRODIURIL) 25 MG tablet TAKE 1 TABLET BY MOUTH DAILY 30 tablet 5  . ibuprofen (ADVIL,MOTRIN) 600 MG tablet 1  po  pc  every 6 hours x 5 days then as needed for pain 30 tablet 1  . ipratropium (ATROVENT) 0.03 % nasal spray     . levothyroxine (SYNTHROID, LEVOTHROID) 88 MCG tablet TAKE 1 TABLET (88 MCG TOTAL) BY MOUTH DAILY. 30 tablet 11  . loratadine (CLARITIN) 10 MG tablet Take 10 mg by mouth daily.    Marland Kitchen losartan (COZAAR) 100 MG tablet Take 1 tablet (100 mg total) by mouth daily. 90 tablet 3  . milk thistle 175 MG tablet Take 240 mg by mouth daily.    . potassium chloride SA (K-DUR,KLOR-CON) 20 MEQ tablet Take 1 tablet (20 mEq total) by mouth daily. 30 tablet 5  . Vitamin D, Ergocalciferol, (DRISDOL) 50000 UNITS CAPS capsule TAKE 1 CAPSULE BY MOUTH EVERY 7 DAYS 4 capsule 2  . vitamin E 200 UNIT capsule Take 200 Units by mouth daily.     No current facility-administered medications on file prior to visit.     Objective:  Objective Physical Exam  Constitutional: She is oriented to person, place, and time. She appears well-developed and well-nourished.  HENT:  Head: Normocephalic and atraumatic.  Eyes: Conjunctivae and EOM are normal.  Neck: Normal range of motion. Neck supple. No JVD  present. Carotid bruit is not present. No thyromegaly present.  Cardiovascular: Normal rate, regular rhythm and normal heart sounds.   No murmur heard. Pulmonary/Chest: Effort normal and breath sounds normal. No respiratory distress. She has no wheezes. She has no rales. She exhibits no tenderness.  Musculoskeletal: She exhibits no edema.  Neurological: She is alert and oriented to person, place, and time.  Psychiatric: She has a normal mood and affect. Her behavior is normal. Judgment and thought content normal.  Nursing  note and vitals reviewed.  BP 126/78 mmHg  Pulse 58  Temp(Src) 97.9 F (36.6 C) (Oral)  Ht $R'5\' 3"'ye$  (1.6 m)  Wt 233 lb 6.4 oz (105.87 kg)  BMI 41.36 kg/m2  SpO2 98%  LMP 01/20/2011 Wt Readings from Last 3 Encounters:  03/26/15 233 lb 6.4 oz (105.87 kg)  11/10/14 234 lb (106.142 kg)  11/02/14 237 lb 12.8 oz (107.865 kg)     Lab Results  Component Value Date   WBC 6.5 11/10/2014   HGB 12.6 11/10/2014   HCT 37.3 11/10/2014   PLT 451* 11/10/2014   GLUCOSE 111* 11/10/2014   CHOL 179 07/28/2014   TRIG 91.0 07/28/2014   HDL 61.20 07/28/2014   LDLCALC 100* 07/28/2014   ALT 14 11/10/2014   AST 19 11/10/2014   NA 139 11/10/2014   K 3.4* 11/10/2014   CL 101 11/10/2014   CREATININE 0.62 11/10/2014   BUN 17 11/10/2014   CO2 29 11/10/2014   TSH 1.318 07/28/2014   HGBA1C 5.8 07/28/2014    Ct Soft Tissue Neck W Contrast  11/10/2014  CLINICAL DATA:  Mass lateral to the left jugular vein. EXAM: CT NECK WITH CONTRAST TECHNIQUE: Multidetector CT imaging of the neck was performed using the standard protocol following the bolus administration of intravenous contrast. CONTRAST:  92mL OMNIPAQUE IOHEXOL 300 MG/ML  SOLN COMPARISON:  Ultrasound 08/10/2014. Ultrasound 12/03/2007. CT 08/15/2014. FINDINGS: Pharynx and larynx: No mucosal or submucosal lesion. Salivary glands: Parotid and submandibular glands are normal. Thyroid: No thyroid abnormality by CT. Lymph nodes: No enlarged or low-density nodes. No change since the previous study. Normal lymph nodes bilaterally. Vascular: Arterial and venous structures patent and normal. Limited intracranial: Normal Visualized orbits: Normal Mastoids and visualized paranasal sinuses: Clear Skeleton: Ordinary mild spondylosis. Upper chest: Normal IMPRESSION: No change since the study of 08/15/2014. No enlarged or abnormal lymph nodes. Normal appearing lymph nodes bilaterally. Electronically Signed   By: Nelson Chimes M.D.   On: 11/10/2014 10:41     Assessment &  Plan:  Plan I am having [name] maintain her loratadine, Cyanocobalamin (VITAMIN B-12 PO), BLACK COHOSH PO, aspirin, ibuprofen, potassium chloride SA, vitamin E, milk thistle, ipratropium, hydrochlorothiazide, levothyroxine, Vitamin D (Ergocalciferol), and losartan.  No orders of the defined types were placed in this encounter.    Problem List Items Addressed This Visit    Hypothyroidism    con't synthroid and check tsh      Relevant Orders   TSH   Essential hypertension - Primary    con't losartan stable      Relevant Orders   Comp Met (CMET)   CBC with Differential/Platelet   Lipid panel      Follow-up: Return in about 6 months (around 09/23/2015), or if symptoms worsen or fail to improve, for annual exam, fasting.  Garnet Koyanagi, DO

## 2015-03-26 NOTE — Patient Instructions (Signed)

## 2015-03-26 NOTE — Progress Notes (Signed)
Pre visit review using our clinic review tool, if applicable. No additional management support is needed unless otherwise documented below in the visit note. 

## 2015-03-26 NOTE — Assessment & Plan Note (Signed)
con't synthroid and check tsh

## 2015-03-26 NOTE — Assessment & Plan Note (Signed)
con't losartan stable

## 2015-03-27 ENCOUNTER — Other Ambulatory Visit: Payer: Self-pay

## 2015-03-27 MED ORDER — VITAMIN D (ERGOCALCIFEROL) 1.25 MG (50000 UNIT) PO CAPS: ORAL_CAPSULE | ORAL | Status: DC

## 2015-03-28 ENCOUNTER — Other Ambulatory Visit: Payer: Self-pay

## 2015-03-28 DIAGNOSIS — R748 Abnormal levels of other serum enzymes: Secondary | ICD-10-CM

## 2015-03-28 DIAGNOSIS — E785 Hyperlipidemia, unspecified: Secondary | ICD-10-CM

## 2015-03-28 DIAGNOSIS — E559 Vitamin D deficiency, unspecified: Secondary | ICD-10-CM

## 2015-04-12 ENCOUNTER — Telehealth: Payer: Self-pay | Admitting: Family Medicine

## 2015-04-12 NOTE — Telephone Encounter (Signed)
Relation to [id number] Call back [phone]    Reason for call:  Patient states symptoms have not improved from last OV on 03/26/2015 in need of clinical advice

## 2015-04-12 NOTE — Telephone Encounter (Signed)
Agree with appointment

## 2015-04-12 NOTE — Telephone Encounter (Signed)
Called patient. States her stomach pain continues daily, Stomach is sometimes swollen. Urine is dark at times with odor. Patient states she has urinary urgency. Fatigue continues. Appointment scheduled for 04/13/15 with Daun Peacock. Dr, Etter Sjogren has no openings.

## 2015-04-13 ENCOUNTER — Encounter: Payer: Self-pay | Admitting: Physician Assistant

## 2015-04-13 ENCOUNTER — Ambulatory Visit (INDEPENDENT_AMBULATORY_CARE_PROVIDER_SITE_OTHER): Payer: 59 | Admitting: Physician Assistant

## 2015-04-13 ENCOUNTER — Other Ambulatory Visit (HOSPITAL_COMMUNITY)
Admission: RE | Admit: 2015-04-13 | Discharge: 2015-04-13 | Disposition: A | Discharge status: Home / Self Care | Payer: 59 | Source: Ambulatory Visit | Attending: Physician Assistant | Admitting: Physician Assistant

## 2015-04-13 VITALS — BP 126/78 | HR 65 | Temp 97.8°F | Resp 16 | Ht 63.0 in | Wt 232.0 lb

## 2015-04-13 DIAGNOSIS — R350 Frequency of micturition: Secondary | ICD-10-CM

## 2015-04-13 DIAGNOSIS — G8929 Other chronic pain: Secondary | ICD-10-CM

## 2015-04-13 DIAGNOSIS — R109 Unspecified abdominal pain: Secondary | ICD-10-CM | POA: Diagnosis not present

## 2015-04-13 DIAGNOSIS — R1030 Lower abdominal pain, unspecified: Secondary | ICD-10-CM | POA: Diagnosis not present

## 2015-04-13 DIAGNOSIS — N760 Acute vaginitis: Secondary | ICD-10-CM | POA: Diagnosis not present

## 2015-04-13 DIAGNOSIS — R1013 Epigastric pain: Secondary | ICD-10-CM | POA: Diagnosis not present

## 2015-04-13 LAB — COMPREHENSIVE METABOLIC PANEL
ALBUMIN: 4.1 g/dL (ref 3.5–5.2)
ALK PHOS: 137 U/L — AB (ref 39–117)
ALT: 16 U/L (ref 0–35)
AST: 19 U/L (ref 0–37)
BILIRUBIN TOTAL: 0.3 mg/dL (ref 0.2–1.2)
BUN: 11 mg/dL (ref 6–23)
CHLORIDE: 100 meq/L (ref 96–112)
CO2: 33 mEq/L — ABNORMAL HIGH (ref 19–32)
CREATININE: 0.65 mg/dL (ref 0.40–1.20)
Calcium: 9.4 mg/dL (ref 8.4–10.5)
GFR: 124.11 mL/min (ref >60.00)
Glucose, Bld: 92 mg/dL (ref 70–99)
Potassium: 4.4 mEq/L (ref 3.5–5.1)
Sodium: 139 mEq/L (ref 135–145)
TOTAL PROTEIN: 7.5 g/dL (ref 6.0–8.3)

## 2015-04-13 LAB — CBC
HCT: 39.2 % (ref 36.0–46.0)
Hemoglobin: 13.1 g/dL (ref 12.0–15.0)
MCHC: 33.5 g/dL (ref 30.0–36.0)
MCV: 86.7 fl (ref 78.0–100.0)
PLATELETS: 548 10*3/uL — AB (ref 150.0–400.0)
RBC: 4.52 Mil/uL (ref 3.87–5.11)
RDW: 14.2 % (ref 11.5–15.5)
WBC: 8.9 10*3/uL (ref 4.0–10.5)

## 2015-04-13 LAB — H. PYLORI ANTIBODY, IGG: H PYLORI IGG: NEGATIVE

## 2015-04-13 LAB — POCT URINALYSIS DIPSTICK
Bilirubin, UA: NEGATIVE
Blood, UA: NEGATIVE
Glucose, UA: NEGATIVE
KETONES UA: NEGATIVE
Leukocytes, UA: NEGATIVE
Nitrite, UA: NEGATIVE
PH UA: 6
PROTEIN UA: NEGATIVE
SPEC GRAV UA: 1.015
Urobilinogen, UA: 0.2

## 2015-04-13 LAB — LIPASE: LIPASE: 56 U/L (ref 11.0–59.0)

## 2015-04-13 MED ORDER — HYOSCYAMINE SULFATE 0.125 MG PO TABS: 0.1250 mg | ORAL_TABLET | ORAL | Status: DC | PRN

## 2015-04-13 NOTE — Progress Notes (Signed)
Patient presents to clinic today c/o 2 months of intermittent epigastric discomfort associated with bloating that has become more frequent over the past 2 weeks. Endorses some mild nausea that is rare. Denies emesis. Denies fever, chills, diarrhea or constipation. Denies bleeding, rectal pain.  Denies urinary urgency, frequency, hematuria. Notes foul-smelling urine. Denies change to diet or exercise regimen.   Past Medical History  Diagnosis Date  . Hypertension   . GERD (gastroesophageal reflux disease)   . IBS (irritable bowel syndrome)   . Diastolic dysfunction   . Obstructive sleep apnea   . Goiter   . Diverticulosis of colon   . Anemia, iron deficiency   . Thrombocytosis ([facility])   . Morbid obesity ([facility])   . Hiatal hernia   . Allergic rhinitis, cause unspecified   . Undiagnosed cardiac murmurs   . COPD (chronic obstructive pulmonary disease) ([facility])     Current Outpatient Prescriptions on File Prior to Visit  Medication Sig Dispense Refill  . aspirin 81 MG tablet Take 162 mg by mouth daily.    . Cyanocobalamin (VITAMIN B-12 PO) Take 1 tablet by mouth daily.    . hydrochlorothiazide (HYDRODIURIL) 25 MG tablet TAKE 1 TABLET BY MOUTH DAILY 30 tablet 5  . ibuprofen (ADVIL,MOTRIN) 600 MG tablet 1  po  pc  every 6 hours x 5 days then as needed for pain 30 tablet 1  . ipratropium (ATROVENT) 0.03 % nasal spray     . levothyroxine (SYNTHROID, LEVOTHROID) 88 MCG tablet TAKE 1 TABLET (88 MCG TOTAL) BY MOUTH DAILY. 30 tablet 11  . loratadine (CLARITIN) 10 MG tablet Take 10 mg by mouth daily as needed.     Marland Kitchen losartan (COZAAR) 100 MG tablet Take 1 tablet (100 mg total) by mouth daily. 90 tablet 3  . milk thistle 175 MG tablet Take 240 mg by mouth daily.    . potassium chloride SA (K-DUR,KLOR-CON) 20 MEQ tablet Take 1 tablet (20 mEq total) by mouth daily. 30 tablet 5  . Vitamin D, Ergocalciferol, (DRISDOL) 50000 UNITS CAPS capsule TAKE 1 CAPSULE BY MOUTH EVERY 7 DAYS 4 capsule 2  . vitamin E  200 UNIT capsule Take 200 Units by mouth daily.     No current facility-administered medications on file prior to visit.    Allergies  Allergen Reactions  . Penicillins Other (See Comments)    Causes severe, difficult to treat yeast infections    Family History  Problem Relation Age of Onset  . Breast cancer Mother   . Arrhythmia Mother   . Heart disease Mother   . Cancer Mother 69    breast  . Hypertension Father   . Kidney cancer Father   . Colon polyps Father   . Lymphoma Father     Diffused Large B-cell Lymphoma  . Cancer Father     lymphoma  . Colon cancer Neg Hx   . Cancer Maternal Aunt 31    breast cancer    Social History   Social History  . Marital Status: Married    Spouse Name: N/A  . Number of Children: 3  . 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 Smoking status: Never Smoker   . Smokeless tobacco: Never Used     Comment: Never Used Tobacco  . Alcohol Use: No  . Drug Use: No  . Sexual Activity:    Partners: Male   Other Topics Concern  .  None   Social History Narrative    Review of Systems - See HPI.  All other ROS are negative.  BP 126/78 mmHg  Pulse 65  Temp(Src) 97.8 F (36.6 C) (Oral)  Resp 16  Ht $R'5\' 3"'VX$  (1.6 m)  Wt 232 lb (105.235 kg)  BMI 41.11 kg/m2  SpO2 99%  LMP 01/20/2011  Physical Exam  Constitutional: She is oriented to person, place, and time and well-developed, well-nourished, and in no distress.  HENT:  Head: Normocephalic and atraumatic.  Eyes: Conjunctivae are normal.  Cardiovascular: Normal rate, regular rhythm, normal heart sounds and intact distal pulses.   Pulmonary/Chest: Effort normal and breath sounds normal. No respiratory distress. She has no wheezes. She has no rales. She exhibits no tenderness.  Abdominal: Soft. Bowel sounds are normal. She exhibits no distension and no mass. There is no tenderness. There is no rebound and no guarding.    Neurological: She is alert and oriented to person, place, and time.  Skin: Skin is warm and dry. No rash noted.  Psychiatric: Affect normal.  Vitals reviewed.   Recent Results (from the past 2160 hour(s))  Comp Met (CMET)     Status: Abnormal   Collection Time: 03/26/15  8:57 AM  Result Value Ref Range   Sodium 141 135 - 145 mEq/L   Potassium 3.7 3.5 - 5.1 mEq/L   Chloride 102 96 - 112 mEq/L   CO2 30 19 - 32 mEq/L   Glucose, Bld 83 70 - 99 mg/dL   BUN 13 6 - 23 mg/dL   Creatinine, Ser 0.57 0.40 - 1.20 mg/dL   Total Bilirubin 0.3 0.2 - 1.2 mg/dL   Alkaline Phosphatase 145 (H) 39 - 117 U/L   AST 17 0 - 37 U/L   ALT 13 0 - 35 U/L   Total Protein 7.3 6.0 - 8.3 g/dL   Albumin 3.9 3.5 - 5.2 g/dL   Calcium 9.3 8.4 - 10.5 mg/dL   GFR 144.45 >60.00 mL/min  CBC with Differential/Platelet     Status: Abnormal   Collection Time: 03/26/15  8:57 AM  Result Value Ref Range   WBC 7.4 4.0 - 10.5 K/uL   RBC 4.45 3.87 - 5.11 Mil/uL   Hemoglobin 12.8 12.0 - 15.0 g/dL   HCT 39.2 36.0 - 46.0 %   MCV 88.1 78.0 - 100.0 fl   MCHC 32.5 30.0 - 36.0 g/dL   RDW 13.9 11.5 - 15.5 %   Platelets 506.0 (H) 150.0 - 400.0 K/uL   Neutrophils Relative % 56.2 43.0 - 77.0 %   Lymphocytes Relative 35.8 12.0 - 46.0 %   Monocytes Relative 6.6 3.0 - 12.0 %   Eosinophils Relative 1.0 0.0 - 5.0 %   Basophils Relative 0.4 0.0 - 3.0 %   Neutro Abs 4.2 1.4 - 7.7 K/uL   Lymphs Abs 2.7 0.7 - 4.0 K/uL   Monocytes Absolute 0.5 0.1 - 1.0 K/uL   Eosinophils Absolute 0.1 0.0 - 0.7 K/uL   Basophils Absolute 0.0 0.0 - 0.1 K/uL  Lipid panel     Status: Abnormal   Collection Time: 03/26/15  8:57 AM  Result Value Ref Range   Cholesterol 177 0 - 200 mg/dL    Comment: ATP III Classification       Desirable:  < 200 mg/dL               Borderline High:  200 - 239 mg/dL          High:  > =  240 mg/dL   Triglycerides 53.0 0.0 - 149.0 mg/dL    Comment: Normal:  <150 mg/dLBorderline High:  150 - 199 mg/dL   HDL 58.50 >39.00 mg/dL    VLDL 10.6 0.0 - 40.0 mg/dL   LDL Cholesterol 108 (H) 0 - 99 mg/dL   Total CHOL/HDL Ratio 3     Comment:                Men          Women1/2 Average Risk     3.4          3.3Average Risk          5.0          4.42X Average Risk          9.6          7.13X Average Risk          15.0          11.0                       NonHDL 118.74     Comment: NOTE:  Non-HDL goal should be 30 mg/dL higher than patient's LDL goal (i.e. LDL goal of < 70 mg/dL, would have non-HDL goal of < 100 mg/dL)  TSH     Status: None   Collection Time: 03/26/15  8:57 AM  Result Value Ref Range   TSH 2.38 0.35 - 4.50 uIU/mL  CBC     Status: Abnormal   Collection Time: 04/13/15 11:50 AM  Result Value Ref Range   WBC 8.9 4.0 - 10.5 K/uL   RBC 4.52 3.87 - 5.11 Mil/uL   Platelets 548.0 (H) 150.0 - 400.0 K/uL   Hemoglobin 13.1 12.0 - 15.0 g/dL   HCT 39.2 36.0 - 46.0 %   MCV 86.7 78.0 - 100.0 fl   MCHC 33.5 30.0 - 36.0 g/dL   RDW 14.2 11.5 - 15.5 %  Comp Met (CMET)     Status: Abnormal   Collection Time: 04/13/15 11:50 AM  Result Value Ref Range   Sodium 139 135 - 145 mEq/L   Potassium 4.4 3.5 - 5.1 mEq/L   Chloride 100 96 - 112 mEq/L   CO2 33 (H) 19 - 32 mEq/L   Glucose, Bld 92 70 - 99 mg/dL   BUN 11 6 - 23 mg/dL   Creatinine, Ser 0.65 0.40 - 1.20 mg/dL   Total Bilirubin 0.3 0.2 - 1.2 mg/dL   Alkaline Phosphatase 137 (H) 39 - 117 U/L   AST 19 0 - 37 U/L   ALT 16 0 - 35 U/L   Total Protein 7.5 6.0 - 8.3 g/dL   Albumin 4.1 3.5 - 5.2 g/dL   Calcium 9.4 8.4 - 10.5 mg/dL   GFR 124.11 >60.00 mL/min  Lipase     Status: None   Collection Time: 04/13/15 11:50 AM  Result Value Ref Range   Lipase 56.0 11.0 - 59.0 U/L  H. pylori antibody, IgG     Status: None   Collection Time: 04/13/15 11:50 AM  Result Value Ref Range   H Pylori IgG Negative Negative  POCT urinalysis dipstick     Status: None   Collection Time: 04/13/15 12:18 PM  Result Value Ref Range   Color, UA yellow    Clarity, UA clear    Glucose, UA neg     Bilirubin, UA neg    Ketones, UA neg    Spec  Grav, UA 1.015    Blood, UA neg    pH, UA 6.0    Protein, UA neg    Urobilinogen, UA 0.2    Nitrite, UA neg    Leukocytes, UA Negative Negative    Assessment/Plan: Abdominal pain, chronic, epigastric Urine dip unremarkable. Exam without reproducible pain. Will check CBC, repeat CMP, H. Pylori and Lipase. Rx Levsin. Probiotic recommended. Will obtain imaging and refer to GI based on results.

## 2015-04-13 NOTE — Patient Instructions (Signed)
Please go to the lab for blood work.  Stay well hydrated. Start a probiotic daily (I recommend Align).  Follow a bland diet to prevent stomach upset.  Take the hyoscyamine as directed for abdominal cramping.  We will proceed with imaging based on lab results.  I will call you with results if I get them before the office closes. Otherwise i will send them to your MyChart with instructions on how we will proceed.

## 2015-04-13 NOTE — Assessment & Plan Note (Signed)
Urine dip unremarkable. Exam without reproducible pain. Will check CBC, repeat CMP, H. Pylori and Lipase. Rx Levsin. Probiotic recommended. Will obtain imaging and refer to GI based on results.

## 2015-04-13 NOTE — Progress Notes (Signed)
Pre visit review using our clinic review tool, if applicable. No additional management support is needed unless otherwise documented below in the visit note/SLS  

## 2015-04-14 ENCOUNTER — Other Ambulatory Visit: Payer: Self-pay | Admitting: Physician Assistant

## 2015-04-14 DIAGNOSIS — G8929 Other chronic pain: Secondary | ICD-10-CM

## 2015-04-14 DIAGNOSIS — R1011 Right upper quadrant pain: Secondary | ICD-10-CM

## 2015-04-14 DIAGNOSIS — R1013 Epigastric pain: Secondary | ICD-10-CM

## 2015-04-14 DIAGNOSIS — R748 Abnormal levels of other serum enzymes: Secondary | ICD-10-CM

## 2015-04-16 ENCOUNTER — Ambulatory Visit (HOSPITAL_BASED_OUTPATIENT_CLINIC_OR_DEPARTMENT_OTHER)
Admission: RE | Admit: 2015-04-16 | Discharge: 2015-04-16 | Disposition: A | Discharge status: Home / Self Care | Payer: 59 | Source: Ambulatory Visit | Attending: Physician Assistant | Admitting: Physician Assistant

## 2015-04-16 DIAGNOSIS — R1013 Epigastric pain: Secondary | ICD-10-CM | POA: Insufficient documentation

## 2015-04-16 DIAGNOSIS — R748 Abnormal levels of other serum enzymes: Secondary | ICD-10-CM | POA: Diagnosis present

## 2015-04-16 DIAGNOSIS — G8929 Other chronic pain: Secondary | ICD-10-CM

## 2015-04-16 DIAGNOSIS — R1011 Right upper quadrant pain: Secondary | ICD-10-CM | POA: Diagnosis not present

## 2015-04-16 LAB — URINE CYTOLOGY ANCILLARY ONLY
BACTERIAL VAGINITIS: NEGATIVE
Candida vaginitis: NEGATIVE

## 2015-04-17 ENCOUNTER — Encounter: Payer: Self-pay | Admitting: Physician Assistant

## 2015-04-27 ENCOUNTER — Other Ambulatory Visit: Payer: Self-pay | Admitting: *Deleted

## 2015-04-27 DIAGNOSIS — D508 Other iron deficiency anemias: Secondary | ICD-10-CM

## 2015-04-30 ENCOUNTER — Other Ambulatory Visit (HOSPITAL_BASED_OUTPATIENT_CLINIC_OR_DEPARTMENT_OTHER): Payer: 59

## 2015-04-30 ENCOUNTER — Ambulatory Visit (HOSPITAL_BASED_OUTPATIENT_CLINIC_OR_DEPARTMENT_OTHER): Payer: 59 | Admitting: Family

## 2015-04-30 ENCOUNTER — Encounter: Payer: Self-pay | Admitting: Family

## 2015-04-30 VITALS — BP 117/71 | HR 62 | Temp 97.7°F | Resp 18 | Ht 63.0 in | Wt 220.0 lb

## 2015-04-30 DIAGNOSIS — Z807 Family history of other malignant neoplasms of lymphoid, hematopoietic and related tissues: Secondary | ICD-10-CM

## 2015-04-30 DIAGNOSIS — R599 Enlarged lymph nodes, unspecified: Secondary | ICD-10-CM

## 2015-04-30 DIAGNOSIS — R140 Abdominal distension (gaseous): Secondary | ICD-10-CM | POA: Diagnosis not present

## 2015-04-30 DIAGNOSIS — R634 Abnormal weight loss: Secondary | ICD-10-CM

## 2015-04-30 DIAGNOSIS — R590 Localized enlarged lymph nodes: Secondary | ICD-10-CM

## 2015-04-30 DIAGNOSIS — D508 Other iron deficiency anemias: Secondary | ICD-10-CM | POA: Diagnosis not present

## 2015-04-30 LAB — COMPREHENSIVE METABOLIC PANEL (CC13)
ALBUMIN: 4.1 g/dL (ref 3.6–5.1)
ALT: 16 U/L (ref 6–29)
AST: 21 U/L (ref 10–35)
Alkaline Phosphatase: 130 U/L — ABNORMAL HIGH (ref 33–115)
BUN: 11 mg/dL (ref 7–25)
CALCIUM: 9.2 mg/dL (ref 8.6–10.2)
CHLORIDE: 99 mmol/L (ref 98–110)
CO2: 31 mmol/L (ref 20–31)
CREATININE: 0.66 mg/dL (ref 0.50–1.10)
GLUCOSE: 82 mg/dL (ref 65–99)
Potassium: 3.8 mmol/L (ref 3.5–5.3)
SODIUM: 139 mmol/L (ref 135–146)
Total Bilirubin: 0.4 mg/dL (ref 0.2–1.2)
Total Protein: 6.9 g/dL (ref 6.1–8.1)

## 2015-04-30 LAB — CBC WITH DIFFERENTIAL (CANCER CENTER ONLY)
BASO#: 0 10*3/uL (ref 0.0–0.2)
BASO%: 0.3 % (ref 0.0–2.0)
EOS%: 0.8 % (ref 0.0–7.0)
Eosinophils Absolute: 0.1 10*3/uL (ref 0.0–0.5)
HEMATOCRIT: 38.2 % (ref 34.8–46.6)
HGB: 12.4 g/dL (ref 11.6–15.9)
LYMPH#: 3.4 10*3/uL — AB (ref 0.9–3.3)
LYMPH%: 38.3 % (ref 14.0–48.0)
MCH: 28.7 pg (ref 26.0–34.0)
MCHC: 32.5 g/dL (ref 32.0–36.0)
MCV: 88 fL (ref 81–101)
MONO#: 0.7 10*3/uL (ref 0.1–0.9)
MONO%: 8 % (ref 0.0–13.0)
NEUT#: 4.7 10*3/uL (ref 1.5–6.5)
NEUT%: 52.6 % (ref 39.6–80.0)
PLATELETS: 503 10*3/uL — AB (ref 145–400)
RBC: 4.32 10*6/uL (ref 3.70–5.32)
RDW: 13.8 % (ref 11.1–15.7)
WBC: 9 10*3/uL (ref 3.9–10.0)

## 2015-04-30 LAB — CHCC SATELLITE - SMEAR

## 2015-04-30 NOTE — Progress Notes (Signed)
Hematology and Oncology Follow Up Visit  [name] [id number] 10-Mar-1966 49 y.o. 04/30/2015   Principle Diagnosis:  1. Thrombocytosis 2. Family history of lymphoma (father)  Current Therapy:  Observation    Interim History:  [name] is here today for a follow-up. She is still feeling fatigued. She has some left supraclavicular swelling. The right side appears normal. No other swelling or lymphadenopathy noted.  She has lost 12 lbs since 11/18. She states that her appetite is decreased. She has had some episodes of diarrhea and abdominal discomfort and bloating. She states that her PCP referred her to GI and she is waiting for them to call her and schedule an appointment.   She has had no fever, chills, n/v, cough, rash, dizziness, chest pain or palpitations. She states that she occasionally gets SOB with exertion.  No swelling, tenderness, numbness or tingling in her extremities.      Medications:    Medication List       This list is accurate as of: 04/30/15  9:47 PM.  Always use your most recent med list.               aspirin 81 MG tablet  Take 162 mg by mouth daily.     Echinacea 500 MG Caps  Take 500 mg by mouth 2 (two) times daily as needed.     Flax Seed Oil 1000 MG Caps  Take 1,000 mg by mouth daily.     hydrochlorothiazide 25 MG tablet  Commonly known as:  HYDRODIURIL  TAKE 1 TABLET BY MOUTH DAILY     hyoscyamine 0.125 MG tablet  Commonly known as:  LEVSIN, ANASPAZ  Take 1 tablet (0.125 mg total) by mouth every 4 (four) hours as needed.     ibuprofen 600 MG tablet  Commonly known as:  ADVIL,MOTRIN  1  po  pc  every 6 hours x 5 days then as needed for pain     ipratropium 0.03 % nasal spray  Commonly known as:  ATROVENT     levothyroxine 88 MCG tablet  Commonly known as:  SYNTHROID, LEVOTHROID  TAKE 1 TABLET (88 MCG TOTAL) BY MOUTH DAILY.     loratadine 10 MG tablet  Commonly known as:  CLARITIN  Take 10 mg by mouth daily as needed.     losartan 100 MG tablet  Commonly known as:  COZAAR  Take 1 tablet (100 mg total) by mouth daily.     milk thistle 175 MG tablet  Take 240 mg by mouth daily.     potassium chloride SA 20 MEQ tablet  Commonly known as:  K-DUR,KLOR-CON  Take 1 tablet (20 mEq total) by mouth daily.     VITAMIN B-12 PO  Take 1 tablet by mouth daily.     Vitamin D (Ergocalciferol) 50000 UNITS Caps capsule  Commonly known as:  DRISDOL  TAKE 1 CAPSULE BY MOUTH EVERY 7 DAYS     vitamin E 200 UNIT capsule  Take 200 Units by mouth daily.        Allergies:  Allergies  Allergen Reactions  . Penicillins Other (See Comments)    Causes severe, difficult to treat yeast infections    Past Medical History, Surgical history, Social history, and Family History were reviewed and updated.  Review of Systems: All other 10 point review of systems is negative.   Physical Exam:  height is _0  (1.6 m) and weight is 220 lb (99.791 kg). Her oral temperature is 97.7 F (  36.5 C). Her blood pressure is 117/71 and her pulse is 62. Her respiration is 18.   Wt Readings from Last 3 Encounters:  04/30/15 220 lb (99.791 kg)  04/13/15 232 lb (105.235 kg)  03/26/15 233 lb 6.4 oz (105.87 kg)    Ocular: Sclerae unicteric, pupils equal, round and reactive to light Ear-nose-throat: Oropharynx clear, dentition fair Lymphatic: No cervical or axillary adenopathy Lungs no rales or rhonchi, good excursion bilaterally Heart regular rate and rhythm, no murmur appreciated Abd soft, nontender, positive bowel sounds MSK no focal spinal tenderness, no joint edema Neuro: non-focal, well-oriented, appropriate affect Breasts: Deferred  Lab Results  Component Value Date   WBC 9.0 04/30/2015   HGB 12.4 04/30/2015   HCT 38.2 04/30/2015   MCV 88 04/30/2015   PLT 503* 04/30/2015   Lab Results  Component Value Date   FERRITIN 34 11/02/2014   IRON 61 11/02/2014   TIBC 283 11/02/2014   UIBC 222 11/02/2014   IRONPCTSAT 21  11/02/2014   Lab Results  Component Value Date   RETICCTPCT 1.8 04/19/2014   RBC 4.32 04/30/2015   RETICCTABS 79.2 04/19/2014   No results found for: KPAFRELGTCHN, LAMBDASER, KAPLAMBRATIO No results found for: IGGSERUM, IGA, IGMSERUM No results found for: Odetta Pink, SPEI   Chemistry      Component Value Date/Time   NA 139 04/30/2015 1503   K 3.8 04/30/2015 1503   CL 99 04/30/2015 1503   CO2 31 04/30/2015 1503   BUN 11 04/30/2015 1503   CREATININE 0.66 04/30/2015 1503   CREATININE 0.72 01/10/2011 1545      Component Value Date/Time   CALCIUM 9.2 04/30/2015 1503   ALKPHOS 130* 04/30/2015 1503   AST 21 04/30/2015 1503   ALT 16 04/30/2015 1503   BILITOT 0.4 04/30/2015 1503     Impression and Plan: [name] is a very pleasant 49 yo African American female with a history of thrombocytosis. Her platelet count at this time is 503 and no anemia. The rest of her CBC and CMP were unremarkable. Her alk/phos continues to improve.  She does have some left supraclavicular swelling. The area is not painful. She has lost 12 lbs in 3 weeks and has a decreased appetite. She has had a few episodes of diarrhea and is having some abdominal discomfort and bloating.  She states that she was referred to GI by her PCP and is waiting to schedule her appointment.  We will go ahead and schedule her for CTs of the neck, chest, abdomen and pelvis. We will plan to see her back in 1 month for labs and follow-up.  She will contact us with any questions or concerns and we will schedule an appointment at that time.    Eliezer Bottom, NP 12/5/20169:47 PM

## 2015-05-01 ENCOUNTER — Telehealth: Payer: Self-pay | Admitting: Family Medicine

## 2015-05-01 DIAGNOSIS — R1013 Epigastric pain: Principal | ICD-10-CM

## 2015-05-01 DIAGNOSIS — G8929 Other chronic pain: Secondary | ICD-10-CM

## 2015-05-01 LAB — IRON AND TIBC
%SAT: 31 % (ref 21–57)
IRON: 93 ug/dL (ref 41–142)
TIBC: 296 ug/dL (ref 236–444)
UIBC: 204 ug/dL (ref 120–384)

## 2015-05-01 LAB — LACTATE DEHYDROGENASE: LDH: 219 U/L (ref 125–245)

## 2015-05-01 LAB — FERRITIN: Ferritin: 43 ng/ml (ref 9–269)

## 2015-05-01 NOTE — Telephone Encounter (Signed)
Cody- please see below. I will defer to you since you evaluated the patient. Thanks.

## 2015-05-01 NOTE — Telephone Encounter (Signed)
Please see 04/13/15 OV and patient email and advise re: referral below?

## 2015-05-01 NOTE — Telephone Encounter (Signed)
Relation to [id number] Call back [phone]   Reason for call:  Patient was last seen 04/13/15 with PA, patient inquiring about gastro referral. Please advise

## 2015-05-01 NOTE — Telephone Encounter (Signed)
Pt aware/awaiting urgent appointment with [facility]

## 2015-05-01 NOTE — Telephone Encounter (Signed)
Referral re-entered and placed as Urgent. Delsa Sale will you call patient to let her know referral replaced and made urgent.

## 2015-05-03 ENCOUNTER — Telehealth: Payer: Self-pay | Admitting: Family

## 2015-05-03 ENCOUNTER — Ambulatory Visit (INDEPENDENT_AMBULATORY_CARE_PROVIDER_SITE_OTHER): Payer: 59 | Admitting: Internal Medicine

## 2015-05-03 ENCOUNTER — Encounter (HOSPITAL_BASED_OUTPATIENT_CLINIC_OR_DEPARTMENT_OTHER): Payer: Self-pay

## 2015-05-03 ENCOUNTER — Encounter: Payer: Self-pay | Admitting: Internal Medicine

## 2015-05-03 ENCOUNTER — Ambulatory Visit (HOSPITAL_BASED_OUTPATIENT_CLINIC_OR_DEPARTMENT_OTHER)
Admission: RE | Admit: 2015-05-03 | Discharge: 2015-05-03 | Disposition: A | Discharge status: Home / Self Care | Payer: 59 | Source: Ambulatory Visit | Attending: Family | Admitting: Family

## 2015-05-03 ENCOUNTER — Other Ambulatory Visit (INDEPENDENT_AMBULATORY_CARE_PROVIDER_SITE_OTHER): Payer: 59

## 2015-05-03 VITALS — BP 124/70 | HR 72 | Ht 63.0 in | Wt 232.8 lb

## 2015-05-03 DIAGNOSIS — R599 Enlarged lymph nodes, unspecified: Secondary | ICD-10-CM | POA: Insufficient documentation

## 2015-05-03 DIAGNOSIS — R590 Localized enlarged lymph nodes: Secondary | ICD-10-CM

## 2015-05-03 DIAGNOSIS — R748 Abnormal levels of other serum enzymes: Secondary | ICD-10-CM | POA: Diagnosis not present

## 2015-05-03 DIAGNOSIS — R1013 Epigastric pain: Secondary | ICD-10-CM

## 2015-05-03 DIAGNOSIS — R634 Abnormal weight loss: Secondary | ICD-10-CM | POA: Diagnosis not present

## 2015-05-03 DIAGNOSIS — R7989 Other specified abnormal findings of blood chemistry: Secondary | ICD-10-CM | POA: Diagnosis present

## 2015-05-03 DIAGNOSIS — K5730 Diverticulosis of large intestine without perforation or abscess without bleeding: Secondary | ICD-10-CM | POA: Diagnosis not present

## 2015-05-03 LAB — GAMMA GT: GGT: 29 U/L (ref 7–51)

## 2015-05-03 MED ORDER — IOHEXOL 300 MG/ML  SOLN
100.0000 mL | Freq: Once | INTRAMUSCULAR | Status: AC | PRN
  Administered 2015-05-03: 100 mL via INTRAVENOUS

## 2015-05-03 NOTE — Patient Instructions (Signed)
Your physician has requested that you go to the basement for  lab work before leaving today.   

## 2015-05-03 NOTE — Telephone Encounter (Signed)
Called and left message on patient's personal phone that scans were negative. Call back number was left in case of questions.

## 2015-05-03 NOTE — Progress Notes (Signed)
HISTORY OF PRESENT ILLNESS:  [name] is a 49 y.o. female who is sent today by her primary care provider Mr. Hassell Done with chief complaint of elevated alkaline phosphatase and intermittent abdominal discomfort. The patient has not been seen in this office since 2012. She is status post Roux-en-Y gastric bypass surgery for morbid obesity at [facility] in early 2012. Her weight loss has been modest with current BMI 41. Reviewing the patient's outside blood work. She has had intermittent mild elevation of alkaline phosphatase. Most recent value 145. This has not been fractionated. She denies a family or personal history of liver disease. She did have an abdominal ultrasound 04/16/2015. The liver was normal. As well, normal biliary ductal system. Normal gallbladder. Next, she has been complaining of some upper abdominal discomfort intermittently. Describes it as menstrual cramps. Was prescribed sublingual Levsin which she states helps. Discomfort have been present for several months but seems to be improving. She also reports fullness or bulging sensation of her abdomen. She did undergo CT scan of the abdomen earlier today. I have reviewed that report. No abnormalities. In particular, normal liver. No issues related to prior surgery. Patient denies effluxed symptoms or dysphagia. She did have some issues with dysphagia for which she underwent upper endoscopy with Dr. Sharlett Iles October 2012. She was found to have normal postoperative anatomy. Maloney dilation to 84 [place] performed. This helped. Patient has undergone routine colonoscopy as well in September 2008. Diverticulosis only. She was being evaluated for anemia at that time. This was felt secondary to menstrual blood loss. GI review of systems is otherwise negative  REVIEW OF SYSTEMS:  All non-GI ROS negative except for sinus and allergy trouble, back pain, fatigue  Past Medical History  Diagnosis Date  . Hypertension   . GERD  (gastroesophageal reflux disease)   . IBS (irritable bowel syndrome)   . Diastolic dysfunction   . Obstructive sleep apnea   . Goiter   . Diverticulosis of colon   . Anemia, iron deficiency   . Thrombocytosis ([place])   . Morbid obesity ([place])   . Hiatal hernia   . Allergic rhinitis, cause unspecified   . Undiagnosed cardiac murmurs   . COPD (chronic obstructive pulmonary disease) [facility])     Past Surgical History  Procedure Laterality Date  . Lipoma excision      upper back  . Endometrial ablation  12/08  . Bariatric surgery  06/17/2010  . Spine surgery  02/10/2012    L4-l5 fusion--cohen  . Laparoscopic assisted vaginal hysterectomy N/A 06/22/2014    Procedure: LAPAROSCOPIC ASSISTED VAGINAL HYSTERECTOMY;  Surgeon: Crawford Givens, MD;  Location: [place] [facility];  Service: Gynecology;  Laterality: N/A;  . Bilateral salpingectomy Bilateral 06/22/2014    Procedure: BILATERAL SALPINGECTOMY;  Surgeon: Crawford Givens, MD;  Location: [place] [facility];  Service: Gynecology;  Laterality: Bilateral;  . Cystoscopy N/A 06/22/2014    Procedure: CYSTOSCOPY;  Surgeon: Crawford Givens, MD;  Location: [place] [facility];  Service: Gynecology;  Laterality: N/A;    Social History Dareli Izzard  reports that she has never smoked. She has never used smokeless tobacco. She reports that she does not drink alcohol or use illicit drugs.  family history includes Arrhythmia in her mother; Breast cancer in her mother; Cancer in her father; Cancer (age of onset: 13) in her maternal aunt; Cancer (age of onset: 16) in her mother; Colon polyps in her father; Heart disease in her mother; Hypertension in her father; Kidney cancer in her father; Lymphoma in her  father. There is no history of Colon cancer.  Allergies  Allergen Reactions  . Penicillins Other (See Comments)    Causes severe, difficult to treat yeast infections       PHYSICAL EXAMINATION: Vital signs: BP 124/70 mmHg  Pulse 72  Ht 5\' 3"  (1.6 m)  Wt 232 lb 12.8 oz (105.597 kg)  BMI 41.25  kg/m2  LMP 01/20/2011  Constitutional: Pleasant, obese, but generally well-appearing, no acute distress Psychiatric: alert and oriented x3, cooperative Eyes: extraocular movements intact, anicteric, conjunctiva pink Mouth: oral pharynx moist, no lesions Neck: supple without thyromegaly Lymph: no lymphadenopathy Cardiovascular: heart regular rate and rhythm, no murmur Lungs: clear to auscultation bilaterally Abdomen: soft, obese, nontender, nondistended, no obvious ascites, no peritoneal signs, normal bowel sounds, no organomegaly. Prior surgical incisions well-healed. No hernia Rectal: Omitted Extremities: no clubbing cyanosis or lower extremity edema bilaterally Skin: no lesions on visible extremities Neuro: No focal deficits. Cranial nerves intact. No asterixis.   ASSESSMENT:  #1. Chronic mild elevation of alkaline phosphatase. Normal liver tests otherwise. Negative imaging. Clinical significance uncertain. #2. Intermittent upper abdominal pain as described. Sounds like a musculoskeletal cramping in combination with truncal obesity. No worrisome features. Negative imaging. #3. Morbid obesity despite prior Roux-en-Y gastric bypass surgery #4. Last colonoscopy 2008 with diverticulosis. No lower GI complaints  PLAN:  #1. Check AMA, fractionated alkaline phosphatase, and GGT today #2. Reassurance with regards to abdominal discomfort. Okay to use Levsin if helpful #3. Routine screening colonoscopy recommended around September 2018. Patient aware #4. Weight loss through diet and exercise. #5. Return to the care of your PCP. GI follow-up as needed  A copy of this consultation has been sent to Mr. Hassell Done

## 2015-05-07 LAB — MITOCHONDRIAL ANTIBODIES

## 2015-05-07 LAB — ALKALINE PHOSPHATASE ISOENZYMES
Alkaline Phonsphatase: 137 U/L — ABNORMAL HIGH (ref 33–115)
Bone Isoenzymes: 21 % — ABNORMAL LOW (ref 28–66)
INTESTINAL ISOENZYMES (ALP ISO): 1 % (ref 1–24)
Liver Isoenzymes: 78 % — ABNORMAL HIGH (ref 25–69)
MACROHEPATIC ISOENZYMES: 0 %

## 2015-05-31 ENCOUNTER — Ambulatory Visit: Payer: 59 | Admitting: Family

## 2015-05-31 ENCOUNTER — Other Ambulatory Visit: Payer: Self-pay

## 2015-06-18 ENCOUNTER — Ambulatory Visit (HOSPITAL_BASED_OUTPATIENT_CLINIC_OR_DEPARTMENT_OTHER): Payer: BLUE CROSS/BLUE SHIELD | Admitting: Family

## 2015-06-18 ENCOUNTER — Other Ambulatory Visit (HOSPITAL_BASED_OUTPATIENT_CLINIC_OR_DEPARTMENT_OTHER): Payer: BLUE CROSS/BLUE SHIELD

## 2015-06-18 ENCOUNTER — Encounter: Payer: Self-pay | Admitting: Family

## 2015-06-18 VITALS — BP 141/65 | HR 57 | Temp 97.7°F | Resp 16 | Ht 63.0 in | Wt 233.0 lb

## 2015-06-18 DIAGNOSIS — D473 Essential (hemorrhagic) thrombocythemia: Secondary | ICD-10-CM

## 2015-06-18 DIAGNOSIS — D75839 Thrombocytosis, unspecified: Secondary | ICD-10-CM

## 2015-06-18 DIAGNOSIS — R634 Abnormal weight loss: Secondary | ICD-10-CM

## 2015-06-18 DIAGNOSIS — G8929 Other chronic pain: Secondary | ICD-10-CM

## 2015-06-18 DIAGNOSIS — R1013 Epigastric pain: Principal | ICD-10-CM

## 2015-06-18 DIAGNOSIS — R5382 Chronic fatigue, unspecified: Secondary | ICD-10-CM | POA: Diagnosis not present

## 2015-06-18 DIAGNOSIS — R590 Localized enlarged lymph nodes: Secondary | ICD-10-CM

## 2015-06-18 LAB — CBC WITH DIFFERENTIAL (CANCER CENTER ONLY)
BASO#: 0 10*3/uL (ref 0.0–0.2)
BASO%: 0.2 % (ref 0.0–2.0)
EOS%: 1.2 % (ref 0.0–7.0)
Eosinophils Absolute: 0.1 10*3/uL (ref 0.0–0.5)
HEMATOCRIT: 39.9 % (ref 34.8–46.6)
HGB: 12.9 g/dL (ref 11.6–15.9)
LYMPH#: 3.5 10*3/uL — AB (ref 0.9–3.3)
LYMPH%: 42.9 % (ref 14.0–48.0)
MCH: 28.8 pg (ref 26.0–34.0)
MCHC: 32.3 g/dL (ref 32.0–36.0)
MCV: 89 fL (ref 81–101)
MONO#: 0.6 10*3/uL (ref 0.1–0.9)
MONO%: 6.9 % (ref 0.0–13.0)
NEUT#: 4 10*3/uL (ref 1.5–6.5)
NEUT%: 48.8 % (ref 39.6–80.0)
Platelets: 491 10*3/uL — ABNORMAL HIGH (ref 145–400)
RBC: 4.48 10*6/uL (ref 3.70–5.32)
RDW: 13.9 % (ref 11.1–15.7)
WBC: 8.2 10*3/uL (ref 3.9–10.0)

## 2015-06-18 LAB — COMPREHENSIVE METABOLIC PANEL
ALT: 20 U/L (ref 0–55)
AST: 22 U/L (ref 5–34)
Albumin: 4 g/dL (ref 3.5–5.0)
Alkaline Phosphatase: 148 U/L (ref 40–150)
Anion Gap: 9 mEq/L (ref 3–11)
BUN: 15 mg/dL (ref 7.0–26.0)
CALCIUM: 9.4 mg/dL (ref 8.4–10.4)
CHLORIDE: 103 meq/L (ref 98–109)
CO2: 29 mEq/L (ref 22–29)
Creatinine: 0.7 mg/dL (ref 0.6–1.1)
Glucose: 96 mg/dl (ref 70–140)
POTASSIUM: 4 meq/L (ref 3.5–5.1)
Sodium: 141 mEq/L (ref 136–145)
Total Bilirubin: 0.49 mg/dL (ref 0.20–1.20)
Total Protein: 7.7 g/dL (ref 6.4–8.3)

## 2015-06-18 LAB — LACTATE DEHYDROGENASE: LDH: 226 U/L (ref 125–245)

## 2015-06-18 NOTE — Progress Notes (Signed)
Hematology and Oncology Follow Up Visit  [name] [id number] 02-07-1966 50 y.o. 06/18/2015   Principle Diagnosis:  1. Thrombocytosis 2. Family history of lymphoma (father)  Current Therapy:  Observation    Interim History:  [name] is here today for a follow-up. She is still having fatigue. She states that she was at one time diagnosed with mixed connective tissue disease by Rheumatology but has not followed up with them in a few years. She plans to have her PCP refer her back to them to see if anything has changed.  Her CT scans of the neck, chest, abdomen and pelvis in December showed no evidence of malignancy.  Her LFTs are normal at this time. No anemia. Platelet count is 491.  She has had no episodes of bleeding, bruising or petechiae.  No fever, chills, n/v, cough, rash, dizziness, headache, vision changes, chest pain, palpitations or changes in bowel or bladder habits.  She has had some intermittent abdominal discomfort at times.  She has back pain due to chronic cervical disc and endplate degeneration at C6-C7. This also has caused her to have numbness and tingling in her fingertips that comes and goes.  No swelling or tenderness in her extremities at this time.  She has been walking some during the week to get exercise.  She has maintained a good appetite and is staying well hydrated. Her weight is up 13 lbs since her last visit.   Medications:    Medication List       This list is accurate as of: 06/18/15 11:07 AM.  Always use your most recent med list.               aspirin 81 MG tablet  Take 162 mg by mouth daily.     Echinacea 500 MG Caps  Take 500 mg by mouth 2 (two) times daily as needed.     Flax Seed Oil 1000 MG Caps  Take 1,000 mg by mouth daily.     hydrochlorothiazide 25 MG tablet  Commonly known as:  HYDRODIURIL  TAKE 1 TABLET BY MOUTH DAILY     hyoscyamine 0.125 MG tablet  Commonly known as:  LEVSIN, ANASPAZ  Take 1 tablet (0.125 mg  total) by mouth every 4 (four) hours as needed.     ibuprofen 600 MG tablet  Commonly known as:  ADVIL,MOTRIN  1  po  pc  every 6 hours x 5 days then as needed for pain     ipratropium 0.03 % nasal spray  Commonly known as:  ATROVENT     levothyroxine 88 MCG tablet  Commonly known as:  SYNTHROID, LEVOTHROID  TAKE 1 TABLET (88 MCG TOTAL) BY MOUTH DAILY.     loratadine 10 MG tablet  Commonly known as:  CLARITIN  Take 10 mg by mouth daily as needed.     losartan 100 MG tablet  Commonly known as:  COZAAR  Take 1 tablet (100 mg total) by mouth daily.     milk thistle 175 MG tablet  Take 240 mg by mouth daily.     potassium chloride SA 20 MEQ tablet  Commonly known as:  K-DUR,KLOR-CON  Take 1 tablet (20 mEq total) by mouth daily.     VITAMIN B-12 PO  Take 1 tablet by mouth daily.     Vitamin D (Ergocalciferol) 50000 units Caps capsule  Commonly known as:  DRISDOL  TAKE 1 CAPSULE BY MOUTH EVERY 7 DAYS     vitamin E 200 UNIT  capsule  Take 200 Units by mouth daily.        Allergies:  Allergies  Allergen Reactions  . Penicillins Other (See Comments)    Causes severe, difficult to treat yeast infections    Past Medical History, Surgical history, Social history, and Family History were reviewed and updated.  Review of Systems: All other 10 point review of systems is negative.   Physical Exam:  vitals were not taken for this visit.  Wt Readings from Last 3 Encounters:  05/03/15 232 lb 12.8 oz (105.597 kg)  04/30/15 220 lb (99.791 kg)  04/13/15 232 lb (105.235 kg)    Ocular: Sclerae unicteric, pupils equal, round and reactive to light Ear-nose-throat: Oropharynx clear, dentition fair Lymphatic: No supraclavicular cervical or axillary adenopathy Lungs no rales or rhonchi, good excursion bilaterally Heart regular rate and rhythm, no murmur appreciated Abd soft, nontender, positive bowel sounds, no liver or spleen tip palpated on exam MSK no focal spinal tenderness,  no joint edema Neuro: non-focal, well-oriented, appropriate affect Breasts: Deferred  Lab Results  Component Value Date   WBC 8.2 06/18/2015   HGB 12.9 06/18/2015   HCT 39.9 06/18/2015   MCV 89 06/18/2015   PLT 491* 06/18/2015   Lab Results  Component Value Date   FERRITIN 43 04/30/2015   IRON 93 04/30/2015   TIBC 296 04/30/2015   UIBC 204 04/30/2015   IRONPCTSAT 31 04/30/2015   Lab Results  Component Value Date   RETICCTPCT 1.8 04/19/2014   RBC 4.48 06/18/2015   RETICCTABS 79.2 04/19/2014   No results found for: KPAFRELGTCHN, LAMBDASER, KAPLAMBRATIO No results found for: IGGSERUM, IGA, IGMSERUM No results found for: Odetta Pink, SPEI   Chemistry      Component Value Date/Time   NA 139 04/30/2015 1503   K 3.8 04/30/2015 1503   CL 99 04/30/2015 1503   CO2 31 04/30/2015 1503   BUN 11 04/30/2015 1503   CREATININE 0.66 04/30/2015 1503   CREATININE 0.72 01/10/2011 1545      Component Value Date/Time   CALCIUM 9.2 04/30/2015 1503   ALKPHOS 130* 04/30/2015 1503   AST 21 04/30/2015 1503   ALT 16 04/30/2015 1503   BILITOT 0.4 04/30/2015 1503     Impression and Plan: [name] is a very pleasant 50 yo African American female with a history of thrombocytosis. She has chronic fatigue possibly due to connective tissue disease. She plans to follow-up with rheumatology soon.  Her platelet count today is 491, no anemia. No episodes of bleeding.  LFT's today are normal.  Her CT scans in December were negative for malignancy. We will repeat these scans in 4 months to assess for any changes.  We will plan to see her back in 4 months for labs and follow-up.  She will contact us with any questions or concerns and we will schedule an appointment at that time.    Eliezer Bottom, NP 1/23/201711:07 AM

## 2015-06-19 ENCOUNTER — Telehealth: Payer: Self-pay | Admitting: Family Medicine

## 2015-06-19 NOTE — Telephone Encounter (Signed)
Message left with daughter.    KP

## 2015-06-19 NOTE — Telephone Encounter (Signed)
Pt would like call back from [facility] regarding Dec lab results and future follow up. Best # [phone].

## 2015-06-20 NOTE — Telephone Encounter (Signed)
Spoke with patient and she is still having fatigue, fevers daily that comes and goes, her liver functions are still elevated, lymph nodes that comes and goes and no one can figure out what is going on. She wanted to know if she can be tested for Celiac disease, recheck her vitamin D levels as well as many additional questions and concerns. I advised since she has many concerns it would be best to come in and discuss. Apt scheduled with Dr.Lowne tomorrow at 2:45.        KP

## 2015-06-21 ENCOUNTER — Ambulatory Visit (INDEPENDENT_AMBULATORY_CARE_PROVIDER_SITE_OTHER): Payer: BLUE CROSS/BLUE SHIELD | Admitting: Family Medicine

## 2015-06-21 ENCOUNTER — Encounter: Payer: Self-pay | Admitting: Family Medicine

## 2015-06-21 VITALS — BP 112/74 | HR 59 | Temp 98.1°F | Ht 63.0 in | Wt 234.6 lb

## 2015-06-21 DIAGNOSIS — Z1159 Encounter for screening for other viral diseases: Secondary | ICD-10-CM

## 2015-06-21 DIAGNOSIS — I10 Essential (primary) hypertension: Secondary | ICD-10-CM | POA: Diagnosis not present

## 2015-06-21 DIAGNOSIS — E039 Hypothyroidism, unspecified: Secondary | ICD-10-CM | POA: Diagnosis not present

## 2015-06-21 DIAGNOSIS — R1084 Generalized abdominal pain: Secondary | ICD-10-CM

## 2015-06-21 DIAGNOSIS — Z9109 Other allergy status, other than to drugs and biological substances: Secondary | ICD-10-CM

## 2015-06-21 NOTE — Patient Instructions (Signed)
Gluten-Free Diet for Celiac Disease Gluten is a protein found in wheat, rye, barley, and triticale (a cross between wheat and rye) grains. People with celiac disease need to have a gluten-free diet. With celiac disease, gluten interferes with the absorption of food and may also cause intestinal injury.  Strict compliance is important even during symptom-free periods. This means eliminating all foods with gluten from your diet permanently. This requires some significant changes but is very manageable. WHAT DO I NEED TO KNOW ABOUT A GLUTEN-FREE DIET?  Look for items labeled with "GF." Looking for GF will make it easier to identify products that are safe to eat.  Read all labels. Gluten may have been added as a minor ingredient where least expected, such as in shredded cheeses or ice creams. Always check food labels and investigate questionable ingredients. Talk to your dietitian or health care provider if you have questions about certain foods or need help finding GF foods.  Check when in doubt. If you are not sure whether an ingredient contains gluten, check with the manufacturer. Note that some manufacturers may change ingredients without notice. Always read labels.   Know how food is prepared. Since flour and cereal products are often used in the preparation of foods, it is important to be aware of the methods of preparation used, as well as the ingredients in the foods themselves. This is especially true when you are dining out. Ask restaurants if they have a gluten-free menu.  Watch for cross-contamination. Cross-contamination occurs when gluten-free foods come into contact with foods that contain gluten. It often happens during the manufacturing process. Always check the ingredient list and for warnings on packages, such as "may contain gluten."  Eat a balanced diet. It is important to still get enough fiber, iron, and B vitamins in your diet. Look for enriched whole grain gluten-free products  and continue to eat a well-balanced diet of the important non-grain items, such as vegetables, fruit, lean proteins, legumes, and dairy.  Consider taking a gluten-free multivitamin and mineral supplement. Discuss this with your health care provider. WHAT KEY WORDS HELP IDENTIFY GLUTEN? Know key words to help identify gluten. A dietitian can help you identify possible harmful ingredients in the foods you normally eat. Words to check for on food labels include:   Flour, enriched flour, bromated flour, white flour, durum flour, graham flour, phosphated flour, self-rising flour, semolina, or farina.  Starch, dextrin, modified food starch, or cereal.  Thickening, fillers, or emulsifiers.  Any kind of malt flavoring, extract, or syrup (malt is made from barley and includes malt vinegar, malted milk, and malted beverages).  Hydrolyzed vegetable protein. WHAT FOODS CAN I EAT? Below is a list of common foods that are allowed with a gluten-free diet.  Grains Products made from the following flours or grains:amaranth,bean flours, 100% buckwheat flour, corn, millet, nut flours or meals, GF oats, quinoa, rice, sorghum, teff, any all-purpose 100% GF flour mix, rice wafers, pure cornmeal tortillas, popcorn, some crackers, some chips, and hot cereals made from cornmeal. Ask your dietitian which specific hot and cold cereals are allowed. Hominy, rice or wild rice, and special GF pasta. Some Asian rice noodles or bean noodles. Arrowroot starch, corn bran, corn flour, corn germ, cornmeal, corn starch, potato flour, potato starch flour, and rice bran. Rice flours: plain, brown, and sweet. Rice polish, soy flour, tapioca starch. Vegetables All plain, fresh, frozen, or canned vegetables.  Fruits All fresh, frozen, canned, dried fruits, and fruit juices.  Meats and Other   Protein Foods Meat, fish, poultry, or eggs prepared without added wheat, rye, barley, or triticale. Some luncheon meat and some frankfurters.  Pure meat. All aged cheese, most processed cheese products, some cottage cheese, and some cream cheese. Dried beans, dried peas, and lentils.  Dairy Milk and yogurt made with allowed ingredients.  Beverages Coffee (regular or decaffeinated), tea, herbal tea (read label to be sure that no wheat flour has been added). Carbonated beverages and some root beers. Wine, sake, and distilled spirits, such as gin, vodka, and whiskey. GF beers and GF ciders.  Sweetsand Desserts Sugar, honey, some syrups, molasses, jelly, jam, plain hard candy, marshmallows, gumdrops, homemade candies free of wheat, rye, barley, or triticale. Coconut. Custard, some pudding mixes, and homemade puddings from cornstarch, rice, and tapioca. Gelatin desserts, sorbets, frozen ice pops, and sherbet. Cake, cookies, and other desserts prepared with allowed flours. Some commercial ice creams. Ask your dietitian about specific brands of dessert that are allowed.  Fats and Oils Butter, margarine, vegetable oil, sour cream not containing modified food starch, whipping cream, shortening, lard, cream, and some mayonnaise. Some commercial salad dressings. Peanut butter.  Other Homemade broth and soups made with allowed ingredients; some canned or frozen soups. Any other combination or prepared foods that do not contain gluten. Monosodium glutamate (MSG). Cider, rice, and wine vinegar. Baking soda and baking powder. Certain soy sauces ([place]). Ask your dietitian about specific brands that are allowed. Nuts, coconut, chocolate, and pure cocoa powder. Salt, pepper, herbs, spices, extracts, and food colorings. The items listed above may not be a complete list of allowed foods or beverages. Contact your dietitian for more options.  WHAT FOODS CAN I NOT EAT? Below is a list of common foods that are not allowed with a gluten-free diet.  Grains Barley, bran, bulgur, cracked wheat, graham, malt, matzo, wheat germ, and all wheat and rye cereals  including spelt and kamut. Avoid cereals containing malt as a flavoring, such as rice cereal. Also avoid regular noodles, spaghetti, macaroni, and most packaged rice mixes, and all others containing wheat, rye, barley, or triticale.  Vegetables Most creamed vegetables, most vegetables canned in sauces, and any vegetables prepared with wheat, rye, barley, or triticale.  Fruits Thickened or prepared fruits and some pie fillings.  Meats and Other Protein Sources Any meat or meat alternative containing wheat, rye, barley, or gluten stabilizers (such as some hot dogs, salami, cold cuts, or sausage). Bread-containing products, such as Swiss steak, croquettes, and meatloaf. Most tuna canned in vegetable broth, turkey with hydrolyzed vegetable protein (HVP) injected as part of the basting, and any cheese product containing oat gum as an ingredient. Seitan. Imitation fish. Dairy Commercial chocolate milk, which may have cereal added, and malted milk. Beverages Certain cereal beverages. Beer and ciders (unless GF), ale, malted milk, and some root beers. Sweetsand Desserts Commercial candies containing wheat, rye, barley, or triticale. Certain toffees are dusted with wheat flour. Chocolate-coated nuts, which are often rolled in flour. Cakes, cookies, doughnuts, and pastries that are prepared with wheat, barley, rye, or triticale flour. Some commercial ice creams, ice cream flavors which contain cookies, crumbs, or cheesecake. Ice cream cones. Commercially prepared mixes for cakes, cookies, and other desserts unless marked GF. Bread pudding and other puddings thickened with flour. Fats and Oils Some commercial salad dressings and sour cream containing modified food starch.  Condiments Some curry powder, some dry seasoning mixes, some gravy extracts, some meat sauces, some ketchup, some prepared mustard, horseradish. Other All soups containing wheat,   rye, barley, or triticale flour. Bouillon and bouillon  cubes that contain HVP. Combination or prepared foods that contain gluten. Some soy sauce, some chip dips, and some chewing gum. Yeast extract (contains barley). Caramel color (may contain malt). The items listed above may not be a complete list of foods and beverages to avoid. Contact your dietitian for more information.   This information is not intended to replace advice given to you by your health care provider. Make sure you discuss any questions you have with your health care provider.   Document Released: 05/12/2005 Document Revised: 06/02/2014 Document Reviewed: 03/16/2013 Elsevier Interactive Patient Education 2016 [employer]  

## 2015-06-21 NOTE — Progress Notes (Signed)
Pre visit review using our clinic review tool, if applicable. No additional management support is needed unless otherwise documented below in the visit note. 

## 2015-06-21 NOTE — Progress Notes (Signed)
Subjective:    Patient ID: [name], female    DOB: 02-22-1966, 49 y.o.   MRN: [id number]  Chief Complaint  Patient presents with  . Follow-up    HPI Patient is in today for f/u GI for elevated alk phos and labs.  Past Medical History  Diagnosis Date  . Hypertension   . GERD (gastroesophageal reflux disease)   . IBS (irritable bowel syndrome)   . Diastolic dysfunction   . Obstructive sleep apnea   . Goiter   . Diverticulosis of colon   . Anemia, iron deficiency   . Thrombocytosis ([place])   . Morbid obesity ([place])   . Hiatal hernia   . Allergic rhinitis, cause unspecified   . Undiagnosed cardiac murmurs   . COPD (chronic obstructive pulmonary disease) [facility])     Past Surgical History  Procedure Laterality Date  . Lipoma excision      upper back  . Endometrial ablation  12/08  . Bariatric surgery  06/17/2010  . Spine surgery  02/10/2012    L4-l5 fusion--cohen  . Laparoscopic assisted vaginal hysterectomy N/A 06/22/2014    Procedure: LAPAROSCOPIC ASSISTED VAGINAL HYSTERECTOMY;  Surgeon: Crawford Givens, MD;  Location: [facility];  Service: Gynecology;  Laterality: N/A;  . Bilateral salpingectomy Bilateral 06/22/2014    Procedure: BILATERAL SALPINGECTOMY;  Surgeon: Crawford Givens, MD;  Location: [facility];  Service: Gynecology;  Laterality: Bilateral;  . Cystoscopy N/A 06/22/2014    Procedure: CYSTOSCOPY;  Surgeon: Crawford Givens, MD;  Location: [facility];  Service: Gynecology;  Laterality: N/A;    Family History  Problem Relation Age of Onset  . Breast cancer Mother   . Arrhythmia Mother   . Heart disease Mother   . Cancer Mother 29    breast  . Hypertension Father   . Kidney cancer Father   . Colon polyps Father   . Lymphoma Father     Diffused Large B-cell Lymphoma  . Cancer Father     lymphoma  . Colon cancer Neg Hx   . Cancer Maternal Aunt 11    breast cancer    Social History   Social History  . Marital Status: Married    Spouse Name: N/A  . Number of Children:  3  . 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 Smoking status: Never Smoker   . Smokeless tobacco: Never Used     Comment: Never Used Tobacco  . Alcohol Use: No  . Drug Use: No  . Sexual Activity:    Partners: Male   Other Topics Concern  . Not on file   Social History Narrative    Outpatient Prescriptions Prior to Visit  Medication Sig Dispense Refill  . aspirin 81 MG tablet Take 162 mg by mouth daily.    . Cyanocobalamin (VITAMIN B-12 PO) Take 1 tablet by mouth daily.    . Echinacea 500 MG CAPS Take 500 mg by mouth 2 (two) times daily as needed.    . Flaxseed, Linseed, (FLAX SEED OIL) 1000 MG CAPS Take 1,000 mg by mouth daily.    . hydrochlorothiazide (HYDRODIURIL) 25 MG tablet TAKE 1 TABLET BY MOUTH DAILY 30 tablet 5  . hyoscyamine (LEVSIN, ANASPAZ) 0.125 MG tablet Take 1 tablet (0.125 mg total) by mouth every 4 (four) hours as needed. 120 tablet 0  . ibuprofen (ADVIL,MOTRIN) 600 MG tablet 1  po  pc  every 6 hours x 5 days then as  needed for pain 30 tablet 1  . ipratropium (ATROVENT) 0.03 % nasal spray     . levothyroxine (SYNTHROID, LEVOTHROID) 88 MCG tablet TAKE 1 TABLET (88 MCG TOTAL) BY MOUTH DAILY. 30 tablet 11  . loratadine (CLARITIN) 10 MG tablet Take 10 mg by mouth daily as needed.     Marland Kitchen losartan (COZAAR) 100 MG tablet Take 1 tablet (100 mg total) by mouth daily. 90 tablet 3  . milk thistle 175 MG tablet Take 240 mg by mouth daily.    . potassium chloride SA (K-DUR,KLOR-CON) 20 MEQ tablet Take 1 tablet (20 mEq total) by mouth daily. 30 tablet 5  . Vitamin D, Ergocalciferol, (DRISDOL) 50000 UNITS CAPS capsule TAKE 1 CAPSULE BY MOUTH EVERY 7 DAYS 4 capsule 2  . vitamin E 200 UNIT capsule Take 200 Units by mouth daily.     No facility-administered medications prior to visit.    Allergies  Allergen Reactions  . Penicillins Other (See Comments)    Causes severe, difficult to treat yeast infections    Review of  Systems  Constitutional: Negative for fever and malaise/fatigue.  HENT: Negative for congestion.   Eyes: Negative for discharge.  Respiratory: Negative for shortness of breath.   Cardiovascular: Negative for chest pain, palpitations and leg swelling.  Gastrointestinal: Negative for nausea and abdominal pain.  Genitourinary: Negative for dysuria.  Musculoskeletal: Negative for falls.  Skin: Negative for rash.  Neurological: Negative for loss of consciousness and headaches.  Endo/Heme/Allergies: Negative for environmental allergies.  Psychiatric/Behavioral: Negative for depression. The patient is not nervous/anxious.        Objective:    Physical Exam  Constitutional: She is oriented to person, place, and time. She appears well-developed and well-nourished.  HENT:  Head: Normocephalic and atraumatic.  Eyes: Conjunctivae and EOM are normal.  Neck: Normal range of motion. Neck supple. No JVD present. Carotid bruit is not present. No thyromegaly present.  Cardiovascular: Normal rate, regular rhythm and normal heart sounds.   No murmur heard. Pulmonary/Chest: Effort normal and breath sounds normal. No respiratory distress. She has no wheezes. She has no rales. She exhibits no tenderness.  Musculoskeletal: She exhibits no edema.  Neurological: She is alert and oriented to person, place, and time.  Psychiatric: She has a normal mood and affect.  Nursing note and vitals reviewed.   BP 112/74 mmHg  Pulse 59  Temp(Src) 98.1 F (36.7 C) (Oral)  Ht '5\' 3"'$  (1.6 m)  Wt 234 lb 9.6 oz (106.414 kg)  BMI 41.57 kg/m2  SpO2 98%  LMP 01/20/2011 Wt Readings from Last 3 Encounters:  06/21/15 234 lb 9.6 oz (106.414 kg)  06/18/15 233 lb (105.688 kg)  05/03/15 232 lb 12.8 oz (105.597 kg)     Lab Results  Component Value Date   WBC 8.2 06/18/2015   HGB 12.9 06/18/2015   HCT 39.9 06/18/2015   PLT 491* 06/18/2015   GLUCOSE 79 06/21/2015   CHOL 160 06/21/2015   TRIG 79.0 06/21/2015   HDL  47.30 06/21/2015   LDLCALC 97 06/21/2015   ALT 16 06/21/2015   AST 21 06/21/2015   NA 137 06/21/2015   K 3.6 06/21/2015   CL 101 06/21/2015   CREATININE 0.67 06/21/2015   BUN 13 06/21/2015   CO2 28 06/21/2015   TSH 2.38 03/26/2015   HGBA1C 5.8 07/28/2014    Lab Results  Component Value Date   TSH 2.38 03/26/2015   Lab Results  Component Value Date   WBC 8.2 06/18/2015  HGB 12.9 06/18/2015   HCT 39.9 06/18/2015   MCV 89 06/18/2015   PLT 491* 06/18/2015   Lab Results  Component Value Date   NA 137 06/21/2015   K 3.6 06/21/2015   CHLORIDE 103 06/18/2015   CO2 28 06/21/2015   GLUCOSE 79 06/21/2015   BUN 13 06/21/2015   CREATININE 0.67 06/21/2015   BILITOT 0.3 06/21/2015   ALKPHOS 121* 06/21/2015   AST 21 06/21/2015   ALT 16 06/21/2015   PROT 7.0 06/21/2015   ALBUMIN 4.0 06/21/2015   CALCIUM 8.8 06/21/2015   ANIONGAP 9 06/18/2015   EGFR >90 06/18/2015   GFR 119.75 06/21/2015   Lab Results  Component Value Date   CHOL 160 06/21/2015   Lab Results  Component Value Date   HDL 47.30 06/21/2015   Lab Results  Component Value Date   LDLCALC 97 06/21/2015   Lab Results  Component Value Date   TRIG 79.0 06/21/2015   Lab Results  Component Value Date   CHOLHDL 3 06/21/2015   Lab Results  Component Value Date   HGBA1C 5.8 07/28/2014       Assessment & Plan:   Problem List Items Addressed This Visit      Unprioritized   Essential hypertension   Relevant Orders   Lipid panel (Completed)   Comp Met (CMET) (Completed)    Other Visit Diagnoses    Generalized abdominal pain    -  Primary    Relevant Orders    Gliadin antibodies, serum    Tissue transglutaminase, IgA    Reticulin Antibody, IgA w reflex titer    Vitamin B12 (Completed)    HIV antibody    Vitamin D 1,25 dihydroxy    Need for hepatitis C screening test        Relevant Orders    Hepatitis C antibody    Hypothyroidism, unspecified hypothyroidism type        Relevant Orders     Thyroid Panel With TSH       I am having [name] maintain her loratadine, Cyanocobalamin (VITAMIN B-12 PO), aspirin, ibuprofen, potassium chloride SA, vitamin E, milk thistle, ipratropium, hydrochlorothiazide, levothyroxine, losartan, Vitamin D (Ergocalciferol), Echinacea, Flax Seed Oil, and hyoscyamine.  No orders of the defined types were placed in this encounter.     Garnet Koyanagi, DO

## 2015-06-22 LAB — COMPREHENSIVE METABOLIC PANEL
ALT: 16 U/L (ref 0–35)
AST: 21 U/L (ref 0–37)
Albumin: 4 g/dL (ref 3.5–5.2)
Alkaline Phosphatase: 121 U/L — ABNORMAL HIGH (ref 39–117)
BILIRUBIN TOTAL: 0.3 mg/dL (ref 0.2–1.2)
BUN: 13 mg/dL (ref 6–23)
CALCIUM: 8.8 mg/dL (ref 8.4–10.5)
CHLORIDE: 101 meq/L (ref 96–112)
CO2: 28 meq/L (ref 19–32)
Creatinine, Ser: 0.67 mg/dL (ref 0.40–1.20)
GFR: 119.75 mL/min (ref >60.00)
GLUCOSE: 79 mg/dL (ref 70–99)
POTASSIUM: 3.6 meq/L (ref 3.5–5.1)
Sodium: 137 mEq/L (ref 135–145)
Total Protein: 7 g/dL (ref 6.0–8.3)

## 2015-06-22 LAB — LIPID PANEL
CHOL/HDL RATIO: 3
Cholesterol: 160 mg/dL (ref 0–200)
HDL: 47.3 mg/dL (ref >39.00)
LDL CALC: 97 mg/dL (ref 0–99)
NONHDL: 113.06
TRIGLYCERIDES: 79 mg/dL (ref 0.0–149.0)
VLDL: 15.8 mg/dL (ref 0.0–40.0)

## 2015-06-22 LAB — HIV ANTIBODY (ROUTINE TESTING W REFLEX): HIV: NONREACTIVE

## 2015-06-22 LAB — TISSUE TRANSGLUTAMINASE, IGA: Tissue Transglutaminase Ab, IgA: 1 U/mL (ref <4)

## 2015-06-22 LAB — VITAMIN B12: Vitamin B-12: 1168 pg/mL — ABNORMAL HIGH (ref 211–911)

## 2015-06-22 LAB — GLIADIN ANTIBODIES, SERUM
GLIADIN IGG: 1 U (ref <20)
Gliadin IgA: 5 Units (ref <20)

## 2015-06-22 LAB — HEPATITIS C ANTIBODY: HCV Ab: NEGATIVE

## 2015-06-22 MED ORDER — LEVOTHYROXINE SODIUM 88 MCG PO TABS: ORAL_TABLET | ORAL | Status: DC

## 2015-06-22 MED ORDER — LOSARTAN POTASSIUM 100 MG PO TABS: 100.0000 mg | ORAL_TABLET | Freq: Every day | ORAL | Status: DC

## 2015-06-22 MED ORDER — LORATADINE 10 MG PO TABS: 10.0000 mg | ORAL_TABLET | Freq: Every day | ORAL | Status: DC | PRN

## 2015-06-24 ENCOUNTER — Encounter: Payer: Self-pay | Admitting: Family Medicine

## 2015-06-25 ENCOUNTER — Other Ambulatory Visit: Payer: Self-pay

## 2015-06-25 DIAGNOSIS — M359 Systemic involvement of connective tissue, unspecified: Secondary | ICD-10-CM

## 2015-06-25 LAB — VITAMIN D 1,25 DIHYDROXY
VITAMIN D 1, 25 (OH) TOTAL: 52 pg/mL (ref 18–72)
VITAMIN D3 1, 25 (OH): 21 pg/mL
Vitamin D2 1, 25 (OH)2: 31 pg/mL

## 2015-06-25 LAB — RETICULIN ANTIBODIES, IGA W TITER

## 2015-06-25 NOTE — Addendum Note (Signed)
Addended by: Peggyann Shoals on: 06/25/2015 03:03 PM   Modules accepted: Orders

## 2015-06-25 NOTE — Telephone Encounter (Signed)
Ok to refer to rheum-- hx mixed connective disorder b12 high--- extra is excreted in urine-- ok to cut down

## 2015-06-28 ENCOUNTER — Other Ambulatory Visit: Payer: 59

## 2015-07-04 ENCOUNTER — Ambulatory Visit: Payer: 59 | Admitting: Internal Medicine

## 2015-07-04 NOTE — Telephone Encounter (Signed)
would one of you guys check the status of her pending labs.     KP

## 2015-07-05 ENCOUNTER — Other Ambulatory Visit: Payer: BLUE CROSS/BLUE SHIELD

## 2015-07-05 DIAGNOSIS — E039 Hypothyroidism, unspecified: Secondary | ICD-10-CM

## 2015-07-05 DIAGNOSIS — R1084 Generalized abdominal pain: Secondary | ICD-10-CM

## 2015-07-05 DIAGNOSIS — Z9109 Other allergy status, other than to drugs and biological substances: Secondary | ICD-10-CM

## 2015-07-05 DIAGNOSIS — Z1159 Encounter for screening for other viral diseases: Secondary | ICD-10-CM

## 2015-07-05 DIAGNOSIS — I10 Essential (primary) hypertension: Secondary | ICD-10-CM

## 2015-07-08 LAB — RETICULIN ANTIBODIES, IGA W TITER: Reticulin Ab, IgA: NEGATIVE

## 2015-07-09 ENCOUNTER — Other Ambulatory Visit: Payer: Self-pay

## 2015-07-09 MED ORDER — VITAMIN D (ERGOCALCIFEROL) 1.25 MG (50000 UNIT) PO CAPS: ORAL_CAPSULE | ORAL | Status: DC

## 2015-07-17 ENCOUNTER — Encounter: Payer: Self-pay | Admitting: Family Medicine

## 2015-07-17 DIAGNOSIS — G4730 Sleep apnea, unspecified: Secondary | ICD-10-CM

## 2015-07-18 ENCOUNTER — Other Ambulatory Visit: Payer: Self-pay

## 2015-07-18 DIAGNOSIS — Z9109 Other allergy status, other than to drugs and biological substances: Secondary | ICD-10-CM

## 2015-07-18 MED ORDER — VITAMIN D (ERGOCALCIFEROL) 1.25 MG (50000 UNIT) PO CAPS: ORAL_CAPSULE | ORAL | Status: DC

## 2015-07-18 MED ORDER — HYDROCHLOROTHIAZIDE 25 MG PO TABS: 25.0000 mg | ORAL_TABLET | Freq: Every day | ORAL | Status: DC

## 2015-07-18 MED ORDER — LORATADINE 10 MG PO TABS: 10.0000 mg | ORAL_TABLET | Freq: Every day | ORAL | Status: DC | PRN

## 2015-07-18 NOTE — Telephone Encounter (Signed)
Needs pulmonary referral for sleep apnea

## 2015-07-19 ENCOUNTER — Other Ambulatory Visit: Payer: Self-pay

## 2015-07-19 DIAGNOSIS — E039 Hypothyroidism, unspecified: Secondary | ICD-10-CM

## 2015-07-19 MED ORDER — LEVOTHYROXINE SODIUM 88 MCG PO TABS: ORAL_TABLET | ORAL | Status: DC

## 2015-07-31 ENCOUNTER — Encounter: Payer: Self-pay | Admitting: Family Medicine

## 2015-08-17 ENCOUNTER — Encounter: Payer: Self-pay | Admitting: Family

## 2015-08-17 ENCOUNTER — Other Ambulatory Visit: Payer: Self-pay | Admitting: Family Medicine

## 2015-09-25 ENCOUNTER — Encounter: Payer: 59 | Admitting: Family Medicine

## 2015-10-05 ENCOUNTER — Ambulatory Visit (INDEPENDENT_AMBULATORY_CARE_PROVIDER_SITE_OTHER): Payer: BLUE CROSS/BLUE SHIELD | Admitting: Pulmonary Disease

## 2015-10-05 ENCOUNTER — Encounter: Payer: Self-pay | Admitting: Pulmonary Disease

## 2015-10-05 VITALS — BP 110/70 | HR 53 | Ht 64.0 in | Wt 234.8 lb

## 2015-10-05 DIAGNOSIS — G4710 Hypersomnia, unspecified: Secondary | ICD-10-CM

## 2015-10-05 DIAGNOSIS — G4733 Obstructive sleep apnea (adult) (pediatric): Secondary | ICD-10-CM | POA: Diagnosis not present

## 2015-10-05 DIAGNOSIS — J309 Allergic rhinitis, unspecified: Secondary | ICD-10-CM | POA: Diagnosis not present

## 2015-10-05 NOTE — Assessment & Plan Note (Signed)
Stable.  May need flonase if with worsening on cpap

## 2015-10-05 NOTE — Patient Instructions (Signed)
1. We will schedule a split-night study on you. We will order a cpap machine after the study is done.  2. Let us know if her having issues with CPAP.  Return to clinic in 3 mos.

## 2015-10-05 NOTE — Progress Notes (Signed)
Subjective:    Patient ID: [name], female    DOB: 1965/11/08, 50 y.o.   MRN: [id number]  HPI   This is the case of [name], 50 y.o. Female, who was referred by Dr. Roma Schanz  in consultation regarding OSA.    As you very well know, patient is a non smoker, no known lung dse.  Pt had OSA in 2008. Severe. Used cpap. Tolerated cpap.  Pt had gastric bypass on 2012. Lost 85 lbs.  She clinically improved so she stopped cpap.  Has gained 20 lbs, and starting to be symptomatic. Has hypersomnia, frequent awakenings.  Sleeps 4 hrs/night. Difficult to stay asleep.  Has snoring, gasping, choking.  (-) abn behavior in sleep.  Has tossing and turning.            Review of Systems  Constitutional: Negative.  Negative for fever, chills and unexpected weight change.  HENT: Negative.  Negative for congestion, dental problem, ear pain, nosebleeds, postnasal drip, rhinorrhea, sinus pressure, sneezing, sore throat, trouble swallowing and voice change.   Eyes: Negative.  Negative for visual disturbance.  Respiratory: Positive for shortness of breath. Negative for cough and choking.   Cardiovascular: Negative.  Negative for chest pain and leg swelling.  Gastrointestinal: Negative.  Negative for vomiting, abdominal pain and diarrhea.  Endocrine: Negative.   Genitourinary: Negative.  Negative for difficulty urinating.  Musculoskeletal: Negative.  Negative for arthralgias.  Skin: Negative for rash.  Allergic/Immunologic: Negative.   Neurological: Negative.  Negative for tremors, syncope and headaches.  Hematological: Negative.  Does not bruise/bleed easily.  Psychiatric/Behavioral: Negative.   All other systems reviewed and are negative.  Past Medical History  Diagnosis Date  . Hypertension   . GERD (gastroesophageal reflux disease)   . IBS (irritable bowel syndrome)   . Diastolic dysfunction   . Obstructive sleep apnea   . Goiter   . Diverticulosis of colon   .  Anemia, iron deficiency   . Thrombocytosis ([place])   . Morbid obesity ([place])   . Hiatal hernia   . Allergic rhinitis, cause unspecified   . Undiagnosed cardiac murmurs   . COPD (chronic obstructive pulmonary disease) ([facility])    (-) CA DVT in R arm in 2016 >> related to thrombocytosis. Onc is following her fro thrombocytosis.   Family History  Problem Relation Age of Onset  . Breast cancer Mother   . Arrhythmia Mother   . Heart disease Mother   . Cancer Mother 37    breast  . Hypertension Father   . Kidney cancer Father   . Colon polyps Father   . Lymphoma Father     Diffused Large B-cell Lymphoma  . Cancer Father     lymphoma  . Colon cancer Neg Hx   . Cancer Maternal Aunt 58    breast cancer     Past Surgical History  Procedure Laterality Date  . Lipoma excision      upper back  . Endometrial ablation  12/08  . Bariatric surgery  06/17/2010  . Spine surgery  02/10/2012    L4-l5 fusion--cohen  . Laparoscopic assisted vaginal hysterectomy N/A 06/22/2014    Procedure: LAPAROSCOPIC ASSISTED VAGINAL HYSTERECTOMY;  Surgeon: Crawford Givens, MD;  Location: [facility];  Service: Gynecology;  Laterality: N/A;  . Bilateral salpingectomy Bilateral 06/22/2014    Procedure: BILATERAL SALPINGECTOMY;  Surgeon: Crawford Givens, MD;  Location: [facility];  Service: Gynecology;  Laterality: Bilateral;  . Cystoscopy N/A 06/22/2014    Procedure: CYSTOSCOPY;  Surgeon: Crawford Givens, MD;  Location: [facility];  Service: Gynecology;  Laterality: N/A;    Social History   Social History  . Marital Status: Married    Spouse Name: N/A  . Number of Children: 3  . 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 Smoking status: Never Smoker   . Smokeless tobacco: Never Used     Comment: Never Used Tobacco  . Alcohol Use: No  . Drug Use: No  . Sexual Activity:    Partners: Male   Other Topics Concern  . Not on file   Social History Narrative     Allergies    Allergen Reactions  . Penicillins Other (See Comments)    Causes severe, difficult to treat yeast infections     Outpatient Prescriptions Prior to Visit  Medication Sig Dispense Refill  . Cyanocobalamin (VITAMIN B-12 PO) Take 1 tablet by mouth daily.    . Echinacea 500 MG CAPS Take 500 mg by mouth 2 (two) times daily as needed.    . Flaxseed, Linseed, (FLAX SEED OIL) 1000 MG CAPS Take 1,000 mg by mouth daily.    . hydrochlorothiazide (HYDRODIURIL) 25 MG tablet Take 1 tablet (25 mg total) by mouth daily. 90 tablet 1  . ibuprofen (ADVIL,MOTRIN) 600 MG tablet 1  po  pc  every 6 hours x 5 days then as needed for pain 30 tablet 1  . ipratropium (ATROVENT) 0.03 % nasal spray     . KLOR-CON M20 20 MEQ tablet TAKE 1 TABLET (20 MEQ TOTAL) BY MOUTH DAILY. 30 tablet 0  . levothyroxine (SYNTHROID, LEVOTHROID) 88 MCG tablet TAKE 1 TABLET (88 MCG TOTAL) BY MOUTH DAILY. 90 tablet 2  . loratadine (CLARITIN) 10 MG tablet Take 1 tablet (10 mg total) by mouth daily as needed. 90 tablet 3  . losartan (COZAAR) 100 MG tablet Take 1 tablet (100 mg total) by mouth daily. 90 tablet 3  . Vitamin D, Ergocalciferol, (DRISDOL) 50000 units CAPS capsule TAKE 1 CAPSULE BY MOUTH EVERY 7 DAYS 12 capsule 1  . vitamin E 200 UNIT capsule Take 200 Units by mouth daily. Reported on 10/05/2015    . milk thistle 175 MG tablet Take 240 mg by mouth daily.    Marland Kitchen aspirin 81 MG tablet Take 162 mg by mouth daily. Reported on 10/05/2015    . hyoscyamine (LEVSIN, ANASPAZ) 0.125 MG tablet Take 1 tablet (0.125 mg total) by mouth every 4 (four) hours as needed. (Patient not taking: Reported on 10/05/2015) 120 tablet 0   No facility-administered medications prior to visit.   Meds ordered this encounter  Medications  . Glucosamine Sulfate 1000 MG CAPS    Sig: Take by mouth.  . magnesium 30 MG tablet    Sig: Take 30 mg by mouth 2 (two) times daily.           Objective:   Physical Exam  Vitals:  Filed Vitals:   10/05/15 0952  BP:  110/70  Pulse: 53  Height: 5\' 4"  (1.626 m)  Weight: 234 lb 12.8 oz (106.505 kg)  SpO2: 99%    Constitutional/General:  Pleasant, well-nourished, well-developed, not in any distress,  Comfortably seating.  Well kempt  Body mass index is 40.28 kg/(m^2). Wt Readings from Last 3 Encounters:  10/05/15 234 lb 12.8 oz (106.505 kg)  06/21/15 234 lb 9.6 oz (106.414 kg)  06/18/15 233 lb (105.688 kg)    Neck circumference:  HEENT: Pupils equal and reactive to light and accommodation. Anicteric sclerae. Normal nasal mucosa.   No oral  lesions,  mouth clear,  oropharynx clear, no postnasal drip. (-) Oral thrush. No dental caries.  Airway - Mallampati class III  Neck: No masses. Midline trachea. No JVD, (-) LAD. (-) bruits appreciated.  Respiratory/Chest: Grossly normal chest. (-) deformity. (-) Accessory muscle use.  Symmetric expansion. (-) Tenderness on palpation.  Resonant on percussion.  Diminished BS on both lower lung zones. (-) wheezing, crackles, rhonchi (-) egophony  Cardiovascular: Regular rate and  rhythm, heart sounds normal, no murmur or gallops, no peripheral edema  Gastrointestinal:  Normal bowel sounds. Soft, non-tender. No hepatosplenomegaly.  (-) masses.   Musculoskeletal:  Normal muscle tone. Normal gait.   Extremities: Grossly normal. (-) clubbing, cyanosis.  (-) edema  Skin: (-) rash,lesions seen.   Neurological/Psychiatric : alert, oriented to time, place, person. Normal mood and affect            Assessment & Plan:  Obstructive sleep apnea Pt had OSA in 2008. Severe. Used cpap. Tolerated cpap.  Pt had gastric bypass on 2012. Lost 85 lbs.  She clinically improved so she stopped cpap.  Has gained 20 lbs, and starting to be symptomatic. Also menopausal.  Has hypersomnia, frequent awakenings.  Sleeps 4 hrs/night. Difficult to stay asleep.  Has snoring, gasping, choking.  (-) abn behavior in sleep.  Has tossing and turning.   ESS 10. Neck  circ 15 in.  Plan : 1. Split night study. 2. Try auto CPAP. She was on CPAP before. Anticipate no issues. Tolerated CPAP in the past.    Allergic rhinitis Stable.  May need flonase if with worsening on cpap  MORBID OBESITY Weight reduction     Thank you very much for letting me participate in this patient's care. Please do not hesitate to give me a call if you have any questions or concerns regarding the treatment plan.   Patient will follow up with me in 3 mos.     Monica Becton, MD 10/05/2015   10:23 AM Pulmonary and [place] Pager: [phone] Office: [phone], Fax: [phone]

## 2015-10-05 NOTE — Assessment & Plan Note (Signed)
Pt had OSA in 2008. Severe. Used cpap. Tolerated cpap.  Pt had gastric bypass on 2012. Lost 85 lbs.  She clinically improved so she stopped cpap.  Has gained 20 lbs, and starting to be symptomatic. Also menopausal.  Has hypersomnia, frequent awakenings.  Sleeps 4 hrs/night. Difficult to stay asleep.  Has snoring, gasping, choking.  (-) abn behavior in sleep.  Has tossing and turning.   ESS 10. Neck circ 15 in.  Plan : 1. Split night study. 2. Try auto CPAP. She was on CPAP before. Anticipate no issues. Tolerated CPAP in the past.

## 2015-10-05 NOTE — Assessment & Plan Note (Signed)
Weight reduction 

## 2015-10-12 ENCOUNTER — Other Ambulatory Visit: Payer: Self-pay | Admitting: Emergency Medicine

## 2015-10-12 MED ORDER — POTASSIUM CHLORIDE CRYS ER 20 MEQ PO TBCR: 20.0000 meq | EXTENDED_RELEASE_TABLET | Freq: Every day | ORAL | Status: DC

## 2015-10-18 ENCOUNTER — Ambulatory Visit (HOSPITAL_BASED_OUTPATIENT_CLINIC_OR_DEPARTMENT_OTHER): Payer: BLUE CROSS/BLUE SHIELD | Admitting: Family

## 2015-10-18 ENCOUNTER — Encounter: Payer: Self-pay | Admitting: Family

## 2015-10-18 ENCOUNTER — Other Ambulatory Visit (HOSPITAL_BASED_OUTPATIENT_CLINIC_OR_DEPARTMENT_OTHER): Payer: BLUE CROSS/BLUE SHIELD

## 2015-10-18 VITALS — BP 130/67 | HR 52 | Temp 97.6°F | Resp 16 | Ht 64.0 in | Wt 235.0 lb

## 2015-10-18 DIAGNOSIS — D473 Essential (hemorrhagic) thrombocythemia: Secondary | ICD-10-CM

## 2015-10-18 DIAGNOSIS — R5382 Chronic fatigue, unspecified: Secondary | ICD-10-CM

## 2015-10-18 DIAGNOSIS — D75839 Thrombocytosis, unspecified: Secondary | ICD-10-CM

## 2015-10-18 DIAGNOSIS — Z807 Family history of other malignant neoplasms of lymphoid, hematopoietic and related tissues: Secondary | ICD-10-CM

## 2015-10-18 DIAGNOSIS — G8929 Other chronic pain: Secondary | ICD-10-CM

## 2015-10-18 DIAGNOSIS — R1013 Epigastric pain: Principal | ICD-10-CM

## 2015-10-18 DIAGNOSIS — R7989 Other specified abnormal findings of blood chemistry: Secondary | ICD-10-CM

## 2015-10-18 LAB — COMPREHENSIVE METABOLIC PANEL
ALT: 17 U/L (ref 0–55)
AST: 20 U/L (ref 5–34)
Albumin: 3.9 g/dL (ref 3.5–5.0)
Alkaline Phosphatase: 146 U/L (ref 40–150)
Anion Gap: 13 mEq/L — ABNORMAL HIGH (ref 3–11)
BILIRUBIN TOTAL: 0.36 mg/dL (ref 0.20–1.20)
BUN: 16.1 mg/dL (ref 7.0–26.0)
CHLORIDE: 103 meq/L (ref 98–109)
CO2: 27 mEq/L (ref 22–29)
CREATININE: 0.7 mg/dL (ref 0.6–1.1)
Calcium: 9.8 mg/dL (ref 8.4–10.4)
EGFR: >90 mL/min/{1.73_m2} (ref >90)
GLUCOSE: 89 mg/dL (ref 70–140)
Potassium: 4.4 mEq/L (ref 3.5–5.1)
SODIUM: 143 meq/L (ref 136–145)
TOTAL PROTEIN: 7.5 g/dL (ref 6.4–8.3)

## 2015-10-18 LAB — CBC WITH DIFFERENTIAL (CANCER CENTER ONLY)
BASO#: 0 10*3/uL (ref 0.0–0.2)
BASO%: 0.5 % (ref 0.0–2.0)
EOS%: 1 % (ref 0.0–7.0)
Eosinophils Absolute: 0.1 10*3/uL (ref 0.0–0.5)
HCT: 39.6 % (ref 34.8–46.6)
HGB: 13.2 g/dL (ref 11.6–15.9)
LYMPH#: 3.4 10*3/uL — AB (ref 0.9–3.3)
LYMPH%: 44 % (ref 14.0–48.0)
MCH: 29.5 pg (ref 26.0–34.0)
MCHC: 33.3 g/dL (ref 32.0–36.0)
MCV: 89 fL (ref 81–101)
MONO#: 0.6 10*3/uL (ref 0.1–0.9)
MONO%: 7.6 % (ref 0.0–13.0)
NEUT#: 3.6 10*3/uL (ref 1.5–6.5)
NEUT%: 46.9 % (ref 39.6–80.0)
PLATELETS: 450 10*3/uL — AB (ref 145–400)
RBC: 4.47 10*6/uL (ref 3.70–5.32)
RDW: 13.6 % (ref 11.1–15.7)
WBC: 7.6 10*3/uL (ref 3.9–10.0)

## 2015-10-18 LAB — LACTATE DEHYDROGENASE: LDH: 220 U/L (ref 125–245)

## 2015-10-18 NOTE — Progress Notes (Signed)
Hematology and Oncology Follow Up Visit  [name] [id number] 24-Oct-1965 50 y.o. 10/18/2015   Principle Diagnosis:  1. Thrombocytosis 2. Family history of lymphoma (father)  Current Therapy:  Observation    Interim History:  [name] is here today for a follow-up. She is feeling a little better but still having issues with fatigue and bradycardia. She has seen cardiology for this in the past.  She is now followed again by Rheumatology for her mixed connective tissue disorder.  Her platelet count continues to improve and is now down to 450. She has had no issues with bleeding or bruising.  No fever, chills, n/v, cough, rash, dizziness, headache, vision changes, chest pain, palpitations or changes in bowel or bladder habits.  She has back pain due to chronic cervical disc and endplate degeneration at C6-C7. This causes her to have numbness and tingling in her fingertips that comes and goes.  No swelling or tenderness in her extremities at this time.  She is staying well hydrated and has a good appetite. Her weight is unchanged.   Medications:    Medication List       This list is accurate as of: 10/18/15 11:03 AM.  Always use your most recent med list.               Echinacea 500 MG Caps  Take 500 mg by mouth 2 (two) times daily as needed.     Flax Seed Oil 1000 MG Caps  Take 1,000 mg by mouth daily.     Glucosamine Sulfate 1000 MG Caps  Take by mouth.     hydrochlorothiazide 25 MG tablet  Commonly known as:  HYDRODIURIL  Take 1 tablet (25 mg total) by mouth daily.     ibuprofen 600 MG tablet  Commonly known as:  ADVIL,MOTRIN  1  po  pc  every 6 hours x 5 days then as needed for pain     ipratropium 0.03 % nasal spray  Commonly known as:  ATROVENT     levothyroxine 88 MCG tablet  Commonly known as:  SYNTHROID, LEVOTHROID  TAKE 1 TABLET (88 MCG TOTAL) BY MOUTH DAILY.     loratadine 10 MG tablet  Commonly known as:  CLARITIN  Take 1 tablet (10 mg total) by  mouth daily as needed.     losartan 100 MG tablet  Commonly known as:  COZAAR  Take 1 tablet (100 mg total) by mouth daily.     magnesium 30 MG tablet  Take 30 mg by mouth 2 (two) times daily.     potassium chloride SA 20 MEQ tablet  Commonly known as:  KLOR-CON M20  Take 1 tablet (20 mEq total) by mouth daily.     VITAMIN B-12 PO  Take 1 tablet by mouth daily.     Vitamin D (Ergocalciferol) 50000 units Caps capsule  Commonly known as:  DRISDOL  TAKE 1 CAPSULE BY MOUTH EVERY 7 DAYS     vitamin E 200 UNIT capsule  Take 200 Units by mouth daily. Reported on 10/05/2015        Allergies:  Allergies  Allergen Reactions  . Penicillins Other (See Comments)    Causes severe, difficult to treat yeast infections    Past Medical History, Surgical history, Social history, and Family History were reviewed and updated.  Review of Systems: All other 10 point review of systems is negative.   Physical Exam:  vitals were not taken for this visit.  Wt Readings from Last  3 Encounters:  10/05/15 234 lb 12.8 oz (106.505 kg)  06/21/15 234 lb 9.6 oz (106.414 kg)  06/18/15 233 lb (105.688 kg)    Ocular: Sclerae unicteric, pupils equal, round and reactive to light Ear-nose-throat: Oropharynx clear, dentition fair Lymphatic: No supraclavicular cervical or axillary adenopathy Lungs no rales or rhonchi, good excursion bilaterally Heart regular rate and rhythm, no murmur appreciated Abd soft, nontender, positive bowel sounds, no liver or spleen tip palpated on exam, no fluid wave MSK no focal spinal tenderness, no joint edema Neuro: non-focal, well-oriented, appropriate affect Breasts: Deferred  Lab Results  Component Value Date   WBC 8.2 06/18/2015   HGB 12.9 06/18/2015   HCT 39.9 06/18/2015   MCV 89 06/18/2015   PLT 491* 06/18/2015   Lab Results  Component Value Date   FERRITIN 43 04/30/2015   IRON 93 04/30/2015   TIBC 296 04/30/2015   UIBC 204 04/30/2015   IRONPCTSAT 31  04/30/2015   Lab Results  Component Value Date   RETICCTPCT 1.8 04/19/2014   RBC 4.48 06/18/2015   RETICCTABS 79.2 04/19/2014   No results found for: KPAFRELGTCHN, LAMBDASER, KAPLAMBRATIO No results found for: IGGSERUM, IGA, IGMSERUM No results found for: Odetta Pink, SPEI   Chemistry      Component Value Date/Time   NA 137 06/21/2015 1531   NA 141 06/18/2015 1100   K 3.6 06/21/2015 1531   K 4.0 06/18/2015 1100   CL 101 06/21/2015 1531   CO2 28 06/21/2015 1531   CO2 29 06/18/2015 1100   BUN 13 06/21/2015 1531   BUN 15.0 06/18/2015 1100   CREATININE 0.67 06/21/2015 1531   CREATININE 0.7 06/18/2015 1100   CREATININE 0.72 01/10/2011 1545      Component Value Date/Time   CALCIUM 8.8 06/21/2015 1531   CALCIUM 9.4 06/18/2015 1100   ALKPHOS 121* 06/21/2015 1531   ALKPHOS 148 06/18/2015 1100   AST 21 06/21/2015 1531   AST 22 06/18/2015 1100   ALT 16 06/21/2015 1531   ALT 20 06/18/2015 1100   BILITOT 0.3 06/21/2015 1531   BILITOT 0.49 06/18/2015 1100     Impression and Plan: [name] is a very pleasant 50 yo African American female with a history of thrombocytosis. Her platelet count continues to improve at 450. So far, this has not been an issue for her. She remains asymptomatic.  No anemia or leukopenia.  We will plan to see her back in 6 months for labs and follow-up.  She will contact us with any questions or concerns and we will schedule an appointment at that time.    Eliezer Bottom, NP 5/25/201711:03 AM

## 2015-10-19 ENCOUNTER — Other Ambulatory Visit: Payer: Self-pay

## 2015-10-19 DIAGNOSIS — Z1231 Encounter for screening mammogram for malignant neoplasm of breast: Secondary | ICD-10-CM

## 2015-10-24 ENCOUNTER — Other Ambulatory Visit: Payer: Self-pay

## 2015-10-24 MED ORDER — POTASSIUM CHLORIDE CRYS ER 20 MEQ PO TBCR: 20.0000 meq | EXTENDED_RELEASE_TABLET | Freq: Every day | ORAL | Status: DC

## 2015-10-24 MED ORDER — VITAMIN D (ERGOCALCIFEROL) 1.25 MG (50000 UNIT) PO CAPS: ORAL_CAPSULE | ORAL | Status: DC

## 2015-11-02 ENCOUNTER — Ambulatory Visit
Admission: RE | Admit: 2015-11-02 | Discharge: 2015-11-02 | Disposition: A | Discharge status: Home / Self Care | Payer: BLUE CROSS/BLUE SHIELD | Source: Ambulatory Visit

## 2015-11-02 DIAGNOSIS — Z1231 Encounter for screening mammogram for malignant neoplasm of breast: Secondary | ICD-10-CM

## 2015-11-09 ENCOUNTER — Encounter: Payer: Self-pay | Admitting: Family Medicine

## 2015-11-09 ENCOUNTER — Ambulatory Visit (INDEPENDENT_AMBULATORY_CARE_PROVIDER_SITE_OTHER): Payer: BLUE CROSS/BLUE SHIELD | Admitting: Family Medicine

## 2015-11-09 VITALS — BP 124/83 | HR 68 | Temp 98.2°F | Wt 237.0 lb

## 2015-11-09 DIAGNOSIS — R1010 Upper abdominal pain, unspecified: Secondary | ICD-10-CM

## 2015-11-09 DIAGNOSIS — M5489 Other dorsalgia: Secondary | ICD-10-CM

## 2015-11-09 LAB — POC URINALSYSI DIPSTICK (AUTOMATED)
Bilirubin, UA: NEGATIVE
Blood, UA: NEGATIVE
GLUCOSE UA: NEGATIVE
Ketones, UA: NEGATIVE
LEUKOCYTES UA: NEGATIVE
NITRITE UA: NEGATIVE
Protein, UA: NEGATIVE
Spec Grav, UA: >=1.03
UROBILINOGEN UA: 2
pH, UA: 5.5

## 2015-11-09 MED ORDER — OMEPRAZOLE 40 MG PO CPDR: 40.0000 mg | DELAYED_RELEASE_CAPSULE | Freq: Every day | ORAL | Status: DC

## 2015-11-09 NOTE — Progress Notes (Signed)
Patient ID: [name], female    DOB: 19-Dec-1965  Age: 50 y.o. MRN: [id number]    Subjective:  Subjective HPI [name] presents for ruq pain that con't-- she has seen GI, , rheum, cardiology over last several months------pt c/o nausea,  Food makes the pain worse.    Review of Systems  Constitutional: Negative for diaphoresis, appetite change, fatigue and unexpected weight change.  Eyes: Negative for pain, redness and visual disturbance.  Respiratory: Negative for cough, chest tightness, shortness of breath and wheezing.   Cardiovascular: Negative for chest pain, palpitations and leg swelling.  Gastrointestinal: Positive for nausea and abdominal pain. Negative for vomiting, constipation and blood in stool.  Endocrine: Negative for cold intolerance, heat intolerance, polydipsia, polyphagia and polyuria.  Genitourinary: Negative for dysuria, frequency and difficulty urinating.  Neurological: Negative for dizziness, light-headedness, numbness and headaches.    History Past Medical History  Diagnosis Date  . Hypertension   . GERD (gastroesophageal reflux disease)   . IBS (irritable bowel syndrome)   . Diastolic dysfunction   . Obstructive sleep apnea   . Goiter   . Diverticulosis of colon   . Anemia, iron deficiency   . Thrombocytosis ([place])   . Morbid obesity ([place])   . Hiatal hernia   . Allergic rhinitis, cause unspecified   . Undiagnosed cardiac murmurs   . COPD (chronic obstructive pulmonary disease) ([place])     She has past surgical history that includes Lipoma excision; Endometrial ablation (12/08); Bariatric Surgery (06/17/2010); Spine surgery (02/10/2012); Laparoscopic assisted vaginal hysterectomy (N/A, 06/22/2014); Bilateral salpingectomy (Bilateral, 06/22/2014); and Cystoscopy (N/A, 06/22/2014).   Her family history includes Arrhythmia in her mother; Breast cancer in her mother; Cancer in her father; Cancer (age of onset: 49) in her maternal aunt; Cancer (age of onset: 35)  in her mother; Colon polyps in her father; Heart disease in her mother; Hypertension in her father; Kidney cancer in her father; Lymphoma in her father. There is no history of Colon cancer.She reports that she has never smoked. She has never used smokeless tobacco. She reports that she does not drink alcohol or use illicit drugs.  Current Outpatient Prescriptions on File Prior to Visit  Medication Sig Dispense Refill  . Cyanocobalamin (VITAMIN B-12 PO) Take 1 tablet by mouth daily.    . Echinacea 500 MG CAPS Take 500 mg by mouth 2 (two) times daily as needed.    . Flaxseed, Linseed, (FLAX SEED OIL) 1000 MG CAPS Take 1,000 mg by mouth daily.    . Glucosamine Sulfate 1000 MG CAPS Take by mouth.    . hydrochlorothiazide (HYDRODIURIL) 25 MG tablet Take 1 tablet (25 mg total) by mouth daily. 90 tablet 1  . ibuprofen (ADVIL,MOTRIN) 600 MG tablet 1  po  pc  every 6 hours x 5 days then as needed for pain 30 tablet 1  . ipratropium (ATROVENT) 0.03 % nasal spray     . levothyroxine (SYNTHROID, LEVOTHROID) 88 MCG tablet TAKE 1 TABLET (88 MCG TOTAL) BY MOUTH DAILY. 90 tablet 2  . loratadine (CLARITIN) 10 MG tablet Take 1 tablet (10 mg total) by mouth daily as needed. 90 tablet 3  . losartan (COZAAR) 100 MG tablet Take 1 tablet (100 mg total) by mouth daily. 90 tablet 3  . magnesium 30 MG tablet Take 30 mg by mouth 2 (two) times daily.    . potassium chloride SA (KLOR-CON M20) 20 MEQ tablet Take 1 tablet (20 mEq total) by mouth daily. 90 tablet 1  .  Vitamin D, Ergocalciferol, (DRISDOL) 50000 units CAPS capsule TAKE 1 CAPSULE BY MOUTH EVERY 7 DAYS 12 capsule 1  . vitamin E 200 UNIT capsule Take 200 Units by mouth daily. Reported on 10/05/2015     No current facility-administered medications on file prior to visit.     Objective:  Objective Physical Exam  Constitutional: She is oriented to person, place, and time. She appears well-developed and well-nourished.  HENT:  Head: Normocephalic and atraumatic.    Eyes: Conjunctivae and EOM are normal.  Neck: Normal range of motion. Neck supple. No JVD present. Carotid bruit is not present. No thyromegaly present.  Cardiovascular: Normal rate, regular rhythm and normal heart sounds.   No murmur heard. Pulmonary/Chest: Effort normal and breath sounds normal. No respiratory distress. She has no wheezes. She has no rales. She exhibits no tenderness.  Abdominal: She exhibits no mass. There is tenderness in the right upper quadrant. There is no rebound and no guarding.  Musculoskeletal: She exhibits no edema.  Neurological: She is alert and oriented to person, place, and time.  Psychiatric: She has a normal mood and affect.  Nursing note and vitals reviewed.  BP 124/83 mmHg  Pulse 68  Temp(Src) 98.2 F (36.8 C) (Oral)  Wt 237 lb (107.502 kg)  SpO2 98%  LMP 01/20/2011 Wt Readings from Last 3 Encounters:  11/09/15 237 lb (107.502 kg)  10/18/15 235 lb (106.595 kg)  10/05/15 234 lb 12.8 oz (106.505 kg)     Lab Results  Component Value Date   WBC 7.6 10/18/2015   HGB 13.2 10/18/2015   HCT 39.6 10/18/2015   PLT 450* 10/18/2015   GLUCOSE 89 10/18/2015   CHOL 160 06/21/2015   TRIG 79.0 06/21/2015   HDL 47.30 06/21/2015   LDLCALC 97 06/21/2015   ALT 17 10/18/2015   AST 20 10/18/2015   NA 143 10/18/2015   K 4.4 10/18/2015   CL 101 06/21/2015   CREATININE 0.7 10/18/2015   BUN 16.1 10/18/2015   CO2 27 10/18/2015   TSH 2.38 03/26/2015   HGBA1C 5.8 07/28/2014    Mm Digital Screening Bilateral  11/05/2015  CLINICAL DATA:  Screening. EXAM: DIGITAL SCREENING BILATERAL MAMMOGRAM WITH CAD COMPARISON:  Previous exam(s). ACR Breast Density Category b: There are scattered areas of fibroglandular density. FINDINGS: There are no findings suspicious for malignancy. Images were processed with CAD. IMPRESSION: No mammographic evidence of malignancy. A result letter of this screening mammogram will be mailed directly to the patient. RECOMMENDATION: Screening  mammogram in one year. (Code:SM-B-01Y) BI-RADS CATEGORY  1: Negative. Electronically Signed   By: Pamelia Hoit M.D.   On: 11/05/2015 17:52     Assessment & Plan:  Plan I am having [name] start on omeprazole. I am also having her maintain her Cyanocobalamin (VITAMIN B-12 PO), ibuprofen, vitamin E, ipratropium, Echinacea, Flax Seed Oil, losartan, loratadine, hydrochlorothiazide, levothyroxine, Glucosamine Sulfate, magnesium, potassium chloride SA, and Vitamin D (Ergocalciferol).  Meds ordered this encounter  Medications  . omeprazole (PRILOSEC) 40 MG capsule    Sig: Take 1 capsule (40 mg total) by mouth daily.    Dispense:  30 capsule    Refill:  3    Problem List Items Addressed This Visit    None    Visit Diagnoses    Right-sided back pain, unspecified location    -  Primary    Relevant Orders    POCT Urinalysis Dipstick (Automated) (Completed)    Comprehensive metabolic panel    CBC with Differential/Platelet  TSH    Vitamin B12    Vitamin D 1,25 dihydroxy    Comprehensive metabolic panel    Amylase    Lipase    US Abdomen Limited RUQ    H. pylori antibody, IgG    Pain of upper abdomen        Relevant Medications    omeprazole (PRILOSEC) 40 MG capsule      Will get [place] -- consider referral vs hida scan after labs and US done Omeprazole daily Follow-up: Return if symptoms worsen or fail to improve.  Ann Held, DO

## 2015-11-09 NOTE — Progress Notes (Signed)
Pre visit review using our clinic review tool, if applicable. No additional management support is needed unless otherwise documented below in the visit note. 

## 2015-11-09 NOTE — Patient Instructions (Signed)

## 2015-11-12 ENCOUNTER — Other Ambulatory Visit: Payer: Self-pay | Admitting: Family Medicine

## 2015-11-12 ENCOUNTER — Ambulatory Visit (HOSPITAL_BASED_OUTPATIENT_CLINIC_OR_DEPARTMENT_OTHER)
Admission: RE | Admit: 2015-11-12 | Discharge: 2015-11-12 | Disposition: A | Discharge status: Home / Self Care | Payer: BLUE CROSS/BLUE SHIELD | Source: Ambulatory Visit | Attending: Family Medicine | Admitting: Family Medicine

## 2015-11-12 ENCOUNTER — Other Ambulatory Visit (INDEPENDENT_AMBULATORY_CARE_PROVIDER_SITE_OTHER): Payer: BLUE CROSS/BLUE SHIELD

## 2015-11-12 DIAGNOSIS — R1011 Right upper quadrant pain: Secondary | ICD-10-CM

## 2015-11-12 DIAGNOSIS — M5489 Other dorsalgia: Secondary | ICD-10-CM | POA: Diagnosis not present

## 2015-11-12 LAB — COMPREHENSIVE METABOLIC PANEL
ALBUMIN: 4 g/dL (ref 3.5–5.2)
ALT: 17 U/L (ref 0–35)
AST: 19 U/L (ref 0–37)
Alkaline Phosphatase: 135 U/L — ABNORMAL HIGH (ref 39–117)
BUN: 16 mg/dL (ref 6–23)
CHLORIDE: 103 meq/L (ref 96–112)
CO2: 27 meq/L (ref 19–32)
CREATININE: 0.61 mg/dL (ref 0.40–1.20)
Calcium: 9.1 mg/dL (ref 8.4–10.5)
GFR: 133.24 mL/min (ref >60.00)
Glucose, Bld: 96 mg/dL (ref 70–99)
POTASSIUM: 4.3 meq/L (ref 3.5–5.1)
SODIUM: 139 meq/L (ref 135–145)
Total Bilirubin: 0.4 mg/dL (ref 0.2–1.2)
Total Protein: 7.2 g/dL (ref 6.0–8.3)

## 2015-11-12 LAB — VITAMIN B12: VITAMIN B 12: 1265 pg/mL — AB (ref 211–911)

## 2015-11-12 LAB — CBC WITH DIFFERENTIAL/PLATELET
Basophils Absolute: 0 10*3/uL (ref 0.0–0.1)
Basophils Relative: 0.4 % (ref 0.0–3.0)
EOS ABS: 0.1 10*3/uL (ref 0.0–0.7)
EOS PCT: 1.4 % (ref 0.0–5.0)
HCT: 38.6 % (ref 36.0–46.0)
HEMOGLOBIN: 12.8 g/dL (ref 12.0–15.0)
Lymphocytes Relative: 40.6 % (ref 12.0–46.0)
Lymphs Abs: 2.7 10*3/uL (ref 0.7–4.0)
MCHC: 33.2 g/dL (ref 30.0–36.0)
MCV: 86.7 fl (ref 78.0–100.0)
MONO ABS: 0.5 10*3/uL (ref 0.1–1.0)
Monocytes Relative: 7.2 % (ref 3.0–12.0)
Neutro Abs: 3.3 10*3/uL (ref 1.4–7.7)
Neutrophils Relative %: 50.4 % (ref 43.0–77.0)
Platelets: 454 10*3/uL — ABNORMAL HIGH (ref 150.0–400.0)
RBC: 4.45 Mil/uL (ref 3.87–5.11)
RDW: 14.2 % (ref 11.5–15.5)
WBC: 6.5 10*3/uL (ref 4.0–10.5)

## 2015-11-12 LAB — AMYLASE: AMYLASE: 33 U/L (ref 27–131)

## 2015-11-12 LAB — TSH: TSH: 1.95 u[IU]/mL (ref 0.35–4.50)

## 2015-11-12 LAB — LIPASE: LIPASE: 23 U/L (ref 11.0–59.0)

## 2015-11-12 LAB — H. PYLORI ANTIBODY, IGG: H PYLORI IGG: NEGATIVE

## 2015-11-14 LAB — VITAMIN D 1,25 DIHYDROXY
VITAMIN D2 1, 25 (OH): 48 pg/mL
VITAMIN D3 1, 25 (OH): 18 pg/mL
Vitamin D 1, 25 (OH)2 Total: 66 pg/mL (ref 18–72)

## 2015-11-19 ENCOUNTER — Telehealth: Payer: Self-pay | Admitting: Family Medicine

## 2015-11-19 NOTE — Telephone Encounter (Signed)
°  Relationship to patient: Self Can be reached: [phone]   Reason for call: Request call back before labs are sent to GI.

## 2015-11-20 ENCOUNTER — Other Ambulatory Visit (HOSPITAL_COMMUNITY): Payer: BLUE CROSS/BLUE SHIELD

## 2015-11-20 NOTE — Telephone Encounter (Signed)
Patient aware she must contact GI to get approval to switch MDs

## 2015-11-20 NOTE — Telephone Encounter (Signed)
Spoke with patient and she requested to see someone other than Dr.Perry in the GI office, she said he did not do or go over much with her during her visit.     KP

## 2015-11-20 NOTE — Telephone Encounter (Signed)
I think she needs to call and request to see someone different --- is that right, [name]

## 2015-11-21 ENCOUNTER — Encounter (HOSPITAL_COMMUNITY)
Admission: RE | Admit: 2015-11-21 | Discharge: 2015-11-21 | Disposition: A | Discharge status: Home / Self Care | Payer: BLUE CROSS/BLUE SHIELD | Source: Ambulatory Visit | Attending: Family Medicine | Admitting: Family Medicine

## 2015-11-21 DIAGNOSIS — R1011 Right upper quadrant pain: Secondary | ICD-10-CM | POA: Insufficient documentation

## 2015-11-21 MED ORDER — TECHNETIUM TC 99M MEBROFENIN IV KIT: 5.0000 | PACK | Freq: Once | INTRAVENOUS | Status: DC | PRN

## 2015-11-22 ENCOUNTER — Other Ambulatory Visit: Payer: Self-pay

## 2015-11-22 ENCOUNTER — Ambulatory Visit (HOSPITAL_BASED_OUTPATIENT_CLINIC_OR_DEPARTMENT_OTHER): Payer: BLUE CROSS/BLUE SHIELD | Attending: Pulmonary Disease | Admitting: Pulmonary Disease

## 2015-11-22 DIAGNOSIS — K819 Cholecystitis, unspecified: Secondary | ICD-10-CM

## 2015-11-22 DIAGNOSIS — G4710 Hypersomnia, unspecified: Secondary | ICD-10-CM | POA: Diagnosis present

## 2015-11-22 DIAGNOSIS — Z6841 Body Mass Index (BMI) 40.0 and over, adult: Secondary | ICD-10-CM | POA: Insufficient documentation

## 2015-11-22 DIAGNOSIS — R0683 Snoring: Secondary | ICD-10-CM | POA: Insufficient documentation

## 2015-11-22 DIAGNOSIS — E669 Obesity, unspecified: Secondary | ICD-10-CM | POA: Insufficient documentation

## 2015-11-30 ENCOUNTER — Telehealth: Payer: Self-pay | Admitting: Pulmonary Disease

## 2015-11-30 DIAGNOSIS — G4710 Hypersomnia, unspecified: Secondary | ICD-10-CM

## 2015-11-30 NOTE — Telephone Encounter (Signed)
    Please call the pt and tell the pt the inlab SLEEP STUDY  did not show OSA.   If her symptoms of sleepiness are persistent, we can get a home sleep test in a month.   Pls ask her if she wants to follow up.    Thanks!   J. Shirl Harris, MD 11/30/2015, 11:26 PM

## 2015-11-30 NOTE — Procedures (Signed)
NAME: [name] DATE OF BIRTH:  [birth year] MEDICAL RECORD NUMBER [id number]  LOCATION: [facility]  PHYSICIAN: Webster Groves  DATE OF STUDY: 11/22/2015   Patient Name: [name], [name]   Study Date: 11/22/2015   Gender: Female  D.O.B: 1965/07/16  Age (years): 3  Referring Provider: [place]   Height (inches): 64  Interpreting Physician: [place]   Weight (lbs): 239  RPSGT: Baxter Flattery   BMI: 41  MRN: [id number]  Neck Size: 16.00    CLINICAL INFORMATION  Sleep Study Type: NPSG  Indication for sleep study: Obesity, Snoring  Epworth Sleepiness Score: 13   SLEEP STUDY TECHNIQUE  As per the AASM Manual for the Scoring of Sleep and Associated Events v2.3 (April 2016) with a hypopnea requiring 4% desaturations.  The channels recorded and monitored were frontal, central and occipital EEG, electrooculogram (EOG), submentalis EMG (chin), nasal and oral airflow, thoracic and abdominal wall motion, anterior tibialis EMG, snore microphone, electrocardiogram, and pulse oximetry.   MEDICATIONS  Patient's medications include: Medications reviewed per chart review. Medications self-administered by patient during sleep study : No sleep medicine administered.  SLEEP ARCHITECTURE  The study was initiated at 10:29:58 PM and ended at 5:05:06 AM.  Sleep onset time was 28.0 minutes and the sleep efficiency was 85.8%. The total sleep time was 339.1 minutes.  Stage REM latency was 154.0 minutes.  The patient spent 2.95% of the night in stage N1 sleep, 84.22% in stage N2 sleep, 0.15% in stage N3 and 12.68% in REM.  Alpha intrusion was absent.  Supine sleep was 28.79%.   RESPIRATORY PARAMETERS  The overall apnea/hypopnea index (AHI) was 1.2 per hour. There were 0 total apneas, including 0 obstructive, 0 central and 0 mixed apneas. There were 7 hypopneas and 89 RERAs.  The AHI during Stage REM sleep was 4.2 per hour. AHI while supine was 0.6  per hour.  The mean oxygen saturation was 94.51%. The minimum SpO2 during sleep was 90.00%.  Loud snoring was noted during this study.  CARDIAC DATA  The 2 lead EKG demonstrated sinus rhythm. The mean heart rate was 53.41 beats per minute. Other EKG findings include: None.   LEG MOVEMENT DATA  The total PLMS were 115 with a resulting PLMS index of 20.35. Associated arousal with leg movement index was 0.2 .  IMPRESSIONS  No significant obstructive sleep apnea occurred during this study (AHI = 1.2/h). No significant central sleep apnea occurred during this study (CAI = 0.0/h). Moderate oxygen desaturation was noted during this study (Min O2 = 90.00%). The patient snored with Loud snoring volume. No cardiac abnormalities were noted during this study.  Mild periodic limb movements of sleep occurred during the study. No significant associated arousals. DIAGNOSIS  No evidence for significant OSA based on this study. Elevated PLMS index.   RECOMMENDATIONS  There is no significant evidence for OSA based on this study. If hypersomnia is persistent, suggest getting a home sleep test  to cover more hours. Suggest also working up for other causes of hypersomnia. Clinical correlation regarding possible RLS given elevated PLMSI. Avoid alcohol, sedatives and other CNS depressants that may worsen sleep apnea and disrupt normal sleep architecture. Sleep hygiene should be reviewed to assess factors that may improve sleep quality. Weight management and regular exercise should be initiated or continued if appropriate. Follow up in the office as scheduled.  Monica Becton, MD 11/30/2015, 11:19 PM [facility] Pager [phone] After 3 pm or if no answer, call [phone]

## 2015-12-04 NOTE — Telephone Encounter (Signed)
lmtcb x1 for pt. 

## 2015-12-04 NOTE — Telephone Encounter (Signed)
Pt returned call - advised of sleep study results / recs as stated by AD.  Pt voiced her understanding.  Appt scheduled w/ AD for 9.19.17 @ 3:45pm for 3 month follow up per last ov and pt's request.  Nothing further needed; will sign off.

## 2015-12-06 ENCOUNTER — Encounter: Payer: BLUE CROSS/BLUE SHIELD | Admitting: Family Medicine

## 2016-01-14 ENCOUNTER — Other Ambulatory Visit: Payer: Self-pay | Admitting: Family Medicine

## 2016-01-18 ENCOUNTER — Other Ambulatory Visit: Payer: Self-pay | Admitting: Family Medicine

## 2016-01-18 DIAGNOSIS — E039 Hypothyroidism, unspecified: Secondary | ICD-10-CM

## 2016-01-21 ENCOUNTER — Ambulatory Visit: Payer: BLUE CROSS/BLUE SHIELD | Admitting: Pulmonary Disease

## 2016-02-12 ENCOUNTER — Encounter: Payer: Self-pay | Admitting: Family Medicine

## 2016-02-12 ENCOUNTER — Ambulatory Visit: Payer: BLUE CROSS/BLUE SHIELD | Admitting: Pulmonary Disease

## 2016-02-12 ENCOUNTER — Ambulatory Visit (INDEPENDENT_AMBULATORY_CARE_PROVIDER_SITE_OTHER): Payer: BLUE CROSS/BLUE SHIELD | Admitting: Family Medicine

## 2016-02-12 VITALS — BP 118/70 | HR 63 | Temp 98.1°F | Ht 64.0 in | Wt 234.4 lb

## 2016-02-12 DIAGNOSIS — I10 Essential (primary) hypertension: Secondary | ICD-10-CM | POA: Diagnosis not present

## 2016-02-12 DIAGNOSIS — E559 Vitamin D deficiency, unspecified: Secondary | ICD-10-CM

## 2016-02-12 DIAGNOSIS — E039 Hypothyroidism, unspecified: Secondary | ICD-10-CM | POA: Diagnosis not present

## 2016-02-12 DIAGNOSIS — Z0000 Encounter for general adult medical examination without abnormal findings: Secondary | ICD-10-CM

## 2016-02-12 DIAGNOSIS — D229 Melanocytic nevi, unspecified: Secondary | ICD-10-CM

## 2016-02-12 DIAGNOSIS — R1010 Upper abdominal pain, unspecified: Secondary | ICD-10-CM

## 2016-02-12 LAB — COMPREHENSIVE METABOLIC PANEL
ALBUMIN: 4.1 g/dL (ref 3.5–5.2)
ALT: 18 U/L (ref 0–35)
AST: 22 U/L (ref 0–37)
Alkaline Phosphatase: 147 U/L — ABNORMAL HIGH (ref 39–117)
BUN: 16 mg/dL (ref 6–23)
CALCIUM: 9.3 mg/dL (ref 8.4–10.5)
CHLORIDE: 100 meq/L (ref 96–112)
CO2: 32 meq/L (ref 19–32)
CREATININE: 0.63 mg/dL (ref 0.40–1.20)
GFR: 128.24 mL/min (ref >60.00)
Glucose, Bld: 86 mg/dL (ref 70–99)
POTASSIUM: 4.3 meq/L (ref 3.5–5.1)
Sodium: 139 mEq/L (ref 135–145)
Total Bilirubin: 0.4 mg/dL (ref 0.2–1.2)
Total Protein: 7.6 g/dL (ref 6.0–8.3)

## 2016-02-12 LAB — POC URINALSYSI DIPSTICK (AUTOMATED)
BILIRUBIN UA: NEGATIVE
Blood, UA: NEGATIVE
Glucose, UA: NEGATIVE
KETONES UA: NEGATIVE
LEUKOCYTES UA: NEGATIVE
Nitrite, UA: NEGATIVE
Protein, UA: NEGATIVE
SPEC GRAV UA: 1.025
Urobilinogen, UA: 0.2
pH, UA: 6

## 2016-02-12 LAB — CBC WITH DIFFERENTIAL/PLATELET
BASOS ABS: 0 10*3/uL (ref 0.0–0.1)
BASOS PCT: 0.4 % (ref 0.0–3.0)
EOS ABS: 0.1 10*3/uL (ref 0.0–0.7)
Eosinophils Relative: 1 % (ref 0.0–5.0)
HEMATOCRIT: 39.5 % (ref 36.0–46.0)
Hemoglobin: 13.4 g/dL (ref 12.0–15.0)
LYMPHS ABS: 3.3 10*3/uL (ref 0.7–4.0)
LYMPHS PCT: 39.1 % (ref 12.0–46.0)
MCHC: 34 g/dL (ref 30.0–36.0)
MCV: 86.2 fl (ref 78.0–100.0)
Monocytes Absolute: 0.5 10*3/uL (ref 0.1–1.0)
Monocytes Relative: 6.3 % (ref 3.0–12.0)
NEUTROS ABS: 4.4 10*3/uL (ref 1.4–7.7)
NEUTROS PCT: 53.2 % (ref 43.0–77.0)
PLATELETS: 477 10*3/uL — AB (ref 150.0–400.0)
RBC: 4.58 Mil/uL (ref 3.87–5.11)
RDW: 14.4 % (ref 11.5–15.5)
WBC: 8.3 10*3/uL (ref 4.0–10.5)

## 2016-02-12 LAB — LIPID PANEL
CHOL/HDL RATIO: 3
CHOLESTEROL: 189 mg/dL (ref 0–200)
HDL: 59 mg/dL (ref >39.00)
LDL CALC: 116 mg/dL — AB (ref 0–99)
NonHDL: 130.37
TRIGLYCERIDES: 70 mg/dL (ref 0.0–149.0)
VLDL: 14 mg/dL (ref 0.0–40.0)

## 2016-02-12 LAB — VITAMIN D 25 HYDROXY (VIT D DEFICIENCY, FRACTURES): VITD: 43.85 ng/mL (ref 30.00–100.00)

## 2016-02-12 LAB — TSH: TSH: 2 u[IU]/mL (ref 0.35–4.50)

## 2016-02-12 NOTE — Patient Instructions (Signed)
Preventive Care for Adults, Female A healthy lifestyle and preventive care can promote health and wellness. Preventive health guidelines for women include the following key practices.  A routine yearly physical is a good way to check with your health care provider about your health and preventive screening. It is a chance to share any concerns and updates on your health and to receive a thorough exam.  Visit your dentist for a routine exam and preventive care every 6 months. Brush your teeth twice a day and floss once a day. Good oral hygiene prevents tooth decay and gum disease.  The frequency of eye exams is based on your age, health, family medical history, use of contact lenses, and other factors. Follow your health care provider's recommendations for frequency of eye exams.  Eat a healthy diet. Foods like vegetables, fruits, whole grains, low-fat dairy products, and lean protein foods contain the nutrients you need without too many calories. Decrease your intake of foods high in solid fats, added sugars, and salt. Eat the right amount of calories for you.Get information about a proper diet from your health care provider, if necessary.  Regular physical exercise is one of the most important things you can do for your health. Most adults should get at least 150 minutes of moderate-intensity exercise (any activity that increases your heart rate and causes you to sweat) each week. In addition, most adults need muscle-strengthening exercises on 2 or more days a week.  Maintain a healthy weight. The body mass index (BMI) is a screening tool to identify possible weight problems. It provides an estimate of body fat based on height and weight. Your health care provider can find your BMI and can help you achieve or maintain a healthy weight.For adults 20 years and older:  A BMI below 18.5 is considered underweight.  A BMI of 18.5 to 24.9 is normal.  A BMI of 25 to 29.9 is considered overweight.  A  BMI of 30 and above is considered obese.  Maintain normal blood lipids and cholesterol levels by exercising and minimizing your intake of saturated fat. Eat a balanced diet with plenty of fruit and vegetables. Blood tests for lipids and cholesterol should begin at age 45 and be repeated every 5 years. If your lipid or cholesterol levels are high, you are over 50, or you are at high risk for heart disease, you may need your cholesterol levels checked more frequently.Ongoing high lipid and cholesterol levels should be treated with medicines if diet and exercise are not working.  If you smoke, find out from your health care provider how to quit. If you do not use tobacco, do not start.  Lung cancer screening is recommended for adults aged 45-80 years who are at high risk for developing lung cancer because of a history of smoking. A yearly low-dose CT scan of the lungs is recommended for people who have at least a 30-pack-year history of smoking and are a current smoker or have quit within the past 15 years. A pack year of smoking is smoking an average of 1 pack of cigarettes a day for 1 year (for example: 1 pack a day for 30 years or 2 packs a day for 15 years). Yearly screening should continue until the smoker has stopped smoking for at least 15 years. Yearly screening should be stopped for people who develop a health problem that would prevent them from having lung cancer treatment.  If you are pregnant, do not drink alcohol. If you are  breastfeeding, be very cautious about drinking alcohol. If you are not pregnant and choose to drink alcohol, do not have more than 1 drink per day. One drink is considered to be 12 ounces (355 mL) of beer, 5 ounces (148 mL) of wine, or 1.5 ounces (44 mL) of liquor.  Avoid use of street drugs. Do not share needles with anyone. Ask for help if you need support or instructions about stopping the use of drugs.  High blood pressure causes heart disease and increases the risk  of stroke. Your blood pressure should be checked at least every 1 to 2 years. Ongoing high blood pressure should be treated with medicines if weight loss and exercise do not work.  If you are 55-79 years old, ask your health care provider if you should take aspirin to prevent strokes.  Diabetes screening is done by taking a blood sample to check your blood glucose level after you have not eaten for a certain period of time (fasting). If you are not overweight and you do not have risk factors for diabetes, you should be screened once every 3 years starting at age 45. If you are overweight or obese and you are 40-70 years of age, you should be screened for diabetes every year as part of your cardiovascular risk assessment.  Breast cancer screening is essential preventive care for women. You should practice "breast self-awareness." This means understanding the normal appearance and feel of your breasts and may include breast self-examination. Any changes detected, no matter how small, should be reported to a health care provider. Women in their 20s and 30s should have a clinical breast exam (CBE) by a health care provider as part of a regular health exam every 1 to 3 years. After age 40, women should have a CBE every year. Starting at age 40, women should consider having a mammogram (breast X-ray test) every year. Women who have a family history of breast cancer should talk to their health care provider about genetic screening. Women at a high risk of breast cancer should talk to their health care providers about having an MRI and a mammogram every year.  Breast cancer gene (BRCA)-related cancer risk assessment is recommended for women who have family members with BRCA-related cancers. BRCA-related cancers include breast, ovarian, tubal, and peritoneal cancers. Having family members with these cancers may be associated with an increased risk for harmful changes (mutations) in the breast cancer genes BRCA1 and  BRCA2. Results of the assessment will determine the need for genetic counseling and BRCA1 and BRCA2 testing.  Your health care provider may recommend that you be screened regularly for cancer of the pelvic organs (ovaries, uterus, and vagina). This screening involves a pelvic examination, including checking for microscopic changes to the surface of your cervix (Pap test). You may be encouraged to have this screening done every 3 years, beginning at age 21.  For women ages 30-65, health care providers may recommend pelvic exams and Pap testing every 3 years, or they may recommend the Pap and pelvic exam, combined with testing for human papilloma virus (HPV), every 5 years. Some types of HPV increase your risk of cervical cancer. Testing for HPV may also be done on women of any age with unclear Pap test results.  Other health care providers may not recommend any screening for nonpregnant women who are considered low risk for pelvic cancer and who do not have symptoms. Ask your health care provider if a screening pelvic exam is right for   you.  If you have had past treatment for cervical cancer or a condition that could lead to cancer, you need Pap tests and screening for cancer for at least 20 years after your treatment. If Pap tests have been discontinued, your risk factors (such as having a new sexual partner) need to be reassessed to determine if screening should resume. Some women have medical problems that increase the chance of getting cervical cancer. In these cases, your health care provider may recommend more frequent screening and Pap tests.  Colorectal cancer can be detected and often prevented. Most routine colorectal cancer screening begins at the age of 50 years and continues through age 75 years. However, your health care provider may recommend screening at an earlier age if you have risk factors for colon cancer. On a yearly basis, your health care provider may provide home test kits to check  for hidden blood in the stool. Use of a small camera at the end of a tube, to directly examine the colon (sigmoidoscopy or colonoscopy), can detect the earliest forms of colorectal cancer. Talk to your health care provider about this at age 50, when routine screening begins. Direct exam of the colon should be repeated every 5-10 years through age 75 years, unless early forms of precancerous polyps or small growths are found.  People who are at an increased risk for hepatitis B should be screened for this virus. You are considered at high risk for hepatitis B if:  You were born in a country where hepatitis B occurs often. Talk with your health care provider about which countries are considered high risk.  Your parents were born in a high-risk country and you have not received a shot to protect against hepatitis B (hepatitis B vaccine).  You have HIV or AIDS.  You use needles to inject street drugs.  You live with, or have sex with, someone who has hepatitis B.  You get hemodialysis treatment.  You take certain medicines for conditions like cancer, organ transplantation, and autoimmune conditions.  Hepatitis C blood testing is recommended for all people born from 1945 through 1965 and any individual with known risks for hepatitis C.  Practice safe sex. Use condoms and avoid high-risk sexual practices to reduce the spread of sexually transmitted infections (STIs). STIs include gonorrhea, chlamydia, syphilis, trichomonas, herpes, HPV, and human immunodeficiency virus (HIV). Herpes, HIV, and HPV are viral illnesses that have no cure. They can result in disability, cancer, and death.  You should be screened for sexually transmitted illnesses (STIs) including gonorrhea and chlamydia if:  You are sexually active and are younger than 24 years.  You are older than 24 years and your health care provider tells you that you are at risk for this type of infection.  Your sexual activity has changed  since you were last screened and you are at an increased risk for chlamydia or gonorrhea. Ask your health care provider if you are at risk.  If you are at risk of being infected with HIV, it is recommended that you take a prescription medicine daily to prevent HIV infection. This is called preexposure prophylaxis (PrEP). You are considered at risk if:  You are sexually active and do not regularly use condoms or know the HIV status of your partner(s).  You take drugs by injection.  You are sexually active with a partner who has HIV.  Talk with your health care provider about whether you are at high risk of being infected with HIV. If   you choose to begin PrEP, you should first be tested for HIV. You should then be tested every 3 months for as long as you are taking PrEP.  Osteoporosis is a disease in which the bones lose minerals and strength with aging. This can result in serious bone fractures or breaks. The risk of osteoporosis can be identified using a bone density scan. Women ages 67 years and over and women at risk for fractures or osteoporosis should discuss screening with their health care providers. Ask your health care provider whether you should take a calcium supplement or vitamin D to reduce the rate of osteoporosis.  Menopause can be associated with physical symptoms and risks. Hormone replacement therapy is available to decrease symptoms and risks. You should talk to your health care provider about whether hormone replacement therapy is right for you.  Use sunscreen. Apply sunscreen liberally and repeatedly throughout the day. You should seek shade when your shadow is shorter than you. Protect yourself by wearing long sleeves, pants, a wide-brimmed hat, and sunglasses year round, whenever you are outdoors.  Once a month, do a whole body skin exam, using a mirror to look at the skin on your back. Tell your health care provider of new moles, moles that have irregular borders, moles that  are larger than a pencil eraser, or moles that have changed in shape or color.  Stay current with required vaccines (immunizations).  Influenza vaccine. All adults should be immunized every year.  Tetanus, diphtheria, and acellular pertussis (Td, Tdap) vaccine. Pregnant women should receive 1 dose of Tdap vaccine during each pregnancy. The dose should be obtained regardless of the length of time since the last dose. Immunization is preferred during the 27th-36th week of gestation. An adult who has not previously received Tdap or who does not know her vaccine status should receive 1 dose of Tdap. This initial dose should be followed by tetanus and diphtheria toxoids (Td) booster doses every 10 years. Adults with an unknown or incomplete history of completing a 3-dose immunization series with Td-containing vaccines should begin or complete a primary immunization series including a Tdap dose. Adults should receive a Td booster every 10 years.  Varicella vaccine. An adult without evidence of immunity to varicella should receive 2 doses or a second dose if she has previously received 1 dose. Pregnant females who do not have evidence of immunity should receive the first dose after pregnancy. This first dose should be obtained before leaving the health care facility. The second dose should be obtained 4-8 weeks after the first dose.  Human papillomavirus (HPV) vaccine. Females aged 13-26 years who have not received the vaccine previously should obtain the 3-dose series. The vaccine is not recommended for use in pregnant females. However, pregnancy testing is not needed before receiving a dose. If a female is found to be pregnant after receiving a dose, no treatment is needed. In that case, the remaining doses should be delayed until after the pregnancy. Immunization is recommended for any person with an immunocompromised condition through the age of 61 years if she did not get any or all doses earlier. During the  3-dose series, the second dose should be obtained 4-8 weeks after the first dose. The third dose should be obtained 24 weeks after the first dose and 16 weeks after the second dose.  Zoster vaccine. One dose is recommended for adults aged 30 years or older unless certain conditions are present.  Measles, mumps, and rubella (MMR) vaccine. Adults born  before 1957 generally are considered immune to measles and mumps. Adults born in 1957 or later should have 1 or more doses of MMR vaccine unless there is a contraindication to the vaccine or there is laboratory evidence of immunity to each of the three diseases. A routine second dose of MMR vaccine should be obtained at least 28 days after the first dose for students attending postsecondary schools, health care workers, or international travelers. People who received inactivated measles vaccine or an unknown type of measles vaccine during 1963-1967 should receive 2 doses of MMR vaccine. People who received inactivated mumps vaccine or an unknown type of mumps vaccine before 1979 and are at high risk for mumps infection should consider immunization with 2 doses of MMR vaccine. For females of childbearing age, rubella immunity should be determined. If there is no evidence of immunity, females who are not pregnant should be vaccinated. If there is no evidence of immunity, females who are pregnant should delay immunization until after pregnancy. Unvaccinated health care workers born before 1957 who lack laboratory evidence of measles, mumps, or rubella immunity or laboratory confirmation of disease should consider measles and mumps immunization with 2 doses of MMR vaccine or rubella immunization with 1 dose of MMR vaccine.  Pneumococcal 13-valent conjugate (PCV13) vaccine. When indicated, a person who is uncertain of his immunization history and has no record of immunization should receive the PCV13 vaccine. All adults 65 years of age and older should receive this  vaccine. An adult aged 19 years or older who has certain medical conditions and has not been previously immunized should receive 1 dose of PCV13 vaccine. This PCV13 should be followed with a dose of pneumococcal polysaccharide (PPSV23) vaccine. Adults who are at high risk for pneumococcal disease should obtain the PPSV23 vaccine at least 8 weeks after the dose of PCV13 vaccine. Adults older than 51 years of age who have normal immune system function should obtain the PPSV23 vaccine dose at least 1 year after the dose of PCV13 vaccine.  Pneumococcal polysaccharide (PPSV23) vaccine. When PCV13 is also indicated, PCV13 should be obtained first. All adults aged 65 years and older should be immunized. An adult younger than age 65 years who has certain medical conditions should be immunized. Any person who resides in a nursing home or long-term care facility should be immunized. An adult smoker should be immunized. People with an immunocompromised condition and certain other conditions should receive both PCV13 and PPSV23 vaccines. People with human immunodeficiency virus (HIV) infection should be immunized as soon as possible after diagnosis. Immunization during chemotherapy or radiation therapy should be avoided. Routine use of PPSV23 vaccine is not recommended for American Indians, [place] Natives, or people younger than 65 years unless there are medical conditions that require PPSV23 vaccine. When indicated, people who have unknown immunization and have no record of immunization should receive PPSV23 vaccine. One-time revaccination 5 years after the first dose of PPSV23 is recommended for people aged 19-64 years who have chronic kidney failure, nephrotic syndrome, asplenia, or immunocompromised conditions. People who received 1-2 doses of PPSV23 before age 65 years should receive another dose of PPSV23 vaccine at age 65 years or later if at least 5 years have passed since the previous dose. Doses of PPSV23 are not  needed for people immunized with PPSV23 at or after age 65 years.  Meningococcal vaccine. Adults with asplenia or persistent complement component deficiencies should receive 2 doses of quadrivalent meningococcal conjugate (MenACWY-D) vaccine. The doses should be obtained   at least 2 months apart. Microbiologists working with certain meningococcal bacteria, [place] recruits, people at risk during an outbreak, and people who travel to or live in countries with a high rate of meningitis should be immunized. A first-year college student up through age 34 years who is living in a residence hall should receive a dose if she did not receive a dose on or after her 16th birthday. Adults who have certain high-risk conditions should receive one or more doses of vaccine.  Hepatitis A vaccine. Adults who wish to be protected from this disease, have certain high-risk conditions, work with hepatitis A-infected animals, work in hepatitis A research labs, or travel to or work in countries with a high rate of hepatitis A should be immunized. Adults who were previously unvaccinated and who anticipate close contact with an international adoptee during the first 60 days after arrival in the [place] from a country with a high rate of hepatitis A should be immunized.  Hepatitis B vaccine. Adults who wish to be protected from this disease, have certain high-risk conditions, may be exposed to blood or other infectious body fluids, are household contacts or sex partners of hepatitis B positive people, are clients or workers in certain care facilities, or travel to or work in countries with a high rate of hepatitis B should be immunized.  Haemophilus influenzae type b (Hib) vaccine. A previously unvaccinated person with asplenia or sickle cell disease or having a scheduled splenectomy should receive 1 dose of Hib vaccine. Regardless of previous immunization, a recipient of a hematopoietic stem cell transplant should receive a  3-dose series 6-12 months after her successful transplant. Hib vaccine is not recommended for adults with HIV infection. Preventive Services / Frequency Ages 35 to 4 years  Blood pressure check.** / Every 3-5 years.  Lipid and cholesterol check.** / Every 5 years beginning at age 60.  Clinical breast exam.** / Every 3 years for women in their 71s and 10s.  BRCA-related cancer risk assessment.** / For women who have family members with a BRCA-related cancer (breast, ovarian, tubal, or peritoneal cancers).  Pap test.** / Every 2 years from ages 76 through 26. Every 3 years starting at age 61 through age 76 or 93 with a history of 3 consecutive normal Pap tests.  HPV screening.** / Every 3 years from ages 37 through ages 60 to 51 with a history of 3 consecutive normal Pap tests.  Hepatitis C blood test.** / For any individual with known risks for hepatitis C.  Skin self-exam. / Monthly.  Influenza vaccine. / Every year.  Tetanus, diphtheria, and acellular pertussis (Tdap, Td) vaccine.** / Consult your health care provider. Pregnant women should receive 1 dose of Tdap vaccine during each pregnancy. 1 dose of Td every 10 years.  Varicella vaccine.** / Consult your health care provider. Pregnant females who do not have evidence of immunity should receive the first dose after pregnancy.  HPV vaccine. / 3 doses over 6 months, if 93 and younger. The vaccine is not recommended for use in pregnant females. However, pregnancy testing is not needed before receiving a dose.  Measles, mumps, rubella (MMR) vaccine.** / You need at least 1 dose of MMR if you were born in 1957 or later. You may also need a 2nd dose. For females of childbearing age, rubella immunity should be determined. If there is no evidence of immunity, females who are not pregnant should be vaccinated. If there is no evidence of immunity, females who are  pregnant should delay immunization until after pregnancy.  Pneumococcal  13-valent conjugate (PCV13) vaccine.** / Consult your health care provider.  Pneumococcal polysaccharide (PPSV23) vaccine.** / 1 to 2 doses if you smoke cigarettes or if you have certain conditions.  Meningococcal vaccine.** / 1 dose if you are age 68 to 8 years and a Market researcher living in a residence hall, or have one of several medical conditions, you need to get vaccinated against meningococcal disease. You may also need additional booster doses.  Hepatitis A vaccine.** / Consult your health care provider.  Hepatitis B vaccine.** / Consult your health care provider.  Haemophilus influenzae type b (Hib) vaccine.** / Consult your health care provider. Ages 7 to 53 years  Blood pressure check.** / Every year.  Lipid and cholesterol check.** / Every 5 years beginning at age 25 years.  Lung cancer screening. / Every year if you are aged 11-80 years and have a 30-pack-year history of smoking and currently smoke or have quit within the past 15 years. Yearly screening is stopped once you have quit smoking for at least 15 years or develop a health problem that would prevent you from having lung cancer treatment.  Clinical breast exam.** / Every year after age 48 years.  BRCA-related cancer risk assessment.** / For women who have family members with a BRCA-related cancer (breast, ovarian, tubal, or peritoneal cancers).  Mammogram.** / Every year beginning at age 41 years and continuing for as long as you are in good health. Consult with your health care provider.  Pap test.** / Every 3 years starting at age 65 years through age 37 or 70 years with a history of 3 consecutive normal Pap tests.  HPV screening.** / Every 3 years from ages 72 years through ages 60 to 40 years with a history of 3 consecutive normal Pap tests.  Fecal occult blood test (FOBT) of stool. / Every year beginning at age 21 years and continuing until age 5 years. You may not need to do this test if you get  a colonoscopy every 10 years.  Flexible sigmoidoscopy or colonoscopy.** / Every 5 years for a flexible sigmoidoscopy or every 10 years for a colonoscopy beginning at age 35 years and continuing until age 48 years.  Hepatitis C blood test.** / For all people born from [birth year] through 1965 and any individual with known risks for hepatitis C.  Skin self-exam. / Monthly.  Influenza vaccine. / Every year.  Tetanus, diphtheria, and acellular pertussis (Tdap/Td) vaccine.** / Consult your health care provider. Pregnant women should receive 1 dose of Tdap vaccine during each pregnancy. 1 dose of Td every 10 years.  Varicella vaccine.** / Consult your health care provider. Pregnant females who do not have evidence of immunity should receive the first dose after pregnancy.  Zoster vaccine.** / 1 dose for adults aged 30 years or older.  Measles, mumps, rubella (MMR) vaccine.** / You need at least 1 dose of MMR if you were born in 1957 or later. You may also need a second dose. For females of childbearing age, rubella immunity should be determined. If there is no evidence of immunity, females who are not pregnant should be vaccinated. If there is no evidence of immunity, females who are pregnant should delay immunization until after pregnancy.  Pneumococcal 13-valent conjugate (PCV13) vaccine.** / Consult your health care provider.  Pneumococcal polysaccharide (PPSV23) vaccine.** / 1 to 2 doses if you smoke cigarettes or if you have certain conditions.  Meningococcal vaccine.** /  Consult your health care provider.  Hepatitis A vaccine.** / Consult your health care provider.  Hepatitis B vaccine.** / Consult your health care provider.  Haemophilus influenzae type b (Hib) vaccine.** / Consult your health care provider. Ages 64 years and over  Blood pressure check.** / Every year.  Lipid and cholesterol check.** / Every 5 years beginning at age 23 years.  Lung cancer screening. / Every year if you  are aged 16-80 years and have a 30-pack-year history of smoking and currently smoke or have quit within the past 15 years. Yearly screening is stopped once you have quit smoking for at least 15 years or develop a health problem that would prevent you from having lung cancer treatment.  Clinical breast exam.** / Every year after age 74 years.  BRCA-related cancer risk assessment.** / For women who have family members with a BRCA-related cancer (breast, ovarian, tubal, or peritoneal cancers).  Mammogram.** / Every year beginning at age 44 years and continuing for as long as you are in good health. Consult with your health care provider.  Pap test.** / Every 3 years starting at age 58 years through age 22 or 39 years with 3 consecutive normal Pap tests. Testing can be stopped between 65 and 70 years with 3 consecutive normal Pap tests and no abnormal Pap or HPV tests in the past 10 years.  HPV screening.** / Every 3 years from ages 64 years through ages 70 or 61 years with a history of 3 consecutive normal Pap tests. Testing can be stopped between 65 and 70 years with 3 consecutive normal Pap tests and no abnormal Pap or HPV tests in the past 10 years.  Fecal occult blood test (FOBT) of stool. / Every year beginning at age 40 years and continuing until age 27 years. You may not need to do this test if you get a colonoscopy every 10 years.  Flexible sigmoidoscopy or colonoscopy.** / Every 5 years for a flexible sigmoidoscopy or every 10 years for a colonoscopy beginning at age 7 years and continuing until age 32 years.  Hepatitis C blood test.** / For all people born from [birth year] through 1966 and any individual with known risks for hepatitis C.  Osteoporosis screening.** / A one-time screening for women ages 30 years and over and women at risk for fractures or osteoporosis.  Skin self-exam. / Monthly.  Influenza vaccine. / Every year.  Tetanus, diphtheria, and acellular pertussis (Tdap/Td)  vaccine.** / 1 dose of Td every 10 years.  Varicella vaccine.** / Consult your health care provider.  Zoster vaccine.** / 1 dose for adults aged 35 years or older.  Pneumococcal 13-valent conjugate (PCV13) vaccine.** / Consult your health care provider.  Pneumococcal polysaccharide (PPSV23) vaccine.** / 1 dose for all adults aged 46 years and older.  Meningococcal vaccine.** / Consult your health care provider.  Hepatitis A vaccine.** / Consult your health care provider.  Hepatitis B vaccine.** / Consult your health care provider.  Haemophilus influenzae type b (Hib) vaccine.** / Consult your health care provider. ** Family history and personal history of risk and conditions may change your health care provider's recommendations.   This information is not intended to replace advice given to you by your health care provider. Make sure you discuss any questions you have with your health care provider.   Document Released: 07/08/2001 Document Revised: 06/02/2014 Document Reviewed: 10/07/2010 Elsevier Interactive Patient Education [employer].

## 2016-02-12 NOTE — Progress Notes (Signed)
Pre visit review using our clinic review tool, if applicable. No additional management support is needed unless otherwise documented below in the visit note. 

## 2016-02-12 NOTE — Progress Notes (Signed)
Subjective:     [name] is a 50 y.o. female and is here for a comprehensive physical exam. The patient reports no problems. She has some moles she would like me to look at and f/u on bp.    Social History   Social History  . Marital status: Married    Spouse name: N/A  . Number of children: 3  . Years of education: N/A   Occupational History  . 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 Smoking status: Never Smoker  . Smokeless tobacco: Never Used     Comment: Never Used Tobacco  . Alcohol use No  . Drug use: No  . Sexual activity: Yes    Partners: Male   Other Topics Concern  . Not on file   Social History Narrative  . No narrative on file   Health Maintenance  Topic Date Due  . INFLUENZA VACCINE  04/29/2016 (Originally 12/25/2015)  . MAMMOGRAM  11/01/2016  . COLONOSCOPY  02/21/2017  . TETANUS/TDAP  02/08/2019  . HIV Screening  Completed    The following portions of the patient's history were reviewed and updated as appropriate:  She  has a past medical history of Allergic rhinitis, cause unspecified; Anemia, iron deficiency; COPD (chronic obstructive pulmonary disease) ([place]); Diastolic dysfunction; Diverticulosis of colon; GERD (gastroesophageal reflux disease); Goiter; Hiatal hernia; Hypertension; IBS (irritable bowel syndrome); Morbid obesity ([place]); Obstructive sleep apnea; Thrombocytosis ([place]); and Undiagnosed cardiac murmurs. She  does not have any pertinent problems on file. She  has a past surgical history that includes Lipoma excision; Endometrial ablation (12/08); Bariatric Surgery (06/17/2010); Spine surgery (02/10/2012); Laparoscopic assisted vaginal hysterectomy (N/A, 06/22/2014); Bilateral salpingectomy (Bilateral, 06/22/2014); and Cystoscopy (N/A, 06/22/2014). Her family history includes Arrhythmia in her mother; Breast cancer in her mother; Cancer in her father; Cancer (age of onset: 63) in her maternal aunt; Cancer (age of onset: 29) in her  mother; Colon polyps in her father; Diabetes Mellitus II in her mother; Heart disease in her mother; Hypertension in her father; Kidney cancer in her father; Lymphoma in her father. She  reports that she has never smoked. She has never used smokeless tobacco. She reports that she does not drink alcohol or use drugs. She has a current medication list which includes the following prescription(s): cyanocobalamin, echinacea, flax seed oil, glucosamine sulfate, hydrochlorothiazide, ibuprofen, ipratropium, levothyroxine, loratadine, losartan, magnesium, omeprazole, potassium chloride sa, vitamin d (ergocalciferol), and vitamin e. Current Outpatient Prescriptions on File Prior to Visit  Medication Sig Dispense Refill  . Cyanocobalamin (VITAMIN B-12 PO) Take 1 tablet by mouth daily.    . Echinacea 500 MG CAPS Take 500 mg by mouth 2 (two) times daily as needed.    . Flaxseed, Linseed, (FLAX SEED OIL) 1000 MG CAPS Take 1,000 mg by mouth daily.    . Glucosamine Sulfate 1000 MG CAPS Take by mouth.    . hydrochlorothiazide (HYDRODIURIL) 25 MG tablet TAKE 1 TABLET (25 MG TOTAL) BY MOUTH DAILY. 90 tablet 1  . ibuprofen (ADVIL,MOTRIN) 600 MG tablet 1  po  pc  every 6 hours x 5 days then as needed for pain 30 tablet 1  . ipratropium (ATROVENT) 0.03 % nasal spray     . levothyroxine (SYNTHROID, LEVOTHROID) 88 MCG tablet TAKE 1 TABLET BY MOUTH EVERY DAY 30 tablet 6  . loratadine (CLARITIN) 10 MG tablet Take 1 tablet (10 mg total) by mouth daily as needed. 90 tablet 3  . losartan (COZAAR) 100 MG tablet Take 1  tablet (100 mg total) by mouth daily. 90 tablet 3  . magnesium 30 MG tablet Take 30 mg by mouth 2 (two) times daily.    Marland Kitchen omeprazole (PRILOSEC) 40 MG capsule Take 1 capsule (40 mg total) by mouth daily. 30 capsule 3  . potassium chloride SA (KLOR-CON M20) 20 MEQ tablet Take 1 tablet (20 mEq total) by mouth daily. 90 tablet 1  . Vitamin D, Ergocalciferol, (DRISDOL) 50000 units CAPS capsule TAKE 1 CAPSULE BY MOUTH  EVERY 7 DAYS 12 capsule 1  . vitamin E 200 UNIT capsule Take 200 Units by mouth daily. Reported on 10/05/2015     No current facility-administered medications on file prior to visit.    She is allergic to penicillins..  Review of Systems Review of Systems  Constitutional: Negative for activity change, appetite change and fatigue.  HENT: Negative for hearing loss, congestion, tinnitus and ear discharge.   Eyes: Negative for visual disturbance (see optho q1y -- vision corrected to 20/20 with glasses).  Respiratory: Negative for cough, chest tightness and shortness of breath.   Cardiovascular: Negative for chest pain, palpitations and leg swelling.  Gastrointestinal: Negative for abdominal pain, diarrhea, constipation and abdominal distention.  Genitourinary: Negative for urgency, frequency, decreased urine volume and difficulty urinating.  Musculoskeletal: Negative for back pain, arthralgias and gait problem.  Skin: Negative for color change, pallor and rash.  Neurological: Negative for dizziness, light-headedness, numbness and headaches.  Hematological: Negative for adenopathy. Does not bruise/bleed easily.  Psychiatric/Behavioral: Negative for suicidal ideas, confusion, sleep disturbance, self-injury, dysphoric mood, decreased concentration and agitation.       Objective:    BP 118/70 (BP Location: Left Arm, Patient Position: Sitting, Cuff Size: Large)   Pulse 63   Temp 98.1 F (36.7 C) (Oral)   Ht 5\' 4"  (1.626 m)   Wt 234 lb 6.4 oz (106.3 kg)   LMP 01/20/2011   SpO2 99%   BMI 40.23 kg/m  General appearance: alert, cooperative, appears stated age and no distress Head: Normocephalic, without obvious abnormality, atraumatic Eyes: conjunctivae/corneas clear. PERRL, EOM's intact. Fundi benign. Ears: normal TM's and external ear canals both ears Nose: Nares normal. Septum midline. Mucosa normal. No drainage or sinus tenderness. Throat: lips, mucosa, and tongue normal; teeth and  gums normal Neck: no adenopathy, no carotid bruit, no JVD, supple, symmetrical, trachea midline and thyroid not enlarged, symmetric, no tenderness/mass/nodules Back: symmetric, no curvature. ROM normal. No CVA tenderness. Lungs: clear to auscultation bilaterally Breasts: gyn Heart: regular rate and rhythm, S1, S2 normal, no murmur, click, rub or gallop Abdomen: soft, non-tender; bowel sounds normal; no masses,  no organomegaly Pelvic: deferred--gyn Extremities: extremities normal, atraumatic, no cyanosis or edema Pulses: 2+ and symmetric Skin: Skin color, texture, turgor normal. No rashes or lesions Lymph nodes: Cervical, supraclavicular, and axillary nodes normal. Neurologic: Alert and oriented X 3, normal strength and tone. Normal symmetric reflexes. Normal coordination and gait   skin-- hyperpigmentation L arm-- about 1-2 cm  Assessment:    Healthy female exam.      Plan:    ghm utd Check labs See After Visit Summary for Counseling Recommendations   1. Preventative health care See above - Comprehensive metabolic panel - Lipid panel - CBC with Differential/Platelet - TSH - POCT Urinalysis Dipstick (Automated)  2. Essential hypertension Stable--con't meds - Comprehensive metabolic panel - Lipid panel - CBC with Differential/Platelet - potassium chloride SA (KLOR-CON M20) 20 MEQ tablet; Take 1 tablet (20 mEq total) by mouth daily.  Dispense: 90  tablet; Refill: 1 - losartan (COZAAR) 100 MG tablet; Take 1 tablet (100 mg total) by mouth daily.  Dispense: 90 tablet; Refill: 3 - hydrochlorothiazide (HYDRODIURIL) 25 MG tablet; Take 1 tablet (25 mg total) by mouth daily.  Dispense: 90 tablet; Refill: 1  3. Hypothyroidism, unspecified hypothyroidism type Check labs - TSH - levothyroxine (SYNTHROID, LEVOTHROID) 88 MCG tablet; Take 1 tablet (88 mcg total) by mouth daily.  Dispense: 30 tablet; Refill: 6  4. Vitamin D deficiency   - Vitamin D (25 hydroxy)  5. Suspicious  nevus   - Ambulatory referral to Dermatology  6. Pain of upper abdomen   - omeprazole (PRILOSEC) 40 MG capsule; Take 1 capsule (40 mg total) by mouth daily.  Dispense: 30 capsule; Refill: 3

## 2016-02-13 MED ORDER — OMEPRAZOLE 40 MG PO CPDR: 40.0000 mg | DELAYED_RELEASE_CAPSULE | Freq: Every day | ORAL | 3 refills | Status: DC

## 2016-02-13 MED ORDER — POTASSIUM CHLORIDE CRYS ER 20 MEQ PO TBCR: 20.0000 meq | EXTENDED_RELEASE_TABLET | Freq: Every day | ORAL | 1 refills | Status: DC

## 2016-02-13 MED ORDER — LOSARTAN POTASSIUM 100 MG PO TABS: 100.0000 mg | ORAL_TABLET | Freq: Every day | ORAL | 3 refills | Status: DC

## 2016-02-13 MED ORDER — HYDROCHLOROTHIAZIDE 25 MG PO TABS: 25.0000 mg | ORAL_TABLET | Freq: Every day | ORAL | 1 refills | Status: DC

## 2016-02-13 MED ORDER — LEVOTHYROXINE SODIUM 88 MCG PO TABS: 88.0000 ug | ORAL_TABLET | Freq: Every day | ORAL | 6 refills | Status: DC

## 2016-02-15 ENCOUNTER — Other Ambulatory Visit: Payer: Self-pay | Admitting: Family Medicine

## 2016-02-15 ENCOUNTER — Encounter: Payer: Self-pay | Admitting: Family Medicine

## 2016-02-15 DIAGNOSIS — R300 Dysuria: Secondary | ICD-10-CM

## 2016-02-18 ENCOUNTER — Other Ambulatory Visit (INDEPENDENT_AMBULATORY_CARE_PROVIDER_SITE_OTHER): Payer: BLUE CROSS/BLUE SHIELD

## 2016-02-18 ENCOUNTER — Other Ambulatory Visit: Payer: Self-pay | Admitting: Family Medicine

## 2016-02-18 DIAGNOSIS — R8290 Unspecified abnormal findings in urine: Secondary | ICD-10-CM | POA: Diagnosis not present

## 2016-02-18 DIAGNOSIS — N390 Urinary tract infection, site not specified: Secondary | ICD-10-CM

## 2016-02-18 LAB — POCT URINALYSIS DIPSTICK
Bilirubin, UA: NEGATIVE
Blood, UA: NEGATIVE
Glucose, UA: NEGATIVE
Ketones, UA: NEGATIVE
Nitrite, UA: NEGATIVE
PH UA: 6
Protein, UA: NEGATIVE
Spec Grav, UA: 1.015
Urobilinogen, UA: NEGATIVE

## 2016-02-18 MED ORDER — CIPROFLOXACIN HCL 250 MG PO TABS: 250.0000 mg | ORAL_TABLET | Freq: Two times a day (BID) | ORAL | 0 refills | Status: DC

## 2016-02-18 NOTE — Telephone Encounter (Signed)
She can leave urine sample today

## 2016-02-18 NOTE — Telephone Encounter (Signed)
Urine left, moderate leukocytes and send for culture. Patient is having urinary discomfort. Please advise    KP

## 2016-02-20 LAB — URINE CULTURE

## 2016-02-21 ENCOUNTER — Other Ambulatory Visit: Payer: Self-pay | Admitting: Family Medicine

## 2016-02-21 DIAGNOSIS — N390 Urinary tract infection, site not specified: Secondary | ICD-10-CM

## 2016-02-21 MED ORDER — CIPROFLOXACIN HCL 250 MG PO TABS
250.0000 mg | ORAL_TABLET | Freq: Two times a day (BID) | ORAL | 0 refills | Status: DC
Start: 2016-02-21 — End: 2016-02-22

## 2016-02-22 ENCOUNTER — Other Ambulatory Visit: Payer: Self-pay

## 2016-02-22 DIAGNOSIS — N390 Urinary tract infection, site not specified: Secondary | ICD-10-CM

## 2016-02-22 MED ORDER — CIPROFLOXACIN HCL 250 MG PO TABS: 250.0000 mg | ORAL_TABLET | Freq: Two times a day (BID) | ORAL | 0 refills | Status: DC

## 2016-03-04 ENCOUNTER — Other Ambulatory Visit: Payer: Self-pay | Admitting: Family Medicine

## 2016-03-04 DIAGNOSIS — R1010 Upper abdominal pain, unspecified: Secondary | ICD-10-CM

## 2016-03-11 IMAGING — CT CT ABD-PELV W/ CM
4 of 9 series · 12 of 46 positions shown, 17 images · IV contrast (APPLIED)
Comparison: CT the abdomen and pelvis 07/14/2006.

CLINICAL DATA: 48-year-old female with history of abdominal pain
for the past 2 months. Elevated liver enzymes. Left-sided
supraclavicular knot palpable on physical examination. Fatigue.

EXAM:
CT CHEST, ABDOMEN, AND PELVIS WITH CONTRAST
TECHNIQUE: Multidetector CT imaging of the chest, abdomen and pelvis was
performed following the standard protocol during bolus
administration of intravenous contrast.
CONTRAST:  100mL OMNIPAQUE IOHEXOL 300 MG/ML  SOLN

[Series 5: chest/abd/pelv 2.0 sagittal · sagittal · 0.78mm/px · 1 of 249 slices shown]
[im 125/249  soft-tissue]
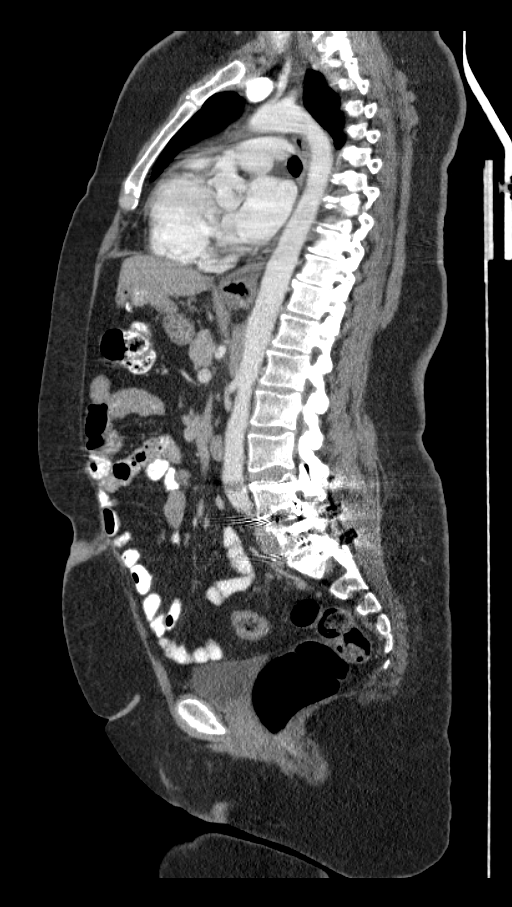

[Series 8: neck 2.0 b31f st · axial · 0.43mm/px · z∈[-222,-62]mm · 6 of 112 slices shown, 11 images]
[im 16/112  soft-tissue]
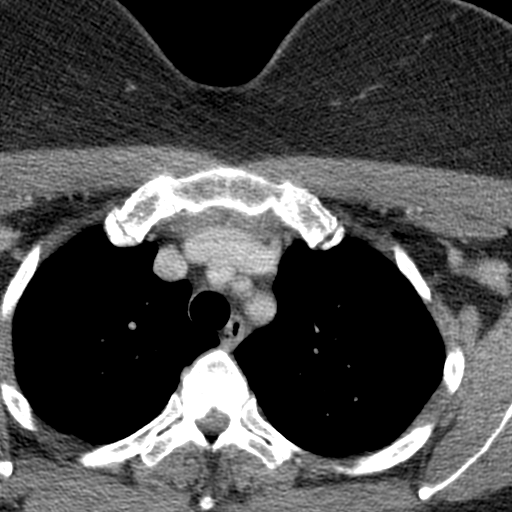
[im 16/112  bone]
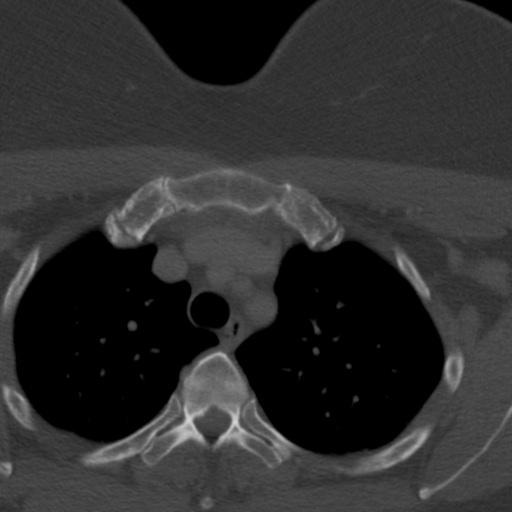
[im 32/112  soft-tissue]
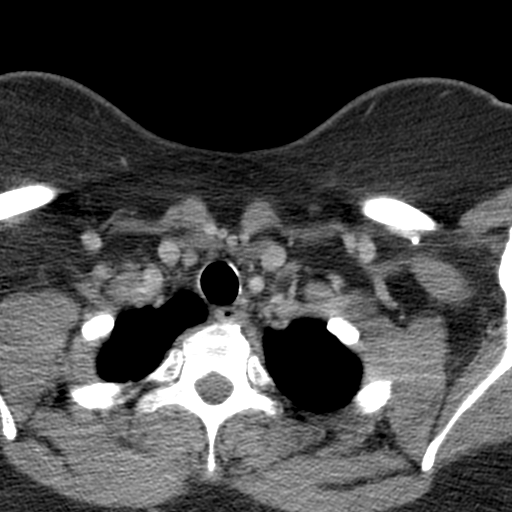
[im 48/112  soft-tissue]
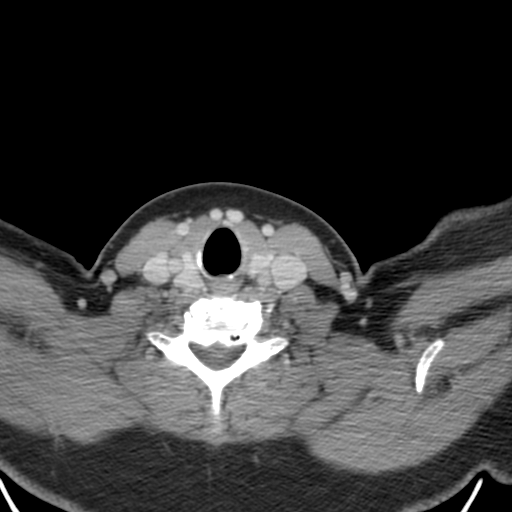
[im 48/112  lung]
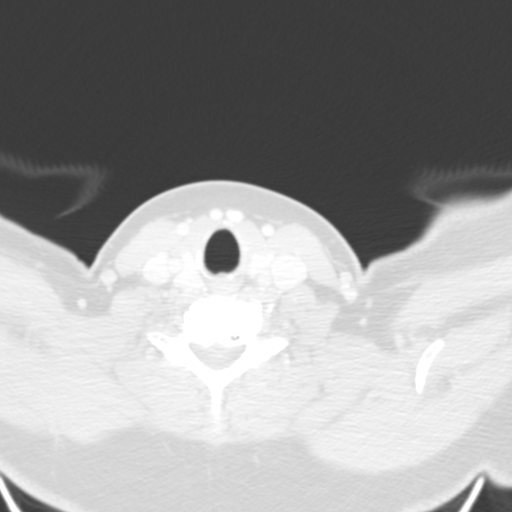
[im 64/112  soft-tissue]
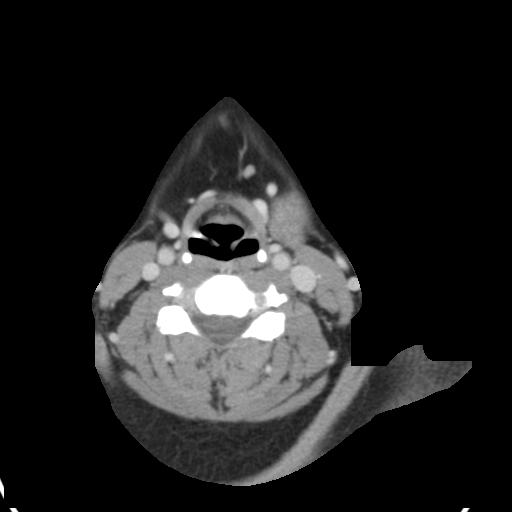
[im 64/112  lung]
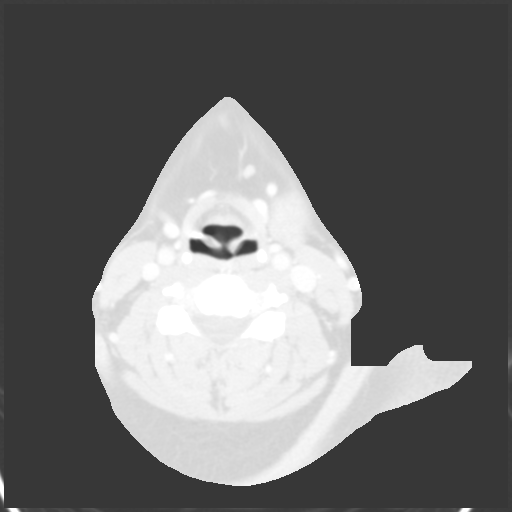
[im 80/112  soft-tissue]
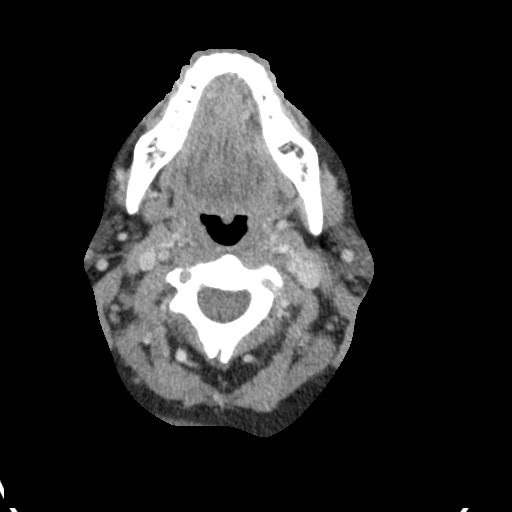
[im 80/112  lung]
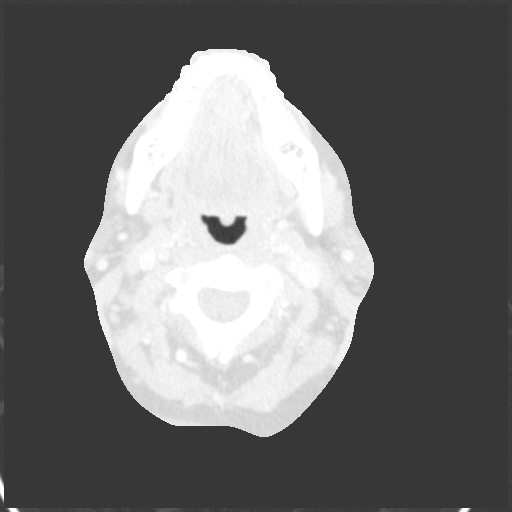
[im 96/112  soft-tissue]
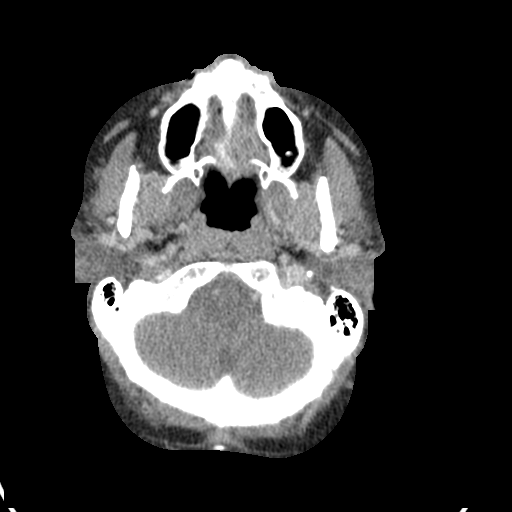
[im 96/112  lung]
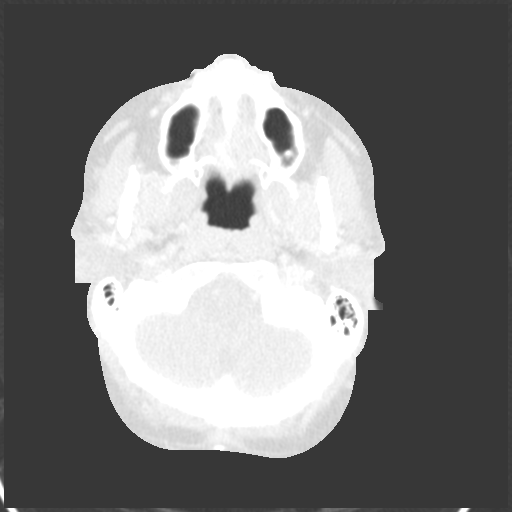

[Series 9: neck 2.0 coronal · coronal · 0.54mm/px · 3 of 143 slices shown]
[im 36/143  soft-tissue]
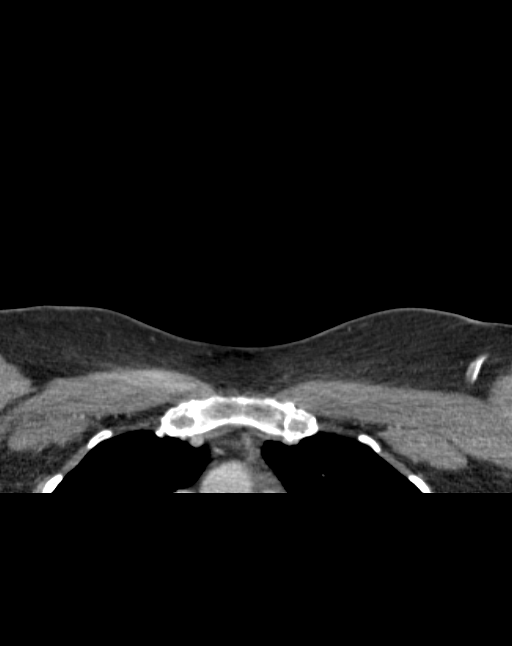
[im 72/143  soft-tissue]
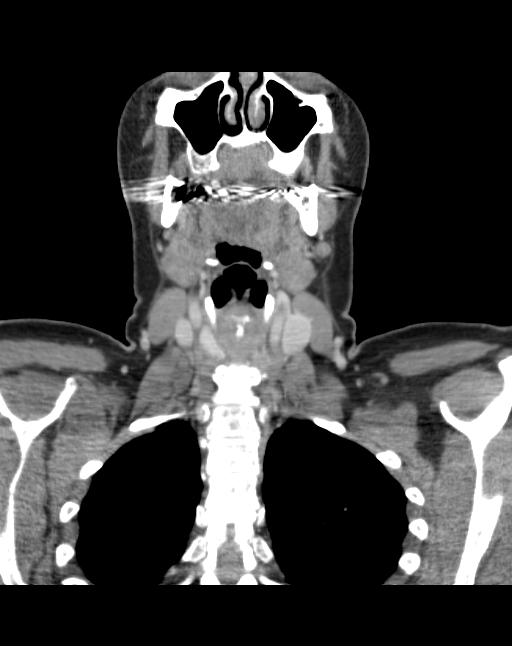
[im 107/143  soft-tissue]
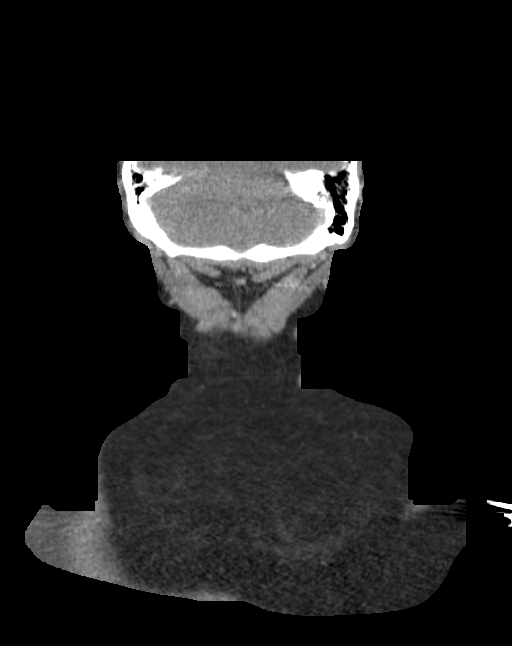

[Series 12: neck 2.0 orth axial to hyoid · axial · 0.43mm/px · z∈[-222,-190]mm · 2 of 112 slices shown]
[im 16/112  soft-tissue]
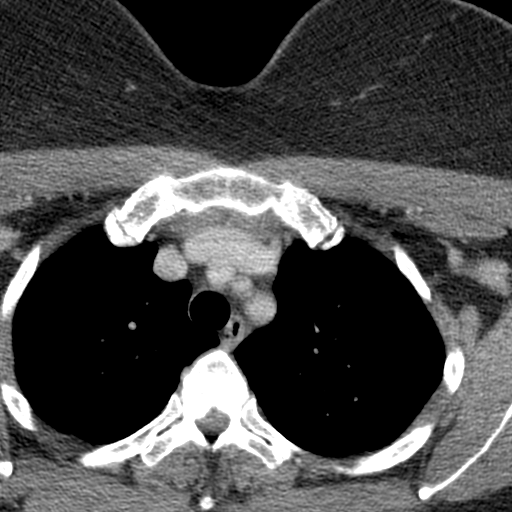
[im 32/112  soft-tissue]
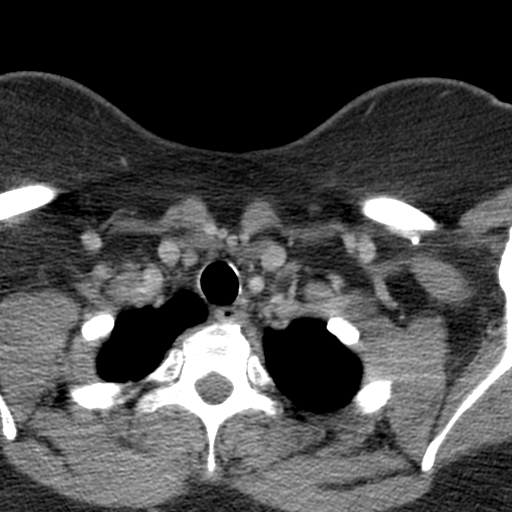

[12 of 46 positions shown; findings below may reference images not displayed]

FINDINGS: CT CHEST FINDINGS

Mediastinum/Nodes: Heart size is normal. There is no significant
pericardial fluid, thickening or pericardial calcification. No
pathologically enlarged mediastinal or hilar lymph nodes. Small
hiatal hernia. No axillary or supraclavicular lymphadenopathy.

Lungs/Pleura: No suspicious appearing pulmonary nodules or masses.
No acute consolidative airspace disease. No pleural effusions.

Musculoskeletal: There are no aggressive appearing lytic or blastic
lesions noted in the visualized portions of the skeleton.

CT ABDOMEN PELVIS FINDINGS

Hepatobiliary: No cystic or solid hepatic lesions. No intra or
extrahepatic biliary ductal dilatation. Gallbladder is normal in
appearance.

Pancreas: No pancreatic mass. No pancreatic ductal dilatation. No
pancreatic or peripancreatic fluid or inflammatory changes.

Spleen: Small calcified granulomas in the spleen.

Adrenals/Urinary Tract: Bilateral kidneys and bilateral adrenal
glands are normal in appearance. No hydroureteronephrosis or
perinephric stranding. Urinary bladder is normal in appearance.

Stomach/Bowel: Postoperative changes of Roux-en-Y gastric bypass are
noted. No pathologic dilatation of small bowel or colon. Normal
appendix.

Vascular/Lymphatic: No significant atherosclerotic disease, aneurysm
or dissection identified in the abdominal or pelvic vasculature. No
lymphadenopathy noted in the abdomen or pelvis.

Reproductive: Status post hysterectomy.  Ovaries are unremarkable.

Other: No significant volume of ascites.  No pneumoperitoneum.

Musculoskeletal: Status post PLIF at L4-S1 with interbody grafts at
L4-L5 and L5-S1. Status post laminectomy at L5. There are no
aggressive appearing lytic or blastic lesions noted in the
visualized portions of the skeleton.
IMPRESSION: 1. No lymphadenopathy or other definite signs of malignancy in the
chest, abdomen or pelvis.
2. No acute findings in the chest, abdomen or pelvis.
3. Normal appendix.
4. Postoperative changes, as above.

## 2016-03-17 ENCOUNTER — Ambulatory Visit: Payer: BLUE CROSS/BLUE SHIELD | Admitting: Pulmonary Disease

## 2016-04-11 ENCOUNTER — Ambulatory Visit: Payer: BLUE CROSS/BLUE SHIELD | Admitting: Pulmonary Disease

## 2016-04-21 ENCOUNTER — Ambulatory Visit (HOSPITAL_BASED_OUTPATIENT_CLINIC_OR_DEPARTMENT_OTHER): Payer: BLUE CROSS/BLUE SHIELD | Admitting: Family

## 2016-04-21 ENCOUNTER — Other Ambulatory Visit (HOSPITAL_BASED_OUTPATIENT_CLINIC_OR_DEPARTMENT_OTHER): Payer: BLUE CROSS/BLUE SHIELD

## 2016-04-21 VITALS — BP 135/72 | HR 81 | Temp 97.5°F | Resp 18 | Wt 239.0 lb

## 2016-04-21 DIAGNOSIS — D75839 Thrombocytosis, unspecified: Secondary | ICD-10-CM

## 2016-04-21 DIAGNOSIS — Z807 Family history of other malignant neoplasms of lymphoid, hematopoietic and related tissues: Secondary | ICD-10-CM | POA: Diagnosis not present

## 2016-04-21 DIAGNOSIS — R7989 Other specified abnormal findings of blood chemistry: Secondary | ICD-10-CM

## 2016-04-21 DIAGNOSIS — D473 Essential (hemorrhagic) thrombocythemia: Secondary | ICD-10-CM

## 2016-04-21 LAB — CBC WITH DIFFERENTIAL (CANCER CENTER ONLY)
BASO#: 0 10*3/uL (ref 0.0–0.2)
BASO%: 0.4 % (ref 0.0–2.0)
EOS ABS: 0.1 10*3/uL (ref 0.0–0.5)
EOS%: 1 % (ref 0.0–7.0)
HEMATOCRIT: 37.9 % (ref 34.8–46.6)
HGB: 12.7 g/dL (ref 11.6–15.9)
LYMPH#: 3.2 10*3/uL (ref 0.9–3.3)
LYMPH%: 44.4 % (ref 14.0–48.0)
MCH: 29.5 pg (ref 26.0–34.0)
MCHC: 33.5 g/dL (ref 32.0–36.0)
MCV: 88 fL (ref 81–101)
MONO#: 0.5 10*3/uL (ref 0.1–0.9)
MONO%: 6.4 % (ref 0.0–13.0)
NEUT#: 3.4 10*3/uL (ref 1.5–6.5)
NEUT%: 47.8 % (ref 39.6–80.0)
PLATELETS: 453 10*3/uL — AB (ref 145–400)
RBC: 4.31 10*6/uL (ref 3.70–5.32)
RDW: 13.8 % (ref 11.1–15.7)
WBC: 7.1 10*3/uL (ref 3.9–10.0)

## 2016-04-21 LAB — COMPREHENSIVE METABOLIC PANEL
ALBUMIN: 3.6 g/dL (ref 3.5–5.0)
ALK PHOS: 164 U/L — AB (ref 40–150)
ALT: 15 U/L (ref 0–55)
ANION GAP: 10 meq/L (ref 3–11)
AST: 20 U/L (ref 5–34)
BILIRUBIN TOTAL: 0.36 mg/dL (ref 0.20–1.20)
BUN: 14.5 mg/dL (ref 7.0–26.0)
CO2: 27 mEq/L (ref 22–29)
Calcium: 8.9 mg/dL (ref 8.4–10.4)
Chloride: 102 mEq/L (ref 98–109)
Creatinine: 0.7 mg/dL (ref 0.6–1.1)
GLUCOSE: 91 mg/dL (ref 70–140)
POTASSIUM: 3.3 meq/L — AB (ref 3.5–5.1)
SODIUM: 139 meq/L (ref 136–145)
Total Protein: 7.4 g/dL (ref 6.4–8.3)

## 2016-04-21 LAB — LACTATE DEHYDROGENASE: LDH: 194 U/L (ref 125–245)

## 2016-04-21 NOTE — Progress Notes (Signed)
Hematology and Oncology Follow Up Visit  [name] [id number] 1966/05/20 50 y.o. 04/21/2016   Principle Diagnosis:  1. Thrombocytosis 2. Family history of lymphoma (father)  Current Therapy:  Observation    Interim History:  [name] is here today for a follow-up. She is doing well and has no new complaints at this time. She is followed by rheumatology and sees them again on December 4th. She is still having joint aches and pains. She has occasional swelling in her elbows and knees. She takes Glucosamine PO daily.  She has back pain due to chronic cervical disc and endplate degeneration at C6-C7. This causes her to have numbness and tingling in her fingertips that comes and goes.  No swelling in her extremities at this time.  Her platelet count is 453. No anemia. No episodes of bleeding, bruising or petechiae.  She has had no issue with infections. No fever, chills, n/v, cough, rash, dizziness, headache, vision changes, chest pain, palpitations, abdominal pain or changes in bowel or bladder habits.  No lymphadenopathy found on exam.  Her appetite comes and goes and she often has to remind herself to eat. She is staying hydrated. Her weight is stable.   Medications:    Medication List       Accurate as of 04/21/16  8:35 AM. Always use your most recent med list.          ciprofloxacin 250 MG tablet Commonly known as:  CIPRO Take 1 tablet (250 mg total) by mouth 2 (two) times daily.   Echinacea 500 MG Caps Take 500 mg by mouth 2 (two) times daily as needed.   Flax Seed Oil 1000 MG Caps Take 1,000 mg by mouth daily.   Glucosamine Sulfate 1000 MG Caps Take by mouth.   hydrochlorothiazide 25 MG tablet Commonly known as:  HYDRODIURIL Take 1 tablet (25 mg total) by mouth daily.   ibuprofen 600 MG tablet Commonly known as:  ADVIL,MOTRIN 1  po  pc  every 6 hours x 5 days then as needed for pain   ipratropium 0.03 % nasal spray Commonly known as:  ATROVENT     levothyroxine 88 MCG tablet Commonly known as:  SYNTHROID, LEVOTHROID Take 1 tablet (88 mcg total) by mouth daily.   loratadine 10 MG tablet Commonly known as:  CLARITIN Take 1 tablet (10 mg total) by mouth daily as needed.   losartan 100 MG tablet Commonly known as:  COZAAR Take 1 tablet (100 mg total) by mouth daily.   magnesium 30 MG tablet Take 30 mg by mouth 2 (two) times daily.   omeprazole 40 MG capsule Commonly known as:  PRILOSEC Take 1 capsule (40 mg total) by mouth daily.   potassium chloride SA 20 MEQ tablet Commonly known as:  KLOR-CON M20 Take 1 tablet (20 mEq total) by mouth daily.   VITAMIN B-12 PO Take 1 tablet by mouth daily.   Vitamin D (Ergocalciferol) 50000 units Caps capsule Commonly known as:  DRISDOL TAKE 1 CAPSULE BY MOUTH EVERY 7 DAYS   vitamin E 200 UNIT capsule Take 200 Units by mouth daily. Reported on 10/05/2015       Allergies:  Allergies  Allergen Reactions  . Penicillins Other (See Comments)    Causes severe, difficult to treat yeast infections    Past Medical History, Surgical history, Social history, and Family History were reviewed and updated.  Review of Systems: All other 10 point review of systems is negative.   Physical Exam:  vitals  were not taken for this visit.  Wt Readings from Last 3 Encounters:  02/12/16 234 lb 6.4 oz (106.3 kg)  11/22/15 239 lb (108.4 kg)  11/09/15 237 lb (107.5 kg)    Ocular: Sclerae unicteric, pupils equal, round and reactive to light Ear-nose-throat: Oropharynx clear, dentition fair Lymphatic: No supraclavicular cervical or axillary adenopathy Lungs no rales or rhonchi, good excursion bilaterally Heart regular rate and rhythm, no murmur appreciated Abd soft, nontender, positive bowel sounds, no liver or spleen tip palpated on exam, no fluid wave MSK no focal spinal tenderness, no joint edema Neuro: non-focal, well-oriented, appropriate affect Breasts: Deferred  Lab Results   Component Value Date   WBC 8.3 02/12/2016   HGB 13.4 02/12/2016   HCT 39.5 02/12/2016   MCV 86.2 02/12/2016   PLT 477.0 (H) 02/12/2016   Lab Results  Component Value Date   FERRITIN 43 04/30/2015   IRON 93 04/30/2015   TIBC 296 04/30/2015   UIBC 204 04/30/2015   IRONPCTSAT 31 04/30/2015   Lab Results  Component Value Date   RETICCTPCT 1.8 04/19/2014   RBC 4.58 02/12/2016   RETICCTABS 79.2 04/19/2014   No results found for: KPAFRELGTCHN, LAMBDASER, KAPLAMBRATIO No results found for: IGGSERUM, IGA, IGMSERUM No results found for: Odetta Pink, SPEI   Chemistry      Component Value Date/Time   NA 139 02/12/2016 1155   NA 143 10/18/2015 1054   K 4.3 02/12/2016 1155   K 4.4 10/18/2015 1054   CL 100 02/12/2016 1155   CO2 32 02/12/2016 1155   CO2 27 10/18/2015 1054   BUN 16 02/12/2016 1155   BUN 16.1 10/18/2015 1054   CREATININE 0.63 02/12/2016 1155   CREATININE 0.7 10/18/2015 1054      Component Value Date/Time   CALCIUM 9.3 02/12/2016 1155   CALCIUM 9.8 10/18/2015 1054   ALKPHOS 147 (H) 02/12/2016 1155   ALKPHOS 146 10/18/2015 1054   AST 22 02/12/2016 1155   AST 20 10/18/2015 1054   ALT 18 02/12/2016 1155   ALT 17 10/18/2015 1054   BILITOT 0.4 02/12/2016 1155   BILITOT 0.36 10/18/2015 1054     Impression and Plan: [name] is a very pleasant 49 yo African American female with a history of thrombocytosis. She continues to do well and so far, this has not been an issue for her. Her count remain stable. Platelets are 453, WBC count 7.1 and Hgb 12.7.  She is asymptomatic at this time.  We will continue to follow along with her and plan to see her back in 6 months for labs and follow-up.  She will contact our office with any questions or concerns and we will schedule an appointment at that time.    Eliezer Bottom, NP 11/27/20178:35 AM

## 2016-04-23 ENCOUNTER — Other Ambulatory Visit: Payer: Self-pay

## 2016-04-23 DIAGNOSIS — R1010 Upper abdominal pain, unspecified: Secondary | ICD-10-CM

## 2016-04-23 MED ORDER — OMEPRAZOLE 40 MG PO CPDR: 40.0000 mg | DELAYED_RELEASE_CAPSULE | Freq: Every day | ORAL | 1 refills | Status: DC

## 2016-06-05 ENCOUNTER — Encounter: Payer: Self-pay | Admitting: Pulmonary Disease

## 2016-06-05 ENCOUNTER — Ambulatory Visit (INDEPENDENT_AMBULATORY_CARE_PROVIDER_SITE_OTHER): Payer: BLUE CROSS/BLUE SHIELD | Admitting: Pulmonary Disease

## 2016-06-05 DIAGNOSIS — G4710 Hypersomnia, unspecified: Secondary | ICD-10-CM | POA: Diagnosis not present

## 2016-06-05 NOTE — Patient Instructions (Signed)
  It was a pleasure taking care of you today!  Let us know if her having issues with sleep.  Return to the office as needed.

## 2016-06-05 NOTE — Progress Notes (Signed)
Subjective:    Patient ID: [name], female    DOB: [birth year], 51 y.o.   MRN: [id number]  HPI   This is the case of [name], 51 y.o. Female, who was referred by Dr. Roma Schanz  in consultation regarding OSA.    As you very well know, patient is a non smoker, no known lung dse.  Pt had OSA in 2008. Severe. Used cpap. Tolerated cpap.  Pt had gastric bypass on 2012. Lost 85 lbs.  She clinically improved so she stopped cpap.  Has gained 20 lbs, and starting to be symptomatic. Has hypersomnia, frequent awakenings.  Sleeps 4 hrs/night. Difficult to stay asleep.  Has snoring, gasping, choking.  (-) abn behavior in sleep.  Has tossing and turning.    ROV 06/05/16 Patient returns to the office for hypersomnia. Since last seen, she had an in lab sleep study in July 2017 which was negative for sleep apnea. Her sleep is better. Melatonin helps her.  She was diagnosed with MCTD recently.  Avoiding prednisone.  She sleeps 6 hrs/night. Has snoring. (-) witnessed apneas. Wakes up refreshed in am.  Occasional nap in pm.      Review of Systems  Constitutional: Negative.  Negative for chills, fever and unexpected weight change.  HENT: Negative.  Negative for congestion, dental problem, ear pain, nosebleeds, postnasal drip, rhinorrhea, sinus pressure, sneezing, sore throat, trouble swallowing and voice change.   Eyes: Negative.  Negative for visual disturbance.  Respiratory: Positive for shortness of breath. Negative for cough and choking.   Cardiovascular: Negative.  Negative for chest pain and leg swelling.  Gastrointestinal: Negative.  Negative for abdominal pain, diarrhea and vomiting.  Endocrine: Negative.   Genitourinary: Negative.  Negative for difficulty urinating.  Musculoskeletal: Negative.  Negative for arthralgias.  Skin: Negative for rash.  Allergic/Immunologic: Negative.   Neurological: Negative.  Negative for tremors, syncope and headaches.  Hematological:  Negative.  Does not bruise/bleed easily.  Psychiatric/Behavioral: Negative.   All other systems reviewed and are negative.    Objective:   Physical Exam  Vitals:  Vitals:   06/05/16 0931  BP: 116/82  Pulse: 60  SpO2: 100%  Weight: 240 lb 12.8 oz (109.2 kg)  Height: 5\' 4"  (1.626 m)    Constitutional/General:  Pleasant, well-nourished, well-developed, not in any distress,  Comfortably seating.  Well kempt  Body mass index is 41.33 kg/m. Wt Readings from Last 3 Encounters:  06/05/16 240 lb 12.8 oz (109.2 kg)  04/21/16 239 lb (108.4 kg)  02/12/16 234 lb 6.4 oz (106.3 kg)     HEENT: Pupils equal and reactive to light and accommodation. Anicteric sclerae. Normal nasal mucosa.   No oral  lesions,  mouth clear,  oropharynx clear, no postnasal drip. (-) Oral thrush. No dental caries.  Airway - Mallampati class III  Neck: No masses. Midline trachea. No JVD, (-) LAD. (-) bruits appreciated.  Respiratory/Chest: Grossly normal chest. (-) deformity. (-) Accessory muscle use.  Symmetric expansion. (-) Tenderness on palpation.  Resonant on percussion.  Diminished BS on both lower lung zones. (-) wheezing, crackles, rhonchi (-) egophony  Cardiovascular: Regular rate and  rhythm, heart sounds normal, no murmur or gallops, no peripheral edema  Gastrointestinal:  Normal bowel sounds. Soft, non-tender. No hepatosplenomegaly.  (-) masses.   Musculoskeletal:  Normal muscle tone. Normal gait.   Extremities: Grossly normal. (-) clubbing, cyanosis.  (-) edema  Skin: (-) rash,lesions seen.   Neurological/Psychiatric : alert, oriented to time, place, person. Normal mood  and affect            Assessment & Plan:  Hypersomnia Pt had OSA in 2008. Severe. Used cpap. Tolerated cpap.  Pt had gastric bypass on 2012. Lost 85 lbs.  She clinically improved so she stopped cpap.  She slowly gained weight the last 1-2 years. Starting to be symptomatic with snoring. We did a sleep study  in July 2017 and it was negative for sleep apnea. Her hypersomnia is improved. She is snoring. No witnessed apneas. She is sleeping longer now on 6 hours per day. Wakes up refreshed in the morning. No abnormal behavior and sleep. Takes melatonin when necessary at bedtime.  We extensively discussed sleep hygiene. Told her to give [place] a call so we can repeat his sleep study if she gets more symptomatic. Age and weight will be risk factors for sleep apnea. She probably has sleep apnea but it is very mild and she is not too symptomatic at this point.    MORBID OBESITY Weight reduction      Patient will follow up with me as needed.     Monica Becton, MD 06/05/2016   9:56 AM [facility] and [place] Pager: [phone] Office: [phone], Fax: [phone]

## 2016-06-05 NOTE — Assessment & Plan Note (Addendum)
Pt had OSA in 2008. Severe. Used cpap. Tolerated cpap.  Pt had gastric bypass on 2012. Lost 85 lbs.  She clinically improved so she stopped cpap.  She slowly gained weight the last 1-2 years. Starting to be symptomatic with snoring. We did a sleep study in July 2017 and it was negative for sleep apnea. Her hypersomnia is improved. She is snoring. No witnessed apneas. She is sleeping longer now on 6 hours per day. Wakes up refreshed in the morning. No abnormal behavior and sleep. Takes melatonin when necessary at bedtime.  We extensively discussed sleep hygiene. Told her to give [place] a call so we can repeat his sleep study if she gets more symptomatic. Age and weight will be risk factors for sleep apnea. She probably has sleep apnea but it is very mild and she is not too symptomatic at this point.

## 2016-06-05 NOTE — Assessment & Plan Note (Signed)
Weight reduction 

## 2016-06-14 ENCOUNTER — Other Ambulatory Visit: Payer: Self-pay | Admitting: Family Medicine

## 2016-06-16 NOTE — Telephone Encounter (Signed)
Last vitamin D lab done on 02/12/2016---was 43.85

## 2016-07-02 ENCOUNTER — Other Ambulatory Visit: Payer: Self-pay | Admitting: Family Medicine

## 2016-07-02 DIAGNOSIS — E039 Hypothyroidism, unspecified: Secondary | ICD-10-CM

## 2016-07-14 ENCOUNTER — Other Ambulatory Visit: Payer: Self-pay | Admitting: Family Medicine

## 2016-07-14 NOTE — Telephone Encounter (Signed)
Left message on voicemail @ [phone] for patient to call the office to schedule follow up with provider

## 2016-07-14 NOTE — Telephone Encounter (Signed)
Patient needs follow up appt.  thx pc

## 2016-07-15 NOTE — Telephone Encounter (Signed)
Patient scheduled for follow up.

## 2016-07-30 ENCOUNTER — Other Ambulatory Visit: Payer: Self-pay | Admitting: Family Medicine

## 2016-07-30 DIAGNOSIS — Z9109 Other allergy status, other than to drugs and biological substances: Secondary | ICD-10-CM

## 2016-08-18 ENCOUNTER — Ambulatory Visit: Payer: BLUE CROSS/BLUE SHIELD | Admitting: Family Medicine

## 2016-08-19 ENCOUNTER — Ambulatory Visit (INDEPENDENT_AMBULATORY_CARE_PROVIDER_SITE_OTHER): Payer: BLUE CROSS/BLUE SHIELD | Admitting: Family Medicine

## 2016-08-19 ENCOUNTER — Encounter: Payer: Self-pay | Admitting: Family Medicine

## 2016-08-19 VITALS — BP 112/78 | HR 55 | Temp 97.8°F | Resp 16 | Ht 64.0 in | Wt 239.6 lb

## 2016-08-19 DIAGNOSIS — I10 Essential (primary) hypertension: Secondary | ICD-10-CM | POA: Diagnosis not present

## 2016-08-19 DIAGNOSIS — E039 Hypothyroidism, unspecified: Secondary | ICD-10-CM | POA: Diagnosis not present

## 2016-08-19 DIAGNOSIS — R809 Proteinuria, unspecified: Secondary | ICD-10-CM

## 2016-08-19 LAB — COMPREHENSIVE METABOLIC PANEL
ALT: 16 U/L (ref 0–35)
AST: 20 U/L (ref 0–37)
Albumin: 4.4 g/dL (ref 3.5–5.2)
Alkaline Phosphatase: 147 U/L — ABNORMAL HIGH (ref 39–117)
BUN: 14 mg/dL (ref 6–23)
CO2: 31 meq/L (ref 19–32)
Calcium: 9.2 mg/dL (ref 8.4–10.5)
Chloride: 100 mEq/L (ref 96–112)
Creatinine, Ser: 0.64 mg/dL (ref 0.40–1.20)
GFR: 125.67 mL/min (ref >60.00)
GLUCOSE: 93 mg/dL (ref 70–99)
POTASSIUM: 3.4 meq/L — AB (ref 3.5–5.1)
Sodium: 137 mEq/L (ref 135–145)
TOTAL PROTEIN: 7.5 g/dL (ref 6.0–8.3)
Total Bilirubin: 0.4 mg/dL (ref 0.2–1.2)

## 2016-08-19 LAB — POC URINALSYSI DIPSTICK (AUTOMATED)
BILIRUBIN UA: NEGATIVE
Blood, UA: NEGATIVE
GLUCOSE UA: NEGATIVE
Ketones, UA: NEGATIVE
LEUKOCYTES UA: NEGATIVE
NITRITE UA: NEGATIVE
PH UA: 6 (ref 5.0–8.0)
Protein, UA: NEGATIVE
Spec Grav, UA: 1.015 (ref 1.030–1.035)
Urobilinogen, UA: NEGATIVE (ref ?–2.0)

## 2016-08-19 LAB — CBC
HEMATOCRIT: 39.3 % (ref 36.0–46.0)
HEMOGLOBIN: 13 g/dL (ref 12.0–15.0)
MCHC: 33.1 g/dL (ref 30.0–36.0)
MCV: 87.1 fl (ref 78.0–100.0)
Platelets: 488 10*3/uL — ABNORMAL HIGH (ref 150.0–400.0)
RBC: 4.52 Mil/uL (ref 3.87–5.11)
RDW: 14 % (ref 11.5–15.5)
WBC: 8 10*3/uL (ref 4.0–10.5)

## 2016-08-19 LAB — LIPID PANEL
Cholesterol: 186 mg/dL (ref 0–200)
HDL: 54.3 mg/dL (ref >39.00)
LDL CALC: 119 mg/dL — AB (ref 0–99)
NONHDL: 131.39
Total CHOL/HDL Ratio: 3
Triglycerides: 64 mg/dL (ref 0.0–149.0)
VLDL: 12.8 mg/dL (ref 0.0–40.0)

## 2016-08-19 LAB — MICROALBUMIN / CREATININE URINE RATIO
Creatinine,U: 64 mg/dL
MICROALB/CREAT RATIO: 1.1 mg/g (ref 0.0–30.0)
Microalb, Ur: <0.7 mg/dL (ref 0.0–1.9)

## 2016-08-19 LAB — TSH: TSH: 1.25 u[IU]/mL (ref 0.35–4.50)

## 2016-08-19 NOTE — Patient Instructions (Signed)

## 2016-08-19 NOTE — Progress Notes (Signed)
Pre visit review using our clinic review tool, if applicable. No additional management support is needed unless otherwise documented below in the visit note. 

## 2016-08-19 NOTE — Assessment & Plan Note (Signed)
Well controlled, no changes to meds. Encouraged heart healthy diet such as the DASH diet and exercise as tolerated.  °

## 2016-08-19 NOTE — Progress Notes (Signed)
Subjective:  I acted as a Education administrator for Dr. Royden Purl, LPN    Patient ID: [name], female    DOB: 07-11-1965, 51 y.o.   MRN: [id number]  Chief Complaint  Patient presents with  . Hypertension    medication follow up    Hypertension  This is a chronic problem. The current episode started more than 1 year ago. The problem is controlled. Pertinent negatives include no blurred vision, chest pain, headaches or palpitations.    Patient is in today for follow up hypertension, hypothyroidism. Patient report she was seen by a rheumatologist and found out she has protein in her urine, rheumatologist referred patient back to PCP. Patient report her back hurts at times, she drink water and cranberry juice and it goes away.   Patient Care Team: Ann Held, DO as PCP - General   Past Medical History:  Diagnosis Date  . Allergic rhinitis, cause unspecified   . Anemia, iron deficiency   . COPD (chronic obstructive pulmonary disease) ([place])   . Diastolic dysfunction   . Diverticulosis of colon   . GERD (gastroesophageal reflux disease)   . Goiter   . Hiatal hernia   . Hypertension   . IBS (irritable bowel syndrome)   . Morbid obesity ([place])   . Obstructive sleep apnea   . Thrombocytosis ([place])   . Undiagnosed cardiac murmurs     Past Surgical History:  Procedure Laterality Date  . BARIATRIC SURGERY  06/17/2010  . BILATERAL SALPINGECTOMY Bilateral 06/22/2014   Procedure: BILATERAL SALPINGECTOMY;  Surgeon: Crawford Givens, MD;  Location: [facility];  Service: Gynecology;  Laterality: Bilateral;  . CYSTOSCOPY N/A 06/22/2014   Procedure: CYSTOSCOPY;  Surgeon: Crawford Givens, MD;  Location: [facility];  Service: Gynecology;  Laterality: N/A;  . ENDOMETRIAL ABLATION  12/08  . LAPAROSCOPIC ASSISTED VAGINAL HYSTERECTOMY N/A 06/22/2014   Procedure: LAPAROSCOPIC ASSISTED VAGINAL HYSTERECTOMY;  Surgeon: Crawford Givens, MD;  Location: [facility];  Service: Gynecology;  Laterality: N/A;  . LIPOMA  EXCISION     upper back  . SPINE SURGERY  02/10/2012   L4-l5 fusion--[facility]    Family History  Problem Relation Age of Onset  . Breast cancer Mother   . Arrhythmia Mother   . Heart disease Mother   . Cancer Mother 58    breast  . Diabetes Mellitus II Mother   . Hypertension Father   . Kidney cancer Father   . Colon polyps Father   . Lymphoma Father     Diffused Large B-cell Lymphoma  . Cancer Father     lymphoma  . Cancer Maternal Aunt 100    breast cancer  . Colon cancer Neg Hx     Social History   Social History  . Marital status: Married    Spouse name: N/A  . Number of children: 3  . Years of education: N/A   Occupational History  . 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 Smoking status: Never Smoker  . Smokeless tobacco: Never Used     Comment: Never Used Tobacco  . Alcohol use No  . Drug use: No  . Sexual activity: Yes    Partners: Male   Other Topics Concern  . Not on file   Social History Narrative  . No narrative on file    Outpatient Medications Prior to Visit  Medication Sig Dispense Refill  . Cyanocobalamin (VITAMIN B-12 PO) Take 1 tablet by mouth daily.    . Glucosamine Sulfate  1000 MG CAPS Take by mouth.    . hydrochlorothiazide (HYDRODIURIL) 25 MG tablet Take 1 tablet (25 mg total) by mouth daily. 90 tablet 1  . ibuprofen (ADVIL,MOTRIN) 600 MG tablet 1  po  pc  every 6 hours x 5 days then as needed for pain 30 tablet 1  . ipratropium (ATROVENT) 0.03 % nasal spray     . levothyroxine (SYNTHROID, LEVOTHROID) 88 MCG tablet Take 1 tablet (88 mcg total) by mouth daily. 30 tablet 6  . levothyroxine (SYNTHROID, LEVOTHROID) 88 MCG tablet TAKE 1 TABLET BY MOUTH EVERY DAY 30 tablet 3  . loratadine (CLARITIN) 10 MG tablet TAKE 1 TABLET BY MOUTH EVERY DAY 90 tablet 2  . losartan (COZAAR) 100 MG tablet Take 1 tablet (100 mg total) by mouth daily. 90 tablet 3  . magnesium 30 MG tablet Take 30 mg by mouth 2 (two) times daily.    Marland Kitchen omeprazole  (PRILOSEC) 40 MG capsule Take 1 capsule (40 mg total) by mouth daily. 90 capsule 1  . potassium chloride SA (KLOR-CON M20) 20 MEQ tablet Take 1 tablet (20 mEq total) by mouth daily. 90 tablet 1  . Vitamin D, Ergocalciferol, (DRISDOL) 50000 units CAPS capsule TAKE 1 CAPSULE BY MOUTH EVERY 7 DAYS 12 capsule 1  . hydrochlorothiazide (HYDRODIURIL) 25 MG tablet TAKE 1 TABLET (25 MG TOTAL) BY MOUTH DAILY. 90 tablet 1   No facility-administered medications prior to visit.     Allergies  Allergen Reactions  . Penicillins Other (See Comments)    Causes severe, difficult to treat yeast infections    Review of Systems  Constitutional: Negative for fever.  HENT: Negative for congestion.   Eyes: Negative for blurred vision.  Respiratory: Negative for cough.   Cardiovascular: Negative for chest pain and palpitations.  Gastrointestinal: Negative for vomiting.  Musculoskeletal: Negative for back pain.  Skin: Negative for rash.  Neurological: Negative for loss of consciousness and headaches.       Objective:    Physical Exam  Constitutional: She is oriented to person, place, and time. She appears well-developed and well-nourished. No distress.  HENT:  Head: Normocephalic and atraumatic.  Eyes: Conjunctivae are normal. Pupils are equal, round, and reactive to light.  Neck: Normal range of motion. No thyromegaly present.  Cardiovascular: Normal rate and regular rhythm.   Murmur heard. 2 out 6 murmur  Pulmonary/Chest: Effort normal and breath sounds normal. She has no wheezes.  Abdominal: Soft. Bowel sounds are normal. There is no tenderness.  Musculoskeletal: Normal range of motion. She exhibits no edema or deformity.  Neurological: She is alert and oriented to person, place, and time.  Skin: Skin is warm and dry. She is not diaphoretic.  Psychiatric: She has a normal mood and affect. Her behavior is normal. Judgment and thought content normal.  Nursing note and vitals reviewed.   BP  112/78 (BP Location: Left Arm, Patient Position: Sitting, Cuff Size: Large)   Pulse (!) 55   Temp 97.8 F (36.6 C) (Oral)   Resp 16   Ht '5\' 4"'$  (1.626 m)   Wt 239 lb 9.6 oz (108.7 kg)   LMP 01/20/2011   SpO2 98%   BMI 41.13 kg/m  Wt Readings from Last 3 Encounters:  08/19/16 239 lb 9.6 oz (108.7 kg)  06/05/16 240 lb 12.8 oz (109.2 kg)  04/21/16 239 lb (108.4 kg)   BP Readings from Last 3 Encounters:  08/19/16 112/78  06/05/16 116/82  04/21/16 135/72     Immunization History  Administered Date(s) Administered  . Td 02/07/2009    Health Maintenance  Topic Date Due  . INFLUENZA VACCINE  12/25/2015  . MAMMOGRAM  11/01/2016  . COLONOSCOPY  02/21/2017  . TETANUS/TDAP  02/08/2019  . HIV Screening  Completed    Lab Results  Component Value Date   WBC 7.1 04/21/2016   HGB 12.7 04/21/2016   HCT 37.9 04/21/2016   PLT 453 (H) 04/21/2016   GLUCOSE 91 04/21/2016   CHOL 189 02/12/2016   TRIG 70.0 02/12/2016   HDL 59.00 02/12/2016   LDLCALC 116 (H) 02/12/2016   ALT 15 04/21/2016   AST 20 04/21/2016   NA 139 04/21/2016   K 3.3 (L) 04/21/2016   CL 100 02/12/2016   CREATININE 0.7 04/21/2016   BUN 14.5 04/21/2016   CO2 27 04/21/2016   TSH 2.00 02/12/2016   HGBA1C 5.8 07/28/2014    Lab Results  Component Value Date   TSH 2.00 02/12/2016   Lab Results  Component Value Date   WBC 7.1 04/21/2016   HGB 12.7 04/21/2016   HCT 37.9 04/21/2016   MCV 88 04/21/2016   PLT 453 (H) 04/21/2016   Lab Results  Component Value Date   NA 139 04/21/2016   K 3.3 (L) 04/21/2016   CHLORIDE 102 04/21/2016   CO2 27 04/21/2016   GLUCOSE 91 04/21/2016   BUN 14.5 04/21/2016   CREATININE 0.7 04/21/2016   BILITOT 0.36 04/21/2016   ALKPHOS 164 (H) 04/21/2016   AST 20 04/21/2016   ALT 15 04/21/2016   PROT 7.4 04/21/2016   ALBUMIN 3.6 04/21/2016   CALCIUM 8.9 04/21/2016   ANIONGAP 10 04/21/2016   EGFR >90 04/21/2016   GFR 128.24 02/12/2016   Lab Results  Component Value Date    CHOL 189 02/12/2016   Lab Results  Component Value Date   HDL 59.00 02/12/2016   Lab Results  Component Value Date   LDLCALC 116 (H) 02/12/2016   Lab Results  Component Value Date   TRIG 70.0 02/12/2016   Lab Results  Component Value Date   CHOLHDL 3 02/12/2016   Lab Results  Component Value Date   HGBA1C 5.8 07/28/2014         Assessment & Plan:   Problem List Items Addressed This Visit      Unprioritized   Hypothyroidism   Relevant Orders   TSH   Essential hypertension - Primary    Well controlled, no changes to meds. Encouraged heart healthy diet such as the DASH diet and exercise as tolerated.       Relevant Orders   CBC   Comprehensive metabolic panel   Lipid panel   Microalbumin / creatinine urine ratio   POCT Urinalysis Dipstick (Automated)    Other Visit Diagnoses    Proteinuria, unspecified type       Relevant Orders   POCT Urinalysis Dipstick (Automated)      I am having Ms. Christon maintain her Cyanocobalamin (VITAMIN B-12 PO), ibuprofen, ipratropium, Glucosamine Sulfate, magnesium, potassium chloride SA, losartan, levothyroxine, hydrochlorothiazide, omeprazole, Vitamin D (Ergocalciferol), levothyroxine, loratadine, and cyclobenzaprine.  Meds ordered this encounter  Medications  . cyclobenzaprine (FLEXERIL) 10 MG tablet    Sig: Take 10 mg by mouth 3 (three) times daily as needed.    Refill:  1    CMA served as Education administrator during this visit. History, Physical and Plan performed by medical provider. Documentation and orders reviewed and attested to.  Ann Held, DO   Patient ID:  [name], female   DOB: [birth year], 51 y.o.   MRN: [id number]

## 2016-08-20 ENCOUNTER — Other Ambulatory Visit: Payer: Self-pay | Admitting: Family Medicine

## 2016-08-20 DIAGNOSIS — I10 Essential (primary) hypertension: Secondary | ICD-10-CM

## 2016-08-20 MED ORDER — POTASSIUM CHLORIDE CRYS ER 20 MEQ PO TBCR: 20.0000 meq | EXTENDED_RELEASE_TABLET | Freq: Two times a day (BID) | ORAL | 5 refills | Status: DC

## 2016-09-08 ENCOUNTER — Other Ambulatory Visit: Payer: Self-pay | Admitting: Family Medicine

## 2016-09-08 DIAGNOSIS — I10 Essential (primary) hypertension: Secondary | ICD-10-CM

## 2016-09-26 ENCOUNTER — Other Ambulatory Visit: Payer: Self-pay | Admitting: Family Medicine

## 2016-09-26 MED ORDER — LEVOTHYROXINE SODIUM 88 MCG PO TABS: 88.0000 ug | ORAL_TABLET | Freq: Every day | ORAL | 6 refills | Status: DC

## 2016-10-08 ENCOUNTER — Encounter: Payer: Self-pay | Admitting: Family

## 2016-10-21 ENCOUNTER — Ambulatory Visit (HOSPITAL_BASED_OUTPATIENT_CLINIC_OR_DEPARTMENT_OTHER): Payer: BLUE CROSS/BLUE SHIELD | Admitting: Family

## 2016-10-21 ENCOUNTER — Other Ambulatory Visit: Payer: Self-pay | Admitting: Family Medicine

## 2016-10-21 ENCOUNTER — Other Ambulatory Visit (HOSPITAL_BASED_OUTPATIENT_CLINIC_OR_DEPARTMENT_OTHER): Payer: BLUE CROSS/BLUE SHIELD

## 2016-10-21 VITALS — BP 138/55 | HR 52 | Temp 97.9°F | Resp 16 | Wt 240.4 lb

## 2016-10-21 DIAGNOSIS — D75839 Thrombocytosis, unspecified: Secondary | ICD-10-CM

## 2016-10-21 DIAGNOSIS — D473 Essential (hemorrhagic) thrombocythemia: Secondary | ICD-10-CM

## 2016-10-21 DIAGNOSIS — D508 Other iron deficiency anemias: Secondary | ICD-10-CM

## 2016-10-21 DIAGNOSIS — E039 Hypothyroidism, unspecified: Secondary | ICD-10-CM

## 2016-10-21 DIAGNOSIS — K909 Intestinal malabsorption, unspecified: Secondary | ICD-10-CM

## 2016-10-21 LAB — CBC WITH DIFFERENTIAL (CANCER CENTER ONLY)
BASO#: 0 10*3/uL (ref 0.0–0.2)
BASO%: 0.4 % (ref 0.0–2.0)
EOS%: 1.2 % (ref 0.0–7.0)
Eosinophils Absolute: 0.1 10*3/uL (ref 0.0–0.5)
HCT: 39.8 % (ref 34.8–46.6)
HEMOGLOBIN: 13.2 g/dL (ref 11.6–15.9)
LYMPH#: 3.3 10*3/uL (ref 0.9–3.3)
LYMPH%: 39 % (ref 14.0–48.0)
MCH: 29.5 pg (ref 26.0–34.0)
MCHC: 33.2 g/dL (ref 32.0–36.0)
MCV: 89 fL (ref 81–101)
MONO#: 0.5 10*3/uL (ref 0.1–0.9)
MONO%: 6.5 % (ref 0.0–13.0)
NEUT#: 4.4 10*3/uL (ref 1.5–6.5)
NEUT%: 52.9 % (ref 39.6–80.0)
Platelets: 488 10*3/uL — ABNORMAL HIGH (ref 145–400)
RBC: 4.48 10*6/uL (ref 3.70–5.32)
RDW: 14.1 % (ref 11.1–15.7)
WBC: 8.4 10*3/uL (ref 3.9–10.0)

## 2016-10-21 LAB — COMPREHENSIVE METABOLIC PANEL
ALBUMIN: 4 g/dL (ref 3.5–5.0)
ALK PHOS: 155 U/L — AB (ref 40–150)
ALT: 21 U/L (ref 0–55)
AST: 24 U/L (ref 5–34)
Anion Gap: 8 mEq/L (ref 3–11)
BILIRUBIN TOTAL: 0.42 mg/dL (ref 0.20–1.20)
BUN: 13.5 mg/dL (ref 7.0–26.0)
CO2: 29 mEq/L (ref 22–29)
Calcium: 9.4 mg/dL (ref 8.4–10.4)
Chloride: 102 mEq/L (ref 98–109)
Creatinine: 0.7 mg/dL (ref 0.6–1.1)
GLUCOSE: 88 mg/dL (ref 70–140)
Potassium: 3.8 mEq/L (ref 3.5–5.1)
SODIUM: 140 meq/L (ref 136–145)
TOTAL PROTEIN: 7.4 g/dL (ref 6.4–8.3)

## 2016-10-21 LAB — LACTATE DEHYDROGENASE: LDH: 218 U/L (ref 125–245)

## 2016-10-21 LAB — IRON AND TIBC
%SAT: 18 % — ABNORMAL LOW (ref 21–57)
IRON: 62 ug/dL (ref 41–142)
TIBC: 336 ug/dL (ref 236–444)
UIBC: 274 ug/dL (ref 120–384)

## 2016-10-21 LAB — FERRITIN: Ferritin: 19 ng/ml (ref 9–269)

## 2016-10-21 NOTE — Progress Notes (Signed)
Hematology and Oncology Follow Up Visit  [name] [id number] 1965/06/10 51 y.o. 10/21/2016   Principle Diagnosis:  1. Thrombocytosis 2. Family history of lymphoma (father) 3. Iron deficiency anemia secondary to malabsorption with bariatric surgery   Current Therapy:   Observation   Interim History:  [name] is here today for follow-up. She is having some fatigue. She has a history of iron deficiency due to malabsorption wince having [name] bariatric surgery in 2012. We rechecked [name] iron studies today.  She states that [name] rheumatologist recently diagnosed [name] with connective tissue disease. She still has an occasional low grade fever at night. This comes and goes.  WBC count and Hgb are stable. Platelet count is mildly elevated at 488.  No chills, n/v, cough, rash, dizziness, SOB, chest pain, palpitations, abdominal pain or changes in bowel or bladder habits.  No lymphadenopathy found on exam. No episodes of bleeding, bruising or petechiae.  No swelling or tenderness in [name] extremities at this time. She states that she has a bone spur in [name] neck which causes [name] to have numbness and tingling in [name] arms at times.  She is staying busy with [name] grand babies and has another grandson due any time now.  She states that she has been so busy she has not been able to walk for exercise as often.   ECOG Performance Status: 0 - Asymptomatic  Medications:  Allergies as of 10/21/2016      Reactions   Penicillins Other (See Comments)   Causes severe, difficult to treat yeast infections      Medication List       Accurate as of 10/21/16  9:31 AM. Always use your most recent med list.          cyclobenzaprine 10 MG tablet Commonly known as:  FLEXERIL Take 10 mg by mouth 3 (three) times daily as needed.   Glucosamine Sulfate 1000 MG Caps Take by mouth.   hydrochlorothiazide 25 MG tablet Commonly known as:  HYDRODIURIL Take 1 tablet (25 mg total) by mouth daily.   ibuprofen 600  MG tablet Commonly known as:  ADVIL,MOTRIN 1  po  pc  every 6 hours x 5 days then as needed for pain   ipratropium 0.03 % nasal spray Commonly known as:  ATROVENT   levothyroxine 88 MCG tablet Commonly known as:  SYNTHROID, LEVOTHROID TAKE 1 TABLET BY MOUTH EVERY DAY   levothyroxine 88 MCG tablet Commonly known as:  SYNTHROID, LEVOTHROID Take 1 tablet (88 mcg total) by mouth daily.   loratadine 10 MG tablet Commonly known as:  CLARITIN TAKE 1 TABLET BY MOUTH EVERY DAY   losartan 100 MG tablet Commonly known as:  COZAAR TAKE 1 TABLET BY MOUTH EVERY DAY   magnesium 30 MG tablet Take 30 mg by mouth 2 (two) times daily.   omeprazole 40 MG capsule Commonly known as:  PRILOSEC Take 1 capsule (40 mg total) by mouth daily.   potassium chloride SA 20 MEQ tablet Commonly known as:  KLOR-CON M20 Take 1 tablet (20 mEq total) by mouth 2 (two) times daily.   VITAMIN B-12 PO Take 1 tablet by mouth daily.   Vitamin D (Ergocalciferol) 50000 units Caps capsule Commonly known as:  DRISDOL TAKE 1 CAPSULE BY MOUTH EVERY 7 DAYS       Allergies:  Allergies  Allergen Reactions  . Penicillins Other (See Comments)    Causes severe, difficult to treat yeast infections    Past Medical History, Surgical history, Social  history, and Family History were reviewed and updated.  Review of Systems: All other 10 point review of systems is negative.   Physical Exam:  vitals were not taken for this visit.  Wt Readings from Last 3 Encounters:  08/19/16 239 lb 9.6 oz (108.7 kg)  06/05/16 240 lb 12.8 oz (109.2 kg)  04/21/16 239 lb (108.4 kg)    Ocular: Sclerae unicteric, pupils equal, round and reactive to light Ear-nose-throat: Oropharynx clear, dentition fair Lymphatic: No cervical, supraclavicular or axillary adenopathy Lungs no rales or rhonchi, good excursion bilaterally Heart regular rate and rhythm, no murmur appreciated Abd soft, nontender, positive bowel sounds, no liver or  spleen tip palpated on exam, no fluid wave MSK no focal spinal tenderness, no joint edema Neuro: non-focal, well-oriented, appropriate affect Breasts: Deferred  Lab Results  Component Value Date   WBC 8.0 08/19/2016   HGB 13.0 08/19/2016   HCT 39.3 08/19/2016   MCV 87.1 08/19/2016   PLT 488.0 (H) 08/19/2016   Lab Results  Component Value Date   FERRITIN 43 04/30/2015   IRON 93 04/30/2015   TIBC 296 04/30/2015   UIBC 204 04/30/2015   IRONPCTSAT 31 04/30/2015   Lab Results  Component Value Date   RETICCTPCT 1.8 04/19/2014   RBC 4.52 08/19/2016   RETICCTABS 79.2 04/19/2014   No results found for: KPAFRELGTCHN, LAMBDASER, KAPLAMBRATIO No results found for: IGGSERUM, IGA, IGMSERUM No results found for: Odetta Pink, SPEI   Chemistry      Component Value Date/Time   NA 137 08/19/2016 1141   NA 139 04/21/2016 0827   K 3.4 (L) 08/19/2016 1141   K 3.3 (L) 04/21/2016 0827   CL 100 08/19/2016 1141   CO2 31 08/19/2016 1141   CO2 27 04/21/2016 0827   BUN 14 08/19/2016 1141   BUN 14.5 04/21/2016 0827   CREATININE 0.64 08/19/2016 1141   CREATININE 0.7 04/21/2016 0827      Component Value Date/Time   CALCIUM 9.2 08/19/2016 1141   CALCIUM 8.9 04/21/2016 0827   ALKPHOS 147 (H) 08/19/2016 1141   ALKPHOS 164 (H) 04/21/2016 0827   AST 20 08/19/2016 1141   AST 20 04/21/2016 0827   ALT 16 08/19/2016 1141   ALT 15 04/21/2016 0827   BILITOT 0.4 08/19/2016 1141   BILITOT 0.36 04/21/2016 0827      Impression and Plan: [name] is a very pleasant 51 yo African American female with history of thrombocytosis and iron deficiency anemia secondary to malabsorption after bariatric surgery. Platelet count is 488 and she has had intermittent fatigue.  We will see what [name] iron studies show and bring [name] back in later this week for an infusion if needed.  WBC count and Hgb are stable. No lymphadenopathy found on exam.  We will go ahead  and plan to see [name] back in 6 months for repeat lab work and follow-up.  She will contact our office with any questions or concerns.   Eliezer Bottom, NP 5/29/20189:31 AM

## 2016-10-22 ENCOUNTER — Telehealth: Payer: Self-pay | Admitting: *Deleted

## 2016-10-22 NOTE — Telephone Encounter (Addendum)
Patient is aware of results. Appointment made  ----- Message from Eliezer Bottom, NP sent at 10/21/2016  2:08 PM EDT ----- Regarding: Iron  Needs one dose of IV iron please. LOS sent to [facility]. Thank you!  Sarah  ----- Message ----- From: Interface, Lab In Three Zero One Sent: 10/21/2016   9:34 AM To: Eliezer Bottom, NP

## 2016-10-27 ENCOUNTER — Ambulatory Visit (HOSPITAL_BASED_OUTPATIENT_CLINIC_OR_DEPARTMENT_OTHER): Payer: BLUE CROSS/BLUE SHIELD

## 2016-10-27 ENCOUNTER — Other Ambulatory Visit: Payer: Self-pay | Admitting: Family

## 2016-10-27 VITALS — BP 127/64 | HR 56 | Temp 97.7°F | Resp 17

## 2016-10-27 DIAGNOSIS — D508 Other iron deficiency anemias: Secondary | ICD-10-CM | POA: Diagnosis not present

## 2016-10-27 DIAGNOSIS — K909 Intestinal malabsorption, unspecified: Secondary | ICD-10-CM

## 2016-10-27 MED ORDER — SODIUM CHLORIDE 0.9 % IV SOLN
510.0000 mg | Freq: Once | INTRAVENOUS | Status: AC
  Administered 2016-10-27: 510 mg via INTRAVENOUS
  Filled 2016-10-27: qty 17

## 2016-10-27 MED ORDER — SODIUM CHLORIDE 0.9 % IV SOLN
Freq: Once | INTRAVENOUS | Status: AC
  Administered 2016-10-27: 10:00:00 via INTRAVENOUS

## 2016-10-27 NOTE — Patient Instructions (Signed)

## 2016-11-19 ENCOUNTER — Other Ambulatory Visit: Payer: Self-pay | Admitting: Family Medicine

## 2016-11-19 DIAGNOSIS — Z1231 Encounter for screening mammogram for malignant neoplasm of breast: Secondary | ICD-10-CM

## 2016-12-08 ENCOUNTER — Ambulatory Visit
Admission: RE | Admit: 2016-12-08 | Discharge: 2016-12-08 | Disposition: A | Discharge status: Home / Self Care | Payer: BLUE CROSS/BLUE SHIELD | Source: Ambulatory Visit | Attending: Family Medicine | Admitting: Family Medicine

## 2016-12-08 DIAGNOSIS — Z1231 Encounter for screening mammogram for malignant neoplasm of breast: Secondary | ICD-10-CM

## 2016-12-15 ENCOUNTER — Other Ambulatory Visit: Payer: Self-pay | Admitting: Family Medicine

## 2016-12-21 ENCOUNTER — Other Ambulatory Visit: Payer: Self-pay | Admitting: Family Medicine

## 2017-01-03 ENCOUNTER — Other Ambulatory Visit: Payer: Self-pay | Admitting: Family Medicine

## 2017-02-23 ENCOUNTER — Encounter: Payer: BLUE CROSS/BLUE SHIELD | Admitting: Family Medicine

## 2017-03-01 ENCOUNTER — Other Ambulatory Visit: Payer: Self-pay | Admitting: Family Medicine

## 2017-03-01 DIAGNOSIS — E039 Hypothyroidism, unspecified: Secondary | ICD-10-CM

## 2017-03-02 ENCOUNTER — Encounter: Payer: Self-pay | Admitting: Internal Medicine

## 2017-03-04 ENCOUNTER — Other Ambulatory Visit: Payer: Self-pay | Admitting: Family Medicine

## 2017-03-04 DIAGNOSIS — I10 Essential (primary) hypertension: Secondary | ICD-10-CM

## 2017-03-15 ENCOUNTER — Other Ambulatory Visit: Payer: Self-pay | Admitting: Family Medicine

## 2017-03-16 ENCOUNTER — Encounter: Payer: Self-pay | Admitting: Family Medicine

## 2017-03-16 ENCOUNTER — Ambulatory Visit (INDEPENDENT_AMBULATORY_CARE_PROVIDER_SITE_OTHER): Payer: BLUE CROSS/BLUE SHIELD | Admitting: Family Medicine

## 2017-03-16 VITALS — BP 128/78 | HR 54 | Temp 98.7°F | Ht 64.0 in | Wt 237.0 lb

## 2017-03-16 DIAGNOSIS — I10 Essential (primary) hypertension: Secondary | ICD-10-CM | POA: Diagnosis not present

## 2017-03-16 DIAGNOSIS — E559 Vitamin D deficiency, unspecified: Secondary | ICD-10-CM

## 2017-03-16 DIAGNOSIS — E039 Hypothyroidism, unspecified: Secondary | ICD-10-CM | POA: Diagnosis not present

## 2017-03-16 DIAGNOSIS — Z0000 Encounter for general adult medical examination without abnormal findings: Secondary | ICD-10-CM

## 2017-03-16 LAB — CBC WITH DIFFERENTIAL/PLATELET
BASOS PCT: 0.6 % (ref 0.0–3.0)
Basophils Absolute: 0 10*3/uL (ref 0.0–0.1)
EOS ABS: 0.1 10*3/uL (ref 0.0–0.7)
EOS PCT: 1.1 % (ref 0.0–5.0)
HEMATOCRIT: 41.6 % (ref 36.0–46.0)
HEMOGLOBIN: 13.8 g/dL (ref 12.0–15.0)
LYMPHS PCT: 38.4 % (ref 12.0–46.0)
Lymphs Abs: 3 10*3/uL (ref 0.7–4.0)
MCHC: 33.2 g/dL (ref 30.0–36.0)
MCV: 91.1 fl (ref 78.0–100.0)
Monocytes Absolute: 0.6 10*3/uL (ref 0.1–1.0)
Monocytes Relative: 7.3 % (ref 3.0–12.0)
Neutro Abs: 4 10*3/uL (ref 1.4–7.7)
Neutrophils Relative %: 52.6 % (ref 43.0–77.0)
Platelets: 548 10*3/uL — ABNORMAL HIGH (ref 150.0–400.0)
RBC: 4.57 Mil/uL (ref 3.87–5.11)
RDW: 13.9 % (ref 11.5–15.5)
WBC: 7.7 10*3/uL (ref 4.0–10.5)

## 2017-03-16 LAB — VITAMIN D 25 HYDROXY (VIT D DEFICIENCY, FRACTURES): VITD: 37.88 ng/mL (ref 30.00–100.00)

## 2017-03-16 LAB — LIPID PANEL
CHOLESTEROL: 169 mg/dL (ref 0–200)
HDL: 54.1 mg/dL (ref >39.00)
LDL Cholesterol: 96 mg/dL (ref 0–99)
NonHDL: 114.95
TRIGLYCERIDES: 96 mg/dL (ref 0.0–149.0)
Total CHOL/HDL Ratio: 3
VLDL: 19.2 mg/dL (ref 0.0–40.0)

## 2017-03-16 LAB — COMPREHENSIVE METABOLIC PANEL
ALK PHOS: 117 U/L (ref 39–117)
ALT: 16 U/L (ref 0–35)
AST: 22 U/L (ref 0–37)
Albumin: 4.1 g/dL (ref 3.5–5.2)
BUN: 12 mg/dL (ref 6–23)
CALCIUM: 8.9 mg/dL (ref 8.4–10.5)
CHLORIDE: 100 meq/L (ref 96–112)
CO2: 33 mEq/L — ABNORMAL HIGH (ref 19–32)
CREATININE: 0.56 mg/dL (ref 0.40–1.20)
GFR: 146.28 mL/min (ref >60.00)
Glucose, Bld: 98 mg/dL (ref 70–99)
Potassium: 3.4 mEq/L — ABNORMAL LOW (ref 3.5–5.1)
Sodium: 139 mEq/L (ref 135–145)
TOTAL PROTEIN: 7.2 g/dL (ref 6.0–8.3)
Total Bilirubin: 0.4 mg/dL (ref 0.2–1.2)

## 2017-03-16 LAB — POC URINALSYSI DIPSTICK (AUTOMATED)
Bilirubin, UA: NEGATIVE
Glucose, UA: NEGATIVE
KETONES UA: NEGATIVE
Leukocytes, UA: NEGATIVE
Nitrite, UA: NEGATIVE
PH UA: 6 (ref 5.0–8.0)
PROTEIN UA: NEGATIVE
RBC UA: NEGATIVE
SPEC GRAV UA: 1.02 (ref 1.010–1.025)
UROBILINOGEN UA: 0.2 U/dL

## 2017-03-16 LAB — TSH: TSH: 1.01 u[IU]/mL (ref 0.35–4.50)

## 2017-03-16 NOTE — Progress Notes (Signed)
Subjective:     [name] is a 51 y.o. female and is here for a comprehensive physical exam. The patient reports problems - labs and med refills .  Social History   Social History  . Marital status: Married    Spouse name: N/A  . Number of children: 3  . Years of education: N/A   Occupational History  . 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 Smoking status: Never Smoker  . Smokeless tobacco: Never Used     Comment: Never Used Tobacco  . Alcohol use No  . Drug use: No  . Sexual activity: Yes    Partners: Male   Other Topics Concern  . Not on file   Social History Narrative  . No narrative on file   Health Maintenance  Topic Date Due  . COLONOSCOPY  02/21/2017  . INFLUENZA VACCINE  03/16/2018 (Originally 12/24/2016)  . MAMMOGRAM  12/08/2017  . TETANUS/TDAP  02/08/2019  . HIV Screening  Completed    The following portions of the patient's history were reviewed and updated as appropriate:  She  has a past medical history of Allergic rhinitis, cause unspecified; Anemia, iron deficiency; COPD (chronic obstructive pulmonary disease) ([place]); Diastolic dysfunction; Diverticulosis of colon; GERD (gastroesophageal reflux disease); Goiter; Hiatal hernia; Hypertension; IBS (irritable bowel syndrome); Morbid obesity ([place]); Obstructive sleep apnea; Thrombocytosis ([place]); and Undiagnosed cardiac murmurs. She  does not have any pertinent problems on file. She  has a past surgical history that includes Lipoma excision; Endometrial ablation (12/08); Bariatric Surgery (06/17/2010); Spine surgery (02/10/2012); Laparoscopic assisted vaginal hysterectomy (N/A, 06/22/2014); Bilateral salpingectomy (Bilateral, 06/22/2014); and Cystoscopy (N/A, 06/22/2014). Her family history includes Arrhythmia in her mother; Breast cancer in her mother; Cancer in her father; Cancer (age of onset: 14) in her maternal aunt; Cancer (age of onset: 29) in her mother; Colon polyps in her father; Diabetes  Mellitus II in her mother; Heart disease in her mother; Hypertension in her father; Kidney cancer in her father; Lymphoma in her father. She  reports that she has never smoked. She has never used smokeless tobacco. She reports that she does not drink alcohol or use drugs. She has a current medication list which includes the following prescription(s): biotin, celecoxib, cyanocobalamin, cyclobenzaprine, glucosamine sulfate, hydrochlorothiazide, ibuprofen, ipratropium, klor-con m20, levothyroxine, levothyroxine, loratadine, losartan, magnesium, omeprazole, and vitamin d (ergocalciferol). Current Outpatient Prescriptions on File Prior to Visit  Medication Sig Dispense Refill  . Biotin 10 MG CAPS biotin    . celecoxib (CELEBREX) 200 MG capsule Take 200 mg by mouth daily.  1  . Cyanocobalamin (VITAMIN B-12 PO) Take 1 tablet by mouth daily.    . cyclobenzaprine (FLEXERIL) 10 MG tablet Take 10 mg by mouth 3 (three) times daily as needed.  1  . Glucosamine Sulfate 1000 MG CAPS Take by mouth.    . hydrochlorothiazide (HYDRODIURIL) 25 MG tablet TAKE 1 TABLET BY MOUTH DAILY 90 tablet 0  . ibuprofen (ADVIL,MOTRIN) 600 MG tablet 1  po  pc  every 6 hours x 5 days then as needed for pain 30 tablet 1  . ipratropium (ATROVENT) 0.03 % nasal spray     . KLOR-CON M20 20 MEQ tablet TAKE 1 TABLET BY MOUTH EVERY DAY 90 tablet 0  . levothyroxine (SYNTHROID, LEVOTHROID) 88 MCG tablet Take 1 tablet (88 mcg total) by mouth daily. 30 tablet 6  . levothyroxine (SYNTHROID, LEVOTHROID) 88 MCG tablet TAKE 1 TABLET BY MOUTH EVERY DAY 30 tablet 0  . loratadine (CLARITIN)  10 MG tablet TAKE 1 TABLET BY MOUTH EVERY DAY 90 tablet 2  . losartan (COZAAR) 100 MG tablet TAKE 1 TABLET BY MOUTH EVERY DAY 90 tablet 1  . magnesium 30 MG tablet Take 30 mg by mouth 2 (two) times daily.    Marland Kitchen omeprazole (PRILOSEC) 40 MG capsule Take 1 capsule (40 mg total) by mouth daily. 90 capsule 1  . Vitamin D, Ergocalciferol, (DRISDOL) 50000 units CAPS  capsule TAKE ONE CAPSULE BY MOUTH WEEKLY 12 capsule 1   No current facility-administered medications on file prior to visit.    She is allergic to penicillins..  Review of Systems Review of Systems  Constitutional: Negative for activity change, appetite change and fatigue.  HENT: Negative for hearing loss, congestion, tinnitus and ear discharge.  dentist q35m Eyes: Negative for visual disturbance (see optho q1y -- vision corrected to 20/20 with glasses).  Respiratory: Negative for cough, chest tightness and shortness of breath.   Cardiovascular: Negative for chest pain, palpitations and leg swelling.  Gastrointestinal: Negative for abdominal pain, diarrhea, constipation and abdominal distention.  Genitourinary: Negative for urgency, frequency, decreased urine volume and difficulty urinating.  Musculoskeletal: Negative for back pain, arthralgias and gait problem.  Skin: Negative for color change, pallor and rash.  Neurological: Negative for dizziness, light-headedness, numbness and headaches.  Hematological: Negative for adenopathy. Does not bruise/bleed easily.  Psychiatric/Behavioral: Negative for suicidal ideas, confusion, sleep disturbance, self-injury, dysphoric mood, decreased concentration and agitation.       Objective:    BP 128/78   Pulse (!) 54   Temp 98.7 F (37.1 C) (Oral)   Ht 5\' 4"  (1.626 m)   Wt 237 lb (107.5 kg)   LMP 01/20/2011   SpO2 98%   BMI 40.68 kg/m  General appearance: alert, cooperative, appears stated age and no distress Head: Normocephalic, without obvious abnormality, atraumatic Eyes: negative findings: lids and lashes normal and pupils equal, round, reactive to light and accomodation Ears: normal TM's and external ear canals both ears Nose: Nares normal. Septum midline. Mucosa normal. No drainage or sinus tenderness. Throat: lips, mucosa, and tongue normal; teeth and gums normal Neck: no adenopathy, no carotid bruit, no JVD, supple, symmetrical,  trachea midline and thyroid not enlarged, symmetric, no tenderness/mass/nodules Back: symmetric, no curvature. ROM normal. No CVA tenderness. Lungs: clear to auscultation bilaterally Breasts: gyn Heart: regular rate and rhythm, S1, S2 normal, no murmur, click, rub or gallop Abdomen: soft, non-tender; bowel sounds normal; no masses,  no organomegaly Pelvic: deferred---gyn  Extremities: extremities normal, atraumatic, no cyanosis or edema Pulses: 2+ and symmetric Skin: Skin color, texture, turgor normal. No rashes or lesions Lymph nodes: Cervical, supraclavicular, and axillary nodes normal. Neurologic: Alert and oriented X 3, normal strength and tone. Normal symmetric reflexes. Normal coordination and gait    Assessment:    Healthy female exam.      Plan:    ghm utd Check labs  See After Visit Summary for Counseling Recommendations    1. Preventative health care See above - Ambulatory referral to Gastroenterology - Lipid panel - TSH - CBC with Differential/Platelet - Comprehensive metabolic panel - POCT Urinalysis Dipstick (Automated)  2. Hypothyroidism, unspecified type con't meds - TSH  3. Essential hypertension Well controlled, no changes to meds. Encouraged heart healthy diet such as the DASH diet and exercise as tolerated.  - CBC with Differential/Platelet - Comprehensive metabolic panel - POCT Urinalysis Dipstick (Automated)  4. Vitamin D deficiency  - Vitamin D (25 hydroxy)

## 2017-03-16 NOTE — Patient Instructions (Signed)
Preventive Care 40-64 Years, Female Preventive care refers to lifestyle choices and visits with your health care provider that can promote health and wellness. What does preventive care include?  A yearly physical exam. This is also called an annual well check.  Dental exams once or twice a year.  Routine eye exams. Ask your health care provider how often you should have your eyes checked.  Personal lifestyle choices, including: ? Daily care of your teeth and gums. ? Regular physical activity. ? Eating a healthy diet. ? Avoiding tobacco and drug use. ? Limiting alcohol use. ? Practicing safe sex. ? Taking low-dose aspirin daily starting at age 52. ? Taking vitamin and mineral supplements as recommended by your health care provider. What happens during an annual well check? The services and screenings done by your health care provider during your annual well check will depend on your age, overall health, lifestyle risk factors, and family history of disease. Counseling Your health care provider may ask you questions about your:  Alcohol use.  Tobacco use.  Drug use.  Emotional well-being.  Home and relationship well-being.  Sexual activity.  Eating habits.  Work and work Statistician.  Method of birth control.  Menstrual cycle.  Pregnancy history.  Screening You may have the following tests or measurements:  Height, weight, and BMI.  Blood pressure.  Lipid and cholesterol levels. These may be checked every 5 years, or more frequently if you are over 52 years old.  Skin check.  Lung cancer screening. You may have this screening every year starting at age 52 if you have a 30-pack-year history of smoking and currently smoke or have quit within the past 15 years.  Fecal occult blood test (FOBT) of the stool. You may have this test every year starting at age 52.  Flexible sigmoidoscopy or colonoscopy. You may have a sigmoidoscopy every 5 years or a colonoscopy  every 10 years starting at age 52.  Hepatitis C blood test.  Hepatitis B blood test.  Sexually transmitted disease (STD) testing.  Diabetes screening. This is done by checking your blood sugar (glucose) after you have not eaten for a while (fasting). You may have this done every 1-3 years.  Mammogram. This may be done every 1-2 years. Talk to your health care provider about when you should start having regular mammograms. This may depend on whether you have a family history of breast cancer.  BRCA-related cancer screening. This may be done if you have a family history of breast, ovarian, tubal, or peritoneal cancers.  Pelvic exam and Pap test. This may be done every 3 years starting at age 52. Starting at age 36, this may be done every 5 years if you have a Pap test in combination with an HPV test.  Bone density scan. This is done to screen for osteoporosis. You may have this scan if you are at high risk for osteoporosis.  Discuss your test results, treatment options, and if necessary, the need for more tests with your health care provider. Vaccines Your health care provider may recommend certain vaccines, such as:  Influenza vaccine. This is recommended every year.  Tetanus, diphtheria, and acellular pertussis (Tdap, Td) vaccine. You may need a Td booster every 10 years.  Varicella vaccine. You may need this if you have not been vaccinated.  Zoster vaccine. You may need this after age 5.  Measles, mumps, and rubella (MMR) vaccine. You may need at least one dose of MMR if you were born in  1958 or later. You may also need a second dose.  Pneumococcal 13-valent conjugate (PCV13) vaccine. You may need this if you have certain conditions and were not previously vaccinated.  Pneumococcal polysaccharide (PPSV23) vaccine. You may need one or two doses if you smoke cigarettes or if you have certain conditions.  Meningococcal vaccine. You may need this if you have certain  conditions.  Hepatitis A vaccine. You may need this if you have certain conditions or if you travel or work in places where you may be exposed to hepatitis A.  Hepatitis B vaccine. You may need this if you have certain conditions or if you travel or work in places where you may be exposed to hepatitis B.  Haemophilus influenzae type b (Hib) vaccine. You may need this if you have certain conditions.  Talk to your health care provider about which screenings and vaccines you need and how often you need them. This information is not intended to replace advice given to you by your health care provider. Make sure you discuss any questions you have with your health care provider. Document Released: 06/08/2015 Document Revised: 01/30/2016 Document Reviewed: 03/13/2015 Elsevier Interactive Patient Education  2017 [employer].

## 2017-03-21 ENCOUNTER — Encounter: Payer: Self-pay | Admitting: Family Medicine

## 2017-03-24 ENCOUNTER — Other Ambulatory Visit: Payer: Self-pay

## 2017-03-30 ENCOUNTER — Other Ambulatory Visit: Payer: Self-pay | Admitting: Family Medicine

## 2017-03-30 DIAGNOSIS — E039 Hypothyroidism, unspecified: Secondary | ICD-10-CM

## 2017-04-02 ENCOUNTER — Other Ambulatory Visit: Payer: Self-pay | Admitting: Family Medicine

## 2017-04-27 ENCOUNTER — Ambulatory Visit (HOSPITAL_BASED_OUTPATIENT_CLINIC_OR_DEPARTMENT_OTHER): Payer: BLUE CROSS/BLUE SHIELD | Admitting: Family

## 2017-04-27 ENCOUNTER — Other Ambulatory Visit (HOSPITAL_BASED_OUTPATIENT_CLINIC_OR_DEPARTMENT_OTHER): Payer: BLUE CROSS/BLUE SHIELD

## 2017-04-27 VITALS — BP 105/58 | HR 70 | Temp 97.9°F | Wt 235.5 lb

## 2017-04-27 DIAGNOSIS — K909 Intestinal malabsorption, unspecified: Secondary | ICD-10-CM

## 2017-04-27 DIAGNOSIS — D473 Essential (hemorrhagic) thrombocythemia: Secondary | ICD-10-CM

## 2017-04-27 DIAGNOSIS — Z9884 Bariatric surgery status: Secondary | ICD-10-CM | POA: Diagnosis not present

## 2017-04-27 DIAGNOSIS — D75839 Thrombocytosis, unspecified: Secondary | ICD-10-CM

## 2017-04-27 DIAGNOSIS — D508 Other iron deficiency anemias: Secondary | ICD-10-CM

## 2017-04-27 DIAGNOSIS — Z807 Family history of other malignant neoplasms of lymphoid, hematopoietic and related tissues: Secondary | ICD-10-CM | POA: Diagnosis not present

## 2017-04-27 LAB — CBC WITH DIFFERENTIAL (CANCER CENTER ONLY)
BASO#: 0 10*3/uL (ref 0.0–0.2)
BASO%: 0.1 % (ref 0.0–2.0)
EOS ABS: 0.1 10*3/uL (ref 0.0–0.5)
EOS%: 1.1 % (ref 0.0–7.0)
HEMATOCRIT: 41.3 % (ref 34.8–46.6)
HEMOGLOBIN: 13.8 g/dL (ref 11.6–15.9)
LYMPH#: 2.8 10*3/uL (ref 0.9–3.3)
LYMPH%: 40.1 % (ref 14.0–48.0)
MCH: 30.1 pg (ref 26.0–34.0)
MCHC: 33.4 g/dL (ref 32.0–36.0)
MCV: 90 fL (ref 81–101)
MONO#: 0.5 10*3/uL (ref 0.1–0.9)
MONO%: 7.4 % (ref 0.0–13.0)
NEUT%: 51.3 % (ref 39.6–80.0)
NEUTROS ABS: 3.6 10*3/uL (ref 1.5–6.5)
PLATELETS: 431 10*3/uL — AB (ref 145–400)
RBC: 4.59 10*6/uL (ref 3.70–5.32)
RDW: 13.5 % (ref 11.1–15.7)
WBC: 7 10*3/uL (ref 3.9–10.0)

## 2017-04-27 LAB — COMPREHENSIVE METABOLIC PANEL
ALBUMIN: 4 g/dL (ref 3.5–5.0)
ALK PHOS: 139 U/L (ref 40–150)
ALT: 19 U/L (ref 0–55)
ANION GAP: 9 meq/L (ref 3–11)
AST: 25 U/L (ref 5–34)
BILIRUBIN TOTAL: 0.58 mg/dL (ref 0.20–1.20)
BUN: 12.2 mg/dL (ref 7.0–26.0)
CO2: 26 meq/L (ref 22–29)
CREATININE: 0.7 mg/dL (ref 0.6–1.1)
Calcium: 9.1 mg/dL (ref 8.4–10.4)
Chloride: 102 mEq/L (ref 98–109)
EGFR: >60 mL/min/{1.73_m2} (ref >60)
Glucose: 85 mg/dl (ref 70–140)
Potassium: 3.3 mEq/L — ABNORMAL LOW (ref 3.5–5.1)
Sodium: 138 mEq/L (ref 136–145)
TOTAL PROTEIN: 7.5 g/dL (ref 6.4–8.3)

## 2017-04-27 LAB — LACTATE DEHYDROGENASE: LDH: 243 U/L (ref 125–245)

## 2017-04-27 LAB — IRON AND TIBC
%SAT: 44 % (ref 21–57)
Iron: 128 ug/dL (ref 41–142)
TIBC: 288 ug/dL (ref 236–444)
UIBC: 160 ug/dL (ref 120–384)

## 2017-04-27 LAB — FERRITIN: FERRITIN: 62 ng/mL (ref 9–269)

## 2017-04-27 NOTE — Progress Notes (Signed)
Hematology and Oncology Follow Up Visit  [name] [id number] [birth year] 51 y.o. 04/27/2017   Principle Diagnosis:  1. Thrombocytosis 2. Family history of lymphoma (father) 3. Iron deficiency anemia secondary to malabsorption with bariatric surgery (2012)   Current Therapy:   Observation IV iron as indicated    Interim History:  [name] is here today for follow-up. She is still having a good deal of generalized bone aches and pains. She states that she follows up with her rheumatologist on December 19th for further work up for a possible connective tissue disorder.  She has intermittent fatigue at times. Platelet count today is 431. No anemia, WBC count is 7.0. No episodes of bleeding, no petechiae. She does bruise easily but not in excess.  No fever, chills, n/v, cough, rash, dizziness, SOB, chest pain, palpitations, abdominal pain or changes in bowel or bladder habits.  She is still having urinary frequency and states Dr. Etter Sjogren recently checked her urine in October. She monitors her urine for protein and also manages her Mead for intermittently low potassium.  No swelling in her extremities. She has numbness and tingling in both arms she states is due to a bulging disc in the neck. No falls or syncopal episodes. No lymphadenopathy found on exam.  She has a good appetite and is staying well hydrated. Her weight is stable.   ECOG Performance Status: 1 - Symptomatic but completely ambulatory  Medications:  Allergies as of 04/27/2017      Reactions   Penicillins Other (See Comments)   Causes severe, difficult to treat yeast infections      Medication List        Accurate as of 04/27/17 10:23 AM. Always use your most recent med list.          Biotin 10 MG Caps biotin   celecoxib 200 MG capsule Commonly known as:  CELEBREX Take 200 mg by mouth daily.   cyclobenzaprine 10 MG tablet Commonly known as:  FLEXERIL Take 10 mg by mouth 3 (three) times daily as needed.     Glucosamine Sulfate 1000 MG Caps Take by mouth.   hydrochlorothiazide 25 MG tablet Commonly known as:  HYDRODIURIL TAKE 1 TABLET BY MOUTH EVERY DAY   ibuprofen 600 MG tablet Commonly known as:  ADVIL,MOTRIN 1  po  pc  every 6 hours x 5 days then as needed for pain   ipratropium 0.03 % nasal spray Commonly known as:  ATROVENT   KLOR-CON M20 20 MEQ tablet Generic drug:  potassium chloride SA TAKE 1 TABLET BY MOUTH EVERY DAY   levothyroxine 88 MCG tablet Commonly known as:  SYNTHROID, LEVOTHROID Take 1 tablet (88 mcg total) by mouth daily.   levothyroxine 88 MCG tablet Commonly known as:  SYNTHROID, LEVOTHROID TAKE 1 TABLET BY MOUTH EVERY DAY   loratadine 10 MG tablet Commonly known as:  CLARITIN TAKE 1 TABLET BY MOUTH EVERY DAY   losartan 100 MG tablet Commonly known as:  COZAAR TAKE 1 TABLET BY MOUTH EVERY DAY   magnesium 30 MG tablet Take 30 mg by mouth 2 (two) times daily.   omeprazole 40 MG capsule Commonly known as:  PRILOSEC Take 1 capsule (40 mg total) by mouth daily.   VITAMIN B-12 PO Take 1 tablet by mouth daily.   Vitamin D (Ergocalciferol) 50000 units Caps capsule Commonly known as:  DRISDOL TAKE ONE CAPSULE BY MOUTH WEEKLY       Allergies:  Allergies  Allergen Reactions  . Penicillins Other (See  Comments)    Causes severe, difficult to treat yeast infections    Past Medical History, Surgical history, Social history, and Family History were reviewed and updated.  Review of Systems: All other 10 point review of systems is negative.   Physical Exam:  weight is 235 lb 8 oz (106.8 kg). Her oral temperature is 97.9 F (36.6 C). Her blood pressure is 105/58 (abnormal) and her pulse is 70.   Wt Readings from Last 3 Encounters:  04/27/17 235 lb 8 oz (106.8 kg)  03/16/17 237 lb (107.5 kg)  10/21/16 240 lb 6.4 oz (109 kg)    Ocular: Sclerae unicteric, pupils equal, round and reactive to light Ear-nose-throat: Oropharynx clear, dentition  fair Lymphatic: No cervical, supraclavicular or axillary adenopathy Lungs no rales or rhonchi, good excursion bilaterally Heart regular rate and rhythm, no murmur appreciated Abd soft, nontender, positive bowel sounds, no liver or spleen tip palpated on exam, no fluid wave MSK no focal spinal tenderness, no joint edema Neuro: non-focal, well-oriented, appropriate affect Breasts: Deferred   Lab Results  Component Value Date   WBC 7.0 04/27/2017   HGB 13.8 04/27/2017   HCT 41.3 04/27/2017   MCV 90 04/27/2017   PLT 431 (H) 04/27/2017   Lab Results  Component Value Date   FERRITIN 19 10/21/2016   IRON 62 10/21/2016   TIBC 336 10/21/2016   UIBC 274 10/21/2016   IRONPCTSAT 18 (L) 10/21/2016   Lab Results  Component Value Date   RETICCTPCT 1.8 04/19/2014   RBC 4.59 04/27/2017   RETICCTABS 79.2 04/19/2014   No results found for: KPAFRELGTCHN, LAMBDASER, KAPLAMBRATIO No results found for: IGGSERUM, IGA, IGMSERUM No results found for: Odetta Pink, SPEI   Chemistry      Component Value Date/Time   NA 139 03/16/2017 1357   NA 140 10/21/2016 0920   K 3.4 (L) 03/16/2017 1357   K 3.8 10/21/2016 0920   CL 100 03/16/2017 1357   CO2 33 (H) 03/16/2017 1357   CO2 29 10/21/2016 0920   BUN 12 03/16/2017 1357   BUN 13.5 10/21/2016 0920   CREATININE 0.56 03/16/2017 1357   CREATININE 0.7 10/21/2016 0920      Component Value Date/Time   CALCIUM 8.9 03/16/2017 1357   CALCIUM 9.4 10/21/2016 0920   ALKPHOS 117 03/16/2017 1357   ALKPHOS 155 (H) 10/21/2016 0920   AST 22 03/16/2017 1357   AST 24 10/21/2016 0920   ALT 16 03/16/2017 1357   ALT 21 10/21/2016 0920   BILITOT 0.4 03/16/2017 1357   BILITOT 0.42 10/21/2016 0920      Impression and Plan: [name] is a very pleasant 51 yo African American female with history of mild thrombocytosis and iron deficiency secondary to malabsorption after having bariatric surgery in 2012.  Her  platelet count is stable at 431. No anemia and WBC count is 7.0.  She has some fatigue at times. We will see what her iron studies show and bring her back in later this week for an infusion if needed.  We will go ahead and plan to see her back again in another 6 months for follow-up.  She will contact our office with any questions or concerns. We can certainly see her sooner if need be.   Eliezer Bottom, NP 12/3/201810:23 AM

## 2017-05-18 ENCOUNTER — Encounter: Payer: Self-pay | Admitting: Hematology & Oncology

## 2017-05-25 ENCOUNTER — Encounter: Payer: Self-pay | Admitting: Family Medicine

## 2017-05-31 ENCOUNTER — Other Ambulatory Visit: Payer: Self-pay | Admitting: Family Medicine

## 2017-06-02 ENCOUNTER — Other Ambulatory Visit: Payer: Self-pay | Admitting: Family Medicine

## 2017-06-02 ENCOUNTER — Encounter: Payer: Self-pay | Admitting: Family Medicine

## 2017-06-02 DIAGNOSIS — Z9109 Other allergy status, other than to drugs and biological substances: Secondary | ICD-10-CM

## 2017-06-04 NOTE — Telephone Encounter (Signed)
Only certain generic brands were recalled-- she will need to call the pharmacy

## 2017-06-20 ENCOUNTER — Other Ambulatory Visit: Payer: Self-pay | Admitting: Family Medicine

## 2017-07-02 ENCOUNTER — Other Ambulatory Visit: Payer: Self-pay | Admitting: Family Medicine

## 2017-08-23 ENCOUNTER — Other Ambulatory Visit: Payer: Self-pay | Admitting: Family Medicine

## 2017-08-23 DIAGNOSIS — I10 Essential (primary) hypertension: Secondary | ICD-10-CM

## 2017-09-13 ENCOUNTER — Other Ambulatory Visit: Payer: Self-pay | Admitting: Family Medicine

## 2017-09-14 ENCOUNTER — Encounter: Payer: Self-pay | Admitting: Family Medicine

## 2017-09-14 ENCOUNTER — Ambulatory Visit: Payer: BLUE CROSS/BLUE SHIELD | Admitting: Family Medicine

## 2017-09-14 VITALS — BP 130/80 | HR 54 | Temp 97.7°F | Resp 16 | Ht 64.0 in | Wt 239.4 lb

## 2017-09-14 DIAGNOSIS — R0602 Shortness of breath: Secondary | ICD-10-CM | POA: Diagnosis not present

## 2017-09-14 DIAGNOSIS — J302 Other seasonal allergic rhinitis: Secondary | ICD-10-CM

## 2017-09-14 DIAGNOSIS — M79601 Pain in right arm: Secondary | ICD-10-CM

## 2017-09-14 DIAGNOSIS — E538 Deficiency of other specified B group vitamins: Secondary | ICD-10-CM

## 2017-09-14 DIAGNOSIS — R079 Chest pain, unspecified: Secondary | ICD-10-CM | POA: Diagnosis not present

## 2017-09-14 DIAGNOSIS — E039 Hypothyroidism, unspecified: Secondary | ICD-10-CM

## 2017-09-14 DIAGNOSIS — E785 Hyperlipidemia, unspecified: Secondary | ICD-10-CM

## 2017-09-14 LAB — COMPREHENSIVE METABOLIC PANEL
ALK PHOS: 128 U/L — AB (ref 39–117)
ALT: 18 U/L (ref 0–35)
AST: 23 U/L (ref 0–37)
Albumin: 4.1 g/dL (ref 3.5–5.2)
BILIRUBIN TOTAL: 0.4 mg/dL (ref 0.2–1.2)
BUN: 9 mg/dL (ref 6–23)
CO2: 28 mEq/L (ref 19–32)
Calcium: 9.3 mg/dL (ref 8.4–10.5)
Chloride: 100 mEq/L (ref 96–112)
Creatinine, Ser: 0.58 mg/dL (ref 0.40–1.20)
GFR: 140.2 mL/min (ref >60.00)
GLUCOSE: 89 mg/dL (ref 70–99)
Potassium: 3.8 mEq/L (ref 3.5–5.1)
SODIUM: 138 meq/L (ref 135–145)
TOTAL PROTEIN: 7.1 g/dL (ref 6.0–8.3)

## 2017-09-14 LAB — LIPID PANEL
CHOL/HDL RATIO: 3
Cholesterol: 174 mg/dL (ref 0–200)
HDL: 61 mg/dL (ref >39.00)
LDL Cholesterol: 101 mg/dL — ABNORMAL HIGH (ref 0–99)
NONHDL: 112.55
Triglycerides: 60 mg/dL (ref 0.0–149.0)
VLDL: 12 mg/dL (ref 0.0–40.0)

## 2017-09-14 LAB — TSH: TSH: 1.14 u[IU]/mL (ref 0.35–4.50)

## 2017-09-14 LAB — VITAMIN B12: VITAMIN B 12: 738 pg/mL (ref 211–911)

## 2017-09-14 MED ORDER — IPRATROPIUM BROMIDE 0.03 % NA SOLN: 1.0000 | Freq: Two times a day (BID) | NASAL | 5 refills | Status: DC

## 2017-09-14 NOTE — Progress Notes (Signed)
Patient ID: [name], female    DOB: Jul 26, 1965  Age: 52 y.o. MRN: [id number]   t  Subjective:  Subjective  HPI [name] presents for f/u cholesterol , thyroid and b 12.  She feels her b12 may be low again.  Pt also c/o "a clot" in her arm that she and her husband watched travel down arm to R hand and it dispersed in her mid finger and then was sore.  She also c/o sob and some chest tightness at the time.  Pt had echo and stress test 5 years ago -- w/u normal.  She has not had any more episodes since then   Review of Systems  Constitutional: Negative for activity change, appetite change, chills, fatigue, fever and unexpected weight change.  HENT: Negative for congestion and hearing loss.   Eyes: Negative for discharge.  Respiratory: Negative for cough and shortness of breath.   Cardiovascular: Negative for chest pain, palpitations and leg swelling.  Gastrointestinal: Negative for abdominal pain, blood in stool, constipation, diarrhea, nausea and vomiting.  Genitourinary: Negative for dysuria, frequency, hematuria and urgency.  Musculoskeletal: Negative for back pain and myalgias.  Skin: Negative for rash.  Allergic/Immunologic: Negative for environmental allergies.  Neurological: Negative for dizziness, weakness and headaches.  Hematological: Does not bruise/bleed easily.  Psychiatric/Behavioral: Negative for behavioral problems, dysphoric mood and suicidal ideas. The patient is not nervous/anxious.     History Past Medical History:  Diagnosis Date  . Allergic rhinitis, cause unspecified   . Anemia, iron deficiency   . COPD (chronic obstructive pulmonary disease) ([place])   . Diastolic dysfunction   . Diverticulosis of colon   . GERD (gastroesophageal reflux disease)   . Goiter   . Hiatal hernia   . Hypertension   . IBS (irritable bowel syndrome)   . Morbid obesity ([place])   . Obstructive sleep apnea   . Thrombocytosis ([place])   . Undiagnosed cardiac murmurs     She has a  past surgical history that includes Lipoma excision; Endometrial ablation (12/08); Bariatric Surgery (06/17/2010); Spine surgery (02/10/2012); Laparoscopic assisted vaginal hysterectomy (N/A, 06/22/2014); Bilateral salpingectomy (Bilateral, 06/22/2014); and Cystoscopy (N/A, 06/22/2014).   Her family history includes Arrhythmia in her mother; Breast cancer in her mother; Cancer in her father; Cancer (age of onset: 92) in her maternal aunt; Cancer (age of onset: 75) in her mother; Colon polyps in her father; Diabetes Mellitus II in her mother; Heart disease in her mother; Hypertension in her father; Kidney cancer in her father; Lymphoma in her father.She reports that she has never smoked. She has never used smokeless tobacco. She reports that she does not drink alcohol or use drugs.  Current Outpatient Medications on File Prior to Visit  Medication Sig Dispense Refill  . Biotin 10 MG CAPS biotin    . celecoxib (CELEBREX) 200 MG capsule Take 200 mg by mouth daily.  1  . Cyanocobalamin (VITAMIN B-12 PO) Take 1 tablet by mouth daily.    . cyclobenzaprine (FLEXERIL) 10 MG tablet Take 10 mg by mouth 3 (three) times daily as needed.  1  . Glucosamine Sulfate 1000 MG CAPS Take by mouth.    . hydrochlorothiazide (HYDRODIURIL) 25 MG tablet TAKE 1 TABLET BY MOUTH EVERY DAY 90 tablet 0  . ibuprofen (ADVIL,MOTRIN) 600 MG tablet 1  po  pc  every 6 hours x 5 days then as needed for pain 30 tablet 1  . levothyroxine (SYNTHROID, LEVOTHROID) 88 MCG tablet Take 1 tablet (88 mcg total) by  mouth daily. 30 tablet 6  . levothyroxine (SYNTHROID, LEVOTHROID) 88 MCG tablet TAKE 1 TABLET BY MOUTH EVERY DAY 30 tablet 0  . loratadine (CLARITIN) 10 MG tablet TAKE 1 TABLET BY MOUTH EVERY DAY 90 tablet 2  . losartan (COZAAR) 100 MG tablet TAKE 1 TABLET BY MOUTH EVERY DAY 90 tablet 1  . magnesium 30 MG tablet Take 30 mg by mouth 2 (two) times daily.    Marland Kitchen omeprazole (PRILOSEC) 40 MG capsule Take 1 capsule (40 mg total) by mouth daily. 90  capsule 1  . Vitamin D, Ergocalciferol, (DRISDOL) 50000 units CAPS capsule TAKE ONE CAPSULE BY MOUTH ONE TIME PER WEEK 12 capsule 1  . KLOR-CON M20 20 MEQ tablet TAKE 1 TABLET BY MOUTH EVERY DAY 90 tablet 0   No current facility-administered medications on file prior to visit.      Objective:  Objective  Physical Exam  Constitutional: She is oriented to person, place, and time. She appears well-developed and well-nourished.  HENT:  Head: Normocephalic and atraumatic.  Eyes: Conjunctivae and EOM are normal.  Neck: Normal range of motion. Neck supple. No JVD present. Carotid bruit is not present. No thyromegaly present.  Cardiovascular: Normal rate, regular rhythm and normal heart sounds.  No murmur heard. Pulmonary/Chest: Effort normal and breath sounds normal. No respiratory distress. She has no wheezes. She has no rales. She exhibits no tenderness.  Abdominal: Soft.  Musculoskeletal: Normal range of motion. She exhibits no edema, tenderness or deformity.  Neurological: She is alert and oriented to person, place, and time. She exhibits normal muscle tone.  Skin: Skin is warm and dry. No rash noted. No erythema. No pallor.  Psychiatric: She has a normal mood and affect.  Nursing note and vitals reviewed.  BP 130/80 (BP Location: Left Arm, Cuff Size: Normal)   Pulse (!) 54   Temp 97.7 F (36.5 C) (Oral)   Resp 16   Ht 5\' 4"  (1.626 m)   Wt 239 lb 6.4 oz (108.6 kg)   LMP 01/20/2011   SpO2 99%   BMI 41.09 kg/m  Wt Readings from Last 3 Encounters:  09/14/17 239 lb 6.4 oz (108.6 kg)  04/27/17 235 lb 8 oz (106.8 kg)  03/16/17 237 lb (107.5 kg)     Lab Results  Component Value Date   WBC 7.0 04/27/2017   HGB 13.8 04/27/2017   HCT 41.3 04/27/2017   PLT 431 (H) 04/27/2017   GLUCOSE 85 04/27/2017   CHOL 169 03/16/2017   TRIG 96.0 03/16/2017   HDL 54.10 03/16/2017   LDLCALC 96 03/16/2017   ALT 19 04/27/2017   AST 25 04/27/2017   NA 138 04/27/2017   K 3.3 (L) 04/27/2017    CL 100 03/16/2017   CREATININE 0.7 04/27/2017   BUN 12.2 04/27/2017   CO2 26 04/27/2017   TSH 1.01 03/16/2017   HGBA1C 5.8 07/28/2014   MICROALBUR <0.7 08/19/2016    Mm Screening Breast [place] Bilateral  Result Date: 12/09/2016 CLINICAL DATA:  Screening. EXAM: 2D DIGITAL SCREENING BILATERAL MAMMOGRAM WITH CAD AND ADJUNCT TOMO COMPARISON:  Previous exam(s). ACR Breast Density Category b: There are scattered areas of fibroglandular density. FINDINGS: There are no findings suspicious for malignancy. Images were processed with CAD. IMPRESSION: No mammographic evidence of malignancy. A result letter of this screening mammogram will be mailed directly to the patient. RECOMMENDATION: Screening mammogram in one year. (Code:SM-B-01Y) BI-RADS CATEGORY  1: Negative. Electronically Signed   By: Fidela Salisbury M.D.   On: 12/09/2016  09:03     Assessment & Plan:  Plan  I have changed Armanii Burgner's ipratropium. I am also having her maintain her Cyanocobalamin (VITAMIN B-12 PO), ibuprofen, Glucosamine Sulfate, magnesium, omeprazole, cyclobenzaprine, levothyroxine, Biotin, celecoxib, levothyroxine, Vitamin D (Ergocalciferol), loratadine, hydrochlorothiazide, and losartan.  Meds ordered this encounter  Medications  . ipratropium (ATROVENT) 0.03 % nasal spray    Sig: Place 1 spray into both nostrils 2 (two) times daily.    Dispense:  30 mL    Refill:  5    Problem List Items Addressed This Visit      Unprioritized   CHEST PAIN   Relevant Orders   Ambulatory referral to Cardiology   EKG 12-Lead (Completed)   Hypothyroidism   Relevant Orders   TSH   Right arm pain    Pt is very concerned about a clot She has no pain today Will check [place] upper ext Pt to see cardiology and f/u rheum for connective tissue disease       Relevant Orders   VAS [place] UPPER EXTREMITY VENOUS DUPLEX    Other Visit Diagnoses    B12 deficiency    -  Primary   Relevant Orders   Vitamin B12   Hyperlipidemia LDL goal  <100       Relevant Orders   Comprehensive metabolic panel   Lipid panel   Seasonal allergies       Relevant Medications   ipratropium (ATROVENT) 0.03 % nasal spray   SOB (shortness of breath)       Relevant Orders   Ambulatory referral to Cardiology    pt instructed to go to ER if cp returns Pt had echo and stress test for same symptoms in 2015   Follow-up: Return if symptoms worsen or fail to improve, for annual exam, fasting.  Ann Held, DO

## 2017-09-14 NOTE — Assessment & Plan Note (Signed)
Pt is very concerned about a clot She has no pain today Will check [place] upper ext Pt to see cardiology and f/u rheum for connective tissue disease

## 2017-09-14 NOTE — Patient Instructions (Signed)
Chest Wall Pain °Chest wall pain is pain in or around the bones and muscles of your chest. Sometimes, an injury causes this pain. Sometimes, the cause may not be known. This pain may take several weeks or longer to get better. °Follow these instructions at home: °Pay attention to any changes in your symptoms. Take these actions to help with your pain: °· Rest as told by your health care provider. °· Avoid activities that cause pain. These include any activities that use your chest muscles or your abdominal and side muscles to lift heavy items. °· If directed, apply ice to the painful area: °? Put ice in a plastic bag. °? Place a towel between your skin and the bag. °? Leave the ice on for 20 minutes, 2-3 times per day. °· Take over-the-counter and prescription medicines only as told by your health care provider. °· Do not use tobacco products, including cigarettes, chewing tobacco, and e-cigarettes. If you need help quitting, ask your health care provider. °· Keep all follow-up visits as told by your health care provider. This is important. ° °Contact a health care provider if: °· You have a fever. °· Your chest pain becomes worse. °· You have new symptoms. °Get help right away if: °· You have nausea or vomiting. °· You feel sweaty or light-headed. °· You have a cough with phlegm (sputum) or you cough up blood. °· You develop shortness of breath. °This information is not intended to replace advice given to you by your health care provider. Make sure you discuss any questions you have with your health care provider. °Document Released: 05/12/2005 Document Revised: 09/20/2015 Document Reviewed: 08/07/2014 °Elsevier Interactive Patient Education © 2018 [employer] ° °

## 2017-09-23 ENCOUNTER — Encounter: Payer: Self-pay | Admitting: Family Medicine

## 2017-09-24 ENCOUNTER — Other Ambulatory Visit: Payer: Self-pay | Admitting: Family Medicine

## 2017-09-24 NOTE — Telephone Encounter (Signed)
Referral was put in on 22nd

## 2017-10-09 LAB — HM COLONOSCOPY

## 2017-10-13 ENCOUNTER — Ambulatory Visit (HOSPITAL_BASED_OUTPATIENT_CLINIC_OR_DEPARTMENT_OTHER)
Admission: RE | Admit: 2017-10-13 | Discharge: 2017-10-13 | Disposition: A | Discharge status: Home / Self Care | Payer: BLUE CROSS/BLUE SHIELD | Source: Ambulatory Visit | Attending: Family Medicine | Admitting: Family Medicine

## 2017-10-13 DIAGNOSIS — M79601 Pain in right arm: Secondary | ICD-10-CM

## 2017-10-13 NOTE — Progress Notes (Signed)
  Unilateral right upper extremity venous duplex exam performed. [name] 10/13/2017, 9:42 AM

## 2017-10-14 ENCOUNTER — Encounter: Payer: Self-pay | Admitting: Family Medicine

## 2017-10-26 ENCOUNTER — Encounter: Payer: Self-pay | Admitting: Family

## 2017-10-26 ENCOUNTER — Other Ambulatory Visit: Payer: Self-pay

## 2017-10-26 ENCOUNTER — Inpatient Hospital Stay: Payer: BLUE CROSS/BLUE SHIELD | Attending: Family | Admitting: Family

## 2017-10-26 ENCOUNTER — Inpatient Hospital Stay: Payer: BLUE CROSS/BLUE SHIELD

## 2017-10-26 VITALS — BP 131/77 | HR 50 | Temp 98.3°F | Resp 18 | Wt 239.0 lb

## 2017-10-26 DIAGNOSIS — K909 Intestinal malabsorption, unspecified: Secondary | ICD-10-CM | POA: Insufficient documentation

## 2017-10-26 DIAGNOSIS — D75839 Thrombocytosis, unspecified: Secondary | ICD-10-CM

## 2017-10-26 DIAGNOSIS — D473 Essential (hemorrhagic) thrombocythemia: Secondary | ICD-10-CM | POA: Diagnosis not present

## 2017-10-26 DIAGNOSIS — D509 Iron deficiency anemia, unspecified: Secondary | ICD-10-CM | POA: Diagnosis not present

## 2017-10-26 DIAGNOSIS — D508 Other iron deficiency anemias: Secondary | ICD-10-CM

## 2017-10-26 DIAGNOSIS — Z9884 Bariatric surgery status: Secondary | ICD-10-CM | POA: Diagnosis not present

## 2017-10-26 DIAGNOSIS — Z79899 Other long term (current) drug therapy: Secondary | ICD-10-CM | POA: Diagnosis not present

## 2017-10-26 LAB — CBC WITH DIFFERENTIAL (CANCER CENTER ONLY)
BASOS PCT: 0 %
Band Neutrophils: 0 %
Basophils Absolute: 0 10*3/uL (ref 0.0–0.1)
Blasts: 0 %
EOS PCT: 0 %
Eosinophils Absolute: 0 10*3/uL (ref 0.0–0.5)
HCT: 38.9 % (ref 34.8–46.6)
HEMOGLOBIN: 12.9 g/dL (ref 11.6–15.9)
LYMPHS ABS: 3.2 10*3/uL (ref 0.9–3.3)
Lymphocytes Relative: 31 %
MCH: 29.9 pg (ref 26.0–34.0)
MCHC: 33.2 g/dL (ref 32.0–36.0)
MCV: 90.3 fL (ref 81.0–101.0)
MONO ABS: 0.8 10*3/uL (ref 0.1–0.9)
MONOS PCT: 8 %
MYELOCYTES: 0 %
Metamyelocytes Relative: 0 %
NEUTROS ABS: 6.2 10*3/uL (ref 1.5–6.5)
NRBC: 0 /100{WBCs}
Neutrophils Relative %: 61 %
Other: 0 %
PLATELETS: 506 10*3/uL — AB (ref 145–400)
Promyelocytes Relative: 0 %
RBC: 4.31 MIL/uL (ref 3.70–5.32)
RDW: 13.3 % (ref 11.1–15.7)
WBC: 10.2 10*3/uL — AB (ref 3.9–10.0)

## 2017-10-26 LAB — CMP (CANCER CENTER ONLY)
ALK PHOS: 140 U/L (ref 40–150)
ALT: 13 U/L (ref 0–55)
ANION GAP: 8 (ref 3–11)
AST: 21 U/L (ref 5–34)
Albumin: 4 g/dL (ref 3.5–5.0)
BUN: 15 mg/dL (ref 7–26)
CALCIUM: 9.4 mg/dL (ref 8.4–10.4)
CHLORIDE: 102 mmol/L (ref 98–109)
CO2: 27 mmol/L (ref 22–29)
Creatinine: 0.75 mg/dL (ref 0.60–1.10)
GFR, Est AFR Am: >60 mL/min (ref >60)
GFR, Estimated: >60 mL/min (ref >60)
Glucose, Bld: 80 mg/dL (ref 70–140)
POTASSIUM: 3.8 mmol/L (ref 3.5–5.1)
SODIUM: 137 mmol/L (ref 136–145)
Total Bilirubin: 0.3 mg/dL (ref 0.2–1.2)
Total Protein: 7.4 g/dL (ref 6.4–8.3)

## 2017-10-26 LAB — IRON AND TIBC
IRON: 94 ug/dL (ref 41–142)
Saturation Ratios: 31 % (ref 21–57)
TIBC: 299 ug/dL (ref 236–444)
UIBC: 205 ug/dL

## 2017-10-26 LAB — FERRITIN: FERRITIN: 36 ng/mL (ref 9–269)

## 2017-10-26 LAB — LACTATE DEHYDROGENASE: LDH: 205 U/L (ref 125–245)

## 2017-10-26 NOTE — Progress Notes (Signed)
Hematology and Oncology Follow Up Visit  [name] [id number] November 15, 1965 52 y.o. 10/26/2017   Principle Diagnosis:  1. Thrombocytosis 2. Family history of lymphoma (father) 3. Iron deficiency anemia secondary to malabsorption with bariatric surgery (2012)  Current Therapy:   Observation IV iron as indicated   Interim History:  [name] is here today for follow-up. She is doing fairly well but still has persistent fatigue.  No fever, chills, n/v, cough, rash, dizziness, palpitations, abdominal pain or changes in bowel or bladder habits.  No swelling, numbness or tingling in her extremities.  She has generalized aches and musculoskeletal pains due to her connective tissue disease and arthritis.  She will have these musculoskeletal fcramps in the chest at times and occasional SOB. She states that she has scheduled an appointment with cardiology for further work-up.  No episodes of bleeding. She does bruises easily but not in excess.  No lymphadenopathy noted on her exam.  She has maintained a good appetite but admits that she needs to hydrate better at home. Her weight is stable.   ECOG Performance Status: 0 - Asymptomatic  Medications:  Allergies as of 10/26/2017      Reactions   Penicillins Other (See Comments)   Causes severe, difficult to treat yeast infections      Medication List        Accurate as of 10/26/17 10:13 AM. Always use your most recent med list.          Biotin 10 MG Caps biotin   celecoxib 200 MG capsule Commonly known as:  CELEBREX Take 200 mg by mouth daily.   cyclobenzaprine 10 MG tablet Commonly known as:  FLEXERIL Take 10 mg by mouth 3 (three) times daily as needed.   Glucosamine Sulfate 1000 MG Caps Take by mouth.   hydrochlorothiazide 25 MG tablet Commonly known as:  HYDRODIURIL TAKE 1 TABLET BY MOUTH EVERY DAY   ibuprofen 600 MG tablet Commonly known as:  ADVIL,MOTRIN 1  po  pc  every 6 hours x 5 days then as needed for pain     ipratropium 0.03 % nasal spray Commonly known as:  ATROVENT Place 1 spray into both nostrils 2 (two) times daily.   KLOR-CON M20 20 MEQ tablet Generic drug:  potassium chloride SA TAKE 1 TABLET BY MOUTH EVERY DAY   levothyroxine 88 MCG tablet Commonly known as:  SYNTHROID, LEVOTHROID Take 1 tablet (88 mcg total) by mouth daily.   levothyroxine 88 MCG tablet Commonly known as:  SYNTHROID, LEVOTHROID TAKE 1 TABLET BY MOUTH EVERY DAY   loratadine 10 MG tablet Commonly known as:  CLARITIN TAKE 1 TABLET BY MOUTH EVERY DAY   losartan 100 MG tablet Commonly known as:  COZAAR TAKE 1 TABLET BY MOUTH EVERY DAY   magnesium 30 MG tablet Take 30 mg by mouth 2 (two) times daily.   omeprazole 40 MG capsule Commonly known as:  PRILOSEC Take 1 capsule (40 mg total) by mouth daily.   VITAMIN B-12 PO Take 1 tablet by mouth daily.   Vitamin D (Ergocalciferol) 50000 units Caps capsule Commonly known as:  DRISDOL TAKE ONE CAPSULE BY MOUTH ONE TIME PER WEEK       Allergies:  Allergies  Allergen Reactions  . Penicillins Other (See Comments)    Causes severe, difficult to treat yeast infections    Past Medical History, Surgical history, Social history, and Family History were reviewed and updated.  Review of Systems: All other 10 point review of systems is  negative.   Physical Exam:  vitals were not taken for this visit.   Wt Readings from Last 3 Encounters:  09/14/17 239 lb 6.4 oz (108.6 kg)  04/27/17 235 lb 8 oz (106.8 kg)  03/16/17 237 lb (107.5 kg)    Ocular: Sclerae unicteric, pupils equal, round and reactive to light Ear-nose-throat: Oropharynx clear, dentition fair Lymphatic: No cervical, supraclavicular or axillary adenopathy Lungs no rales or rhonchi, good excursion bilaterally Heart regular rate and rhythm, no murmur appreciated Abd soft, nontender, positive bowel sounds, no liver or spleen tip palpated on exam, no fluid wave  MSK no focal spinal tenderness, no  joint edema Neuro: non-focal, well-oriented, appropriate affect Breasts: Deferred   Lab Results  Component Value Date   WBC 7.0 04/27/2017   HGB 13.8 04/27/2017   HCT 41.3 04/27/2017   MCV 90 04/27/2017   PLT 431 (H) 04/27/2017   Lab Results  Component Value Date   FERRITIN 62 04/27/2017   IRON 128 04/27/2017   TIBC 288 04/27/2017   UIBC 160 04/27/2017   IRONPCTSAT 44 04/27/2017   Lab Results  Component Value Date   RETICCTPCT 1.8 04/19/2014   RBC 4.59 04/27/2017   RETICCTABS 79.2 04/19/2014   No results found for: KPAFRELGTCHN, LAMBDASER, KAPLAMBRATIO No results found for: IGGSERUM, IGA, IGMSERUM No results found for: Odetta Pink, SPEI   Chemistry      Component Value Date/Time   NA 138 09/14/2017 1112   NA 138 04/27/2017 0933   K 3.8 09/14/2017 1112   K 3.3 (L) 04/27/2017 0933   CL 100 09/14/2017 1112   CO2 28 09/14/2017 1112   CO2 26 04/27/2017 0933   BUN 9 09/14/2017 1112   BUN 12.2 04/27/2017 0933   CREATININE 0.58 09/14/2017 1112   CREATININE 0.7 04/27/2017 0933      Component Value Date/Time   CALCIUM 9.3 09/14/2017 1112   CALCIUM 9.1 04/27/2017 0933   ALKPHOS 128 (H) 09/14/2017 1112   ALKPHOS 139 04/27/2017 0933   AST 23 09/14/2017 1112   AST 25 04/27/2017 0933   ALT 18 09/14/2017 1112   ALT 19 04/27/2017 0933   BILITOT 0.4 09/14/2017 1112   BILITOT 0.58 04/27/2017 0933      Impression and Plan: [name] is a very pleasant 52 yo African American female with history of mild thrombocytosis and iron deficiency secondary to malabsorption after having bariatric surgery in 2012.  Platelet count today is 506. No anemia, WBC count is 10.2.  She is still having persistent fatigue. We will see what her iron studies show and bring her back in for infusion if needed.  We will go ahead and plan to see her back in another 6 months for follow-up.  She will contact our office with any questions or concerns.  We can certainly see her sooner if needed.   Laverna Peace, NP 6/3/201910:13 AM

## 2017-10-28 ENCOUNTER — Encounter: Payer: Self-pay | Admitting: Family

## 2017-10-28 ENCOUNTER — Ambulatory Visit: Payer: PRIVATE HEALTH INSURANCE | Admitting: Podiatry

## 2017-11-02 ENCOUNTER — Ambulatory Visit: Payer: BLUE CROSS/BLUE SHIELD | Admitting: Podiatry

## 2017-11-02 ENCOUNTER — Other Ambulatory Visit: Payer: Self-pay | Admitting: Podiatry

## 2017-11-02 ENCOUNTER — Ambulatory Visit (INDEPENDENT_AMBULATORY_CARE_PROVIDER_SITE_OTHER): Payer: BLUE CROSS/BLUE SHIELD

## 2017-11-02 ENCOUNTER — Encounter: Payer: Self-pay | Admitting: Podiatry

## 2017-11-02 VITALS — BP 122/69 | HR 64

## 2017-11-02 DIAGNOSIS — M79672 Pain in left foot: Secondary | ICD-10-CM

## 2017-11-02 DIAGNOSIS — M779 Enthesopathy, unspecified: Secondary | ICD-10-CM

## 2017-11-02 DIAGNOSIS — M79671 Pain in right foot: Secondary | ICD-10-CM | POA: Diagnosis not present

## 2017-11-02 DIAGNOSIS — D169 Benign neoplasm of bone and articular cartilage, unspecified: Secondary | ICD-10-CM | POA: Diagnosis not present

## 2017-11-02 NOTE — Progress Notes (Signed)
Subjective:   Patient ID: [name], female   DOB: 52 y.o.   MRN: [id number]   HPI Patient presents stating she is getting a lot of burning on top of her left foot and it feels like the bone is irritated and its been this way for several years and worse when she wear shoe.  Also starting to get pain in her ankle and she is not sure if it is from walking differently and she is been on oral anti-inflammatories which has not helped this condition.  Patient does not smoke and likes to be active   Review of Systems  All other systems reviewed and are negative.       Objective:  Physical Exam  Constitutional: She appears well-developed and well-nourished.  Cardiovascular: Intact distal pulses.  Pulmonary/Chest: Effort normal.  Musculoskeletal: Normal range of motion.  Neurological: She is alert.  Skin: Skin is warm.  Nursing note and vitals reviewed.   Neurovascular status intact muscle strength adequate range of motion within normal limits with patient found to have prominence of the mid tarsal joint left in the Lisfranc joint with pain with palpation and what appears to be nerve irritation with shooting pains into the forefoot.  Moderate discomfort around the anterior tibial tendon around the ankle which appears to be compensatory with no indications of pathology for muscle strength loss.  Patient has good digital perfusion well oriented x3     Assessment:  Bony spur formation dorsum left foot with tarsal exostectomy with probable nerve compression and anterior tibial tendinitis left     Plan:  H&P conditions reviewed and at this point I recommended bone resection and I discussed this option versus steroid injection.  She is not interested in steroid injection wants a more definitive treatment and did explain that she probably has arthritis and there is no guarantee will she will not need eventual fusion but I would rather try to take the spur off and see if we can decompress the nerve  in this area.  Patient is willing to undergo this understanding risk and wants to do consent form today as she is already seen another doctor and at this point I let her go over the consent form discussing conservative surgical treatments available and reviewed at great length surgery.  She signed consent form after review understands total recovery takes approximately 6 months and I did go ahead today and I dispensed air fracture walker for the postoperative.  With education on how to use it and she will begin to get used to it prior to procedure and also to reduce the pressure on the anterior tibial tendon.  Scheduled for outpatient surgery and encouraged to call with any questions prior to procedure  X-ray indicates spur of the Lisfranc's joint left with prominence noted directly where she is having the pain

## 2017-11-02 NOTE — Patient Instructions (Signed)
Pre-Operative Instructions  Congratulations, you have decided to take an important step towards improving your quality of life.  You can be assured that the doctors and staff at [facility] will be with you every step of the way.  Here are some important things you should know:  1. Plan to be at the surgery center/hospital at least 1 (one) hour prior to your scheduled time, unless otherwise directed by the surgical center/hospital staff.  You must have a responsible adult accompany you, remain during the surgery and drive you home.  Make sure you have directions to the surgical center/hospital to ensure you arrive on time. 2. If you are having surgery at [facility] or [facility] hospitals, you will need a copy of your medical history and physical form from your family physician within one month prior to the date of surgery. We will give you a form for your primary physician to complete.  3. We make every effort to accommodate the date you request for surgery.  However, there are times where surgery dates or times have to be moved.  We will contact you as soon as possible if a change in schedule is required.   4. No aspirin/ibuprofen for one week before surgery.  If you are on aspirin, any non-steroidal anti-inflammatory medications (Mobic, Aleve, Ibuprofen) should not be taken seven (7) days prior to your surgery.  You make take Tylenol for pain prior to surgery.  5. Medications - If you are taking daily heart and blood pressure medications, seizure, reflux, allergy, asthma, anxiety, pain or diabetes medications, make sure you notify the surgery center/hospital before the day of surgery so they can tell you which medications you should take or avoid the day of surgery. 6. No food or drink after midnight the night before surgery unless directed otherwise by surgical center/hospital staff. 7. No alcoholic beverages 24-hours prior to surgery.  No smoking 24-hours prior or 24-hours after  surgery. 8. Wear loose pants or shorts. They should be loose enough to fit over bandages, boots, and casts. 9. Don't wear slip-on shoes. Sneakers are preferred. 10. Bring your boot with you to the surgery center/hospital.  Also bring crutches or a walker if your physician has prescribed it for you.  If you do not have this equipment, it will be provided for you after surgery. 11. If you have not been contacted by the surgery center/hospital by the day before your surgery, call to confirm the date and time of your surgery. 12. Leave-time from work may vary depending on the type of surgery you have.  Appropriate arrangements should be made prior to surgery with your employer. 13. Prescriptions will be provided immediately following surgery by your doctor.  Fill these as soon as possible after surgery and take the medication as directed. Pain medications will not be refilled on weekends and must be approved by the doctor. 14. Remove nail polish on the operative foot and avoid getting pedicures prior to surgery. 15. Wash the night before surgery.  The night before surgery wash the foot and leg well with water and the antibacterial soap provided. Be sure to pay special attention to beneath the toenails and in between the toes.  Wash for at least three (3) minutes. Rinse thoroughly with water and dry well with a towel.  Perform this wash unless told not to do so by your physician.  Enclosed: 1 Ice pack (please put in freezer the night before surgery)   1 Hibiclens skin cleaner     Pre-op instructions  If you have any questions regarding the instructions, please do not hesitate to call our office.  [place]: [address], [place], [place] [address] -- [phone]  [place]: [address] [place], [place] [address] -- [phone]  [place]: [address]  [place], [place] [address] -- [phone]  [place]: [address], [address], [place], [place] [address] -- [phone]  Website:  [email or web address] 

## 2017-11-13 ENCOUNTER — Encounter: Payer: Self-pay | Admitting: Physician Assistant

## 2017-11-13 ENCOUNTER — Ambulatory Visit: Payer: BLUE CROSS/BLUE SHIELD | Admitting: Physician Assistant

## 2017-11-13 VITALS — BP 126/80 | HR 53 | Ht 64.0 in | Wt 239.0 lb

## 2017-11-13 DIAGNOSIS — R079 Chest pain, unspecified: Secondary | ICD-10-CM | POA: Diagnosis not present

## 2017-11-13 DIAGNOSIS — I495 Sick sinus syndrome: Secondary | ICD-10-CM

## 2017-11-13 DIAGNOSIS — R001 Bradycardia, unspecified: Secondary | ICD-10-CM | POA: Diagnosis not present

## 2017-11-13 NOTE — Progress Notes (Signed)
Cardiology Office Note    Date:  11/13/2017   ID:  [name], DOB 12-14-1965, MRN [id number]  PCP:  Ann Held, DO  Cardiologist: Dr. Marlou Porch (seen in 2015)  Chief Complaint: chest pain   History of Present Illness:   [name] is a 52 y.o. female with history of COPD, mild thrombocytosis and iron deficiency secondary to malabsorption after having bariatric surgery in 2012 (followed by oncology - gets iron infusion as needed) hypertension, obesity, sleep apnea and IBS referred by Dr. Carollee Herter for evaluation of chest pain.   Remotely seen by Dr. Marlou Porch in November 2015  for chest pain.  Exercise treadmill test without evidence of ischemia.  Echocardiogram showed normal LV function of 55 to 60% without wall motion abnormality.  He today for evaluation of ongoing cramp-like chest discomfort dating back to last seen by Dr. Marlou Porch.  No exacerbation.  No associated shortness of breath, nausea, diaphoresis or radiation.  Her chest discomfort is not reproducible with exertion. She continues to have sensation of "running blood clot on her forearm ".  Recent upper extremity Doppler without DVT.  She has intermittent bradycardia with heart rates dropping to low 40s.  At that time, she feels severely fatigued and tired.  Stays  in low heart rate for few hours.  No syncope, orthopnea, PND, lower extremity edema or palpitation.  She is not on any rate control agent.  Past Medical History:  Diagnosis Date  . Allergic rhinitis, cause unspecified   . Anemia, iron deficiency   . COPD (chronic obstructive pulmonary disease) ([place])   . Diastolic dysfunction   . Diverticulosis of colon   . GERD (gastroesophageal reflux disease)   . Goiter   . Hiatal hernia   . Hypertension   . IBS (irritable bowel syndrome)   . Morbid obesity ([place])   . Obstructive sleep apnea   . Thrombocytosis ([place])   . Undiagnosed cardiac murmurs     Past Surgical History:  Procedure Laterality Date  .  BARIATRIC SURGERY  06/17/2010  . BILATERAL SALPINGECTOMY Bilateral 06/22/2014   Procedure: BILATERAL SALPINGECTOMY;  Surgeon: Crawford Givens, MD;  Location: [facility];  Service: Gynecology;  Laterality: Bilateral;  . CYSTOSCOPY N/A 06/22/2014   Procedure: CYSTOSCOPY;  Surgeon: Crawford Givens, MD;  Location: [facility];  Service: Gynecology;  Laterality: N/A;  . ENDOMETRIAL ABLATION  12/08  . LAPAROSCOPIC ASSISTED VAGINAL HYSTERECTOMY N/A 06/22/2014   Procedure: LAPAROSCOPIC ASSISTED VAGINAL HYSTERECTOMY;  Surgeon: Crawford Givens, MD;  Location: [facility];  Service: Gynecology;  Laterality: N/A;  . LIPOMA EXCISION     upper back  . SPINE SURGERY  02/10/2012   L4-l5 fusion--co[facility]    Current Medications: Prior to Admission medications   Medication Sig Start Date End Date Taking? Authorizing Provider  Biotin 10 MG CAPS biotin    [provider]  celecoxib (CELEBREX) 200 MG capsule Take 200 mg by mouth daily. 09/25/16   [provider]  Cyanocobalamin (VITAMIN B-12 PO) Take 1 tablet by mouth daily.    [provider]  cyclobenzaprine (FLEXERIL) 10 MG tablet Take 10 mg by mouth 3 (three) times daily as needed. 08/11/16   [provider]  Glucosamine Sulfate 1000 MG CAPS Take by mouth.    [provider]  hydrochlorothiazide (HYDRODIURIL) 25 MG tablet TAKE 1 TABLET BY MOUTH EVERY DAY 09/25/17   Carollee Herter, Alferd Apa, DO  ibuprofen (ADVIL,MOTRIN) 600 MG tablet 1  po  pc  every 6 hours x  5 days then as needed for pain 06/23/14   Earnstine Regal, PA-C  ipratropium (ATROVENT) 0.03 % nasal spray Place 1 spray into both nostrils 2 (two) times daily. 09/14/17   Carollee Herter, Yvonne R, DO  KLOR-CON M20 20 MEQ tablet TAKE 1 TABLET BY MOUTH EVERY DAY 09/14/17   Carollee Herter, Alferd Apa, DO  levothyroxine (SYNTHROID, LEVOTHROID) 88 MCG tablet Take 1 tablet (88 mcg total) by mouth daily. 09/26/16   Ann Held, DO  levothyroxine (SYNTHROID, LEVOTHROID) 88 MCG tablet TAKE 1 TABLET BY  MOUTH EVERY DAY 03/30/17   Carollee Herter, Alferd Apa, DO  loratadine (CLARITIN) 10 MG tablet TAKE 1 TABLET BY MOUTH EVERY DAY 06/02/17   Carollee Herter, Alferd Apa, DO  losartan (COZAAR) 100 MG tablet TAKE 1 TABLET BY MOUTH EVERY DAY 08/25/17   Roma Schanz R, DO  magnesium 30 MG tablet Take 30 mg by mouth 2 (two) times daily.    [provider]  omeprazole (PRILOSEC) 40 MG capsule Take 1 capsule (40 mg total) by mouth daily. 04/23/16   Ann Held, DO  Vitamin D, Ergocalciferol, (DRISDOL) 50000 units CAPS capsule TAKE ONE CAPSULE BY MOUTH ONE TIME PER WEEK 06/01/17   Ann Held, DO    Allergies:   Penicillins   Social History   Socioeconomic History  . Marital status: Married    Spouse name: Not on file  . Number of children: 3  . Years of education: Not on file  . Highest education level: Not on file  Occupational History  . Occupation: Sales promotion account executive: UNEMPLOYED  Social Needs  . Financial resource strain: Not on file  . Food insecurity:    Worry: Not on file    Inability: Not on file  . Transportation needs:    Medical: Not on file    Non-medical: Not on file  Tobacco Use  . Smoking status: Never Smoker  . Smokeless tobacco: Never Used  . Tobacco comment: Never Used Tobacco  Substance and Sexual Activity  . Alcohol use: No    Alcohol/week: 0.0 oz  . Drug use: No  . Sexual activity: Yes    Partners: Male  Lifestyle  . Physical activity:    Days per week: Not on file    Minutes per session: Not on file  . Stress: Not on file  Relationships  . Social connections:    Talks on phone: Not on file    Gets together: Not on file    Attends religious service: Not on file    Active member of club or organization: Not on file    Attends meetings of clubs or organizations: Not on file    Relationship status: Not on file  Other Topics Concern  . Not on file  Social History Narrative  . Not on file     Family History:  The patient's family  history includes Arrhythmia in her mother; Breast cancer in her mother; Cancer in her father; Cancer (age of onset: 50) in her maternal aunt; Cancer (age of onset: 50) in her mother; Colon polyps in her father; Diabetes Mellitus II in her mother; Heart disease in her mother; Hypertension in her father; Kidney cancer in her father; Lymphoma in her father.  ROS:   Please see the history of present illness.    ROS All other systems reviewed and are negative.   PHYSICAL EXAM:   VS:  BP 126/80   Pulse (!) 53  Ht 5\' 4"  (1.626 m)   Wt 239 lb (108.4 kg)   LMP 01/20/2011   SpO2 97%   BMI 41.02 kg/m    GEN: Well nourished, well developed, in no acute distress  HEENT: normal  Neck: no JVD, carotid bruits, or masses Cardiac: RRR; no murmurs, rubs, or gallops,no edema  Respiratory:  clear to auscultation bilaterally, normal work of breathing GI: soft, nontender, nondistended, + BS MS: no deformity or atrophy  Skin: warm and dry, no rash Neuro:  Alert and Oriented x 3, Strength and sensation are intact Psych: euthymic mood, full affect  Wt Readings from Last 3 Encounters:  11/13/17 239 lb (108.4 kg)  10/26/17 239 lb (108.4 kg)  09/14/17 239 lb 6.4 oz (108.6 kg)      Studies/Labs Reviewed:   EKG:  EKG is ordered today.  The ekg ordered today demonstrates sinus bradycardia at rate of 53 bpm  Recent Labs: 09/14/2017: TSH 1.14 10/26/2017: ALT 13; BUN 15; Creatinine 0.75; Hemoglobin 12.9; Platelet Count 506; Potassium 3.8; Sodium 137   Lipid Panel    Component Value Date/Time   CHOL 174 09/14/2017 1112   TRIG 60.0 09/14/2017 1112   HDL 61.00 09/14/2017 1112   CHOLHDL 3 09/14/2017 1112   VLDL 12.0 09/14/2017 1112   LDLCALC 101 (H) 09/14/2017 1112    Additional studies/ records that were reviewed today include:   As summarized above  ASSESSMENT & PLAN:    1. Chest discomfort Her symptoms is very atypical for cardiac etiology.  This been ongoing since last evaluation in 2015.   Reassuring echocardiogram and stress test.  EKG is similar to prior in 2015.  Reviewed with Dr. Marlou Porch today.  No further ischemic evaluation needed.  Could be due to atypical GERD given history of bariatric surgery.  2.  Transient bradycardia -She is symptomatic with this.  Not on any rate control agent.  Denies syncope.  Get 30-day event monitor to rule out any arrhythmia or significant drop in heart rate.  3.  Thrombocytosis -Followed by oncology.  She also has sensation of "floating blood clot on her forearm bilaterally".  Recent Doppler did not showed blood clot.  Encouraged discussion with oncology if need of long term anticoagulation/antipletelet therapy.    Medication Adjustments/Labs and Tests Ordered: Current medicines are reviewed at length with the patient today.  Concerns regarding medicines are outlined above.  Medication changes, Labs and Tests ordered today are listed in the Patient Instructions below. Patient Instructions  Medication Instructions:  Your physician recommends that you continue on your current medications as directed. Please refer to the Current Medication list given to you today.  Labwork: None ordered  Testing/Procedures: Your physician has recommended that you wear an event monitor. Event monitors are medical devices that record the heart's electrical activity. Doctors most often [place] these monitors to diagnose arrhythmias. Arrhythmias are problems with the speed or rhythm of the heartbeat. The monitor is a small, portable device. You can wear one while you do your normal daily activities. This is usually used to diagnose what is causing palpitations/syncope (passing out).   Follow-Up: Your physician recommends that you schedule a follow-up appointment in: AS NEEDED   Any Other Special Instructions Will Be Listed Below (If Applicable).  Cardiac Event Monitoring A cardiac event monitor is a small recording device that is used to detect abnormal heart  rhythms (arrhythmias). The monitor is used to record your heart rhythm when you have symptoms, such as:  Fast heartbeats (palpitations), such  as heart racing or fluttering.  Dizziness.  Fainting or light-headedness.  Unexplained weakness.  Some monitors are wired to electrodes placed on your chest. Electrodes are flat, sticky disks that attach to your skin. Other monitors may be hand-held or worn on the wrist. The monitor can be worn for up to 30 days. If the monitor is attached to your chest, a technician will prepare your chest for the electrode placement and show you how to work the monitor. Take time to practice using the monitor before you leave the office. Make sure you understand how to send the information from the monitor to your health care provider. In some cases, you may need to use a landline telephone instead of a cell phone. What are the risks? Generally, this device is safe to use, but it possible that the skin under the electrodes will become irritated. How to use your cardiac event monitor  Wear your monitor at all times, except when you are in water: ? Do not let the monitor get wet. ? Take the monitor off when you bathe. Do not swim or use a hot tub with it on.  Keep your skin clean. Do not put body lotion or moisturizer on your chest.  Change the electrodes as told by your health care provider or any time they stop sticking to your skin. You may need to use medical tape to keep them on.  Try to put the electrodes in slightly different places on your chest to help prevent skin irritation. They must remain in the area under your left breast and in the upper right section of your chest.  Make sure the monitor is safely clipped to your clothing or in a location close to your body that your health care provider recommends.  Press the button to record as soon as you feel heart-related symptoms, such  as: ? Dizziness. ? Weakness. ? Light-headedness. ? Palpitations. ? Thumping or pounding in your chest. ? Shortness of breath. ? Unexplained weakness.  Keep a diary of your activities, such as walking, doing chores, and taking medicine. It is very important to note what you were doing when you pushed the button to record your symptoms. This will help your health care provider determine what might be contributing to your symptoms.  Send the recorded information as recommended by your health care provider. It may take some time for your health care provider to process the results.  Change the batteries as told by your health care provider.  Keep electronic devices away from your monitor. This includes: ? Tablets. ? MP3 players. ? Cell phones.  While wearing your monitor you should avoid: ? Electric blankets. ? Armed forces operational officer. ? Electric toothbrushes. ? Microwave ovens. ? Magnets. ? Metal detectors. Get help right away if:  You have chest pain.  You have extreme difficulty breathing or shortness of breath.  You develop a very fast heartbeat that persists.  You develop dizziness that does not go away.  You faint or constantly feel like you are about to faint. Summary  A cardiac event monitor is a small recording device that is used to help detect abnormal heart rhythms (arrhythmias).  The monitor is used to record your heart rhythm when you have heart-related symptoms.  Make sure you understand how to send the information from the monitor to your health care provider.  It is important to press the button on the monitor when you have any heart-related symptoms.  Keep a diary of your activities, such  as walking, doing chores, and taking medicine. It is very important to note what you were doing when you pushed the button to record your symptoms. This will help your health care provider learn what might be causing your symptoms. This information is not intended to replace  advice given to you by your health care provider. Make sure you discuss any questions you have with your health care provider. Document Released: 02/19/2008 Document Revised: 04/26/2016 Document Reviewed: 04/26/2016 Elsevier Interactive Patient Education  2017 [employer].   If you need a refill on your cardiac medications before your next appointment, please call your pharmacy.      Jarrett Soho, [place]  11/13/2017 10:07 AM    [facility] [address], [place], [place]  [address] Phone: [phone]; Fax: [phone]

## 2017-11-13 NOTE — Patient Instructions (Addendum)
Medication Instructions:  Your physician recommends that you continue on your current medications as directed. Please refer to the Current Medication list given to you today.  Labwork: None ordered  Testing/Procedures: Your physician has recommended that you wear an event monitor. Event monitors are medical devices that record the heart's electrical activity. Doctors most often [place] these monitors to diagnose arrhythmias. Arrhythmias are problems with the speed or rhythm of the heartbeat. The monitor is a small, portable device. You can wear one while you do your normal daily activities. This is usually used to diagnose what is causing palpitations/syncope (passing out).   Follow-Up: Your physician recommends that you schedule a follow-up appointment in: AS NEEDED   Any Other Special Instructions Will Be Listed Below (If Applicable).  Cardiac Event Monitoring A cardiac event monitor is a small recording device that is used to detect abnormal heart rhythms (arrhythmias). The monitor is used to record your heart rhythm when you have symptoms, such as:  Fast heartbeats (palpitations), such as heart racing or fluttering.  Dizziness.  Fainting or light-headedness.  Unexplained weakness.  Some monitors are wired to electrodes placed on your chest. Electrodes are flat, sticky disks that attach to your skin. Other monitors may be hand-held or worn on the wrist. The monitor can be worn for up to 30 days. If the monitor is attached to your chest, a technician will prepare your chest for the electrode placement and show you how to work the monitor. Take time to practice using the monitor before you leave the office. Make sure you understand how to send the information from the monitor to your health care provider. In some cases, you may need to use a landline telephone instead of a cell phone. What are the risks? Generally, this device is safe to use, but it possible that the skin under the  electrodes will become irritated. How to use your cardiac event monitor  Wear your monitor at all times, except when you are in water: ? Do not let the monitor get wet. ? Take the monitor off when you bathe. Do not swim or use a hot tub with it on.  Keep your skin clean. Do not put body lotion or moisturizer on your chest.  Change the electrodes as told by your health care provider or any time they stop sticking to your skin. You may need to use medical tape to keep them on.  Try to put the electrodes in slightly different places on your chest to help prevent skin irritation. They must remain in the area under your left breast and in the upper right section of your chest.  Make sure the monitor is safely clipped to your clothing or in a location close to your body that your health care provider recommends.  Press the button to record as soon as you feel heart-related symptoms, such as: ? Dizziness. ? Weakness. ? Light-headedness. ? Palpitations. ? Thumping or pounding in your chest. ? Shortness of breath. ? Unexplained weakness.  Keep a diary of your activities, such as walking, doing chores, and taking medicine. It is very important to note what you were doing when you pushed the button to record your symptoms. This will help your health care provider determine what might be contributing to your symptoms.  Send the recorded information as recommended by your health care provider. It may take some time for your health care provider to process the results.  Change the batteries as told by your health care provider.  Keep electronic devices away from your monitor. This includes: ? Tablets. ? MP3 players. ? Cell phones.  While wearing your monitor you should avoid: ? Electric blankets. ? Armed forces operational officer. ? Electric toothbrushes. ? Microwave ovens. ? Magnets. ? Metal detectors. Get help right away if:  You have chest pain.  You have extreme difficulty breathing or shortness  of breath.  You develop a very fast heartbeat that persists.  You develop dizziness that does not go away.  You faint or constantly feel like you are about to faint. Summary  A cardiac event monitor is a small recording device that is used to help detect abnormal heart rhythms (arrhythmias).  The monitor is used to record your heart rhythm when you have heart-related symptoms.  Make sure you understand how to send the information from the monitor to your health care provider.  It is important to press the button on the monitor when you have any heart-related symptoms.  Keep a diary of your activities, such as walking, doing chores, and taking medicine. It is very important to note what you were doing when you pushed the button to record your symptoms. This will help your health care provider learn what might be causing your symptoms. This information is not intended to replace advice given to you by your health care provider. Make sure you discuss any questions you have with your health care provider. Document Released: 02/19/2008 Document Revised: 04/26/2016 Document Reviewed: 04/26/2016 Elsevier Interactive Patient Education  2017 [employer].   If you need a refill on your cardiac medications before your next appointment, please call your pharmacy.

## 2017-11-15 ENCOUNTER — Other Ambulatory Visit: Payer: Self-pay | Admitting: Family Medicine

## 2017-11-24 DIAGNOSIS — M25775 Osteophyte, left foot: Secondary | ICD-10-CM | POA: Diagnosis not present

## 2017-11-24 DIAGNOSIS — M722 Plantar fascial fibromatosis: Secondary | ICD-10-CM | POA: Diagnosis not present

## 2017-11-24 HISTORY — PX: FOOT SURGERY: SHX648

## 2017-12-02 ENCOUNTER — Ambulatory Visit (INDEPENDENT_AMBULATORY_CARE_PROVIDER_SITE_OTHER): Payer: BLUE CROSS/BLUE SHIELD

## 2017-12-02 ENCOUNTER — Other Ambulatory Visit: Payer: Self-pay | Admitting: Family Medicine

## 2017-12-02 ENCOUNTER — Ambulatory Visit (INDEPENDENT_AMBULATORY_CARE_PROVIDER_SITE_OTHER): Payer: BLUE CROSS/BLUE SHIELD | Admitting: Podiatry

## 2017-12-02 ENCOUNTER — Encounter: Payer: Self-pay | Admitting: Podiatry

## 2017-12-02 VITALS — BP 125/84 | HR 55 | Temp 96.1°F

## 2017-12-02 DIAGNOSIS — N920 Excessive and frequent menstruation with regular cycle: Secondary | ICD-10-CM | POA: Insufficient documentation

## 2017-12-02 DIAGNOSIS — R32 Unspecified urinary incontinence: Secondary | ICD-10-CM | POA: Insufficient documentation

## 2017-12-02 DIAGNOSIS — R58 Hemorrhage, not elsewhere classified: Secondary | ICD-10-CM | POA: Insufficient documentation

## 2017-12-02 DIAGNOSIS — N841 Polyp of cervix uteri: Secondary | ICD-10-CM | POA: Insufficient documentation

## 2017-12-02 DIAGNOSIS — D169 Benign neoplasm of bone and articular cartilage, unspecified: Secondary | ICD-10-CM

## 2017-12-02 DIAGNOSIS — N390 Urinary tract infection, site not specified: Secondary | ICD-10-CM | POA: Insufficient documentation

## 2017-12-02 NOTE — Progress Notes (Signed)
Subjective:   Patient ID: [name], female   DOB: 52 y.o.   MRN: [id number]   HPI Patient presents stating her foot is doing very well with minimal discomfort   ROS      Objective:  Physical Exam  Neurovascular status intact negative Homans sign was noted with patient's left foot healing well with wound edges well coapted and mild discomfort with palpation     Assessment:  Doing well post tarsal exostectomy left with wound edges well coapted     Plan:  X-rays reviewed and patient had sterile dressing reapplied continue elevation compression immobilization and reappoint in the next 2 to 4 weeks or earlier if needed  X-ray indicates satisfactory resection of bone dorsal right foot

## 2017-12-28 ENCOUNTER — Other Ambulatory Visit: Payer: Self-pay | Admitting: Physician Assistant

## 2017-12-28 ENCOUNTER — Ambulatory Visit (INDEPENDENT_AMBULATORY_CARE_PROVIDER_SITE_OTHER): Payer: BLUE CROSS/BLUE SHIELD

## 2017-12-28 DIAGNOSIS — R5383 Other fatigue: Secondary | ICD-10-CM

## 2017-12-28 DIAGNOSIS — R001 Bradycardia, unspecified: Secondary | ICD-10-CM

## 2017-12-30 ENCOUNTER — Ambulatory Visit (INDEPENDENT_AMBULATORY_CARE_PROVIDER_SITE_OTHER): Payer: BLUE CROSS/BLUE SHIELD | Admitting: Podiatry

## 2017-12-30 ENCOUNTER — Ambulatory Visit (INDEPENDENT_AMBULATORY_CARE_PROVIDER_SITE_OTHER): Payer: BLUE CROSS/BLUE SHIELD

## 2017-12-30 DIAGNOSIS — D169 Benign neoplasm of bone and articular cartilage, unspecified: Secondary | ICD-10-CM

## 2017-12-30 NOTE — Progress Notes (Signed)
Subjective:   Patient ID: [name], female   DOB: 52 y.o.   MRN: [id number]   HPI Patient presents overall doing well with some numbness into my second toe at times   ROS      Objective:  Physical Exam  Neurovascular status intact with well-healed surgical site dorsal left foot with wound edges well coapted mild swelling noted     Assessment:  Overall doing well from tarsal exostectomy left with probable compression of the nerve creating mild symptoms at times of the distal digits     Plan:  Advised on continued compression and gradual wearing of closed in shoes and dispensed ankle compression stocking.  Patient's discharge will be seen back as needed

## 2017-12-31 ENCOUNTER — Other Ambulatory Visit: Payer: Self-pay | Admitting: Family Medicine

## 2018-02-15 ENCOUNTER — Other Ambulatory Visit: Payer: Self-pay | Admitting: Family Medicine

## 2018-02-15 DIAGNOSIS — I10 Essential (primary) hypertension: Secondary | ICD-10-CM

## 2018-02-19 ENCOUNTER — Encounter: Payer: Self-pay | Admitting: Podiatry

## 2018-02-19 NOTE — Progress Notes (Signed)
DOS 11/24/2017 Tarsal Exostectomy LT

## 2018-03-08 ENCOUNTER — Other Ambulatory Visit: Payer: Self-pay | Admitting: Family Medicine

## 2018-03-08 DIAGNOSIS — Z1231 Encounter for screening mammogram for malignant neoplasm of breast: Secondary | ICD-10-CM

## 2018-03-17 ENCOUNTER — Ambulatory Visit
Admission: RE | Admit: 2018-03-17 | Discharge: 2018-03-17 | Disposition: A | Discharge status: Home / Self Care | Payer: BLUE CROSS/BLUE SHIELD | Source: Ambulatory Visit | Attending: Family Medicine | Admitting: Family Medicine

## 2018-03-17 DIAGNOSIS — Z1231 Encounter for screening mammogram for malignant neoplasm of breast: Secondary | ICD-10-CM

## 2018-03-21 ENCOUNTER — Other Ambulatory Visit: Payer: Self-pay | Admitting: Family Medicine

## 2018-03-21 DIAGNOSIS — Z9109 Other allergy status, other than to drugs and biological substances: Secondary | ICD-10-CM

## 2018-03-23 ENCOUNTER — Ambulatory Visit (INDEPENDENT_AMBULATORY_CARE_PROVIDER_SITE_OTHER): Payer: BLUE CROSS/BLUE SHIELD | Admitting: Family Medicine

## 2018-03-23 ENCOUNTER — Encounter: Payer: Self-pay | Admitting: Family Medicine

## 2018-03-23 VITALS — BP 141/67 | HR 50 | Temp 98.0°F | Resp 16 | Ht 64.0 in | Wt 242.0 lb

## 2018-03-23 DIAGNOSIS — Z0000 Encounter for general adult medical examination without abnormal findings: Secondary | ICD-10-CM | POA: Diagnosis not present

## 2018-03-23 DIAGNOSIS — E039 Hypothyroidism, unspecified: Secondary | ICD-10-CM | POA: Diagnosis not present

## 2018-03-23 DIAGNOSIS — D508 Other iron deficiency anemias: Secondary | ICD-10-CM | POA: Diagnosis not present

## 2018-03-23 DIAGNOSIS — I10 Essential (primary) hypertension: Secondary | ICD-10-CM

## 2018-03-23 LAB — CBC WITH DIFFERENTIAL/PLATELET
BASOS ABS: 0 10*3/uL (ref 0.0–0.1)
Basophils Relative: 0.4 % (ref 0.0–3.0)
Eosinophils Absolute: 0.1 10*3/uL (ref 0.0–0.7)
Eosinophils Relative: 1 % (ref 0.0–5.0)
HEMATOCRIT: 38.9 % (ref 36.0–46.0)
HEMOGLOBIN: 13.1 g/dL (ref 12.0–15.0)
Lymphocytes Relative: 42.9 % (ref 12.0–46.0)
Lymphs Abs: 3 10*3/uL (ref 0.7–4.0)
MCHC: 33.7 g/dL (ref 30.0–36.0)
MCV: 87.7 fl (ref 78.0–100.0)
Monocytes Absolute: 0.5 10*3/uL (ref 0.1–1.0)
Monocytes Relative: 7.2 % (ref 3.0–12.0)
Neutro Abs: 3.4 10*3/uL (ref 1.4–7.7)
Neutrophils Relative %: 48.5 % (ref 43.0–77.0)
PLATELETS: 478 10*3/uL — AB (ref 150.0–400.0)
RBC: 4.43 Mil/uL (ref 3.87–5.11)
RDW: 13.8 % (ref 11.5–15.5)
WBC: 7 10*3/uL (ref 4.0–10.5)

## 2018-03-23 LAB — LIPID PANEL
CHOL/HDL RATIO: 3
Cholesterol: 188 mg/dL (ref 0–200)
HDL: 55.9 mg/dL (ref >39.00)
LDL CALC: 118 mg/dL — AB (ref 0–99)
NONHDL: 132.45
Triglycerides: 70 mg/dL (ref 0.0–149.0)
VLDL: 14 mg/dL (ref 0.0–40.0)

## 2018-03-23 LAB — COMPREHENSIVE METABOLIC PANEL
ALT: 11 U/L (ref 0–35)
AST: 17 U/L (ref 0–37)
Albumin: 4.2 g/dL (ref 3.5–5.2)
Alkaline Phosphatase: 123 U/L — ABNORMAL HIGH (ref 39–117)
BUN: 14 mg/dL (ref 6–23)
CALCIUM: 9.2 mg/dL (ref 8.4–10.5)
CHLORIDE: 100 meq/L (ref 96–112)
CO2: 30 meq/L (ref 19–32)
Creatinine, Ser: 0.65 mg/dL (ref 0.40–1.20)
GFR: 122.68 mL/min (ref >60.00)
Glucose, Bld: 85 mg/dL (ref 70–99)
Potassium: 4.1 mEq/L (ref 3.5–5.1)
Sodium: 139 mEq/L (ref 135–145)
Total Bilirubin: 0.4 mg/dL (ref 0.2–1.2)
Total Protein: 6.8 g/dL (ref 6.0–8.3)

## 2018-03-23 LAB — FERRITIN: FERRITIN: 37.5 ng/mL (ref 10.0–291.0)

## 2018-03-23 LAB — IBC PANEL
IRON: 73 ug/dL (ref 42–145)
SATURATION RATIOS: 21.8 % (ref 20.0–50.0)
TRANSFERRIN: 239 mg/dL (ref 212.0–360.0)

## 2018-03-23 LAB — VITAMIN B12: VITAMIN B 12: 430 pg/mL (ref 211–911)

## 2018-03-23 LAB — TSH: TSH: 2.05 u[IU]/mL (ref 0.35–4.50)

## 2018-03-23 NOTE — Assessment & Plan Note (Signed)
Well controlled, no changes to meds. Encouraged heart healthy diet such as the DASH diet and exercise as tolerated.  °

## 2018-03-23 NOTE — Progress Notes (Signed)
Subjective:     [name] is a 52 y.o. female and is here for a comprehensive physical exam. The patient reports no problems.  ---  Pt also here to f/u thyroid , bp and labs  Social History   Socioeconomic History  . Marital status: Married    Spouse name: Not on file  . Number of children: 3  . Years of education: Not on file  . Highest education level: Not on file  Occupational History  . Occupation: Sales promotion account executive: UNEMPLOYED  Social Needs  . Financial resource strain: Not on file  . Food insecurity:    Worry: Not on file    Inability: Not on file  . Transportation needs:    Medical: Not on file    Non-medical: Not on file  Tobacco Use  . Smoking status: Never Smoker  . Smokeless tobacco: Never Used  . Tobacco comment: Never Used Tobacco  Substance and Sexual Activity  . Alcohol use: No    Alcohol/week: 0.0 standard drinks  . Drug use: No  . Sexual activity: Yes    Partners: Male  Lifestyle  . Physical activity:    Days per week: Not on file    Minutes per session: Not on file  . Stress: Not on file  Relationships  . Social connections:    Talks on phone: Not on file    Gets together: Not on file    Attends religious service: Not on file    Active member of club or organization: Not on file    Attends meetings of clubs or organizations: Not on file    Relationship status: Not on file  . Intimate partner violence:    Fear of current or ex partner: Not on file    Emotionally abused: Not on file    Physically abused: Not on file    Forced sexual activity: Not on file  Other Topics Concern  . Not on file  Social History Narrative  . Not on file   Health Maintenance  Topic Date Due  . INFLUENZA VACCINE  03/28/2019 (Originally 12/24/2017)  . TETANUS/TDAP  02/08/2019  . MAMMOGRAM  03/18/2019  . COLONOSCOPY  10/10/2027  . HIV Screening  Completed    The following portions of the patient's history were reviewed and updated as appropriate: She   has a past medical history of Allergic rhinitis, cause unspecified, Anemia, iron deficiency, COPD (chronic obstructive pulmonary disease) ([place]), Diastolic dysfunction, Diverticulosis of colon, GERD (gastroesophageal reflux disease), Goiter, Hiatal hernia, Hypertension, IBS (irritable bowel syndrome), Morbid obesity ([place]), Obstructive sleep apnea, Thrombocytosis ([place]), and Undiagnosed cardiac murmurs. She does not have any pertinent problems on file. She  has a past surgical history that includes Lipoma excision; Endometrial ablation (12/08); Bariatric Surgery (06/17/2010); Spine surgery (02/10/2012); Laparoscopic assisted vaginal hysterectomy (N/A, 06/22/2014); Bilateral salpingectomy (Bilateral, 06/22/2014); and Cystoscopy (N/A, 06/22/2014). Her family history includes Arrhythmia in her mother; Breast cancer in her mother; Cancer in her father; Cancer (age of onset: 56) in her maternal aunt; Cancer (age of onset: 36) in her mother; Colon polyps in her father; Diabetes Mellitus II in her mother; Heart disease in her mother; Hypertension in her father; Kidney cancer in her father; Lymphoma in her father. She  reports that she has never smoked. She has never used smokeless tobacco. She reports that she does not drink alcohol or use drugs. She has a current medication list which includes the following prescription(s): biotin, celecoxib, cyanocobalamin, glucosamine sulfate, hydrochlorothiazide,  ibuprofen, ipratropium, klor-con m20, levothyroxine, loratadine, losartan, magnesium, omeprazole, turmeric, and vitamin d (ergocalciferol). Current Outpatient Medications on File Prior to Visit  Medication Sig Dispense Refill  . Biotin 10 MG CAPS biotin    . celecoxib (CELEBREX) 200 MG capsule Take 200 mg by mouth daily.  1  . Cyanocobalamin (VITAMIN B-12 PO) Take 1 tablet by mouth daily.    . Glucosamine Sulfate 1000 MG CAPS Take by mouth.    . hydrochlorothiazide (HYDRODIURIL) 25 MG tablet TAKE 1 TABLET BY MOUTH EVERY  DAY 90 tablet 0  . ibuprofen (ADVIL,MOTRIN) 600 MG tablet 1  po  pc  every 6 hours x 5 days then as needed for pain 30 tablet 1  . ipratropium (ATROVENT) 0.03 % nasal spray Place 1 spray into both nostrils 2 (two) times daily. 30 mL 5  . KLOR-CON M20 20 MEQ tablet TAKE 1 TABLET BY MOUTH EVERY DAY 90 tablet 0  . levothyroxine (SYNTHROID, LEVOTHROID) 88 MCG tablet TAKE 1 TABLET BY MOUTH EVERY DAY 30 tablet 0  . loratadine (CLARITIN) 10 MG tablet TAKE 1 TABLET BY MOUTH EVERY DAY 90 tablet 2  . losartan (COZAAR) 100 MG tablet Take 1 tablet (100 mg total) by mouth daily. 90 tablet 1  . magnesium 30 MG tablet Take 30 mg by mouth 2 (two) times daily.    Marland Kitchen omeprazole (PRILOSEC) 40 MG capsule Take 1 capsule (40 mg total) by mouth daily. 90 capsule 1  . Turmeric 500 MG CAPS Take 500 mg by mouth daily. Take 500 mg by mouth daily.    . Vitamin D, Ergocalciferol, (DRISDOL) 50000 units CAPS capsule TAKE ONE CAPSULE BY MOUTH ONE TIME PER WEEK 12 capsule 1   No current facility-administered medications on file prior to visit.    She is allergic to penicillins..  Review of Systems Review of Systems  Constitutional: Negative for activity change, appetite change and fatigue.  HENT: Negative for hearing loss, congestion, tinnitus and ear discharge.  dentist q48m Eyes: Negative for visual disturbance (see optho q1y -- vision corrected to 20/20 with glasses).  Respiratory: Negative for cough, chest tightness and shortness of breath.   Cardiovascular: Negative for chest pain, palpitations and leg swelling.  Gastrointestinal: Negative for abdominal pain, diarrhea, constipation and abdominal distention.  Genitourinary: Negative for urgency, frequency, decreased urine volume and difficulty urinating.  Musculoskeletal: + joint/ muscle pains  Skin: Negative for color change, pallor and rash.  Neurological: Negative for dizziness, light-headedness, numbness and headaches.  Hematological: Negative for adenopathy. Does  not bruise/bleed easily.  Psychiatric/Behavioral: Negative for suicidal ideas, confusion, sleep disturbance, self-injury, dysphoric mood, decreased concentration and agitation.       Objective:    BP (!) 141/67 (BP Location: Right Arm, Patient Position: Sitting, Cuff Size: Large)   Pulse (!) 50   Temp 98 F (36.7 C) (Oral)   Resp 16   Ht 5\' 4"  (1.626 m)   Wt 242 lb (109.8 kg)   LMP 01/20/2011   SpO2 100%   BMI 41.54 kg/m  General appearance: alert, cooperative, appears stated age and no distress Head: Normocephalic, without obvious abnormality, atraumatic Eyes: conjunctivae/corneas clear. PERRL, EOM's intact. Fundi benign. Ears: normal TM's and external ear canals both ears Nose: Nares normal. Septum midline. Mucosa normal. No drainage or sinus tenderness. Throat: lips, mucosa, and tongue normal; teeth and gums normal Neck: no adenopathy, no carotid bruit, no JVD, supple, symmetrical, trachea midline and thyroid not enlarged, symmetric, no tenderness/mass/nodules Back: symmetric, no curvature. ROM normal.  No CVA tenderness. Lungs: clear to auscultation bilaterally Breasts: gyn Heart: regular rate and rhythm, S1, S2 normal, no murmur, click, rub or gallop Abdomen: soft, non-tender; bowel sounds normal; no masses,  no organomegaly Pelvic: deferred Extremities: extremities normal, atraumatic, no cyanosis or edema Pulses: 2+ and symmetric Skin: Skin color, texture, turgor normal. No rashes or lesions Lymph nodes: Cervical, supraclavicular, and axillary nodes normal. Neurologic: Alert and oriented X 3, normal strength and tone. Normal symmetric reflexes. Normal coordination and gait    Assessment:    Healthy female exam.      Plan:     See After Visit Summary for Counseling Recommendations   ghm utd Check labs  1. Preventative health care See above - CBC with Differential/Platelet - Lipid panel - TSH - Comprehensive metabolic panel - Vitamin Q49 - Vitamin D 1,25  dihydroxy - IBC panel - Ferritin  2. Other iron deficiency anemias Check labs  - CBC with Differential/Platelet - IBC panel - Ferritin  3. Hypothyroidism, unspecified type Check labs  - TSH  4. Essential hypertension Well controlled, no changes to meds. Encouraged heart healthy diet such as the DASH diet and exercise as tolerated.  - CBC with Differential/Platelet - Lipid panel - Comprehensive metabolic panel

## 2018-03-23 NOTE — Assessment & Plan Note (Signed)
Check labs con't synthroid 

## 2018-03-23 NOTE — Assessment & Plan Note (Signed)
Check labs  On no vitamins

## 2018-03-23 NOTE — Patient Instructions (Signed)
Preventive Care 40-64 Years, Female Preventive care refers to lifestyle choices and visits with your health care provider that can promote health and wellness. What does preventive care include?  A yearly physical exam. This is also called an annual well check.  Dental exams once or twice a year.  Routine eye exams. Ask your health care provider how often you should have your eyes checked.  Personal lifestyle choices, including: ? Daily care of your teeth and gums. ? Regular physical activity. ? Eating a healthy diet. ? Avoiding tobacco and drug use. ? Limiting alcohol use. ? Practicing safe sex. ? Taking low-dose aspirin daily starting at age 58. ? Taking vitamin and mineral supplements as recommended by your health care provider. What happens during an annual well check? The services and screenings done by your health care provider during your annual well check will depend on your age, overall health, lifestyle risk factors, and family history of disease. Counseling Your health care provider may ask you questions about your:  Alcohol use.  Tobacco use.  Drug use.  Emotional well-being.  Home and relationship well-being.  Sexual activity.  Eating habits.  Work and work Statistician.  Method of birth control.  Menstrual cycle.  Pregnancy history.  Screening You may have the following tests or measurements:  Height, weight, and BMI.  Blood pressure.  Lipid and cholesterol levels. These may be checked every 5 years, or more frequently if you are over 81 years old.  Skin check.  Lung cancer screening. You may have this screening every year starting at age 78 if you have a 30-pack-year history of smoking and currently smoke or have quit within the past 15 years.  Fecal occult blood test (FOBT) of the stool. You may have this test every year starting at age 65.  Flexible sigmoidoscopy or colonoscopy. You may have a sigmoidoscopy every 5 years or a colonoscopy  every 10 years starting at age 30.  Hepatitis C blood test.  Hepatitis B blood test.  Sexually transmitted disease (STD) testing.  Diabetes screening. This is done by checking your blood sugar (glucose) after you have not eaten for a while (fasting). You may have this done every 1-3 years.  Mammogram. This may be done every 1-2 years. Talk to your health care provider about when you should start having regular mammograms. This may depend on whether you have a family history of breast cancer.  BRCA-related cancer screening. This may be done if you have a family history of breast, ovarian, tubal, or peritoneal cancers.  Pelvic exam and Pap test. This may be done every 3 years starting at age 80. Starting at age 36, this may be done every 5 years if you have a Pap test in combination with an HPV test.  Bone density scan. This is done to screen for osteoporosis. You may have this scan if you are at high risk for osteoporosis.  Discuss your test results, treatment options, and if necessary, the need for more tests with your health care provider. Vaccines Your health care provider may recommend certain vaccines, such as:  Influenza vaccine. This is recommended every year.  Tetanus, diphtheria, and acellular pertussis (Tdap, Td) vaccine. You may need a Td booster every 10 years.  Varicella vaccine. You may need this if you have not been vaccinated.  Zoster vaccine. You may need this after age 5.  Measles, mumps, and rubella (MMR) vaccine. You may need at least one dose of MMR if you were born in  1957 or later. You may also need a second dose.  Pneumococcal 13-valent conjugate (PCV13) vaccine. You may need this if you have certain conditions and were not previously vaccinated.  Pneumococcal polysaccharide (PPSV23) vaccine. You may need one or two doses if you smoke cigarettes or if you have certain conditions.  Meningococcal vaccine. You may need this if you have certain  conditions.  Hepatitis A vaccine. You may need this if you have certain conditions or if you travel or work in places where you may be exposed to hepatitis A.  Hepatitis B vaccine. You may need this if you have certain conditions or if you travel or work in places where you may be exposed to hepatitis B.  Haemophilus influenzae type b (Hib) vaccine. You may need this if you have certain conditions.  Talk to your health care provider about which screenings and vaccines you need and how often you need them. This information is not intended to replace advice given to you by your health care provider. Make sure you discuss any questions you have with your health care provider. Document Released: 06/08/2015 Document Revised: 01/30/2016 Document Reviewed: 03/13/2015 Elsevier Interactive Patient Education  2018 Elsevier Inc.  

## 2018-03-27 LAB — VITAMIN D 1,25 DIHYDROXY
VITAMIN D 1, 25 (OH) TOTAL: 51 pg/mL (ref 18–72)
VITAMIN D3 1, 25 (OH): 8 pg/mL
Vitamin D2 1, 25 (OH)2: 43 pg/mL

## 2018-03-29 ENCOUNTER — Other Ambulatory Visit: Payer: Self-pay | Admitting: Family Medicine

## 2018-03-29 MED ORDER — VITAMIN D (ERGOCALCIFEROL) 1.25 MG (50000 UNIT) PO CAPS: ORAL_CAPSULE | ORAL | 1 refills | Status: DC

## 2018-04-26 ENCOUNTER — Inpatient Hospital Stay: Payer: BLUE CROSS/BLUE SHIELD | Attending: Family | Admitting: Family

## 2018-04-26 ENCOUNTER — Other Ambulatory Visit: Payer: Self-pay

## 2018-04-26 ENCOUNTER — Inpatient Hospital Stay: Payer: BLUE CROSS/BLUE SHIELD

## 2018-04-26 ENCOUNTER — Telehealth: Payer: Self-pay | Admitting: Family

## 2018-04-26 ENCOUNTER — Encounter: Payer: Self-pay | Admitting: Family

## 2018-04-26 VITALS — BP 144/84 | HR 52 | Temp 97.6°F | Resp 18 | Wt 245.0 lb

## 2018-04-26 DIAGNOSIS — D508 Other iron deficiency anemias: Secondary | ICD-10-CM

## 2018-04-26 DIAGNOSIS — D75839 Thrombocytosis, unspecified: Secondary | ICD-10-CM

## 2018-04-26 DIAGNOSIS — R7989 Other specified abnormal findings of blood chemistry: Secondary | ICD-10-CM | POA: Diagnosis not present

## 2018-04-26 DIAGNOSIS — Z9884 Bariatric surgery status: Secondary | ICD-10-CM | POA: Diagnosis not present

## 2018-04-26 DIAGNOSIS — D473 Essential (hemorrhagic) thrombocythemia: Secondary | ICD-10-CM

## 2018-04-26 DIAGNOSIS — K912 Postsurgical malabsorption, not elsewhere classified: Secondary | ICD-10-CM

## 2018-04-26 LAB — CBC WITH DIFFERENTIAL (CANCER CENTER ONLY)
Abs Immature Granulocytes: 0.03 10*3/uL (ref 0.00–0.07)
BASOS PCT: 1 %
Basophils Absolute: 0 10*3/uL (ref 0.0–0.1)
EOS ABS: 0.1 10*3/uL (ref 0.0–0.5)
EOS PCT: 1 %
HCT: 39.9 % (ref 36.0–46.0)
Hemoglobin: 12.6 g/dL (ref 12.0–15.0)
Immature Granulocytes: 0 %
Lymphocytes Relative: 37 %
Lymphs Abs: 2.9 10*3/uL (ref 0.7–4.0)
MCH: 28.8 pg (ref 26.0–34.0)
MCHC: 31.6 g/dL (ref 30.0–36.0)
MCV: 91.3 fL (ref 80.0–100.0)
MONO ABS: 0.5 10*3/uL (ref 0.1–1.0)
MONOS PCT: 6 %
Neutro Abs: 4.4 10*3/uL (ref 1.7–7.7)
Neutrophils Relative %: 55 %
PLATELETS: 493 10*3/uL — AB (ref 150–400)
RBC: 4.37 MIL/uL (ref 3.87–5.11)
RDW: 13.2 % (ref 11.5–15.5)
WBC: 7.8 10*3/uL (ref 4.0–10.5)
nRBC: 0 % (ref 0.0–0.2)

## 2018-04-26 LAB — CMP (CANCER CENTER ONLY)
ALT: 11 U/L (ref 0–44)
ANION GAP: 10 (ref 5–15)
AST: 15 U/L (ref 15–41)
Albumin: 4.2 g/dL (ref 3.5–5.0)
Alkaline Phosphatase: 126 U/L (ref 38–126)
BUN: 15 mg/dL (ref 6–20)
CALCIUM: 8.6 mg/dL — AB (ref 8.9–10.3)
CHLORIDE: 99 mmol/L (ref 98–111)
CO2: 30 mmol/L (ref 22–32)
Creatinine: 0.6 mg/dL (ref 0.44–1.00)
Glucose, Bld: 104 mg/dL — ABNORMAL HIGH (ref 70–99)
Potassium: 3.1 mmol/L — ABNORMAL LOW (ref 3.5–5.1)
SODIUM: 139 mmol/L (ref 135–145)
Total Bilirubin: 0.3 mg/dL (ref 0.3–1.2)
Total Protein: 7 g/dL (ref 6.5–8.1)

## 2018-04-26 LAB — FERRITIN: Ferritin: 40 ng/mL (ref 11–307)

## 2018-04-26 LAB — IRON AND TIBC
IRON: 62 ug/dL (ref 41–142)
Saturation Ratios: 20 % — ABNORMAL LOW (ref 21–57)
TIBC: 305 ug/dL (ref 236–444)
UIBC: 243 ug/dL (ref 120–384)

## 2018-04-26 LAB — LACTATE DEHYDROGENASE: LDH: 220 U/L — AB (ref 98–192)

## 2018-04-26 NOTE — Telephone Encounter (Signed)
Appointment scheduled for IRON infusion and patient notified per 12/2 sch msg

## 2018-04-26 NOTE — Progress Notes (Signed)
Hematology and Oncology Follow Up Visit  [name] [id number] Nov 18, 1965 52 y.o. 04/26/2018   Principle Diagnosis:  Thrombocytosis Family history of lymphoma (father) Iron deficiency anemia secondary to malabsorption with bariatric surgery(2012)  Current Therapy:   Observation IV iron as indicated   Interim History: [name] is here today for follow-up. She still has generalized aches and pains due to the connective tissue disorder. She is feeling fatigued.  She has noted that she bruises easily. No episodes of bleeding, no bruising or petechiae.  She has occasional dizziness and feels off balance. No falls or syncopal episodes to report.  She has SOB with over exertion.  No fever, chills, n/v, cough, rash, chest pain, palpitations, abdominal pain or changes in bowel or bladder habits.  She has intermittent numbness and tingling in her arms and feet due to chronic neck and lower back issues.  She is taking hydrochlorothiazide which has helped reduce any swelling.   She has maintained a good appetite and is staying well hydrated. Her weight is stable.  ECOG Performance Status: 1 - Symptomatic but completely ambulatory  Medications:  Allergies as of 04/26/2018      Reactions   Penicillins Other (See Comments)   Causes severe, difficult to treat yeast infections      Medication List        Accurate as of 04/26/18 10:18 AM. Always use your most recent med list.          Biotin 10 MG Caps biotin   celecoxib 200 MG capsule Commonly known as:  CELEBREX Take 200 mg by mouth daily.   Glucosamine Sulfate 1000 MG Caps Take by mouth.   hydrochlorothiazide 25 MG tablet Commonly known as:  HYDRODIURIL TAKE 1 TABLET BY MOUTH EVERY DAY   ibuprofen 600 MG tablet Commonly known as:  ADVIL,MOTRIN 1  po  pc  every 6 hours x 5 days then as needed for pain   ipratropium 0.03 % nasal spray Commonly known as:  ATROVENT Place 1 spray into both nostrils 2 (two) times  daily.   KLOR-CON M20 20 MEQ tablet Generic drug:  potassium chloride SA TAKE 1 TABLET BY MOUTH EVERY DAY   levothyroxine 88 MCG tablet Commonly known as:  SYNTHROID, LEVOTHROID TAKE 1 TABLET BY MOUTH EVERY DAY   loratadine 10 MG tablet Commonly known as:  CLARITIN TAKE 1 TABLET BY MOUTH EVERY DAY   losartan 100 MG tablet Commonly known as:  COZAAR Take 1 tablet (100 mg total) by mouth daily.   magnesium 30 MG tablet Take 30 mg by mouth 2 (two) times daily.   omeprazole 40 MG capsule Commonly known as:  PRILOSEC Take 1 capsule (40 mg total) by mouth daily.   Turmeric 500 MG Caps Take 500 mg by mouth daily. Take 500 mg by mouth daily.   VITAMIN B-12 PO Take 1 tablet by mouth daily.   Vitamin D (Ergocalciferol) 1.25 MG (50000 UT) Caps capsule Commonly known as:  DRISDOL TAKE ONE CAPSULE BY MOUTH ONE TIME PER WEEK       Allergies:  Allergies  Allergen Reactions  . Penicillins Other (See Comments)    Causes severe, difficult to treat yeast infections    Past Medical History, Surgical history, Social history, and Family History were reviewed and updated.  Review of Systems: All other 10 point review of systems is negative.   Physical Exam:  weight is 245 lb (111.1 kg). Her oral temperature is 97.6 F (36.4 C). Her blood pressure  is 144/84 (abnormal) and her pulse is 52 (abnormal). Her respiration is 18 and oxygen saturation is 100%.   Wt Readings from Last 3 Encounters:  04/26/18 245 lb (111.1 kg)  03/23/18 242 lb (109.8 kg)  11/13/17 239 lb (108.4 kg)    Ocular: Sclerae unicteric, pupils equal, round and reactive to light Ear-nose-throat: Oropharynx clear, dentition fair Lymphatic: No cervical, supraclavicular or adenopathy Lungs no rales or rhonchi, good excursion bilaterally Heart regular rate and rhythm, no murmur appreciated Abd soft, nontender, positive bowel sounds, no liver or spleen tip palpated on exam, no fluid wave  MSK no focal spinal  tenderness, no joint edema Neuro: non-focal, well-oriented, appropriate affect Breasts: Deferred   Lab Results  Component Value Date   WBC 7.8 04/26/2018   HGB 12.6 04/26/2018   HCT 39.9 04/26/2018   MCV 91.3 04/26/2018   PLT 493 (H) 04/26/2018   Lab Results  Component Value Date   FERRITIN 37.5 03/23/2018   IRON 73 03/23/2018   TIBC 299 10/26/2017   UIBC 205 10/26/2017   IRONPCTSAT 21.8 03/23/2018   Lab Results  Component Value Date   RETICCTPCT 1.8 04/19/2014   RBC 4.37 04/26/2018   RETICCTABS 79.2 04/19/2014   No results found for: KPAFRELGTCHN, LAMBDASER, KAPLAMBRATIO No results found for: Kandis Cocking, IGMSERUM No results found for: Odetta Pink, SPEI   Chemistry      Component Value Date/Time   NA 139 04/26/2018 0913   NA 138 04/27/2017 0933   K 3.1 (L) 04/26/2018 0913   K 3.3 (L) 04/27/2017 0933   CL 99 04/26/2018 0913   CO2 30 04/26/2018 0913   CO2 26 04/27/2017 0933   BUN 15 04/26/2018 0913   BUN 12.2 04/27/2017 0933   CREATININE 0.60 04/26/2018 0913   CREATININE 0.7 04/27/2017 0933      Component Value Date/Time   CALCIUM 8.6 (L) 04/26/2018 0913   CALCIUM 9.1 04/27/2017 0933   ALKPHOS 126 04/26/2018 0913   ALKPHOS 139 04/27/2017 0933   AST 15 04/26/2018 0913   AST 25 04/27/2017 0933   ALT 11 04/26/2018 0913   ALT 19 04/27/2017 0933   BILITOT 0.3 04/26/2018 0913   BILITOT 0.58 04/27/2017 0933       Impression and Plan: [name] is a very pleasant 52 yo African American female with history of mild thrombocytosis and iron deficiency secondary to malabsorption after bariatric surgery in 2012. She is symptomatic with fatigue and SOB with exertion.  Platelet count today is 493.  We will see what her iron studies show and bring her back in for infusion if needed.  We will plan to see her in another 6 month.  She will contact our office with any questions or concerns. We can certainly see her  sooner if need be.   Laverna Peace, NP 12/2/201910:18 AM

## 2018-04-27 ENCOUNTER — Ambulatory Visit: Payer: BLUE CROSS/BLUE SHIELD

## 2018-04-27 ENCOUNTER — Inpatient Hospital Stay: Payer: BLUE CROSS/BLUE SHIELD

## 2018-04-27 VITALS — BP 125/65 | HR 60 | Temp 97.9°F | Resp 18

## 2018-04-27 DIAGNOSIS — D508 Other iron deficiency anemias: Secondary | ICD-10-CM

## 2018-04-27 MED ORDER — SODIUM CHLORIDE 0.9 % IV SOLN
510.0000 mg | Freq: Once | INTRAVENOUS | Status: AC
  Administered 2018-04-27: 510 mg via INTRAVENOUS
  Filled 2018-04-27: qty 17

## 2018-04-27 MED ORDER — SODIUM CHLORIDE 0.9 % IV SOLN
Freq: Once | INTRAVENOUS | Status: AC
  Administered 2018-04-27: 12:00:00 via INTRAVENOUS
  Filled 2018-04-27: qty 250

## 2018-04-27 NOTE — Patient Instructions (Addendum)

## 2018-04-30 ENCOUNTER — Telehealth: Payer: Self-pay | Admitting: *Deleted

## 2018-04-30 ENCOUNTER — Encounter: Payer: Self-pay | Admitting: Family

## 2018-04-30 NOTE — Telephone Encounter (Addendum)
Error

## 2018-05-03 ENCOUNTER — Other Ambulatory Visit: Payer: Self-pay | Admitting: Family Medicine

## 2018-06-23 ENCOUNTER — Other Ambulatory Visit: Payer: Self-pay | Admitting: Family Medicine

## 2018-06-24 NOTE — Telephone Encounter (Signed)
Dr Carollee Herter -- Please see HCTZ refill protocol failures and advise?

## 2018-07-04 ENCOUNTER — Other Ambulatory Visit: Payer: Self-pay | Admitting: Family Medicine

## 2018-09-09 ENCOUNTER — Encounter: Payer: Self-pay | Admitting: Family

## 2018-09-19 ENCOUNTER — Other Ambulatory Visit: Payer: Self-pay | Admitting: Family Medicine

## 2018-09-19 DIAGNOSIS — I10 Essential (primary) hypertension: Secondary | ICD-10-CM

## 2018-09-20 ENCOUNTER — Other Ambulatory Visit: Payer: Self-pay | Admitting: *Deleted

## 2018-09-20 MED ORDER — HYDROCHLOROTHIAZIDE 25 MG PO TABS: 25.0000 mg | ORAL_TABLET | Freq: Every day | ORAL | 0 refills | Status: DC

## 2018-09-24 ENCOUNTER — Telehealth: Payer: Self-pay | Admitting: Family Medicine

## 2018-09-24 ENCOUNTER — Telehealth: Payer: Self-pay

## 2018-09-24 NOTE — Telephone Encounter (Signed)
Left message on machine to let patient know that she has been given a 3 months supply and that she should call the customer service on the back of her card and they can give her information on primary care physicians.

## 2018-09-24 NOTE — Telephone Encounter (Signed)
Copied from [place] [id number]. Topic: General - Inquiry >> Sep 24, 2018  9:11 AM Virl Axe D wrote: Reason for CRM: Pt stated her insurance changed to [employer] under [place]. She would like to speak with Princess about what Dr. Etter Sjogren recommends during this time. She has rx's that will need to be refilled and will not be able to find a new provider before then.

## 2018-09-24 NOTE — Telephone Encounter (Signed)
Patient wanted to cancel app for Monday stated she had insurance issues [facility].. Informed patient that we are Out of Network with her plan and if she has visit it will be applied to her OON benefits. She stated she would call her insurance and get OON benefit information. I explained to her that She WOULD NOT QUALIFY FOR SELF PAY DUE TO HER HAVING INSURANCE. ( PATIENT UNDERSTOOD) She stated she will call back if her OON benefits was an option for her

## 2018-09-27 ENCOUNTER — Ambulatory Visit: Payer: BLUE CROSS/BLUE SHIELD | Admitting: Family Medicine

## 2018-10-08 ENCOUNTER — Other Ambulatory Visit: Payer: Self-pay | Admitting: Family Medicine

## 2018-10-08 DIAGNOSIS — J302 Other seasonal allergic rhinitis: Secondary | ICD-10-CM

## 2018-10-23 ENCOUNTER — Other Ambulatory Visit: Payer: Self-pay | Admitting: Family Medicine

## 2018-10-25 ENCOUNTER — Ambulatory Visit: Payer: BLUE CROSS/BLUE SHIELD | Admitting: Family

## 2018-10-25 ENCOUNTER — Other Ambulatory Visit: Payer: BLUE CROSS/BLUE SHIELD

## 2018-10-31 ENCOUNTER — Other Ambulatory Visit: Payer: Self-pay | Admitting: Family Medicine

## 2018-10-31 DIAGNOSIS — E039 Hypothyroidism, unspecified: Secondary | ICD-10-CM

## 2018-11-11 DIAGNOSIS — E559 Vitamin D deficiency, unspecified: Secondary | ICD-10-CM | POA: Insufficient documentation

## 2018-11-11 DIAGNOSIS — D75839 Thrombocytosis, unspecified: Secondary | ICD-10-CM | POA: Insufficient documentation

## 2018-11-11 DIAGNOSIS — E876 Hypokalemia: Secondary | ICD-10-CM | POA: Insufficient documentation

## 2018-11-11 DIAGNOSIS — E538 Deficiency of other specified B group vitamins: Secondary | ICD-10-CM | POA: Insufficient documentation

## 2018-11-11 DIAGNOSIS — R001 Bradycardia, unspecified: Secondary | ICD-10-CM | POA: Insufficient documentation

## 2018-11-23 ENCOUNTER — Other Ambulatory Visit: Payer: Self-pay | Admitting: Family Medicine

## 2018-11-23 DIAGNOSIS — E039 Hypothyroidism, unspecified: Secondary | ICD-10-CM

## 2018-12-11 ENCOUNTER — Other Ambulatory Visit: Payer: Self-pay | Admitting: Family Medicine

## 2018-12-11 DIAGNOSIS — I10 Essential (primary) hypertension: Secondary | ICD-10-CM

## 2018-12-19 ENCOUNTER — Other Ambulatory Visit: Payer: Self-pay | Admitting: Family Medicine

## 2019-01-06 ENCOUNTER — Other Ambulatory Visit: Payer: Self-pay | Admitting: Family Medicine

## 2019-01-06 DIAGNOSIS — I10 Essential (primary) hypertension: Secondary | ICD-10-CM

## 2019-03-14 ENCOUNTER — Other Ambulatory Visit: Payer: Self-pay | Admitting: Family Medicine

## 2019-04-04 ENCOUNTER — Other Ambulatory Visit: Payer: Self-pay | Admitting: Family Medicine

## 2019-04-07 ENCOUNTER — Other Ambulatory Visit: Payer: Self-pay | Admitting: Family Medicine

## 2019-06-07 ENCOUNTER — Other Ambulatory Visit: Payer: Self-pay

## 2019-06-07 ENCOUNTER — Encounter: Payer: Self-pay | Admitting: Emergency Medicine

## 2019-06-07 ENCOUNTER — Ambulatory Visit
Admission: EM | Admit: 2019-06-07 | Discharge: 2019-06-07 | Disposition: A | Discharge status: Home / Self Care | Payer: 59 | Attending: Emergency Medicine | Admitting: Emergency Medicine

## 2019-06-07 DIAGNOSIS — B9789 Other viral agents as the cause of diseases classified elsewhere: Secondary | ICD-10-CM

## 2019-06-07 DIAGNOSIS — Z20822 Contact with and (suspected) exposure to covid-19: Secondary | ICD-10-CM

## 2019-06-07 DIAGNOSIS — R0981 Nasal congestion: Secondary | ICD-10-CM

## 2019-06-07 DIAGNOSIS — I10 Essential (primary) hypertension: Secondary | ICD-10-CM | POA: Diagnosis not present

## 2019-06-07 DIAGNOSIS — J0100 Acute maxillary sinusitis, unspecified: Secondary | ICD-10-CM

## 2019-06-07 MED ORDER — AMOXICILLIN-POT CLAVULANATE 875-125 MG PO TABS: 1.0000 | ORAL_TABLET | Freq: Two times a day (BID) | ORAL | 0 refills | Status: DC

## 2019-06-07 MED ORDER — FLUCONAZOLE 200 MG PO TABS: 200.0000 mg | ORAL_TABLET | Freq: Once | ORAL | 0 refills | Status: AC

## 2019-06-07 NOTE — Discharge Instructions (Signed)
Your COVID test is pending - it is important to quarantine / isolate at home until your results are back. °If you test positive and would like further evaluation for persistent or worsening symptoms, you may schedule an E-visit or virtual (video) visit throughout the [facility] MyChart app or website. ° °PLEASE NOTE: If you develop severe chest pain or shortness of breath please go to the ER or call 9-1-1 for further evaluation --> DO NOT schedule electronic or virtual visits for this. °Please call our office for further guidance / recommendations as needed. °

## 2019-06-07 NOTE — ED Triage Notes (Signed)
Pt presents to UC for assessment of 10 days nasal congestion, nasal pressure and nasal bridge pain.  Pt c/o of not being able to smell, due to nose being stuffed up.  States CPAP is causing he rpain and worsening symptoms each morning.  C/o dry eye, c/o cough.  Denies sore throat, c/o nausea this morning upon waking.  Has tried sudafed without relief.

## 2019-06-07 NOTE — ED Provider Notes (Signed)
[facility]    CSN: [id number] Arrival date & time: 06/07/19  1317      History   Chief Complaint Chief Complaint  Patient presents with  . URI    HPI [name] is a 54 y.o. female with history of COPD, allergies, hypertension, OSA using CPAP presented for 10-day course of sinus congestion, pressure, pain.  Has tried Sudafed, allergy medications, nasal sprays, saline rinses without adequate relief.  Patient states most bothersome symptom is pain.  Patient does have mild, dry cough: No shortness of breath or chest pain.  No fever or known Covid contact.   Past Medical History:  Diagnosis Date  . Allergic rhinitis, cause unspecified   . Anemia, iron deficiency   . COPD (chronic obstructive pulmonary disease) ([place])   . Diastolic dysfunction   . Diverticulosis of colon   . GERD (gastroesophageal reflux disease)   . Goiter   . Hiatal hernia   . Hypertension   . IBS (irritable bowel syndrome)   . Morbid obesity ([place])   . Obstructive sleep apnea   . Thrombocytosis ([place])   . Undiagnosed cardiac murmurs     Patient Active Problem List   Diagnosis Date Noted  . Bleeding 12/02/2017  . Polymenorrhea 12/02/2017  . Polyp of cervix 12/02/2017  . Urinary incontinence 12/02/2017  . Urinary tract infectious disease 12/02/2017  . Right arm pain 09/14/2017  . Hypersomnia 06/05/2016  . Abdominal pain, chronic, epigastric 04/13/2015  . Irregular bleeding 06/22/2014  . Obesity (BMI 30-39.9) 07/25/2013  . Breast mass 07/25/2013  . Changing mole 01/28/2013  . Dysphagia, pharyngoesophageal phase 03/10/2011  . Bariatric surgery status 03/10/2011  . Esophageal ring 03/10/2011  . SPINAL STENOSIS, LUMBAR 04/08/2010  . HIATAL HERNIA 03/08/2010  . DYSPNEA 12/13/2009  . DIASTOLIC DYSFUNCTION [id number]  . ECHOCARDIOGRAM, ABNORMAL 12/11/2009  . PALPITATIONS, OCCASIONAL 12/05/2009  . CARDIAC MURMUR 12/05/2009  . PAIN IN JOINT, MULTIPLE SITES 07/11/2009  . MALAISE AND  FATIGUE 07/05/2009  . Obstructive sleep apnea 09/25/2008  . URI 01/04/2008  . GERD 01/04/2008  . IBS 01/04/2008  . DIARRHEA 01/04/2008  . Goiter 11/30/2007  . MORBID OBESITY 11/22/2007  . ANEMIA 08/23/2007  . Allergic rhinitis 08/23/2007  . DIVERTICULOSIS, COLON 08/23/2007  . HEADACHE 06/18/2007  . SINUSITIS 12/24/2006  . CHEST PAIN 12/24/2006  . Other iron deficiency anemias 12/23/2006  . Hypothyroidism 12/17/2006  . Disease of blood and blood forming organ 12/17/2006  . Essential hypertension 12/17/2006  . LOW BACK PAIN 12/17/2006    Past Surgical History:  Procedure Laterality Date  . BARIATRIC SURGERY  06/17/2010  . BILATERAL SALPINGECTOMY Bilateral 06/22/2014   Procedure: BILATERAL SALPINGECTOMY;  Surgeon: Crawford Givens, MD;  Location: [facility];  Service: Gynecology;  Laterality: Bilateral;  . CYSTOSCOPY N/A 06/22/2014   Procedure: CYSTOSCOPY;  Surgeon: Crawford Givens, MD;  Location: [facility];  Service: Gynecology;  Laterality: N/A;  . ENDOMETRIAL ABLATION  12/08  . FOOT SURGERY Left 11/24/2017   Dr Paulla Dolly  . LAPAROSCOPIC ASSISTED VAGINAL HYSTERECTOMY N/A 06/22/2014   Procedure: LAPAROSCOPIC ASSISTED VAGINAL HYSTERECTOMY;  Surgeon: Crawford Givens, MD;  Location: [facility];  Service: Gynecology;  Laterality: N/A;  . LIPOMA EXCISION     upper back  . SPINE SURGERY  02/10/2012   L4-l5 fusion--cohen    OB History   No obstetric history on file.      Home Medications    Prior to Admission medications   Medication Sig Start Date End Date Taking? Authorizing Provider  celecoxib (CELEBREX) 200 MG capsule Take 200 mg by mouth daily. 09/25/16  Yes [provider]  amoxicillin-clavulanate (AUGMENTIN) 875-125 MG tablet Take 1 tablet by mouth every 12 (twelve) hours. 06/07/19   Hall-Potvin, [place], PA-C  Biotin 10 MG CAPS biotin    [provider]  Cyanocobalamin (VITAMIN B-12 PO) Take 1 tablet by mouth daily.    [provider]  fluconazole (DIFLUCAN) 200 MG  tablet Take 1 tablet (200 mg total) by mouth once for 1 dose. May repeat in 72 hours if needed 06/07/19 06/07/19  Hall-Potvin, [place], PA-C  Glucosamine Sulfate 1000 MG CAPS Take by mouth.    [provider]  hydrochlorothiazide (HYDRODIURIL) 25 MG tablet TAKE 1 TABLET BY MOUTH EVERY DAY 03/14/19   Carollee Herter, Alferd Apa, DO  ibuprofen (ADVIL,MOTRIN) 600 MG tablet 1  po  pc  every 6 hours x 5 days then as needed for pain 06/23/14   Earnstine Regal, PA-C  ipratropium (ATROVENT) 0.03 % nasal spray PLACE 1 SPRAY INTO BOTH NOSTRILS 2 (TWO) TIMES DAILY. 10/11/18   Ann Held, DO  KLOR-CON M20 20 MEQ tablet TAKE 1 TABLET BY MOUTH EVERY DAY 07/05/18   Carollee Herter, Alferd Apa, DO  levothyroxine (SYNTHROID) 88 MCG tablet TAKE 1 TABLET BY MOUTH EVERY DAY 11/23/18   Carollee Herter, Alferd Apa, DO  loratadine (CLARITIN) 10 MG tablet TAKE 1 TABLET BY MOUTH EVERY DAY 03/23/18   Carollee Herter, Alferd Apa, DO  losartan (COZAAR) 100 MG tablet TAKE 1 TABLET BY MOUTH EVERY DAY 12/13/18   Roma Schanz R, DO  magnesium 30 MG tablet Take 30 mg by mouth 2 (two) times daily.    [provider]  omeprazole (PRILOSEC) 40 MG capsule Take 1 capsule (40 mg total) by mouth daily. 04/23/16   Ann Held, DO  Turmeric 500 MG CAPS Take 500 mg by mouth daily. Take 500 mg by mouth daily.    [provider]  Vitamin D, Ergocalciferol, (DRISDOL) 1.25 MG (50000 UT) CAPS capsule TAKE ONE CAPSULE BY MOUTH ONCE WEEKLY 05/14/18   Ann Held, DO  Vitamin D, Ergocalciferol, (DRISDOL) 1.25 MG (50000 UT) CAPS capsule TAKE 1 CAPSULE BY MOUTH ONCE A WEEK 10/25/18   Ann Held, DO    Family History Family History  Problem Relation Age of Onset  . Breast cancer Mother   . Arrhythmia Mother   . Heart disease Mother   . Cancer Mother 66       breast  . Diabetes Mellitus II Mother   . Hypertension Father   . Kidney cancer Father   . Colon polyps Father   . Lymphoma Father         Diffused Large B-cell Lymphoma  . Cancer Father        lymphoma  . Cancer Maternal Aunt 88       breast cancer  . Colon cancer Neg Hx     Social History Social History   Tobacco Use  . Smoking status: Never Smoker  . Smokeless tobacco: Never Used  . Tobacco comment: Never Used Tobacco  Substance Use Topics  . Alcohol use: No    Alcohol/week: 0.0 standard drinks  . Drug use: No     Allergies   Penicillins   Review of Systems Review of Systems  Constitutional: Negative for activity change, appetite change, fatigue and fever.  HENT: Positive for congestion, postnasal drip, sinus pressure and sinus pain. Negative for dental  problem, ear pain, facial swelling, hearing loss, rhinorrhea, sore throat, trouble swallowing and voice change.   Eyes: Negative for photophobia, pain and visual disturbance.  Respiratory: Positive for cough. Negative for shortness of breath.        Mild, dry, infrequent  Cardiovascular: Negative for chest pain and palpitations.  Gastrointestinal: Negative for diarrhea and vomiting.  Musculoskeletal: Negative for arthralgias and myalgias.  Neurological: Negative for dizziness and headaches.     Physical Exam Triage Vital Signs ED Triage Vitals  Enc Vitals Group     BP 06/07/19 1327 (!) 173/84     Pulse Rate 06/07/19 1327 65     Resp 06/07/19 1327 18     Temp 06/07/19 1327 (!) 97.3 F (36.3 C)     Temp Source 06/07/19 1327 Temporal     SpO2 06/07/19 1327 98 %     Weight --      Height --      Head Circumference --      Peak Flow --      Pain Score 06/07/19 1328 6     Pain Loc --      Pain Edu? --      Excl. in [place]? --    No data found.  Updated Vital Signs BP (!) 173/84 (BP Location: Left Arm)   Pulse 65   Temp (!) 97.3 F (36.3 C) (Temporal)   Resp 18   LMP 01/20/2011   SpO2 98%   Visual Acuity Right Eye Distance:   Left Eye Distance:   Bilateral Distance:    Right Eye Near:   Left Eye Near:    Bilateral Near:     Physical  Exam Constitutional:      General: She is not in acute distress.    Appearance: She is obese. She is not ill-appearing or diaphoretic.  HENT:     Head: Normocephalic and atraumatic.     Jaw: There is normal jaw occlusion. No tenderness or pain on movement.     Right Ear: Hearing, tympanic membrane, ear canal and external ear normal. No tenderness. No mastoid tenderness.     Left Ear: Hearing, tympanic membrane, ear canal and external ear normal. No tenderness. No mastoid tenderness.     Nose: Congestion present. No nasal deformity, septal deviation or nasal tenderness.     Right Turbinates: Swollen. Not pale.     Left Turbinates: Swollen. Not pale.     Right Sinus: Maxillary sinus tenderness present. No frontal sinus tenderness.     Left Sinus: Maxillary sinus tenderness present. No frontal sinus tenderness.     Mouth/Throat:     Lips: Pink. No lesions.     Mouth: Mucous membranes are moist. No injury.     Pharynx: Oropharynx is clear. Uvula midline. No posterior oropharyngeal erythema or uvula swelling.     Comments: no tonsillar exudate or hypertrophy Eyes:     General: No scleral icterus.    Conjunctiva/sclera: Conjunctivae normal.     Pupils: Pupils are equal, round, and reactive to light.  Cardiovascular:     Rate and Rhythm: Normal rate.  Pulmonary:     Effort: Pulmonary effort is normal.  Musculoskeletal:     Cervical back: Normal range of motion and neck supple. No tenderness. No muscular tenderness.  Lymphadenopathy:     Cervical: No cervical adenopathy.  Neurological:     Mental Status: She is alert and oriented to person, place, and time.      UC Treatments /  Results  Labs (all labs ordered are listed, but only abnormal results are displayed) Labs Reviewed  NOVEL CORONAVIRUS, NAA    EKG   Radiology No results found.  Procedures Procedures (including critical care time)  Medications Ordered in UC Medications - No data to display  Initial Impression /  Assessment and Plan / UC Course  I have reviewed the triage vital signs and the nursing notes.  Pertinent labs & imaging results that were available during my care of the patient were reviewed by me and considered in my medical decision making (see chart for details).     Patient afebrile, nontoxic, with SpO2 98%.  H&P consistent with maxillary sinusitis, will start Augmentin as outlined below she has tolerated this well in the past.  Patient reporting yeast infection status post antibiotic use: Diflucan sent.  Given global pandemic, patient requesting Covid testing-PCR pending.  Patient to quarantine until results are back.  We will continue supportive management.  Return precautions discussed, patient verbalized understanding and is agreeable to plan. Final Clinical Impressions(s) / UC Diagnoses   Final diagnoses:  Nasal congestion  Acute maxillary sinusitis, recurrence not specified     Discharge Instructions     Your COVID test is pending - it is important to quarantine / isolate at home until your results are back. If you test positive and would like further evaluation for persistent or worsening symptoms, you may schedule an E-visit or virtual (video) visit throughout the [facility] app or website.  PLEASE NOTE: If you develop severe chest pain or shortness of breath please go to the ER or call [phone] for further evaluation --> DO NOT schedule electronic or virtual visits for this. Please call our office for further guidance / recommendations as needed.    ED Prescriptions    Medication Sig Dispense Auth. Provider   fluconazole (DIFLUCAN) 200 MG tablet Take 1 tablet (200 mg total) by mouth once for 1 dose. May repeat in 72 hours if needed 2 tablet Hall-Potvin, [place], PA-C   amoxicillin-clavulanate (AUGMENTIN) 875-125 MG tablet Take 1 tablet by mouth every 12 (twelve) hours. 14 tablet Hall-Potvin, [place], PA-C     PDMP not reviewed this encounter.   Hall-Potvin,  [place], [place] 06/07/19 1356

## 2019-06-08 LAB — NOVEL CORONAVIRUS, NAA: SARS-CoV-2, NAA: DETECTED — AB

## 2019-06-09 ENCOUNTER — Telehealth (HOSPITAL_COMMUNITY): Payer: Self-pay | Admitting: Emergency Medicine

## 2019-06-09 NOTE — Telephone Encounter (Signed)

## 2019-06-14 ENCOUNTER — Other Ambulatory Visit: Payer: Self-pay | Admitting: Family

## 2019-07-25 ENCOUNTER — Other Ambulatory Visit: Payer: Self-pay | Admitting: Family Medicine

## 2019-07-25 DIAGNOSIS — Z1231 Encounter for screening mammogram for malignant neoplasm of breast: Secondary | ICD-10-CM

## 2019-08-26 ENCOUNTER — Ambulatory Visit
Admission: RE | Admit: 2019-08-26 | Discharge: 2019-08-26 | Disposition: A | Discharge status: Home / Self Care | Payer: 59 | Source: Ambulatory Visit | Attending: Family Medicine | Admitting: Family Medicine

## 2019-08-26 ENCOUNTER — Other Ambulatory Visit: Payer: Self-pay

## 2019-08-26 DIAGNOSIS — Z1231 Encounter for screening mammogram for malignant neoplasm of breast: Secondary | ICD-10-CM

## 2019-08-29 LAB — BASIC METABOLIC PANEL
BUN: 15 (ref 4–21)
CO2: 27 — AB (ref 13–22)
Chloride: 100 (ref 99–108)
Creatinine: 0.7 (ref 0.5–1.1)
Glucose: 81
Potassium: 3.9 (ref 3.4–5.3)
Sodium: 140 (ref 137–147)

## 2019-08-29 LAB — CBC AND DIFFERENTIAL
HCT: 40 (ref 36–46)
Hemoglobin: 13.3 (ref 12.0–16.0)
Platelets: 472 — AB (ref 150–399)
WBC: 7.8

## 2019-08-29 LAB — CBC: RBC: 4.49 (ref 3.87–5.11)

## 2019-08-29 LAB — IRON,TIBC AND FERRITIN PANEL
%SAT: 36
Iron: 95
TIBC: 262
UIBC: 167

## 2019-08-29 LAB — HEPATIC FUNCTION PANEL
ALT: 16 (ref 7–35)
AST: 21 (ref 13–35)
Alkaline Phosphatase: 144 — AB (ref 25–125)
Bilirubin, Total: 0.3

## 2019-08-29 LAB — POCT ERYTHROCYTE SEDIMENTATION RATE, NON-AUTOMATED: Sed Rate: 11

## 2019-08-29 LAB — COMPREHENSIVE METABOLIC PANEL: Calcium: 9.5 (ref 8.7–10.7)

## 2019-09-01 ENCOUNTER — Ambulatory Visit
Admission: EM | Admit: 2019-09-01 | Discharge: 2019-09-01 | Disposition: A | Discharge status: Home / Self Care | Payer: 59 | Attending: Emergency Medicine | Admitting: Emergency Medicine

## 2019-09-01 ENCOUNTER — Ambulatory Visit (INDEPENDENT_AMBULATORY_CARE_PROVIDER_SITE_OTHER): Payer: 59

## 2019-09-01 DIAGNOSIS — M79641 Pain in right hand: Secondary | ICD-10-CM

## 2019-09-01 DIAGNOSIS — W19XXXA Unspecified fall, initial encounter: Secondary | ICD-10-CM | POA: Diagnosis not present

## 2019-09-01 NOTE — ED Triage Notes (Signed)
Pt states she fell straight back on the ground while spraying wasp 1.5hrs ago. Pt c/o rt hand pain.

## 2019-09-01 NOTE — ED Provider Notes (Signed)
[facility]    CSN: [id number] Arrival date & time: 09/01/19  1805      History   Chief Complaint Chief Complaint  Patient presents with   Fall    HPI [name] is a 54 y.o. female presenting for right thumb/wrist pain status post fall.  States this occurred a few hours ago.  States she fell back from ground-level when a wasp came out of the next that she was sprained.  Patient denies bee sting, head trauma, LOC.  Patient denies numbness, deformity.  Applied ice with some relief.    Past Medical History:  Diagnosis Date   Allergic rhinitis, cause unspecified    Anemia, iron deficiency    COPD (chronic obstructive pulmonary disease) ([facility])    Diastolic dysfunction    Diverticulosis of colon    GERD (gastroesophageal reflux disease)    Goiter    Hiatal hernia    Hypertension    IBS (irritable bowel syndrome)    Morbid obesity ([facility])    Obstructive sleep apnea    Thrombocytosis ([facility])    Undiagnosed cardiac murmurs     Patient Active Problem List   Diagnosis Date Noted   Bleeding 12/02/2017   Polymenorrhea 12/02/2017   Polyp of cervix 12/02/2017   Urinary incontinence 12/02/2017   Urinary tract infectious disease 12/02/2017   Right arm pain 09/14/2017   Hypersomnia 06/05/2016   Abdominal pain, chronic, epigastric 04/13/2015   Irregular bleeding 06/22/2014   Obesity (BMI 30-39.9) 07/25/2013   Breast mass 07/25/2013   Changing mole 01/28/2013   Dysphagia, pharyngoesophageal phase 03/10/2011   Bariatric surgery status 03/10/2011   Esophageal ring 03/10/2011   SPINAL STENOSIS, LUMBAR 04/08/2010   HIATAL HERNIA 03/08/2010   DYSPNEA 0000000   DIASTOLIC DYSFUNCTION A999333   ECHOCARDIOGRAM, ABNORMAL 12/11/2009   PALPITATIONS, OCCASIONAL 12/05/2009   CARDIAC MURMUR 12/05/2009   PAIN IN JOINT, MULTIPLE SITES 07/11/2009   MALAISE AND FATIGUE 07/05/2009   Obstructive sleep apnea 09/25/2008   URI  01/04/2008   GERD 01/04/2008   IBS 01/04/2008   DIARRHEA 01/04/2008   Goiter 11/30/2007   MORBID OBESITY 11/22/2007   ANEMIA 08/23/2007   Allergic rhinitis 08/23/2007   DIVERTICULOSIS, COLON 08/23/2007   HEADACHE 06/18/2007   SINUSITIS 12/24/2006   CHEST PAIN 12/24/2006   Other iron deficiency anemias 12/23/2006   Hypothyroidism 12/17/2006   Disease of blood and blood forming organ 12/17/2006   Essential hypertension 12/17/2006   LOW BACK PAIN 12/17/2006    Past Surgical History:  Procedure Laterality Date   BARIATRIC SURGERY  06/17/2010   BILATERAL SALPINGECTOMY Bilateral 06/22/2014   Procedure: BILATERAL SALPINGECTOMY;  Surgeon: Crawford Givens, MD;  Location: [facility];  Service: Gynecology;  Laterality: Bilateral;   CYSTOSCOPY N/A 06/22/2014   Procedure: CYSTOSCOPY;  Surgeon: Crawford Givens, MD;  Location: [facility];  Service: Gynecology;  Laterality: N/A;   ENDOMETRIAL ABLATION  12/08   FOOT SURGERY Left 11/24/2017   Dr Paulla Dolly   LAPAROSCOPIC ASSISTED VAGINAL HYSTERECTOMY N/A 06/22/2014   Procedure: LAPAROSCOPIC ASSISTED VAGINAL HYSTERECTOMY;  Surgeon: Crawford Givens, MD;  Location: [facility];  Service: Gynecology;  Laterality: N/A;   LIPOMA EXCISION     upper back   SPINE SURGERY  02/10/2012   L4-l5 fusion--cohen    OB History   No obstetric history on file.      Home Medications    Prior to Admission medications   Medication Sig Start Date End Date Taking? Authorizing Provider  Biotin 10 MG CAPS biotin  [provider]  celecoxib (CELEBREX) 200 MG capsule Take 200 mg by mouth daily. 09/25/16   [provider]  Cyanocobalamin (VITAMIN B-12 PO) Take 1 tablet by mouth daily.    [provider]  Glucosamine Sulfate 1000 MG CAPS Take by mouth.    [provider]  hydrochlorothiazide (HYDRODIURIL) 25 MG tablet TAKE 1 TABLET BY MOUTH EVERY DAY 03/14/19   Carollee Herter, Alferd Apa, DO  ibuprofen (ADVIL,MOTRIN) 600 MG tablet 1  po   pc  every 6 hours x 5 days then as needed for pain 06/23/14   Earnstine Regal, PA-C  ipratropium (ATROVENT) 0.03 % nasal spray PLACE 1 SPRAY INTO BOTH NOSTRILS 2 (TWO) TIMES DAILY. 10/11/18   Ann Held, DO  KLOR-CON M20 20 MEQ tablet TAKE 1 TABLET BY MOUTH EVERY DAY 07/05/18   Carollee Herter, Alferd Apa, DO  levothyroxine (SYNTHROID) 88 MCG tablet TAKE 1 TABLET BY MOUTH EVERY DAY 11/23/18   Carollee Herter, Alferd Apa, DO  loratadine (CLARITIN) 10 MG tablet TAKE 1 TABLET BY MOUTH EVERY DAY 03/23/18   Carollee Herter, Alferd Apa, DO  losartan (COZAAR) 100 MG tablet TAKE 1 TABLET BY MOUTH EVERY DAY 12/13/18   Roma Schanz R, DO  magnesium 30 MG tablet Take 30 mg by mouth 2 (two) times daily.    [provider]  omeprazole (PRILOSEC) 40 MG capsule Take 1 capsule (40 mg total) by mouth daily. 04/23/16   Ann Held, DO  Turmeric 500 MG CAPS Take 500 mg by mouth daily. Take 500 mg by mouth daily.    [provider]  Vitamin D, Ergocalciferol, (DRISDOL) 1.25 MG (50000 UT) CAPS capsule TAKE ONE CAPSULE BY MOUTH ONCE WEEKLY 05/14/18   Carollee Herter, Alferd Apa, DO  Vitamin D, Ergocalciferol, (DRISDOL) 1.25 MG (50000 UT) CAPS capsule TAKE 1 CAPSULE BY MOUTH ONCE A WEEK 10/25/18   Ann Held, DO    Family History Family History  Problem Relation Age of Onset   Breast cancer Mother    Arrhythmia Mother    Heart disease Mother    Cancer Mother 67       breast   Diabetes Mellitus II Mother    Hypertension Father    Kidney cancer Father    Colon polyps Father    Lymphoma Father        Diffused Large B-cell Lymphoma   Cancer Father        lymphoma   Cancer Maternal Aunt 58       breast cancer   Colon cancer Neg Hx     Social History Social History   Tobacco Use   Smoking status: Never Smoker   Smokeless tobacco: Never Used   Tobacco comment: Never Used Tobacco  Substance Use Topics   Alcohol use: No    Alcohol/week: 0.0 standard drinks   Drug  use: No     Allergies   Penicillins   Review of Systems As per HPI   Physical Exam Triage Vital Signs ED Triage Vitals  Enc Vitals Group     BP      Pulse      Resp      Temp      Temp src      SpO2      Weight      Height      Head Circumference      Peak Flow      Pain Score  Pain Loc      Pain Edu?      Excl. in [place]?    No data found.  Updated Vital Signs BP (!) 153/80 (BP Location: Left Arm)    Pulse (!) 57    Temp 98.2 F (36.8 C) (Oral)    Resp 18    LMP 01/20/2011    SpO2 100%   Visual Acuity Right Eye Distance:   Left Eye Distance:   Bilateral Distance:    Right Eye Near:   Left Eye Near:    Bilateral Near:     Physical Exam Constitutional:      General: She is not in acute distress. HENT:     Head: Normocephalic and atraumatic.  Eyes:     General: No scleral icterus.    Pupils: Pupils are equal, round, and reactive to light.  Cardiovascular:     Rate and Rhythm: Normal rate.  Pulmonary:     Effort: Pulmonary effort is normal.  Musculoskeletal:        General: Swelling and tenderness present. Normal range of motion.     Comments: Trace swelling over thenar eminence with exquisite TTP.  Negative snuffbox tenderness.  Full active ROM of breast without distal forearm TTP.  To move all fingers, the strength decreased as compared to left.  NVI  Skin:    Capillary Refill: Capillary refill takes less than 2 seconds.     Coloration: Skin is not jaundiced or pale.     Findings: No bruising.  Neurological:     Mental Status: She is alert and oriented to person, place, and time.      UC Treatments / Results  Labs (all labs ordered are listed, but only abnormal results are displayed) Labs Reviewed - No data to display  EKG   Radiology DG Hand Complete Right  Result Date: 09/01/2019 CLINICAL DATA:  [place].  Thenar eminence pain. EXAM: RIGHT HAND - COMPLETE 3+ VIEW COMPARISON:  None. FINDINGS: No evidence of fracture or dislocation. No  evidence of foreign object. IMPRESSION: Negative radiographs. Electronically Signed   By: Nelson Chimes M.D.   On: 09/01/2019 18:54    Procedures Procedures (including critical care time)  Medications Ordered in UC Medications - No data to display  Initial Impression / Assessment and Plan / UC Course  I have reviewed the triage vital signs and the nursing notes.  Pertinent labs & imaging results that were available during my care of the patient were reviewed by me and considered in my medical decision making (see chart for details).     Right hand x-ray done office, reviewed by me radiology: No evidence of fracture, dislocation, foreign body.  Reviewed findings with patient verbalized understanding.  Applied Ace wrap in office with patient tolerated well.  Will treat supportively as outlined below.  Return precautions discussed, patient verbalized understanding and is agreeable to plan. Final Clinical Impressions(s) / UC Diagnoses   Final diagnoses:  Right hand pain  Fall, initial encounter     Discharge Instructions     Recommend RICE: rest, ice, compression, elevation as needed for pain.   Cold therapy (ice packs) can be used to help swelling both after injury and after prolonged use of areas of chronic pain/aches.  For pain: recommend 350 mg-1000 mg of Tylenol (acetaminophen) and/or 200 mg - 800 mg of Advil (ibuprofen, Motrin) every 8 hours as needed.  May alternate between the two throughout the day as they are generally safe to take together.  DO NOT exceed more than 3000 mg of Tylenol or 3200 mg of ibuprofen in a 24 hour period as this could damage your stomach, kidneys, liver, or increase your bleeding risk.   Return for any pain, swelling, discoloration, numbness    ED Prescriptions    None     PDMP not reviewed this encounter.   Neldon Mc [place], [place] 09/01/19 1936

## 2019-09-01 NOTE — Discharge Instructions (Addendum)
Recommend RICE: rest, ice, compression, elevation as needed for pain.   Cold therapy (ice packs) can be used to help swelling both after injury and after prolonged use of areas of chronic pain/aches.  For pain: recommend 350 mg-1000 mg of Tylenol (acetaminophen) and/or 200 mg - 800 mg of Advil (ibuprofen, Motrin) every 8 hours as needed.  May alternate between the two throughout the day as they are generally safe to take together.  DO NOT exceed more than 3000 mg of Tylenol or 3200 mg of ibuprofen in a 24 hour period as this could damage your stomach, kidneys, liver, or increase your bleeding risk.   Return for any pain, swelling, discoloration, numbness

## 2019-09-05 ENCOUNTER — Ambulatory Visit: Payer: 59 | Admitting: Family Medicine

## 2019-09-21 ENCOUNTER — Ambulatory Visit (INDEPENDENT_AMBULATORY_CARE_PROVIDER_SITE_OTHER): Payer: 59 | Admitting: Family Medicine

## 2019-09-21 ENCOUNTER — Encounter: Payer: Self-pay | Admitting: Family Medicine

## 2019-09-21 ENCOUNTER — Other Ambulatory Visit: Payer: Self-pay

## 2019-09-21 VITALS — BP 130/90 | HR 57 | Temp 97.8°F | Resp 18 | Ht 63.5 in | Wt 241.8 lb

## 2019-09-21 DIAGNOSIS — M150 Primary generalized (osteo)arthritis: Secondary | ICD-10-CM | POA: Insufficient documentation

## 2019-09-21 DIAGNOSIS — M359 Systemic involvement of connective tissue, unspecified: Secondary | ICD-10-CM | POA: Diagnosis not present

## 2019-09-21 DIAGNOSIS — E039 Hypothyroidism, unspecified: Secondary | ICD-10-CM | POA: Diagnosis not present

## 2019-09-21 DIAGNOSIS — M25552 Pain in left hip: Secondary | ICD-10-CM

## 2019-09-21 DIAGNOSIS — R21 Rash and other nonspecific skin eruption: Secondary | ICD-10-CM | POA: Insufficient documentation

## 2019-09-21 DIAGNOSIS — I10 Essential (primary) hypertension: Secondary | ICD-10-CM

## 2019-09-21 DIAGNOSIS — R748 Abnormal levels of other serum enzymes: Secondary | ICD-10-CM | POA: Insufficient documentation

## 2019-09-21 LAB — ANA: ANA Direct: POSITIVE

## 2019-09-21 NOTE — Assessment & Plan Note (Signed)
Stable Cont meds 

## 2019-09-21 NOTE — Progress Notes (Signed)
Subjective:     Jenene Bartz is a 54 y.o. female presenting for Establish Care (previous PCP with [facility], GYN Dr [place]) and Growth on left hip (pain present. present for over a year)     HPI  #Left hip - felt a growth - started small - can feel it when she lays on the opposite side - seems to have increased in size - present x 1 year - started to be painful when she lays on that side - will get radiating down the side of her leg - which is prone to cramping - rheumatologist suggested checking with me - thought either bursitis or tumor  Had a fall 3 weeks ago - on her back and hand - went to urgent care  #connective tissue disease - seeing rheumatology - started on celebrex - checking for lupus but has not reach this point  #Hx of sleep apnea - using cpap occasionally - did not need after weight loss - does have issues with sleep and will take occasional melatonin - sleeps better with cpap  Gained to 250 and down 9 lbs today   Review of Systems   Social History   Tobacco Use  Smoking Status Never Smoker  Smokeless Tobacco Never Used  Tobacco Comment   Never Used Tobacco        Objective:    BP Readings from Last 3 Encounters:  09/21/19 130/90  09/01/19 (!) 153/80  06/07/19 (!) 173/84   Wt Readings from Last 3 Encounters:  09/21/19 241 lb 12 oz (109.7 kg)  04/26/18 245 lb (111.1 kg)  03/23/18 242 lb (109.8 kg)    BP 130/90   Pulse (!) 57   Temp 97.8 F (36.6 C)   Resp 18   Ht 5' 3.5" (1.613 m)   Wt 241 lb 12 oz (109.7 kg)   LMP 01/20/2011   SpO2 99%   BMI 42.15 kg/m    Physical Exam Constitutional:      General: She is not in acute distress.    Appearance: She is well-developed. She is not diaphoretic.  HENT:     Right Ear: External ear normal.     Left Ear: External ear normal.  Eyes:     Conjunctiva/sclera: Conjunctivae normal.  Cardiovascular:     Rate and Rhythm: Normal rate.  Pulmonary:      Effort: Pulmonary effort is normal.  Musculoskeletal:     Cervical back: Neck supple.     Comments: Left Hip Inspection: No masses, no abnormalities Palpation: TTP and some swelling on the lateral hip adjacent to the greater trochanter  ROM: normal Strength: decreased hip abduction b/l L>R  Skin:    General: Skin is warm and dry.     Capillary Refill: Capillary refill takes less than 2 seconds.     Comments: Right side mid back with hyperpigmented macular linear rash.   Neurological:     Mental Status: She is alert. Mental status is at baseline.  Psychiatric:        Mood and Affect: Mood normal.        Behavior: Behavior normal.           Assessment & Plan:   Problem List Items Addressed This Visit      Cardiovascular and Mediastinum   Essential hypertension - Primary    BP mildly elevated. Cont current medications. Reassess at next visit.         Endocrine   Hypothyroidism  Stable. Cont meds        Musculoskeletal and Integument   Rash    Failed several months of triamcinolone and etiology not entirely clear. Derm referral given prior treatment failure.       Relevant Orders   Ambulatory referral to Dermatology     Other   Connective tissue disease [facility])    Follows with Rheumatology. Recently started celebrex. Encouraged trial of Tumeric TID for pain as noting some worsening joint pain. Appreciate rheum support      Left hip pain    Rheum concerned about mass, but do not suspect mass as fullness changes with positions. Suspect bursitis vs weak glute muscles/inflammation. Advised trial of PT and if mass suspected will get imaging or return if not improving.       Relevant Orders   Ambulatory referral to Physical Therapy       Return in about 6 months (around 03/22/2020).  Lesleigh Noe, MD

## 2019-09-21 NOTE — Patient Instructions (Signed)
Great to meet you!  Try Tumeric 500 mg three times a day  Physical therapy for your hip pain  Dermatology for the rash -- would continue benadryl cream and triamcinolone for itch

## 2019-09-21 NOTE — Assessment & Plan Note (Signed)
Rheum concerned about mass, but do not suspect mass as fullness changes with positions. Suspect bursitis vs weak glute muscles/inflammation. Advised trial of PT and if mass suspected will get imaging or return if not improving.

## 2019-09-21 NOTE — Assessment & Plan Note (Signed)
Failed several months of triamcinolone and etiology not entirely clear. Derm referral given prior treatment failure.

## 2019-09-21 NOTE — Assessment & Plan Note (Signed)
BP mildly elevated. Cont current medications. Reassess at next visit.

## 2019-09-21 NOTE — Assessment & Plan Note (Signed)
Follows with Rheumatology. Recently started celebrex. Encouraged trial of Tumeric TID for pain as noting some worsening joint pain. Appreciate rheum support

## 2019-09-21 NOTE — Addendum Note (Signed)
Addended by: Waunita Schooner R on: 09/21/2019 01:17 PM   Modules accepted: Level of Service

## 2019-09-22 ENCOUNTER — Telehealth: Payer: Self-pay | Admitting: Family Medicine

## 2019-09-22 MED ORDER — POTASSIUM CHLORIDE CRYS ER 20 MEQ PO TBCR: 20.0000 meq | EXTENDED_RELEASE_TABLET | Freq: Every day | ORAL | 3 refills | Status: DC

## 2019-09-22 NOTE — Telephone Encounter (Signed)
Noted. Verified medication list. Pulled this down for review for refill for Dr Einar Pheasant.

## 2019-09-22 NOTE — Telephone Encounter (Signed)
Pt said her potassium dosage is Khlorcon m20.

## 2019-09-22 NOTE — Telephone Encounter (Signed)
Patient advised that RX was sent in 

## 2019-09-27 ENCOUNTER — Telehealth: Payer: Self-pay | Admitting: Family Medicine

## 2019-09-27 NOTE — Telephone Encounter (Signed)
Recv'd records from Dr. Bennie Pierini Cohen/[facility] forwarded 9 pages to Dr. Waunita Schooner 5/4/21fbg

## 2019-10-06 ENCOUNTER — Ambulatory Visit: Payer: 59 | Attending: Internal Medicine

## 2019-10-06 DIAGNOSIS — Z23 Encounter for immunization: Secondary | ICD-10-CM

## 2019-10-06 NOTE — Progress Notes (Signed)
   Covid-19 Vaccination Clinic  Name:  Aleyshka Cifuentes    MRN: [id number] DOB: May 28, 1965  10/06/2019  Ms. Demond was observed post Covid-19 immunization for 15 minutes without incident. She was provided with Vaccine Information Sheet and instruction to access the V-Safe system.   Ms. Brueck was instructed to call 911 with any severe reactions post vaccine: Marland Kitchen Difficulty breathing  . Swelling of face and throat  . A fast heartbeat  . A bad rash all over body  . Dizziness and weakness   Immunizations Administered    Name Date Dose VIS Date Route   Pfizer COVID-19 Vaccine 10/06/2019  9:58 AM 0.3 mL 07/20/2018 Intramuscular   Manufacturer: [place]   Lot: [id number]   [place]: [id number]

## 2019-10-11 ENCOUNTER — Other Ambulatory Visit: Payer: Self-pay | Admitting: Orthopedic Surgery

## 2019-10-11 ENCOUNTER — Ambulatory Visit
Admission: RE | Admit: 2019-10-11 | Discharge: 2019-10-11 | Disposition: A | Discharge status: Home / Self Care | Payer: 59 | Source: Ambulatory Visit | Attending: Orthopedic Surgery | Admitting: Orthopedic Surgery

## 2019-10-11 ENCOUNTER — Other Ambulatory Visit: Payer: Self-pay

## 2019-10-11 DIAGNOSIS — R2242 Localized swelling, mass and lump, left lower limb: Secondary | ICD-10-CM

## 2019-10-11 MED ORDER — GADOBENATE DIMEGLUMINE 529 MG/ML IV SOLN
20.0000 mL | Freq: Once | INTRAVENOUS | Status: AC | PRN
  Administered 2019-10-11: 20 mL via INTRAVENOUS

## 2019-10-31 ENCOUNTER — Ambulatory Visit: Payer: 59 | Attending: Internal Medicine

## 2019-10-31 DIAGNOSIS — Z23 Encounter for immunization: Secondary | ICD-10-CM

## 2019-10-31 NOTE — Progress Notes (Signed)
   Covid-19 Vaccination Clinic  Name:  [name]    MRN: [id number] DOB: November 19, 1965  10/31/2019  [name] was observed post Covid-19 immunization for 15 minutes without incident. She was provided with Vaccine Information Sheet and instruction to access the V-Safe system.   [name] was instructed to call 911 with any severe reactions post vaccine: Marland Kitchen Difficulty breathing  . Swelling of face and throat  . A fast heartbeat  . A bad rash all over body  . Dizziness and weakness   Immunizations Administered    Name Date Dose VIS Date Route   Pfizer COVID-19 Vaccine 10/31/2019  9:55 AM 0.3 mL 07/20/2018 Intramuscular   Manufacturer: [place]   Lot: [id number]   [place]: [id number]

## 2019-12-12 ENCOUNTER — Inpatient Hospital Stay: Payer: 59 | Attending: Family

## 2019-12-12 ENCOUNTER — Other Ambulatory Visit: Payer: Self-pay | Admitting: Family

## 2019-12-12 ENCOUNTER — Encounter: Payer: Self-pay | Admitting: Family

## 2019-12-12 ENCOUNTER — Other Ambulatory Visit: Payer: Self-pay

## 2019-12-12 ENCOUNTER — Inpatient Hospital Stay (HOSPITAL_BASED_OUTPATIENT_CLINIC_OR_DEPARTMENT_OTHER): Payer: 59 | Admitting: Family

## 2019-12-12 VITALS — BP 138/84 | HR 58 | Temp 98.2°F | Resp 17 | Ht 63.5 in | Wt 243.0 lb

## 2019-12-12 DIAGNOSIS — Z9884 Bariatric surgery status: Secondary | ICD-10-CM | POA: Diagnosis not present

## 2019-12-12 DIAGNOSIS — D508 Other iron deficiency anemias: Secondary | ICD-10-CM

## 2019-12-12 DIAGNOSIS — D509 Iron deficiency anemia, unspecified: Secondary | ICD-10-CM | POA: Insufficient documentation

## 2019-12-12 DIAGNOSIS — D473 Essential (hemorrhagic) thrombocythemia: Secondary | ICD-10-CM

## 2019-12-12 DIAGNOSIS — Z79899 Other long term (current) drug therapy: Secondary | ICD-10-CM | POA: Diagnosis not present

## 2019-12-12 DIAGNOSIS — D75839 Thrombocytosis, unspecified: Secondary | ICD-10-CM

## 2019-12-12 DIAGNOSIS — K909 Intestinal malabsorption, unspecified: Secondary | ICD-10-CM | POA: Insufficient documentation

## 2019-12-12 DIAGNOSIS — R5383 Other fatigue: Secondary | ICD-10-CM | POA: Insufficient documentation

## 2019-12-12 DIAGNOSIS — R7989 Other specified abnormal findings of blood chemistry: Secondary | ICD-10-CM | POA: Insufficient documentation

## 2019-12-12 DIAGNOSIS — R61 Generalized hyperhidrosis: Secondary | ICD-10-CM | POA: Insufficient documentation

## 2019-12-12 DIAGNOSIS — Z880 Allergy status to penicillin: Secondary | ICD-10-CM | POA: Insufficient documentation

## 2019-12-12 LAB — CBC WITH DIFFERENTIAL (CANCER CENTER ONLY)
Abs Immature Granulocytes: 0.02 10*3/uL (ref 0.00–0.07)
Basophils Absolute: 0.1 10*3/uL (ref 0.0–0.1)
Basophils Relative: 1 %
Eosinophils Absolute: 0.1 10*3/uL (ref 0.0–0.5)
Eosinophils Relative: 1 %
HCT: 40.5 % (ref 36.0–46.0)
Hemoglobin: 13 g/dL (ref 12.0–15.0)
Immature Granulocytes: 0 %
Lymphocytes Relative: 51 %
Lymphs Abs: 4.3 10*3/uL — ABNORMAL HIGH (ref 0.7–4.0)
MCH: 29.1 pg (ref 26.0–34.0)
MCHC: 32.1 g/dL (ref 30.0–36.0)
MCV: 90.6 fL (ref 80.0–100.0)
Monocytes Absolute: 0.6 10*3/uL (ref 0.1–1.0)
Monocytes Relative: 7 %
Neutro Abs: 3.4 10*3/uL (ref 1.7–7.7)
Neutrophils Relative %: 40 %
Platelet Count: 441 10*3/uL — ABNORMAL HIGH (ref 150–400)
RBC: 4.47 MIL/uL (ref 3.87–5.11)
RDW: 13.5 % (ref 11.5–15.5)
WBC Count: 8.4 10*3/uL (ref 4.0–10.5)
nRBC: 0 % (ref 0.0–0.2)

## 2019-12-12 LAB — CMP (CANCER CENTER ONLY)
ALT: 15 U/L (ref 0–44)
AST: 18 U/L (ref 15–41)
Albumin: 4.5 g/dL (ref 3.5–5.0)
Alkaline Phosphatase: 124 U/L (ref 38–126)
Anion gap: 7 (ref 5–15)
BUN: 16 mg/dL (ref 6–20)
CO2: 31 mmol/L (ref 22–32)
Calcium: 10 mg/dL (ref 8.9–10.3)
Chloride: 101 mmol/L (ref 98–111)
Creatinine: 0.63 mg/dL (ref 0.44–1.00)
GFR, Est AFR Am: >60 mL/min (ref >60)
GFR, Estimated: >60 mL/min (ref >60)
Glucose, Bld: 81 mg/dL (ref 70–99)
Potassium: 3.7 mmol/L (ref 3.5–5.1)
Sodium: 139 mmol/L (ref 135–145)
Total Bilirubin: 0.3 mg/dL (ref 0.3–1.2)
Total Protein: 7.4 g/dL (ref 6.5–8.1)

## 2019-12-12 LAB — LACTATE DEHYDROGENASE: LDH: 213 U/L — ABNORMAL HIGH (ref 98–192)

## 2019-12-12 NOTE — Progress Notes (Signed)
Hematology and Oncology Follow Up Visit  [name] [id number] [birth year] 54 y.o. 12/12/2019   Principle Diagnosis:  Thrombocytosis Family history of lymphoma (father) Iron deficiency anemia secondary to malabsorption with bariatric surgery(2012)  Current Therapy:        Observation IV iron as indicated   Interim History:  [name] is here today for follow-up. She is doing well but has noted fatigue.  She has had some bloating, loose and maroon colored stool over the last two weeks. She plans to call GI Juanita Craver today and notify.  She has not noted any other blood loss. No bruising or petechiae.  No fever, chills, n/v, cough, rash, dizziness, SOB, chest pain, palpitations, abdominal pain or changes in bowel or bladder habits.  She has occasional night sweats which she states has been a long standing issue.  She does wear a CPAP at night for sleep apnea.  No swelling, tenderness, numbness or tingling in her extremities at this time.  No falls or syncope.  She has maintained a good appetite and is staying well hydrated. Her weight is stable.   ECOG Performance Status: 1 - Symptomatic but completely ambulatory  Medications:  Allergies as of 12/12/2019      Reactions   Penicillins Other (See Comments)   Causes severe, difficult to treat yeast infections      Medication List       Accurate as of December 12, 2019 11:14 AM. If you have any questions, ask your nurse or doctor.        Biotin 10 MG Caps biotin   celecoxib 200 MG capsule Commonly known as: CELEBREX Take 1 capsule by mouth 2 (two) times daily.   cholecalciferol 25 MCG (1000 UNIT) tablet Commonly known as: VITAMIN D Take 1,000 Units by mouth daily.   Glucosamine 500 MG Caps Take 1 capsule by mouth daily.   hydrochlorothiazide 25 MG tablet Commonly known as: HYDRODIURIL TAKE 1 TABLET BY MOUTH EVERY DAY   ipratropium 0.03 % nasal spray Commonly known as: ATROVENT PLACE 1 SPRAY INTO BOTH  NOSTRILS 2 (TWO) TIMES DAILY.   levothyroxine 88 MCG tablet Commonly known as: SYNTHROID TAKE 1 TABLET BY MOUTH EVERY DAY   loratadine 10 MG tablet Commonly known as: CLARITIN TAKE 1 TABLET BY MOUTH EVERY DAY   losartan 100 MG tablet Commonly known as: COZAAR TAKE 1 TABLET BY MOUTH EVERY DAY   Magnesium 250 MG Tabs Take 1 tablet by mouth daily.   NON FORMULARY CPAP MACHINE AT NIGHT-not sure of the setting   omeprazole 40 MG capsule Commonly known as: PRILOSEC Take 1 capsule (40 mg total) by mouth daily.   potassium chloride SA 20 MEQ tablet Commonly known as: Klor-Con M20 Take 1 tablet (20 mEq total) by mouth daily.   Turmeric 500 MG Caps Take 500 mg by mouth daily. Take 500 mg by mouth daily.   VITAMIN B-12 PO Take 1 tablet by mouth daily.       Allergies:  Allergies  Allergen Reactions  . Penicillins Other (See Comments)    Causes severe, difficult to treat yeast infections    Past Medical History, Surgical history, Social history, and Family History were reviewed and updated.  Review of Systems: All other 10 point review of systems is negative.   Physical Exam:  height is 5' 3.5" (1.613 m) and weight is 243 lb (110.2 kg). Her oral temperature is 98.2 F (36.8 C). Her blood pressure is 138/84 and her pulse is 58 (  abnormal). Her respiration is 17 and oxygen saturation is 100%.   Wt Readings from Last 3 Encounters:  12/12/19 243 lb (110.2 kg)  09/21/19 241 lb 12 oz (109.7 kg)  04/26/18 245 lb (111.1 kg)    Ocular: Sclerae unicteric, pupils equal, round and reactive to light Ear-nose-throat: Oropharynx clear, dentition fair Lymphatic: No cervical or supraclavicular adenopathy Lungs no rales or rhonchi, good excursion bilaterally Heart regular rate and rhythm, no murmur appreciated Abd soft, nontender, positive bowel sounds, no liver or spleen tip palpated on exam, no fluid wave  MSK no focal spinal tenderness, no joint edema Neuro: non-focal,  well-oriented, appropriate affect Breasts: Deferred   Lab Results  Component Value Date   WBC 8.4 12/12/2019   HGB 13.0 12/12/2019   HCT 40.5 12/12/2019   MCV 90.6 12/12/2019   PLT 441 (H) 12/12/2019   Lab Results  Component Value Date   FERRITIN 40 04/26/2018   IRON 95 08/29/2019   TIBC 262 08/29/2019   UIBC 167 08/29/2019   IRONPCTSAT 36 08/29/2019   Lab Results  Component Value Date   RETICCTPCT 1.8 04/19/2014   RBC 4.47 12/12/2019   RETICCTABS 79.2 04/19/2014   No results found for: KPAFRELGTCHN, LAMBDASER, KAPLAMBRATIO No results found for: IGGSERUM, IGA, IGMSERUM No results found for: Kathrynn Ducking, MSPIKE, SPEI   Chemistry      Component Value Date/Time   NA 140 08/29/2019 0000   NA 138 04/27/2017 0933   K 3.9 08/29/2019 0000   K 3.3 (L) 04/27/2017 0933   CL 100 08/29/2019 0000   CO2 27 (A) 08/29/2019 0000   CO2 26 04/27/2017 0933   BUN 15 08/29/2019 0000   BUN 12.2 04/27/2017 0933   CREATININE 0.7 08/29/2019 0000   CREATININE 0.60 04/26/2018 0913   CREATININE 0.7 04/27/2017 0933   GLU 81 08/29/2019 0000      Component Value Date/Time   CALCIUM 9.5 08/29/2019 0000   CALCIUM 9.1 04/27/2017 0933   ALKPHOS 144 (A) 08/29/2019 0000   ALKPHOS 139 04/27/2017 0933   AST 21 08/29/2019 0000   AST 15 04/26/2018 0913   AST 25 04/27/2017 0933   ALT 16 08/29/2019 0000   ALT 11 04/26/2018 0913   ALT 19 04/27/2017 0933   BILITOT 0.3 04/26/2018 0913   BILITOT 0.58 04/27/2017 0933       Impression and Plan: [name] is a very pleasant 54 yo African American female with history of mild thrombocytosis and iron deficiency secondary to malabsorption after bariatric surgery in 2012. We will see what her iron studies show and bring her back in for infusion if needed.  We will plan to see her in another 6 months.  She will contact our office with any questions or concerns. We can certainly see her sooner if need be.    Laverna Peace, NP 7/19/202111:14 AM

## 2019-12-13 ENCOUNTER — Encounter: Payer: Self-pay | Admitting: Family

## 2019-12-13 ENCOUNTER — Telehealth: Payer: Self-pay | Admitting: Family

## 2019-12-13 LAB — IRON AND TIBC
Iron: 88 ug/dL (ref 41–142)
Saturation Ratios: 30 % (ref 21–57)
TIBC: 290 ug/dL (ref 236–444)
UIBC: 202 ug/dL (ref 120–384)

## 2019-12-13 LAB — FERRITIN: Ferritin: 78 ng/mL (ref 11–307)

## 2019-12-13 NOTE — Telephone Encounter (Signed)
Appointments scheduled calendar printed & mailed per 7/19 los

## 2019-12-14 ENCOUNTER — Ambulatory Visit: Payer: 59 | Admitting: Physician Assistant

## 2020-01-23 ENCOUNTER — Telehealth: Payer: Self-pay

## 2020-01-23 NOTE — Telephone Encounter (Signed)
[facility] - Client TELEPHONE ADVICE RECORD [facility] Patient Name: [name] Gender: Female DOB: Mar 31, 1966 Age: 54 Y 40 M 20 D Return Phone Number: [phone] (Primary), [phone] (Secondary) Address: [place] [address] Client [facility] - Client Client Site [facility] - Day Physician Waunita Schooner- MD Contact Type Call Who Is Calling Patient / Member / Family / Caregiver Call Type Triage / Clinical Relationship To Patient Self Return Phone Number [phone] (Secondary) Chief Complaint Diarrhea Reason for Call Symptomatic / Request for [name] states she has light headedness, dizziness, weakness, nausea, loose Bowels, and wondering what to do. Translation No Nurse Assessment Nurse: Vallery Sa, RN, Cathy Date/Time (Eastern Time): 01/23/2020 8:41:37 AM Confirm and document reason for call. If symptomatic, describe symptoms. ---Ivin Booty states she developed diarrhea again about 2 months ago (about 4 episodes in the past 24 hours). She thinks she has blood in her diarrhea. She last passed urine about 10 minutes ago. She developed increased weakness, dizziness and nausea two days ago. No fever, sore throat, cold or cough. Alert and responsive. Has the patient had close contact with a person known or suspected to have the novel coronavirus illness OR traveled / lives in area with major community spread (including international travel) in the last 14 days from the onset of symptoms? * If Asymptomatic, screen for exposure and travel within the last 14 days. ---No Does the patient have any new or worsening symptoms? ---Yes Will a triage be completed? ---Yes Related visit to physician within the last 2 weeks? ---No Does the PT have any chronic conditions? (i.e. diabetes, asthma, this includes High risk factors for pregnancy, etc.) ---Yes List  chronic conditions. ---Thyroid problems, High Platelets Is the patient pregnant or possibly pregnant? (Ask all females between the ages of 30-55) ---No Is this a behavioral health or substance abuse call? ---No Guidelines Guideline Title Affirmed Question Affirmed Notes Nurse Date/Time [name] Time) Rectal Bleeding MODERATE rectal bleeding (small blood Trumbull, RN, Tye [place] 01/23/2020 8:45:58 AM PLEASE NOTE: All timestamps contained within this report are represented as [place] Standard Time. CONFIDENTIALTY NOTICE: This fax transmission is intended only for the addressee. It contains information that is legally privileged, confidential or otherwise protected from use or disclosure. If you are not the intended recipient, you are strictly prohibited from reviewing, disclosing, copying using or disseminating any of this information or taking any action in reliance on or regarding this information. If you have received this fax in error, please notify us immediately by telephone so that we can arrange for its return to [place]. Phone: [phone], Toll-Free: [phone], Fax: [phone] Page: 2 of 2 Call Id: [id number] Guidelines Guideline Title Affirmed Question Affirmed Notes Nurse Date/Time [name] Time) clots, passing blood without stool, or toilet water turns red) Disp. Time [name] Time) Disposition Final User 01/23/2020 8:49:34 AM See PCP within 24 Hours Yes Trumbull, RN, Tye [place] Caller Disagree/Comply Comply Caller Understands Yes PreDisposition Call Doctor Care Advice Given Per Guideline SEE PCP WITHIN 24 HOURS: * IF OFFICE WILL BE OPEN: You need to be examined within the next 24 hours. Call your doctor (or NP/PA) when the office opens and make an appointment. BRING MEDICINES: * Please bring a list of your current medicines when you go to see the doctor. * It is also a good idea to bring the pill bottles too. This will help the doctor to make certain you are taking the right  medicines and  the right dose. CALL BACK IF: * Bleeding increases * You become worse. CARE ADVICE given per Rectal Bleeding (Adult) guideline. Comments User: Berton Mount, RN Date/Time [name] Time): 01/23/2020 8:50:34 AM Transferred to the office to check on appointment options. Referrals REFERRED TO PCP OFFICE Warm transfer to backline

## 2020-01-23 NOTE — Telephone Encounter (Signed)
Pt already has video visit scheduled with Dr Einar Pheasant on 01/24/20 at 2 PM.

## 2020-01-24 ENCOUNTER — Telehealth (INDEPENDENT_AMBULATORY_CARE_PROVIDER_SITE_OTHER): Payer: 59 | Admitting: Family Medicine

## 2020-01-24 ENCOUNTER — Other Ambulatory Visit: Payer: Self-pay

## 2020-01-24 VITALS — BP 145/76 | HR 64 | Temp 97.5°F | Ht 63.5 in | Wt 245.0 lb

## 2020-01-24 DIAGNOSIS — R42 Dizziness and giddiness: Secondary | ICD-10-CM

## 2020-01-24 DIAGNOSIS — R195 Other fecal abnormalities: Secondary | ICD-10-CM

## 2020-01-24 DIAGNOSIS — M7989 Other specified soft tissue disorders: Secondary | ICD-10-CM | POA: Insufficient documentation

## 2020-01-24 NOTE — Progress Notes (Signed)
I connected with [name] on 01/24/20 at  2:00 PM EDT by video and verified that I am speaking with the correct person using two identifiers.   I discussed the limitations, risks, security and privacy concerns of performing an evaluation and management service by video and the availability of in person appointments. I also discussed with the patient that there may be a patient responsible charge related to this service. The patient expressed understanding and agreed to proceed.  Patient location: Home Provider Location: [place] Participants: Lesleigh Noe and [name]   Subjective:     [name] is a 54 y.o. female presenting for Dizziness (x 4 days ), Nausea (x 4 days ), and Diarrhea ("stool and any gas has a chemical smell" x 3 months )     HPI   #Diarrhea/nausea - symptoms started 4 days ago - endorses some dizziness - Saturday - dizziness and diaphoresis episodes - nausea - loose bowel - 3 months ago - started noticing odor to her stool  - has been having irregular BM - 1-4 times daily - chemical smell to to the stool  - chemical smell - hematologist suggested GI referral  - husband and daughter thought she was pale  Now getting dizziness with laying down and sitting up Slow heart rate 48-53  Nausea/sweating improved - but occasionally getting nausea  3 BM today - sometimes dark brown (rust color) and sometimes normal color but black stools  Dizziness - lightheadedness and weakness  Intake - sometimes does not drink a lot, feels bloated today  Sick contact - no No loss of taste or smell   Leg swelling - localized to one area - area of soft indention per patient - entire leg is not swollen - no bite mark, no bruising,  - not itchy - no pain   Review of Systems  Constitutional: Positive for diaphoresis and fatigue. Negative for chills and fever.     Social History   Tobacco Use  Smoking Status Never Smoker   Smokeless Tobacco Never Used  Tobacco Comment   Never Used Tobacco        Objective:   BP Readings from Last 3 Encounters:  01/24/20 (!) 145/76  12/12/19 138/84  09/21/19 130/90   Wt Readings from Last 3 Encounters:  01/24/20 245 lb (111.1 kg)  12/12/19 243 lb (110.2 kg)  09/21/19 241 lb 12 oz (109.7 kg)   BP (!) 145/76   Pulse 64   Temp (!) 97.5 F (36.4 C) (Oral)   Ht 5' 3.5" (1.613 m)   Wt 245 lb (111.1 kg)   LMP 01/20/2011   SpO2 98%   BMI 42.72 kg/m    Physical Exam Constitutional:      Appearance: Normal appearance. She is not ill-appearing.  HENT:     Head: Normocephalic and atraumatic.     Right Ear: External ear normal.     Left Ear: External ear normal.  Eyes:     Conjunctiva/sclera: Conjunctivae normal.  Pulmonary:     Effort: Pulmonary effort is normal. No respiratory distress.  Musculoskeletal:     Comments: Right lateral calf with small area of swelling. No erythema noted through video  Neurological:     Mental Status: She is alert. Mental status is at baseline.  Psychiatric:        Mood and Affect: Mood normal.        Behavior: Behavior normal.  Thought Content: Thought content normal.        Judgment: Judgment normal.         Assessment & Plan:   Problem List Items Addressed This Visit      Other   Abnormal stools - Primary    3 month hx of abnormal stools. Lower suspicion for acute illness. Wonder about IBS. Referral to GI, but if delayed will consider some stool studies to evaluate and blood work. Hydration to help with dizziness      Relevant Orders   Ambulatory referral to Gastroenterology   Dizziness    Concerning for possible dehydration in setting of nausea and diarrhea. Advised increase fluid intake. Does have hx of bradycardia (reviewed results of monitoring). If worsening or persisting after good hydration and more present with bradycardia recommended cardiology f/u to consider reassessment.        Leg swelling      Not painful and no full leg swelling per patient. Difficult to assess over the phone. Possibly superficial blood clot. Strict return precautions discussed.           Return if symptoms worsen or fail to improve.  Lesleigh Noe, MD

## 2020-01-24 NOTE — Assessment & Plan Note (Signed)
Not painful and no full leg swelling per patient. Difficult to assess over the phone. Possibly superficial blood clot. Strict return precautions discussed.

## 2020-01-24 NOTE — Assessment & Plan Note (Signed)
Concerning for possible dehydration in setting of nausea and diarrhea. Advised increase fluid intake. Does have hx of bradycardia (reviewed results of monitoring). If worsening or persisting after good hydration and more present with bradycardia recommended cardiology f/u to consider reassessment.

## 2020-01-24 NOTE — Patient Instructions (Addendum)
GI referral  - for stool pattern - follow-up next week if too far away  Nausea/Dizziness - drink lots of water

## 2020-01-24 NOTE — Assessment & Plan Note (Signed)
3 month hx of abnormal stools. Lower suspicion for acute illness. Wonder about IBS. Referral to GI, but if delayed will consider some stool studies to evaluate and blood work. Hydration to help with dizziness

## 2020-01-25 NOTE — Telephone Encounter (Signed)
See encounter

## 2020-01-27 ENCOUNTER — Emergency Department (HOSPITAL_COMMUNITY)
Admission: EM | Admit: 2020-01-27 | Discharge: 2020-01-28 | Disposition: A | Discharge status: Home / Self Care | Payer: 59 | Attending: Emergency Medicine | Admitting: Emergency Medicine

## 2020-01-27 ENCOUNTER — Other Ambulatory Visit: Payer: Self-pay

## 2020-01-27 ENCOUNTER — Encounter (HOSPITAL_COMMUNITY): Payer: Self-pay | Admitting: *Deleted

## 2020-01-27 ENCOUNTER — Emergency Department (HOSPITAL_COMMUNITY): Payer: 59

## 2020-01-27 ENCOUNTER — Telehealth: Payer: Self-pay

## 2020-01-27 DIAGNOSIS — R0602 Shortness of breath: Secondary | ICD-10-CM | POA: Diagnosis not present

## 2020-01-27 DIAGNOSIS — Z5321 Procedure and treatment not carried out due to patient leaving prior to being seen by health care provider: Secondary | ICD-10-CM | POA: Insufficient documentation

## 2020-01-27 DIAGNOSIS — M79661 Pain in right lower leg: Secondary | ICD-10-CM | POA: Diagnosis not present

## 2020-01-27 DIAGNOSIS — R202 Paresthesia of skin: Secondary | ICD-10-CM | POA: Diagnosis not present

## 2020-01-27 DIAGNOSIS — R42 Dizziness and giddiness: Secondary | ICD-10-CM | POA: Insufficient documentation

## 2020-01-27 LAB — CBC
HCT: 41.8 % (ref 36.0–46.0)
Hemoglobin: 13.7 g/dL (ref 12.0–15.0)
MCH: 29.1 pg (ref 26.0–34.0)
MCHC: 32.8 g/dL (ref 30.0–36.0)
MCV: 88.7 fL (ref 80.0–100.0)
Platelets: 443 10*3/uL — ABNORMAL HIGH (ref 150–400)
RBC: 4.71 MIL/uL (ref 3.87–5.11)
RDW: 13.4 % (ref 11.5–15.5)
WBC: 9.2 10*3/uL (ref 4.0–10.5)
nRBC: 0 % (ref 0.0–0.2)

## 2020-01-27 LAB — BASIC METABOLIC PANEL
Anion gap: 14 (ref 5–15)
BUN: 12 mg/dL (ref 6–20)
CO2: 23 mmol/L (ref 22–32)
Calcium: 9.3 mg/dL (ref 8.9–10.3)
Chloride: 102 mmol/L (ref 98–111)
Creatinine, Ser: 0.69 mg/dL (ref 0.44–1.00)
GFR calc Af Amer: >60 mL/min (ref >60)
GFR calc non Af Amer: >60 mL/min (ref >60)
Glucose, Bld: 90 mg/dL (ref 70–99)
Potassium: 3.6 mmol/L (ref 3.5–5.1)
Sodium: 139 mmol/L (ref 135–145)

## 2020-01-27 LAB — TROPONIN I (HIGH SENSITIVITY): Troponin I (High Sensitivity): 2 ng/L (ref <18)

## 2020-01-27 NOTE — Telephone Encounter (Signed)
[place] [facility] - Client TELEPHONE ADVICE RECORD [facility]urse Patient Name: [name] Gender: Female DOB: 02-Apr-1966 Age: 54 Y 47 M 24 D Return Phone Number: [phone] (Primary), [phone] (Secondary) Address[place] [address] Client [place] [facility] - Client Client Site [place] - Day Physician Waunita Schooner- MD Contact Type Call Who Is Calling Patient / Member / Family / Caregiver Call Type Triage / Clinical Relationship To Patient Self Return Phone Number [phone] (Secondary) Chief Complaint Leg Swelling And Edema Reason for Call Symptomatic / Request for Health Information Initial Comment Transferred from office. PT has a possible blood clot, had a virtual visit 01/25/20 and her leg is hurting and pain seems to moved up on her leg now. PT also has a ringing in her ears and dizziness. [facility] Translation No Nurse Assessment Nurse: Ysidro Evert, RN, Levada Dy Date/Time Eilene Ghazi Time): 01/27/2020 8:22:10 AM Confirm and document reason for call. If symptomatic, describe symptoms. ---Caller states she is having pain in her right leg that started last night but it has been swollen since Tuesday. Has the patient had close contact with a person known or suspected to have the novel coronavirus illness OR traveled / lives in area with major community spread (including international travel) in the last 14 days from the onset of symptoms? * If Asymptomatic, screen for exposure and travel within the last 14 days. ---No Does the patient have any new or worsening symptoms? ---Yes Will a triage be completed? ---Yes Related visit to physician within the last 2 weeks? ---No Does the PT have any chronic conditions? (i.e. diabetes, asthma, this includes High risk factors for pregnancy, etc.) ---Yes List chronic conditions. ---thyroid, hypertension, connective  tissue disease Is the patient pregnant or possibly pregnant? (Ask all females between the ages of 52-55) ---No Is this a behavioral health or substance abuse call? ---No PLEASE NOTE: All timestamps contained within this report are represented as [place] Standard Time. CONFIDENTIALTY NOTICE: This fax transmission is intended only for the addressee. It contains information that is legally privileged, confidential or otherwise protected from use or disclosure. If you are not the intended recipient, you are strictly prohibited from reviewing, disclosing, copying using or disseminating any of this information or taking any action in reliance on or regarding this information. If you have received this fax in error, please notify us immediately by telephone so that we can arrange for its return to [place]. Phone: [phone], Toll-Free: [phone], Fax: [phone] Page: 2 of 2 Call Id: [id number] Guidelines Guideline Title Affirmed Question Affirmed Notes Nurse Date/Time Eilene Ghazi Time) Leg Swelling and Edema Difficulty breathing at rest Garland, RN, Levada Dy 01/27/2020 8:24:54 AM Disp. Time Eilene Ghazi Time) Disposition Final User 01/27/2020 8:26:21 AM Go to ED Now Yes Ysidro Evert, RN, Marin Shutter Disagree/Comply Comply Caller Understands Yes PreDisposition Did not know what to do Care Advice Given Per Guideline GO TO ED NOW: * You need to be seen in the Emergency Department. * Go to the ED at ___________ [place] now. Drive carefully. * Another adult should drive. BRING MEDICINES: * Please bring a list of your current medicines when you go to the Emergency Department (ER). CARE ADVICE given per Leg Swelling and Edema (Adult) guideline. Referrals GO TO FACILITY OTHER - SPECIFY

## 2020-01-27 NOTE — Telephone Encounter (Signed)
Pt called back stating she has been @ the ER since 8:58 this morning.  She wanted to know if she could go to urgent care.  I spoke with Rena and she stated if pt went to urgent care and they thought she had blood clot they would send her back to er.  She stated the er was the best option for pt.  Pt is aware of rena's comment and stated she would stay.

## 2020-01-27 NOTE — Telephone Encounter (Signed)
Per chart review tab pt went to [name] ED. Sending to Dr Einar Pheasant who is out of office today and Glenda Chroman FNP.

## 2020-01-27 NOTE — Telephone Encounter (Signed)
Noted  

## 2020-01-27 NOTE — ED Notes (Signed)
Pt states she is leaving due to wait time  

## 2020-01-27 NOTE — Telephone Encounter (Signed)
Noted, agree with ER evaluation

## 2020-01-27 NOTE — ED Triage Notes (Signed)
Pt reports dizziness x 1 week. Has been to pcp and was diagnosed with superficial blood clot in right lower leg. Now having numbness sensation to her leg and mild sob. No acute distress is noted at triage.

## 2020-01-29 ENCOUNTER — Encounter: Payer: Self-pay | Admitting: Family Medicine

## 2020-01-29 DIAGNOSIS — M7989 Other specified soft tissue disorders: Secondary | ICD-10-CM

## 2020-01-31 ENCOUNTER — Ambulatory Visit
Admission: RE | Admit: 2020-01-31 | Discharge: 2020-01-31 | Disposition: A | Discharge status: Home / Self Care | Payer: 59 | Source: Ambulatory Visit | Attending: Family Medicine | Admitting: Family Medicine

## 2020-01-31 ENCOUNTER — Other Ambulatory Visit: Payer: Self-pay

## 2020-01-31 DIAGNOSIS — M7989 Other specified soft tissue disorders: Secondary | ICD-10-CM | POA: Diagnosis not present

## 2020-01-31 NOTE — Telephone Encounter (Signed)
[place] [facility] - Client TELEPHONE ADVICE RECORD [facility] Patient Name: [name] Gender: Female DOB: [birth year] Age: 54 Y 85 M 24 D Return Phone Number: [phone] (Primary) Address: City/State/Zip: [place] Client [place] [facility] - Client Client Site [place] Physician Waunita Schooner- MD Contact Type Call Who Is Calling Patient / Member / Family / Caregiver Call Type Triage / Clinical Relationship To Patient Self Return Phone Number [phone] (Primary) Chief Complaint Numbness Reason for Call Symptomatic / Request for [place] states she was instructed to go to ER regarding a possible blood clot this morning. She has not been seen since this morning. She wants to know what to do She wants to know is there is an radiology she can go to?[facility] She is still light headed, right calf numbness, pain. She has been in ER for 9 hours no tx Translation No Nurse Assessment Nurse: Zara Council, RN, Joelene Millin Date/Time (Eastern Time): 01/27/2020 6:42:41 PM Confirm and document reason for call. If symptomatic, describe symptoms. ---Caller states she was instructed to go to ED this morning by her PCP regarding a possible blood clot. States she has been at the [facility] ED for 9 hours. She has had a chest xray, EKG, bloodwork. She is still in the waiting room. States she wants the on call provider paged. States symptoms are the same. Denies new or worse symptoms. Has the patient had close contact with a person known or suspected to have the novel coronavirus illness OR traveled / lives in area with major community spread (including international travel) in the last 14 days from the onset of symptoms? * If Asymptomatic, screen for exposure and travel within the last 14 days. ---No Does the patient have any new or worsening symptoms? ---No Disp. Time Eilene Ghazi Time) Disposition Final  User 01/27/2020 6:47:18 PM Paged On Call back to [facility], RNJoelene Millin 01/27/2020 7:05:42 PM Clinical Call Yes Cabe, RN, Joelene Millin Comments User: Hinton Dyer, RN Date/Time Eilene Ghazi Time): 01/27/2020 7:05:34 PM PLEASE NOTE: All timestamps contained within this report are represented as [place] Standard Time. CONFIDENTIALTY NOTICE: This fax transmission is intended only for the addressee. It contains information that is legally privileged, confidential or otherwise protected from use or disclosure. If you are not the intended recipient, you are strictly prohibited from reviewing, disclosing, copying using or disseminating any of this information or taking any action in reliance on or regarding this information. If you have received this fax in error, please notify us immediately by telephone so that we can arrange for its return to [place]. Phone: [phone], Toll-Free: [phone], Fax: [phone] Page: 2 of 2 Call Id: [id number] Comments Called pt back and told her what on call provider said, verbalized understanding. Paging DoctorName Phone DateTime Result/Outcome Message Type Notes Alysia Penna - MD [phone] 01/27/2020 6:22:29 PM Paged On Call Back to Call Center Doctor Paged Please call [name], RN, [facility] Access Nurse, [phone]. Thank you. Alysia Penna - MD 01/27/2020 7:05:13 PM Spoke with On Call - General Message Result Dr. Sarajane Jews states this RN should tell the pt to stay at the ED, that she is in good hands and to report any new or worsening symptoms to staff.

## 2020-01-31 NOTE — Telephone Encounter (Signed)
Per this note Dr Einar Pheasant has sent pt message.

## 2020-01-31 NOTE — Telephone Encounter (Signed)
Pt left without being seen. Sent [employer]. Urgent US ordered today

## 2020-02-07 ENCOUNTER — Encounter: Payer: Self-pay | Admitting: Family Medicine

## 2020-02-07 ENCOUNTER — Ambulatory Visit (INDEPENDENT_AMBULATORY_CARE_PROVIDER_SITE_OTHER): Payer: 59 | Admitting: Family Medicine

## 2020-02-07 ENCOUNTER — Other Ambulatory Visit: Payer: Self-pay

## 2020-02-07 VITALS — BP 130/82 | HR 53 | Temp 97.3°F | Ht 64.0 in | Wt 246.8 lb

## 2020-02-07 DIAGNOSIS — R42 Dizziness and giddiness: Secondary | ICD-10-CM | POA: Diagnosis not present

## 2020-02-07 DIAGNOSIS — E039 Hypothyroidism, unspecified: Secondary | ICD-10-CM | POA: Diagnosis not present

## 2020-02-07 DIAGNOSIS — E782 Mixed hyperlipidemia: Secondary | ICD-10-CM | POA: Diagnosis not present

## 2020-02-07 LAB — LIPID PANEL
Cholesterol: 202 mg/dL — ABNORMAL HIGH (ref 0–200)
HDL: 57.3 mg/dL (ref >39.00)
LDL Cholesterol: 130 mg/dL — ABNORMAL HIGH (ref 0–99)
NonHDL: 144.74
Total CHOL/HDL Ratio: 4
Triglycerides: 74 mg/dL (ref 0.0–149.0)
VLDL: 14.8 mg/dL (ref 0.0–40.0)

## 2020-02-07 LAB — TSH: TSH: 1.95 u[IU]/mL (ref 0.35–4.50)

## 2020-02-07 NOTE — Progress Notes (Signed)
Subjective:     [name] is a 54 y.o. female presenting for Follow-up (dizziness x 2 weeks )     HPI   #Diarrhea - saw the GI provider - Dr. Collene Mares - is starting a probiotic to see if that will help - continues to have diarrhea every once in a while  #Dizziness - tinnitus - felt like she could feel the liquid move from one side - worse with bending over and picking something up - feels like something is going back and forth in the head - feels more like a room spinning - the first and worse episode was associated with nausea and last for 1 day before it started to improve - feels like her HR makes her short of breath - but low HR does not seem to coorelate to the dizziness   Review of Systems  01/27/2020: ER - dizziness x 1 week and leg swelling - did not see provider - EKG/CXR/labs wnl.  01/24/2020: Virtual visit - dizziness in setting of diarrhea/nausea and hx of bradycardia. Leg swelling  - DVT US normalDi  Social History   Tobacco Use  Smoking Status Never Smoker  Smokeless Tobacco Never Used  Tobacco Comment   Never Used Tobacco        Objective:    BP Readings from Last 3 Encounters:  02/07/20 130/82  01/27/20 126/67  01/24/20 (!) 145/76   Wt Readings from Last 3 Encounters:  02/07/20 246 lb 12 oz (111.9 kg)  01/27/20 245 lb (111.1 kg)  01/24/20 245 lb (111.1 kg)    BP 130/82   Pulse (!) 53   Temp (!) 97.3 F (36.3 C) (Temporal)   Ht 5\' 4"  (1.626 m)   Wt 246 lb 12 oz (111.9 kg)   LMP 01/20/2011   SpO2 99%   BMI 42.35 kg/m    Physical Exam Constitutional:      General: She is not in acute distress.    Appearance: She is well-developed. She is not diaphoretic.  HENT:     Right Ear: External ear normal.     Left Ear: External ear normal.     Nose: Nose normal.  Eyes:     General: No scleral icterus.    Extraocular Movements: Extraocular movements intact.     Conjunctiva/sclera: Conjunctivae normal.     Pupils: Pupils are  equal, round, and reactive to light.     Comments: No nystagmus  Cardiovascular:     Rate and Rhythm: Regular rhythm. Bradycardia present.     Heart sounds: No murmur heard.   Pulmonary:     Effort: Pulmonary effort is normal. No respiratory distress.     Breath sounds: Normal breath sounds. No wheezing.  Musculoskeletal:     Cervical back: Neck supple.     Comments: Fullness and TTP along the lateral right calf - no discrete mass or lesion  Skin:    General: Skin is warm and dry.     Capillary Refill: Capillary refill takes less than 2 seconds.  Neurological:     Mental Status: She is alert. Mental status is at baseline.  Psychiatric:        Mood and Affect: Mood normal.        Behavior: Behavior normal.           Assessment & Plan:   Problem List Items Addressed This Visit      Endocrine   Hypothyroidism    Feels stable. Cont levothyroxine. Repeat TSH  as due      Relevant Orders   TSH (Completed)     Other   Dizziness - Primary    Difficult history. Seems to endorse some elements of orthostatic/postural though VS wnl as well as vertigo symptoms with turning over and bed and head/tinnitus sensation. Hx of bradycardia which could be contributing to the dizziness, however, with some vertigo symptoms will refer to ENT to further evaluate. No nystagmus on exam. Discuss some orthostatic preventative treatments. Pending ENT will plan to have cardiology reassess bradycardia if symptoms do not improve.       Relevant Orders   Ambulatory referral to ENT    Other Visit Diagnoses    Mixed hyperlipidemia       Relevant Orders   Lipid panel (Completed)       Return if symptoms worsen or fail to improve.  Lesleigh Noe, MD  This visit occurred during the SARS-CoV-2 public health emergency.  Safety protocols were in place, including screening questions prior to the visit, additional usage of staff PPE, and extensive cleaning of exam room while observing appropriate  contact time as indicated for disinfecting solutions.

## 2020-02-07 NOTE — Assessment & Plan Note (Signed)
Difficult history. Seems to endorse some elements of orthostatic/postural though VS wnl as well as vertigo symptoms with turning over and bed and head/tinnitus sensation. Hx of bradycardia which could be contributing to the dizziness, however, with some vertigo symptoms will refer to ENT to further evaluate. No nystagmus on exam. Discuss some orthostatic preventative treatments. Pending ENT will plan to have cardiology reassess bradycardia if symptoms do not improve.

## 2020-02-07 NOTE — Assessment & Plan Note (Signed)
Feels stable. Cont levothyroxine. Repeat TSH as due

## 2020-02-07 NOTE — Patient Instructions (Signed)
Dizziness - continue with slow transitions - continue to hydrated  - Ear Nose and Throat specialists  #Referral I have placed a referral to a specialist for you. You should receive a phone call from the specialty office. Make sure your voicemail is not full and that if you are able to answer your phone to unknown or new numbers.   It may take up to 2 weeks to hear about the referral. If you do not hear anything in 2 weeks, please call our office and ask to speak with the referral coordinator.   Planning for cardiology if symptoms do not improve

## 2020-02-08 ENCOUNTER — Other Ambulatory Visit: Payer: Self-pay | Admitting: Family Medicine

## 2020-02-08 DIAGNOSIS — E039 Hypothyroidism, unspecified: Secondary | ICD-10-CM

## 2020-02-08 MED ORDER — LEVOTHYROXINE SODIUM 88 MCG PO TABS: 88.0000 ug | ORAL_TABLET | Freq: Every day | ORAL | 3 refills | Status: DC

## 2020-03-22 ENCOUNTER — Ambulatory Visit: Payer: 59 | Admitting: Family Medicine

## 2020-04-25 ENCOUNTER — Encounter: Payer: Self-pay | Admitting: Family Medicine

## 2020-04-30 MED ORDER — HYDROCHLOROTHIAZIDE 25 MG PO TABS
25.0000 mg | ORAL_TABLET | Freq: Every day | ORAL | 3 refills | Status: DC
Start: 2020-04-30 — End: 2020-08-06

## 2020-05-23 ENCOUNTER — Telehealth: Payer: Self-pay

## 2020-05-23 NOTE — Telephone Encounter (Signed)
Spoke with pt and she stated that she is taking her med every morning but she noticed that the #'s are still running a little high between 138/140 and the lows in the 80  Thank you,  [name]

## 2020-05-23 NOTE — Telephone Encounter (Signed)
Message sent to pt thru MyChart 

## 2020-05-23 NOTE — Telephone Encounter (Signed)
Would recommend continuing current medication. If still borderline high in March we can discuss options at our visit.

## 2020-06-04 ENCOUNTER — Ambulatory Visit: Payer: 59 | Admitting: Physician Assistant

## 2020-06-13 ENCOUNTER — Encounter: Payer: Self-pay | Admitting: Family

## 2020-06-13 ENCOUNTER — Other Ambulatory Visit: Payer: Self-pay

## 2020-06-13 ENCOUNTER — Inpatient Hospital Stay: Payer: 59 | Attending: Family

## 2020-06-13 ENCOUNTER — Telehealth: Payer: Self-pay

## 2020-06-13 ENCOUNTER — Inpatient Hospital Stay (HOSPITAL_BASED_OUTPATIENT_CLINIC_OR_DEPARTMENT_OTHER): Payer: 59 | Admitting: Family

## 2020-06-13 VITALS — BP 136/64 | HR 56 | Temp 98.1°F | Resp 18 | Ht 64.0 in | Wt 245.1 lb

## 2020-06-13 DIAGNOSIS — D508 Other iron deficiency anemias: Secondary | ICD-10-CM

## 2020-06-13 DIAGNOSIS — D75839 Thrombocytosis, unspecified: Secondary | ICD-10-CM

## 2020-06-13 DIAGNOSIS — K909 Intestinal malabsorption, unspecified: Secondary | ICD-10-CM | POA: Insufficient documentation

## 2020-06-13 DIAGNOSIS — R5383 Other fatigue: Secondary | ICD-10-CM | POA: Diagnosis not present

## 2020-06-13 DIAGNOSIS — D509 Iron deficiency anemia, unspecified: Secondary | ICD-10-CM | POA: Insufficient documentation

## 2020-06-13 LAB — CMP (CANCER CENTER ONLY)
ALT: 19 U/L (ref 0–44)
AST: 21 U/L (ref 15–41)
Albumin: 4.3 g/dL (ref 3.5–5.0)
Alkaline Phosphatase: 131 U/L — ABNORMAL HIGH (ref 38–126)
Anion gap: 8 (ref 5–15)
BUN: 12 mg/dL (ref 6–20)
CO2: 29 mmol/L (ref 22–32)
Calcium: 9.5 mg/dL (ref 8.9–10.3)
Chloride: 101 mmol/L (ref 98–111)
Creatinine: 0.61 mg/dL (ref 0.44–1.00)
GFR, Estimated: >60 mL/min (ref >60)
Glucose, Bld: 97 mg/dL (ref 70–99)
Potassium: 3.8 mmol/L (ref 3.5–5.1)
Sodium: 138 mmol/L (ref 135–145)
Total Bilirubin: 0.4 mg/dL (ref 0.3–1.2)
Total Protein: 7.2 g/dL (ref 6.5–8.1)

## 2020-06-13 LAB — CBC WITH DIFFERENTIAL (CANCER CENTER ONLY)
Abs Immature Granulocytes: 0.04 10*3/uL (ref 0.00–0.07)
Basophils Absolute: 0.1 10*3/uL (ref 0.0–0.1)
Basophils Relative: 1 %
Eosinophils Absolute: 0.1 10*3/uL (ref 0.0–0.5)
Eosinophils Relative: 1 %
HCT: 41.2 % (ref 36.0–46.0)
Hemoglobin: 13.6 g/dL (ref 12.0–15.0)
Immature Granulocytes: 1 %
Lymphocytes Relative: 47 %
Lymphs Abs: 3.9 10*3/uL (ref 0.7–4.0)
MCH: 29.4 pg (ref 26.0–34.0)
MCHC: 33 g/dL (ref 30.0–36.0)
MCV: 89 fL (ref 80.0–100.0)
Monocytes Absolute: 0.4 10*3/uL (ref 0.1–1.0)
Monocytes Relative: 5 %
Neutro Abs: 3.7 10*3/uL (ref 1.7–7.7)
Neutrophils Relative %: 45 %
Platelet Count: 430 10*3/uL — ABNORMAL HIGH (ref 150–400)
RBC: 4.63 MIL/uL (ref 3.87–5.11)
RDW: 13.5 % (ref 11.5–15.5)
WBC Count: 8.2 10*3/uL (ref 4.0–10.5)
nRBC: 0 % (ref 0.0–0.2)

## 2020-06-13 LAB — IRON AND TIBC
Iron: 95 ug/dL (ref 28–170)
Saturation Ratios: 27 % (ref 10.4–31.8)
TIBC: 352 ug/dL (ref 250–450)
UIBC: 257 ug/dL

## 2020-06-13 LAB — LACTATE DEHYDROGENASE: LDH: 189 U/L (ref 98–192)

## 2020-06-13 LAB — FERRITIN: Ferritin: 42 ng/mL (ref 11–307)

## 2020-06-13 NOTE — Progress Notes (Signed)
Hematology and Oncology Follow Up Visit  [name] [id number] 18-Jan-1966 55 y.o. 06/13/2020   Principle Diagnosis:  Thrombocytosis Family history of lymphoma (father) Iron deficiency anemia secondary to malabsorption with bariatric surgery(2012)  Current Therapy: Observation IV iron as indicated   Interim History:  [name] is here today for follow-up. She is doing well but notes fatigue at times.  She has generalized aches and pains and continues to follow-up with Rheumatology every 6  Months for mixed connective tissue disease.  She states that she has noted intermittent numbness and tingling in her hands as well as pain in the whole right arm off and on for the last 3-4 months. She feels that this is due to chronic neck issues and has an appointment for follow-up with her spinal surgeon for further eval.  No swelling, tenderness, numbness or tingling in her extremities at this time.  No falls or syncope to report.  No fever, chills, n/v, cough, rash, dizziness, SOB, chest pain, palpitations, abdominal pain or changes in bowel or bladder habits.  She started taking a probiotic daily after seeing GI and has not had any issues with blood loss.  No abnormal bruising, no petechiae. Platelets are stable at 430.  She has maintained a good appetite and is making an effort to stay well hydrated throughout the day.  She stays quite busy keeping her sweet grand babies throughout the week.   ECOG Performance Status: 1 - Symptomatic but completely ambulatory  Medications:  Allergies as of 06/13/2020      Reactions   Penicillins Other (See Comments)   Causes severe, difficult to treat yeast infections      Medication List       Accurate as of June 13, 2020 10:16 AM. If you have any questions, ask your nurse or doctor.        Biotin 10 MG Caps every other day.   celecoxib 200 MG capsule Commonly known as: CELEBREX Take 1 capsule by mouth 2 (two) times  daily.   cholecalciferol 25 MCG (1000 UNIT) tablet Commonly known as: VITAMIN D Take 1,000 Units by mouth daily.   Glucosamine 500 MG Caps Take 1 capsule by mouth daily.   hydrochlorothiazide 25 MG tablet Commonly known as: HYDRODIURIL Take 1 tablet (25 mg total) by mouth daily.   ipratropium 0.03 % nasal spray Commonly known as: ATROVENT PLACE 1 SPRAY INTO BOTH NOSTRILS 2 (TWO) TIMES DAILY. What changed:   when to take this  reasons to take this   levothyroxine 88 MCG tablet Commonly known as: SYNTHROID Take 1 tablet (88 mcg total) by mouth daily.   loratadine 10 MG tablet Commonly known as: CLARITIN TAKE 1 TABLET BY MOUTH EVERY DAY What changed:   when to take this  reasons to take this   losartan 100 MG tablet Commonly known as: COZAAR TAKE 1 TABLET BY MOUTH EVERY DAY   Magnesium 250 MG Tabs Take 1 tablet by mouth as needed. For leg cramps   NON FORMULARY CPAP MACHINE AT NIGHT-not sure of the setting   omeprazole 40 MG capsule Commonly known as: PRILOSEC Take 1 capsule (40 mg total) by mouth daily. What changed:   when to take this  reasons to take this   potassium chloride SA 20 MEQ tablet Commonly known as: Klor-Con M20 Take 1 tablet (20 mEq total) by mouth daily.   triamcinolone 0.025 % ointment Commonly known as: KENALOG triamcinolone acetonide 0.025 % topical ointment  APPLY TO AFFECTED AREA  TWICE DAILY FOR 2 WEEKS. IF IMPROVEMENT CAN CONTINUE DAILY.   Turmeric 500 MG Caps Take 500 mg by mouth daily. Take 500 mg by mouth daily.   VITAMIN B-12 PO Take 1 tablet by mouth every other day.       Allergies:  Allergies  Allergen Reactions  . Penicillins Other (See Comments)    Causes severe, difficult to treat yeast infections    Past Medical History, Surgical history, Social history, and Family History were reviewed and updated.  Review of Systems: All other 10 point review of systems is negative.   Physical Exam:  vitals were not  taken for this visit.   Wt Readings from Last 3 Encounters:  02/07/20 246 lb 12 oz (111.9 kg)  01/27/20 245 lb (111.1 kg)  01/24/20 245 lb (111.1 kg)    Ocular: Sclerae unicteric, pupils equal, round and reactive to light Ear-nose-throat: Oropharynx clear, dentition fair Lymphatic: No cervical or supraclavicular adenopathy Lungs no rales or rhonchi, good excursion bilaterally Heart regular rate and rhythm, no murmur appreciated Abd soft, nontender, positive bowel sounds MSK no focal spinal tenderness, no joint edema Neuro: non-focal, well-oriented, appropriate affect Breasts: Deferred   Lab Results  Component Value Date   WBC 9.2 01/27/2020   HGB 13.7 01/27/2020   HCT 41.8 01/27/2020   MCV 88.7 01/27/2020   PLT 443 (H) 01/27/2020   Lab Results  Component Value Date   FERRITIN 78 12/12/2019   IRON 88 12/12/2019   TIBC 290 12/12/2019   UIBC 202 12/12/2019   IRONPCTSAT 30 12/12/2019   Lab Results  Component Value Date   RETICCTPCT 1.8 04/19/2014   RBC 4.71 01/27/2020   RETICCTABS 79.2 04/19/2014   No results found for: KPAFRELGTCHN, LAMBDASER, KAPLAMBRATIO No results found for: Kandis Cocking, IGMSERUM No results found for: Odetta Pink, SPEI   Chemistry      Component Value Date/Time   NA 139 01/27/2020 0923   NA 140 08/29/2019 0000   NA 138 04/27/2017 0933   K 3.6 01/27/2020 0923   K 3.3 (L) 04/27/2017 0933   CL 102 01/27/2020 0923   CO2 23 01/27/2020 0923   CO2 26 04/27/2017 0933   BUN 12 01/27/2020 0923   BUN 15 08/29/2019 0000   BUN 12.2 04/27/2017 0933   CREATININE 0.69 01/27/2020 0923   CREATININE 0.63 12/12/2019 1036   CREATININE 0.7 04/27/2017 0933   GLU 81 08/29/2019 0000      Component Value Date/Time   CALCIUM 9.3 01/27/2020 0923   CALCIUM 9.1 04/27/2017 0933   ALKPHOS 124 12/12/2019 1036   ALKPHOS 139 04/27/2017 0933   AST 18 12/12/2019 1036   AST 25 04/27/2017 0933   ALT 15 12/12/2019  1036   ALT 19 04/27/2017 0933   BILITOT 0.3 12/12/2019 1036   BILITOT 0.58 04/27/2017 0933       Impression and Plan: [name] is a very pleasant 55 yo African American female with history of mild thrombocytosis and iron deficiency secondary to malabsorption after bariatric surgery in 2012. We will see what her iron studies look like and replace if needed.  We will see her again in 6 months.  She can contact our office with any questions or concerns.   Laverna Peace, NP 1/19/202210:16 AM

## 2020-06-13 NOTE — Telephone Encounter (Signed)
appts made per 1-19 los, pt to rec sch in office and my chart   Webb Silversmith

## 2020-06-15 DIAGNOSIS — U071 COVID-19: Secondary | ICD-10-CM

## 2020-06-15 HISTORY — DX: COVID-19: U07.1

## 2020-07-23 ENCOUNTER — Other Ambulatory Visit: Payer: Self-pay | Admitting: Family Medicine

## 2020-07-28 ENCOUNTER — Other Ambulatory Visit: Payer: Self-pay | Admitting: Family Medicine

## 2020-07-31 ENCOUNTER — Encounter: Payer: Self-pay | Admitting: Family Medicine

## 2020-08-06 ENCOUNTER — Other Ambulatory Visit: Payer: Self-pay

## 2020-08-06 ENCOUNTER — Encounter: Payer: Self-pay | Admitting: Family Medicine

## 2020-08-06 ENCOUNTER — Ambulatory Visit (INDEPENDENT_AMBULATORY_CARE_PROVIDER_SITE_OTHER): Payer: 59 | Admitting: Family Medicine

## 2020-08-06 VITALS — BP 142/82 | HR 54 | Temp 97.6°F | Ht 64.0 in | Wt 248.5 lb

## 2020-08-06 DIAGNOSIS — M79601 Pain in right arm: Secondary | ICD-10-CM

## 2020-08-06 DIAGNOSIS — I10 Essential (primary) hypertension: Secondary | ICD-10-CM

## 2020-08-06 DIAGNOSIS — J0140 Acute pansinusitis, unspecified: Secondary | ICD-10-CM

## 2020-08-06 MED ORDER — AMOXICILLIN-POT CLAVULANATE 875-125 MG PO TABS: 1.0000 | ORAL_TABLET | Freq: Two times a day (BID) | ORAL | 0 refills | Status: AC

## 2020-08-06 MED ORDER — HYDROCHLOROTHIAZIDE 25 MG PO TABS: 25.0000 mg | ORAL_TABLET | Freq: Every day | ORAL | 3 refills | Status: DC

## 2020-08-06 MED ORDER — FLUCONAZOLE 150 MG PO TABS: 150.0000 mg | ORAL_TABLET | ORAL | 0 refills | Status: AC

## 2020-08-06 NOTE — Assessment & Plan Note (Signed)
Suspect this is muscle tension/trigger point with radiating symptoms vs some muscle tension near the brachial plexus. Pt cannot afford PT but will do some home stretches reassurance provided.

## 2020-08-06 NOTE — Patient Instructions (Signed)
#  Referral I have placed a referral to a specialist for you. You should receive a phone call from the specialty office. Make sure your voicemail is not full and that if you are able to answer your phone to unknown or new numbers.   It may take up to 2 weeks to hear about the referral. If you do not hear anything in 2 weeks, please call our office and ask to speak with the referral coordinator.    Sinus infection - treat with antibiotics for 5-7 days  Arm pain  - suspect this is muscle tension - consider physical therapy - do some arm stretches to help

## 2020-08-06 NOTE — Assessment & Plan Note (Signed)
S/p bariatric surgery but with recent weight gain. Discussed diet and importance of eating enough calories. Nutrition referral. If too expensive discussed healthy weight loss center as another option.

## 2020-08-06 NOTE — Progress Notes (Signed)
Subjective:     [name] is a 55 y.o. female presenting for Follow-up (6 month ), Sinusitis (Severe nasal congestion. Has had sx since having covid in Jan 2022. ), and Arm Pain (Right deltoid and armpit area )     Sinusitis This is a new problem. The current episode started more than 1 month ago. There has been no fever. The pain is moderate. Associated symptoms include congestion, coughing (from drainage), ear pain, headaches, sinus pressure and a sore throat. Pertinent negatives include no chills or sneezing. (No dental pain) Past treatments include oral decongestants and saline sprays (antihistamines). The treatment provided mild relief.  Arm Pain    Right arm - rheumatologist thought it might be coming from the neck - has disc disease in the neck - sleeping on a pillow - when she lefts her arms above the head - axilla area - can feel the pain through the arm when she presses on this area - will wake her up at night - not sure if she needs to go back to her back surgeon to check her neck - is planning to get treatment for this originally - can feel nodes - feels something more solid in the axilla on the right side - will feel the radiating symptoms down the arm - will feel better with certain positions - now it hurts more than before - has been going on for months - worse at night - sleeping with the right arm up and above her head - cannot leave it at a resting position   #Bariatric surgery - 2012 - lost 85 lbs - gained 20 lbs with menopause - has had difficulty getting that off - hx of eating disorder - will go all day and not eat  #HTN - ran out of medication  - getting refilled today  Review of Systems  Constitutional: Negative for chills.  HENT: Positive for congestion, ear pain, sinus pressure and sore throat. Negative for sneezing.   Respiratory: Positive for cough (from drainage).   Neurological: Positive for headaches.     Social History    Tobacco Use  Smoking Status Never Smoker  Smokeless Tobacco Never Used  Tobacco Comment   Never Used Tobacco        Objective:    BP Readings from Last 3 Encounters:  08/06/20 (!) 142/82  06/13/20 136/64  02/07/20 130/82   Wt Readings from Last 3 Encounters:  08/06/20 248 lb 8 oz (112.7 kg)  06/13/20 245 lb 1.9 oz (111.2 kg)  02/07/20 246 lb 12 oz (111.9 kg)    BP (!) 142/82   Pulse (!) 54   Temp 97.6 F (36.4 C) (Temporal)   Ht 5\' 4"  (1.626 m)   Wt 248 lb 8 oz (112.7 kg)   LMP 01/20/2011   SpO2 99%   BMI 42.65 kg/m    Physical Exam Constitutional:      General: She is not in acute distress.    Appearance: She is well-developed. She is not diaphoretic.  HENT:     Head: Normocephalic and atraumatic.     Right Ear: Tympanic membrane, ear canal and external ear normal.     Left Ear: Tympanic membrane, ear canal and external ear normal.     Nose: Nose normal.     Right Sinus: No maxillary sinus tenderness or frontal sinus tenderness.     Left Sinus: No maxillary sinus tenderness or frontal sinus tenderness.     Mouth/Throat:  Pharynx: Uvula midline. Posterior oropharyngeal erythema present. No oropharyngeal exudate.     Tonsils: 0 on the right. 0 on the left.  Eyes:     General: No scleral icterus.    Conjunctiva/sclera: Conjunctivae normal.  Cardiovascular:     Rate and Rhythm: Normal rate and regular rhythm.     Heart sounds: Normal heart sounds. No murmur heard.   Pulmonary:     Effort: Pulmonary effort is normal. No respiratory distress.     Breath sounds: Normal breath sounds.  Musculoskeletal:     Cervical back: Neck supple.     Comments: Right axilla with muscle tension which with palpation triggers radiating symptoms  Lymphadenopathy:     Cervical: No cervical adenopathy.  Skin:    General: Skin is warm and dry.     Capillary Refill: Capillary refill takes less than 2 seconds.  Neurological:     Mental Status: She is alert. Mental status  is at baseline.  Psychiatric:        Mood and Affect: Mood normal.        Behavior: Behavior normal.           Assessment & Plan:   Problem List Items Addressed This Visit      Cardiovascular and Mediastinum   Essential hypertension    BP elevated in setting of being out of HCTZ. Refill HCTZ 25 mg. Cont potassium 20 mEq, losartan 100 mg      Relevant Medications   hydrochlorothiazide (HYDRODIURIL) 25 MG tablet     Other   MORBID OBESITY    S/p bariatric surgery but with recent weight gain. Discussed diet and importance of eating enough calories. Nutrition referral. If too expensive discussed healthy weight loss center as another option.       Relevant Orders   Amb Ref to Medical Weight Management   Pain of right upper extremity    Suspect this is muscle tension/trigger point with radiating symptoms vs some muscle tension near the brachial plexus. Pt cannot afford PT but will do some home stretches reassurance provided.        Other Visit Diagnoses    Acute non-recurrent pansinusitis    -  Primary   Relevant Medications   amoxicillin-clavulanate (AUGMENTIN) 875-125 MG tablet   fluconazole (DIFLUCAN) 150 MG tablet       Return in about 3 months (around 11/06/2020) for blood pressure.  Lesleigh Noe, MD  This visit occurred during the SARS-CoV-2 public health emergency.  Safety protocols were in place, including screening questions prior to the visit, additional usage of staff PPE, and extensive cleaning of exam room while observing appropriate contact time as indicated for disinfecting solutions.

## 2020-08-06 NOTE — Assessment & Plan Note (Signed)
BP elevated in setting of being out of HCTZ. Refill HCTZ 25 mg. Cont potassium 20 mEq, losartan 100 mg

## 2020-08-24 ENCOUNTER — Other Ambulatory Visit: Payer: Self-pay | Admitting: Family Medicine

## 2020-08-24 DIAGNOSIS — I10 Essential (primary) hypertension: Secondary | ICD-10-CM

## 2020-09-25 ENCOUNTER — Other Ambulatory Visit: Payer: Self-pay | Admitting: Family Medicine

## 2020-09-27 ENCOUNTER — Other Ambulatory Visit: Payer: Self-pay | Admitting: Family Medicine

## 2020-09-27 ENCOUNTER — Encounter: Payer: Self-pay | Admitting: Family Medicine

## 2020-09-27 DIAGNOSIS — Z1231 Encounter for screening mammogram for malignant neoplasm of breast: Secondary | ICD-10-CM

## 2020-09-27 DIAGNOSIS — R2231 Localized swelling, mass and lump, right upper limb: Secondary | ICD-10-CM

## 2020-09-27 DIAGNOSIS — N644 Mastodynia: Secondary | ICD-10-CM

## 2020-10-31 ENCOUNTER — Other Ambulatory Visit: Payer: Self-pay | Admitting: Family Medicine

## 2020-11-01 ENCOUNTER — Ambulatory Visit
Admission: RE | Admit: 2020-11-01 | Discharge: 2020-11-01 | Disposition: A | Discharge status: Home / Self Care | Payer: 59 | Source: Ambulatory Visit | Attending: Family Medicine | Admitting: Family Medicine

## 2020-11-01 ENCOUNTER — Encounter: Payer: Self-pay | Admitting: Family

## 2020-11-01 ENCOUNTER — Other Ambulatory Visit: Payer: Self-pay

## 2020-11-01 ENCOUNTER — Other Ambulatory Visit: Payer: Self-pay | Admitting: Family Medicine

## 2020-11-01 DIAGNOSIS — N644 Mastodynia: Secondary | ICD-10-CM

## 2020-11-01 DIAGNOSIS — R2231 Localized swelling, mass and lump, right upper limb: Secondary | ICD-10-CM

## 2020-11-21 ENCOUNTER — Telehealth: Payer: Self-pay | Admitting: Family Medicine

## 2020-11-21 DIAGNOSIS — I10 Essential (primary) hypertension: Secondary | ICD-10-CM

## 2020-11-21 NOTE — Telephone Encounter (Signed)
Spoke with patient scheduled follow up appointment

## 2020-12-11 ENCOUNTER — Inpatient Hospital Stay (HOSPITAL_BASED_OUTPATIENT_CLINIC_OR_DEPARTMENT_OTHER): Payer: 59 | Admitting: Hematology & Oncology

## 2020-12-11 ENCOUNTER — Other Ambulatory Visit: Payer: Self-pay

## 2020-12-11 ENCOUNTER — Encounter: Payer: Self-pay | Admitting: Hematology & Oncology

## 2020-12-11 ENCOUNTER — Inpatient Hospital Stay: Payer: 59 | Attending: Hematology & Oncology

## 2020-12-11 VITALS — BP 127/76 | HR 55 | Temp 97.8°F | Resp 17 | Wt 244.0 lb

## 2020-12-11 DIAGNOSIS — D508 Other iron deficiency anemias: Secondary | ICD-10-CM

## 2020-12-11 DIAGNOSIS — D759 Disease of blood and blood-forming organs, unspecified: Secondary | ICD-10-CM | POA: Diagnosis not present

## 2020-12-11 DIAGNOSIS — D75839 Thrombocytosis, unspecified: Secondary | ICD-10-CM | POA: Insufficient documentation

## 2020-12-11 DIAGNOSIS — D509 Iron deficiency anemia, unspecified: Secondary | ICD-10-CM | POA: Insufficient documentation

## 2020-12-11 LAB — CBC WITH DIFFERENTIAL (CANCER CENTER ONLY)
Abs Immature Granulocytes: 0.06 10*3/uL (ref 0.00–0.07)
Basophils Absolute: 0.1 10*3/uL (ref 0.0–0.1)
Basophils Relative: 1 %
Eosinophils Absolute: 0.1 10*3/uL (ref 0.0–0.5)
Eosinophils Relative: 1 %
HCT: 39.7 % (ref 36.0–46.0)
Hemoglobin: 13 g/dL (ref 12.0–15.0)
Immature Granulocytes: 1 %
Lymphocytes Relative: 48 %
Lymphs Abs: 3.4 10*3/uL (ref 0.7–4.0)
MCH: 29.3 pg (ref 26.0–34.0)
MCHC: 32.7 g/dL (ref 30.0–36.0)
MCV: 89.6 fL (ref 80.0–100.0)
Monocytes Absolute: 0.4 10*3/uL (ref 0.1–1.0)
Monocytes Relative: 5 %
Neutro Abs: 3.1 10*3/uL (ref 1.7–7.7)
Neutrophils Relative %: 44 %
Platelet Count: 474 10*3/uL — ABNORMAL HIGH (ref 150–400)
RBC: 4.43 MIL/uL (ref 3.87–5.11)
RDW: 13.7 % (ref 11.5–15.5)
WBC Count: 7.1 10*3/uL (ref 4.0–10.5)
nRBC: 0 % (ref 0.0–0.2)

## 2020-12-11 LAB — RETICULOCYTES
Immature Retic Fract: 10.2 % (ref 2.3–15.9)
RBC.: 4.43 MIL/uL (ref 3.87–5.11)
Retic Count, Absolute: 88.6 10*3/uL (ref 19.0–186.0)
Retic Ct Pct: 2 % (ref 0.4–3.1)

## 2020-12-11 LAB — CMP (CANCER CENTER ONLY)
ALT: 17 U/L (ref 0–44)
AST: 20 U/L (ref 15–41)
Albumin: 4.3 g/dL (ref 3.5–5.0)
Alkaline Phosphatase: 127 U/L — ABNORMAL HIGH (ref 38–126)
Anion gap: 9 (ref 5–15)
BUN: 15 mg/dL (ref 6–20)
CO2: 29 mmol/L (ref 22–32)
Calcium: 9.3 mg/dL (ref 8.9–10.3)
Chloride: 101 mmol/L (ref 98–111)
Creatinine: 0.65 mg/dL (ref 0.44–1.00)
GFR, Estimated: >60 mL/min (ref >60)
Glucose, Bld: 95 mg/dL (ref 70–99)
Potassium: 3.7 mmol/L (ref 3.5–5.1)
Sodium: 139 mmol/L (ref 135–145)
Total Bilirubin: 0.3 mg/dL (ref 0.3–1.2)
Total Protein: 7.3 g/dL (ref 6.5–8.1)

## 2020-12-11 LAB — IRON AND TIBC
Iron: 77 ug/dL (ref 41–142)
Saturation Ratios: 24 % (ref 21–57)
TIBC: 318 ug/dL (ref 236–444)
UIBC: 240 ug/dL (ref 120–384)

## 2020-12-11 LAB — LACTATE DEHYDROGENASE: LDH: 200 U/L — ABNORMAL HIGH (ref 98–192)

## 2020-12-11 LAB — FERRITIN: Ferritin: 46 ng/mL (ref 11–307)

## 2020-12-11 NOTE — Progress Notes (Signed)
Hematology and Oncology Follow Up Visit  [name] [id number] 06-May-1966 55 y.o. 12/11/2020   Principle Diagnosis:  Thrombocytosis Family history of lymphoma (father) Iron deficiency anemia secondary to malabsorption with bariatric surgery (2012)    Current Therapy:        Observation IV iron as indicated   Interim History:  [name] is here today for follow-up.  This is the first time that I am seeing her.  She has been seen by Judson Roch in the past..  She is doing okay.  She has a mixed connective tissue disorder.  She is followed by rheumatology.  She is on Celebrex.  She is trying to avoid having to take any kind of medications other than Celebrex.  She is on some natural remedies.  She does have mild thrombocytosis.  She has had this now for about a year.  I suspect that this is going to be reactive.  She has had no problems with bleeding.  There is been no pain in her hands or feet.  She has had no cough or shortness of breath.  There is been no numbness in her hands or feet.    She has had no issues with COVID.  She does have 10 grandchildren.  She is quite busy with them.  Her husband is a Theme park manager at [employer] that is 2 and half hours away.  It is amazing the drive there every weekend.  She has had no change in bowel or bladder habits.  There is been no bleeding.  She has had no headache.  Overall, her performance status is ECOG 1.    Medications:  Allergies as of 12/11/2020       Reactions   Penicillins Other (See Comments)   Causes severe, difficult to treat yeast infections        Medication List        Accurate as of December 11, 2020 11:07 AM. If you have any questions, ask your nurse or doctor.          Biotin 10 MG Caps every other day.   celecoxib 200 MG capsule Commonly known as: CELEBREX Take 1 capsule by mouth 2 (two) times daily.   cholecalciferol 25 MCG (1000 UNIT) tablet Commonly known as: VITAMIN D Take 1,000 Units by mouth daily.    Glucosamine 500 MG Caps Take 1 capsule by mouth daily.   hydrochlorothiazide 25 MG tablet Commonly known as: HYDRODIURIL Take 1 tablet (25 mg total) by mouth daily.   ipratropium 0.03 % nasal spray Commonly known as: ATROVENT PLACE 1 SPRAY INTO BOTH NOSTRILS 2 (TWO) TIMES DAILY. What changed:  when to take this reasons to take this   Klor-Con M20 20 MEQ tablet Generic drug: potassium chloride SA TAKE 1 TABLET BY MOUTH EVERY DAY   levothyroxine 88 MCG tablet Commonly known as: SYNTHROID Take 1 tablet (88 mcg total) by mouth daily.   loratadine 10 MG tablet Commonly known as: CLARITIN TAKE 1 TABLET BY MOUTH EVERY DAY What changed:  when to take this reasons to take this   losartan 100 MG tablet Commonly known as: COZAAR TAKE 1 TABLET BY MOUTH EVERY DAY   Magnesium 250 MG Tabs Take 1 tablet by mouth as needed. For leg cramps   NON FORMULARY CPAP MACHINE AT NIGHT-not sure of the setting   omeprazole 40 MG capsule Commonly known as: PRILOSEC Take 1 capsule (40 mg total) by mouth daily. What changed:  when to take this reasons to  take this   triamcinolone 0.025 % ointment Commonly known as: KENALOG triamcinolone acetonide 0.025 % topical ointment  APPLY TO AFFECTED AREA TWICE DAILY FOR 2 WEEKS. IF IMPROVEMENT CAN CONTINUE DAILY.   Turmeric 500 MG Caps Take 500 mg by mouth daily. Take 500 mg by mouth daily.   VITAMIN B-12 PO Take 1 tablet by mouth every other day.        Allergies:  Allergies  Allergen Reactions   Penicillins Other (See Comments)    Causes severe, difficult to treat yeast infections    Past Medical History, Surgical history, Social history, and Family History were reviewed and updated.  Review of Systems: All other 10 point review of systems is negative.   Physical Exam:  weight is 244 lb (110.7 kg). Her oral temperature is 97.8 F (36.6 C). Her blood pressure is 127/76 and her pulse is 55 (abnormal). Her respiration is 17 and  oxygen saturation is 100%.   Wt Readings from Last 3 Encounters:  12/11/20 244 lb (110.7 kg)  08/06/20 248 lb 8 oz (112.7 kg)  06/13/20 245 lb 1.9 oz (111.2 kg)    Ocular: Sclerae unicteric, pupils equal, round and reactive to light Ear-nose-throat: Oropharynx clear, dentition fair Lymphatic: No cervical or supraclavicular adenopathy Lungs no rales or rhonchi, good excursion bilaterally Heart regular rate and rhythm, no murmur appreciated Abd soft, nontender, positive bowel sounds MSK no focal spinal tenderness, no joint edema Neuro: non-focal, well-oriented, appropriate affect Breasts: Deferred   Lab Results  Component Value Date   WBC 7.1 12/11/2020   HGB 13.0 12/11/2020   HCT 39.7 12/11/2020   MCV 89.6 12/11/2020   PLT 474 (H) 12/11/2020   Lab Results  Component Value Date   FERRITIN 42 06/13/2020   IRON 95 06/13/2020   TIBC 352 06/13/2020   UIBC 257 06/13/2020   IRONPCTSAT 27 06/13/2020   Lab Results  Component Value Date   RETICCTPCT 2.0 12/11/2020   RBC 4.43 12/11/2020   RBC 4.43 12/11/2020   RETICCTABS 79.2 04/19/2014   No results found for: KPAFRELGTCHN, LAMBDASER, KAPLAMBRATIO No results found for: IGGSERUM, IGA, IGMSERUM No results found for: Odetta Pink, SPEI   Chemistry      Component Value Date/Time   NA 139 12/11/2020 1001   NA 140 08/29/2019 0000   NA 138 04/27/2017 0933   K 3.7 12/11/2020 1001   K 3.3 (L) 04/27/2017 0933   CL 101 12/11/2020 1001   CO2 29 12/11/2020 1001   CO2 26 04/27/2017 0933   BUN 15 12/11/2020 1001   BUN 15 08/29/2019 0000   BUN 12.2 04/27/2017 0933   CREATININE 0.65 12/11/2020 1001   CREATININE 0.7 04/27/2017 0933   GLU 81 08/29/2019 0000      Component Value Date/Time   CALCIUM 9.3 12/11/2020 1001   CALCIUM 9.1 04/27/2017 0933   ALKPHOS 127 (H) 12/11/2020 1001   ALKPHOS 139 04/27/2017 0933   AST 20 12/11/2020 1001   AST 25 04/27/2017 0933   ALT 17  12/11/2020 1001   ALT 19 04/27/2017 0933   BILITOT 0.3 12/11/2020 1001   BILITOT 0.58 04/27/2017 0933       Impression and Plan: [name] is a very pleasant 55 yo African American female with history of mild thrombocytosis.  She does have a history of iron deficiency secondary to bariatric surgery.  Last iron studies looked okay.  We will see what her iron studies look like this time.  I think we still get her back in 6 months.  I think this would be quite reasonable.  I do not think we have to do any type of molecular markers right now.  I do think that there is going to be any type of myeloproliferative neoplasm that could be a problem.     Volanda Napoleon, MD 7/19/202211:07 AM

## 2020-12-17 ENCOUNTER — Ambulatory Visit (INDEPENDENT_AMBULATORY_CARE_PROVIDER_SITE_OTHER): Payer: 59 | Admitting: Family Medicine

## 2020-12-17 ENCOUNTER — Other Ambulatory Visit: Payer: Self-pay

## 2020-12-17 VITALS — BP 120/82 | HR 52 | Temp 97.6°F | Ht 64.0 in | Wt 243.8 lb

## 2020-12-17 DIAGNOSIS — I10 Essential (primary) hypertension: Secondary | ICD-10-CM | POA: Diagnosis not present

## 2020-12-17 DIAGNOSIS — E039 Hypothyroidism, unspecified: Secondary | ICD-10-CM

## 2020-12-17 DIAGNOSIS — R0981 Nasal congestion: Secondary | ICD-10-CM

## 2020-12-17 LAB — TSH: TSH: 1.94 u[IU]/mL (ref 0.35–5.50)

## 2020-12-17 NOTE — Patient Instructions (Addendum)
#  Sinus - try saline rinse  - try flonase  #hypertension - continue your current medication   I would recommend the Shingles Shot - make a nurse visit when you have the next day off just incase - 2 part vaccine  - can also get the tdap vaccine as well

## 2020-12-17 NOTE — Assessment & Plan Note (Signed)
Advised trial of flonase and saline rinse. If worsening/persisting may consider abx. But suspect this is more allergic.

## 2020-12-17 NOTE — Progress Notes (Signed)
Subjective:     [name] is a 55 y.o. female presenting for Follow-up (HT)     HPI  #HTN - taking medication - walking regularly - has been trying to be more intentional about exercise - watching sodium - no cp, sob  #irritated sinuses - has been taking pseudoephedrine - using breathing strips - dried mucus - blood  - congestion - feels hard to breath - pseudoephedrine better than claritin  - using saline spray - stopped using cpap - has not used flonase - symptoms x 3 months - endorses   Review of Systems  08/06/2020: Clinic - HTN - ran out of hctz. cont potassium and losartan 100 mg  Social History   Tobacco Use  Smoking Status Never  Smokeless Tobacco Never  Tobacco Comments   Never Used Tobacco        Objective:    BP Readings from Last 3 Encounters:  12/17/20 120/82  12/11/20 127/76  08/06/20 (!) 142/82   Wt Readings from Last 3 Encounters:  12/17/20 243 lb 12 oz (110.6 kg)  12/11/20 244 lb (110.7 kg)  08/06/20 248 lb 8 oz (112.7 kg)    BP 120/82   Pulse (!) 52   Temp 97.6 F (36.4 C) (Temporal)   Ht '5\' 4"'$  (1.626 m)   Wt 243 lb 12 oz (110.6 kg)   LMP 01/20/2011   SpO2 98%   BMI 41.84 kg/m    Physical Exam Constitutional:      General: She is not in acute distress.    Appearance: She is well-developed. She is not diaphoretic.  HENT:     Right Ear: Tympanic membrane and external ear normal.     Left Ear: Tympanic membrane and external ear normal.     Nose: Mucosal edema and congestion present.  Eyes:     Conjunctiva/sclera: Conjunctivae normal.  Cardiovascular:     Rate and Rhythm: Normal rate and regular rhythm.     Heart sounds: No murmur heard. Pulmonary:     Effort: Pulmonary effort is normal. No respiratory distress.     Breath sounds: Normal breath sounds. No wheezing.  Musculoskeletal:     Cervical back: Neck supple.  Skin:    General: Skin is warm and dry.     Capillary Refill: Capillary refill takes less  than 2 seconds.  Neurological:     Mental Status: She is alert. Mental status is at baseline.  Psychiatric:        Mood and Affect: Mood normal.        Behavior: Behavior normal.          Assessment & Plan:   Problem List Items Addressed This Visit       Cardiovascular and Mediastinum   Essential hypertension    BP controlled. Cont losartan 100 mg, Hctz 25 mg, and potassium 20 meq         Respiratory   Sinus congestion    Advised trial of flonase and saline rinse. If worsening/persisting may consider abx. But suspect this is more allergic.          Endocrine   Hypothyroidism - Primary    Some fatigue, but otherwise doing well. Will recheck tsh as last one was 1 year ago. Cont levo 88 mcg pending results.        Relevant Orders   TSH     Return in about 1 year (around 12/17/2021) for annual.  Lesleigh Noe, MD  This visit occurred  during the SARS-CoV-2 public health emergency.  Safety protocols were in place, including screening questions prior to the visit, additional usage of staff PPE, and extensive cleaning of exam room while observing appropriate contact time as indicated for disinfecting solutions.

## 2020-12-17 NOTE — Assessment & Plan Note (Signed)
BP controlled. Cont losartan 100 mg, Hctz 25 mg, and potassium 20 meq

## 2020-12-17 NOTE — Assessment & Plan Note (Signed)
Some fatigue, but otherwise doing well. Will recheck tsh as last one was 1 year ago. Cont levo 88 mcg pending results.

## 2020-12-30 ENCOUNTER — Encounter: Payer: Self-pay | Admitting: Family Medicine

## 2021-01-01 ENCOUNTER — Encounter: Payer: Self-pay | Admitting: Family

## 2021-01-01 ENCOUNTER — Ambulatory Visit (INDEPENDENT_AMBULATORY_CARE_PROVIDER_SITE_OTHER): Payer: 59 | Admitting: Family Medicine

## 2021-01-01 ENCOUNTER — Encounter: Payer: Self-pay | Admitting: Family Medicine

## 2021-01-01 ENCOUNTER — Other Ambulatory Visit: Payer: Self-pay

## 2021-01-01 ENCOUNTER — Other Ambulatory Visit: Payer: Self-pay | Admitting: Family

## 2021-01-01 VITALS — BP 130/80 | HR 62 | Temp 97.4°F | Wt 245.5 lb

## 2021-01-01 DIAGNOSIS — D75839 Thrombocytosis, unspecified: Secondary | ICD-10-CM | POA: Diagnosis not present

## 2021-01-01 DIAGNOSIS — M7989 Other specified soft tissue disorders: Secondary | ICD-10-CM

## 2021-01-01 DIAGNOSIS — M25511 Pain in right shoulder: Secondary | ICD-10-CM | POA: Diagnosis not present

## 2021-01-01 NOTE — Assessment & Plan Note (Signed)
Suspect either impingement or mild arthritis vs strain. Advised trail of PT for treatment and f/u with sports medicine if not improving.

## 2021-01-01 NOTE — Progress Notes (Signed)
Subjective:     [name] is a 55 y.o. female presenting for thrombosis (Visibly moving In R forearm on 12/29/20. Had another one in July 22 in same arm. Now has an ache in arm constantly. ) and Shoulder Pain (R shoulder and clavicle area started 12/30/20)     HPI  #Thrombosis - pea-sized lesion  - that stings when traveling down the arm - travels through the vein from the upper arm down to the wrist or hand - the veins are distended and enlarged - this has happened more than once - 12/29/20 and 12/14/20 - did get one that stopped in the elbow area and left a bruise - treated with warm compress and aspirin with resolution - mom with bockages in the legs - treating with ASA - PVD but no hx of DVT  #shoulder pain - started 12/30/20 - pain for months - pain with laying down - gradually getting worse - will change position to help - initially started in the clavicle and traveling down the arm  - occasional hand swelling, but full arm does not swell  Fatigue is worse when she has lower heart rate    Review of Systems   Social History   Tobacco Use  Smoking Status Never  Smokeless Tobacco Never  Tobacco Comments   Never Used Tobacco        Objective:    BP Readings from Last 3 Encounters:  01/01/21 130/80  12/17/20 120/82  12/11/20 127/76   Wt Readings from Last 3 Encounters:  01/01/21 245 lb 8 oz (111.4 kg)  12/17/20 243 lb 12 oz (110.6 kg)  12/11/20 244 lb (110.7 kg)    BP 130/80   Pulse 62   Temp (!) 97.4 F (36.3 C) (Temporal)   Wt 245 lb 8 oz (111.4 kg)   LMP 01/20/2011   SpO2 100%   BMI 42.14 kg/m    Physical Exam Constitutional:      General: She is not in acute distress.    Appearance: She is well-developed. She is not diaphoretic.  HENT:     Right Ear: External ear normal.     Left Ear: External ear normal.  Eyes:     Conjunctiva/sclera: Conjunctivae normal.  Cardiovascular:     Rate and Rhythm: Normal rate and regular rhythm.   Pulmonary:     Effort: Pulmonary effort is normal. No respiratory distress.     Breath sounds: Normal breath sounds. No wheezing.  Musculoskeletal:     Cervical back: Neck supple.     Comments: Right shoulder Inspection: no step off Palpation: ttp along the anterior and posterior Glenohumeral joint ROM: normal, though pain with end flexion/abduction Strength: normal, though discomfort with resisted supraspinatus testing Pain with hawkings and neers   Skin:    General: Skin is warm and dry.     Capillary Refill: Capillary refill takes less than 2 seconds.     Comments: More prominent veins in the right hand but otherwise no lumps, lesions  Neurological:     Mental Status: She is alert. Mental status is at baseline.  Psychiatric:        Mood and Affect: Mood normal.        Behavior: Behavior normal.          Assessment & Plan:   Problem List Items Addressed This Visit       Hematopoietic and Hemostatic   Thrombocytosis    Follows with Dr. Marin Olp. Stable platelets on last  check. Symptoms seem like possible superficial thrombosis but quick resolution w/o infection. Advised watch and wait and check in with Dr. Marin Olp. Will also route note to him as FYI and if anything to add. Warning signs of DVT discussed.          Other   Acute pain of right shoulder - Primary    Suspect either impingement or mild arthritis vs strain. Advised trail of PT for treatment and f/u with sports medicine if not improving.        Relevant Orders   Ambulatory referral to Physical Therapy     Return if symptoms worsen or fail to improve.  Lesleigh Noe, MD  This visit occurred during the SARS-CoV-2 public health emergency.  Safety protocols were in place, including screening questions prior to the visit, additional usage of staff PPE, and extensive cleaning of exam room while observing appropriate contact time as indicated for disinfecting solutions.

## 2021-01-01 NOTE — Assessment & Plan Note (Signed)
Follows with Dr. Marin Olp. Stable platelets on last check. Symptoms seem like possible superficial thrombosis but quick resolution w/o infection. Advised watch and wait and check in with Dr. Marin Olp. Will also route note to him as FYI and if anything to add. Warning signs of DVT discussed.

## 2021-01-01 NOTE — Patient Instructions (Addendum)
Should pain - physical therapy referral - if not improvement - make appointment with Dr. Lorelei Pont in our office  Possible blood clot - contact Dr. Marin Olp

## 2021-01-02 ENCOUNTER — Ambulatory Visit (HOSPITAL_BASED_OUTPATIENT_CLINIC_OR_DEPARTMENT_OTHER)
Admission: RE | Admit: 2021-01-02 | Discharge: 2021-01-02 | Disposition: A | Discharge status: Home / Self Care | Payer: 59 | Source: Ambulatory Visit | Attending: Family | Admitting: Family

## 2021-01-02 ENCOUNTER — Telehealth: Payer: Self-pay

## 2021-01-02 DIAGNOSIS — M7989 Other specified soft tissue disorders: Secondary | ICD-10-CM | POA: Diagnosis not present

## 2021-01-02 NOTE — Telephone Encounter (Signed)
-----   Message from Eliezer Bottom, NP sent at 01/02/2021  2:27 PM EDT ----- No evidence of DVT.    ----- Message ----- From: Interface, Rad Results In Sent: 01/02/2021   2:04 PM EDT To: Eliezer Bottom, NP

## 2021-01-02 NOTE — Telephone Encounter (Signed)
Called and informed patient of u/s results, patient verbalized understanding and would like to know what Judson Roch thinks is going on.   Per Judson Roch it could be phlebitits and she could try taking a baby aspirin to see if that helps. However it did not show a blood clot so she may want to f/u with her PCP for a possible infection or ER.   Pt advised and states she will try taking a baby aspirin when incident occurs to see if that helps.

## 2021-02-20 ENCOUNTER — Other Ambulatory Visit: Payer: Self-pay | Admitting: Family Medicine

## 2021-02-20 DIAGNOSIS — I10 Essential (primary) hypertension: Secondary | ICD-10-CM

## 2021-02-21 ENCOUNTER — Other Ambulatory Visit: Payer: Self-pay | Admitting: Family Medicine

## 2021-02-21 DIAGNOSIS — E039 Hypothyroidism, unspecified: Secondary | ICD-10-CM

## 2021-04-30 ENCOUNTER — Other Ambulatory Visit: Payer: Self-pay

## 2021-04-30 ENCOUNTER — Ambulatory Visit
Admission: EM | Admit: 2021-04-30 | Discharge: 2021-04-30 | Disposition: A | Discharge status: Home / Self Care | Payer: 59 | Attending: Physician Assistant | Admitting: Physician Assistant

## 2021-04-30 DIAGNOSIS — J019 Acute sinusitis, unspecified: Secondary | ICD-10-CM | POA: Diagnosis not present

## 2021-04-30 MED ORDER — DOXYCYCLINE HYCLATE 100 MG PO CAPS: 100.0000 mg | ORAL_CAPSULE | Freq: Two times a day (BID) | ORAL | 0 refills | Status: DC

## 2021-04-30 NOTE — ED Triage Notes (Signed)
2 week h/o nasal congestion and 6 day h/o cough. Has been taking mucinex and sudafed with some relief. Pt is suspicious for sinus infection. Grandbaby with recent flu.

## 2021-04-30 NOTE — ED Provider Notes (Signed)
[facility]    CSN: [id number] Arrival date & time: 04/30/21  0901      History   Chief Complaint Chief Complaint  Patient presents with   Cough   Nasal Congestion    HPI [name] is a 55 y.o. female.   Patient here today for evaluation of sinus congestion and pressure that she has had for the last 2 weeks that seems to be worsening.  She reports last 6 days she is also had some cough.  She has not had any fever.  She has tried over-the-counter medication without significant relief.  The history is provided by the patient.  Cough Associated symptoms: sore throat   Associated symptoms: no chills, no ear pain, no eye discharge, no fever, no shortness of breath and no wheezing    Past Medical History:  Diagnosis Date   Allergic rhinitis, cause unspecified    Allergy 2016   Anemia, iron deficiency    Arthritis    COVID-19 78/93/8101   Diastolic dysfunction    Diverticulosis of colon    GERD (gastroesophageal reflux disease)    Goiter    Hiatal hernia    Hypertension    IBS (irritable bowel syndrome)    Morbid obesity (HCC)    Obstructive sleep apnea    Sleep apnea    Thrombocytosis    Undiagnosed cardiac murmurs     Patient Active Problem List   Diagnosis Date Noted   Acute pain of right shoulder 01/01/2021   Sinus congestion 12/17/2020   Abnormal stools 01/24/2020   Dizziness 01/24/2020   Leg swelling 01/24/2020   Primary generalized (osteo)arthritis 09/21/2019   Elevated serum alkaline phosphatase level 09/21/2019   Rash 09/21/2019   Left hip pain 09/21/2019   Bradycardia 11/11/2018   Vitamin D deficiency 11/11/2018   Vitamin B12 deficiency 11/11/2018   Hypokalemia 11/11/2018   Thrombocytosis 11/11/2018   Bleeding 12/02/2017   Polymenorrhea 12/02/2017   Polyp of cervix 12/02/2017   Urinary incontinence 12/02/2017   Pain of right upper extremity 09/14/2017   Hypersomnia 06/05/2016   Abdominal pain, chronic, epigastric  04/13/2015   Irregular bleeding 06/22/2014   Obesity (BMI 30-39.9) 07/25/2013   Breast mass 07/25/2013   Changing mole 01/28/2013   Dysphagia, pharyngoesophageal phase 03/10/2011   Bariatric surgery status 03/10/2011   Esophageal ring 03/10/2011   SPINAL STENOSIS, LUMBAR 04/08/2010   HIATAL HERNIA 03/08/2010   Connective tissue disease ([place]) 03/08/2010   DYSPNEA 75/02/2584   DIASTOLIC DYSFUNCTION 27/78/2423   ECHOCARDIOGRAM, ABNORMAL 12/11/2009   PALPITATIONS, OCCASIONAL 12/05/2009   CARDIAC MURMUR 12/05/2009   PAIN IN JOINT, MULTIPLE SITES 07/11/2009   MALAISE AND FATIGUE 07/05/2009   Obstructive sleep apnea 09/25/2008   GERD 01/04/2008   IBS 01/04/2008   Goiter 11/30/2007   MORBID OBESITY 11/22/2007   ANEMIA 08/23/2007   Allergic rhinitis 08/23/2007   DIVERTICULOSIS, COLON 08/23/2007   Other iron deficiency anemias 12/23/2006   Hypothyroidism 12/17/2006   Disease of blood and blood forming organ 12/17/2006   Essential hypertension 12/17/2006   LOW BACK PAIN 12/17/2006    Past Surgical History:  Procedure Laterality Date   ABDOMINAL HYSTERECTOMY  05/2013   BARIATRIC SURGERY  06/17/2010   BILATERAL SALPINGECTOMY Bilateral 06/22/2014   Procedure: BILATERAL SALPINGECTOMY;  Surgeon: Crawford Givens, MD;  Location: [facility];  Service: Gynecology;  Laterality: Bilateral;   CYSTOSCOPY N/A 06/22/2014   Procedure: CYSTOSCOPY;  Surgeon: Crawford Givens, MD;  Location: [facility];  Service: Gynecology;  Laterality: N/A;  ENDOMETRIAL ABLATION  12/08   FOOT SURGERY Left 11/24/2017   Dr Paulla Dolly   LAPAROSCOPIC ASSISTED VAGINAL HYSTERECTOMY N/A 06/22/2014   Procedure: LAPAROSCOPIC ASSISTED VAGINAL HYSTERECTOMY;  Surgeon: Crawford Givens, MD;  Location: [facility];  Service: Gynecology;  Laterality: N/A;   LIPOMA EXCISION     upper back   SPINE SURGERY  02/10/2012   L4-l5 fusion--[facility]    OB History   No obstetric history on file.      Home Medications    Prior to Admission medications    Medication Sig Start Date End Date Taking? Authorizing Provider  doxycycline (VIBRAMYCIN) 100 MG capsule Take 1 capsule (100 mg total) by mouth 2 (two) times daily. 04/30/21  Yes Francene Finders, PA-C  Biotin 10 MG CAPS every other day.     [provider]  celecoxib (CELEBREX) 200 MG capsule Take 1 capsule by mouth 2 (two) times daily. Patient not taking: Reported on 01/01/2021 09/21/19   [provider]  cholecalciferol (VITAMIN D) 25 MCG (1000 UNIT) tablet Take 1,000 Units by mouth daily.    [provider]  Cyanocobalamin (VITAMIN B-12 PO) Take 1 tablet by mouth every other day.     [provider]  Glucosamine 500 MG CAPS Take 1 capsule by mouth daily.    [provider]  hydrochlorothiazide (HYDRODIURIL) 25 MG tablet Take 1 tablet (25 mg total) by mouth daily. 08/06/20   Lesleigh Noe, MD  Ibuprofen 200 MG CAPS Take 600 mg by mouth daily at 12 noon.    [provider]  ipratropium (ATROVENT) 0.03 % nasal spray PLACE 1 SPRAY INTO BOTH NOSTRILS 2 (TWO) TIMES DAILY. Patient taking differently: Place 1 spray into both nostrils as needed. 10/11/18   Carollee Herter, Yvonne R, DO  KLOR-CON M20 20 MEQ tablet TAKE 1 TABLET BY MOUTH EVERY DAY 11/01/20   Lesleigh Noe, MD  levothyroxine (SYNTHROID) 88 MCG tablet TAKE 1 TABLET BY MOUTH EVERY DAY 02/25/21   Lesleigh Noe, MD  loratadine (CLARITIN) 10 MG tablet TAKE 1 TABLET BY MOUTH EVERY DAY Patient taking differently: Take 10 mg by mouth as needed. 03/23/18   Roma Schanz R, DO  losartan (COZAAR) 100 MG tablet TAKE 1 TABLET BY MOUTH EVERY DAY 02/20/21   Lesleigh Noe, MD  Magnesium 250 MG TABS Take 1 tablet by mouth as needed. For leg cramps    [provider]  NON FORMULARY CPAP MACHINE AT NIGHT-not sure of the setting Patient not taking: Reported on 01/01/2021    [provider]  omeprazole (PRILOSEC) 40 MG capsule Take 1 capsule (40 mg total) by mouth daily. Patient taking  differently: Take 40 mg by mouth as needed. 04/23/16   Roma Schanz R, DO  triamcinolone (KENALOG) 0.025 % ointment triamcinolone acetonide 0.025 % topical ointment  APPLY TO AFFECTED AREA TWICE DAILY FOR 2 WEEKS. IF IMPROVEMENT CAN CONTINUE DAILY.    [provider]  Turmeric 500 MG CAPS Take 500 mg by mouth daily. Take 500 mg by mouth daily.    [provider]    Family History Family History  Problem Relation Age of Onset   Breast cancer Mother 53   Arrhythmia Mother    Heart disease Mother    Arthritis Mother    Diabetes Mother    Hyperlipidemia Mother    Hypertension Mother    Hypertension Father    Kidney cancer Father    Colon polyps Father    Lymphoma  Father        Diffused Large B-cell Lymphoma   Breast cancer Maternal Aunt 58   Stroke Maternal Grandmother 85   Cervical cancer Paternal Grandmother    Colon cancer Neg Hx     Social History Social History   Tobacco Use   Smoking status: Never   Smokeless tobacco: Never   Tobacco comments:    Never Used Tobacco  Vaping Use   Vaping Use: Never used  Substance Use Topics   Alcohol use: No    Alcohol/week: 0.0 standard drinks   Drug use: No     Allergies   Penicillins   Review of Systems Review of Systems  Constitutional:  Negative for chills and fever.  HENT:  Positive for congestion, sinus pressure and sore throat. Negative for ear pain.   Eyes:  Negative for discharge and redness.  Respiratory:  Positive for cough. Negative for shortness of breath and wheezing.   Gastrointestinal:  Negative for abdominal pain, diarrhea, nausea and vomiting.    Physical Exam Triage Vital Signs ED Triage Vitals  Enc Vitals Group     BP 04/30/21 1040 (!) 146/86     Pulse Rate 04/30/21 1040 60     Resp 04/30/21 1040 18     Temp 04/30/21 1040 (!) 97.5 F (36.4 C)     Temp Source 04/30/21 1040 Oral     SpO2 04/30/21 1040 99 %     Weight --      Height --      Head Circumference --       Peak Flow --      Pain Score 04/30/21 1044 0     Pain Loc --      Pain Edu? --      Excl. in [place]? --    No data found.  Updated Vital Signs BP (!) 146/86 (BP Location: Left Arm)   Pulse 60   Temp (!) 97.5 F (36.4 C) (Oral)   Resp 18   LMP 01/20/2011   SpO2 99%      Physical Exam Vitals and nursing note reviewed.  Constitutional:      General: She is not in acute distress.    Appearance: Normal appearance. She is not ill-appearing.  HENT:     Head: Normocephalic and atraumatic.     Nose: Congestion present.     Mouth/Throat:     Mouth: Mucous membranes are moist.     Pharynx: No oropharyngeal exudate or posterior oropharyngeal erythema.  Eyes:     Conjunctiva/sclera: Conjunctivae normal.  Cardiovascular:     Rate and Rhythm: Normal rate and regular rhythm.     Heart sounds: Normal heart sounds. No murmur heard. Pulmonary:     Effort: Pulmonary effort is normal. No respiratory distress.     Breath sounds: Normal breath sounds. No wheezing, rhonchi or rales.  Skin:    General: Skin is warm and dry.  Neurological:     Mental Status: She is alert.  Psychiatric:        Mood and Affect: Mood normal.        Thought Content: Thought content normal.     UC Treatments / Results  Labs (all labs ordered are listed, but only abnormal results are displayed) Labs Reviewed - No data to display  EKG   Radiology No results found.  Procedures Procedures (including critical care time)  Medications Ordered in UC Medications - No data to display  Initial Impression / Assessment and Plan /  UC Course  I have reviewed the triage vital signs and the nursing notes.  Pertinent labs & imaging results that were available during my care of the patient were reviewed by me and considered in my medical decision making (see chart for details).    Suspect likely sinusitis and will treat with doxycycline.  Recommend follow-up if symptoms fail to improve or worsen.  Final Clinical  Impressions(s) / UC Diagnoses   Final diagnoses:  Acute sinusitis, recurrence not specified, unspecified location   Discharge Instructions   None    ED Prescriptions     Medication Sig Dispense Auth. Provider   doxycycline (VIBRAMYCIN) 100 MG capsule Take 1 capsule (100 mg total) by mouth 2 (two) times daily. 20 capsule Francene Finders, PA-C      PDMP not reviewed this encounter.   Francene Finders, PA-C 04/30/21 1155

## 2021-05-23 ENCOUNTER — Encounter: Payer: Self-pay | Admitting: Family

## 2021-06-17 ENCOUNTER — Encounter: Payer: Self-pay | Admitting: Family

## 2021-06-18 ENCOUNTER — Other Ambulatory Visit: Payer: Self-pay

## 2021-06-18 ENCOUNTER — Encounter: Payer: Self-pay | Admitting: Family

## 2021-06-18 ENCOUNTER — Inpatient Hospital Stay: Payer: 59 | Attending: Hematology & Oncology

## 2021-06-18 ENCOUNTER — Inpatient Hospital Stay (HOSPITAL_BASED_OUTPATIENT_CLINIC_OR_DEPARTMENT_OTHER): Payer: 59 | Admitting: Family

## 2021-06-18 VITALS — BP 160/76 | HR 59 | Temp 98.1°F | Resp 17 | Ht 66.0 in | Wt 243.1 lb

## 2021-06-18 DIAGNOSIS — D75839 Thrombocytosis, unspecified: Secondary | ICD-10-CM

## 2021-06-18 DIAGNOSIS — K909 Intestinal malabsorption, unspecified: Secondary | ICD-10-CM | POA: Diagnosis not present

## 2021-06-18 DIAGNOSIS — Z9884 Bariatric surgery status: Secondary | ICD-10-CM | POA: Insufficient documentation

## 2021-06-18 DIAGNOSIS — D509 Iron deficiency anemia, unspecified: Secondary | ICD-10-CM | POA: Insufficient documentation

## 2021-06-18 DIAGNOSIS — D759 Disease of blood and blood-forming organs, unspecified: Secondary | ICD-10-CM

## 2021-06-18 DIAGNOSIS — D508 Other iron deficiency anemias: Secondary | ICD-10-CM | POA: Diagnosis not present

## 2021-06-18 DIAGNOSIS — R5383 Other fatigue: Secondary | ICD-10-CM | POA: Diagnosis not present

## 2021-06-18 LAB — CBC WITH DIFFERENTIAL (CANCER CENTER ONLY)
Abs Immature Granulocytes: 0.02 10*3/uL (ref 0.00–0.07)
Basophils Absolute: 0.1 10*3/uL (ref 0.0–0.1)
Basophils Relative: 1 %
Eosinophils Absolute: 0.1 10*3/uL (ref 0.0–0.5)
Eosinophils Relative: 1 %
HCT: 42.4 % (ref 36.0–46.0)
Hemoglobin: 13.7 g/dL (ref 12.0–15.0)
Immature Granulocytes: 0 %
Lymphocytes Relative: 51 %
Lymphs Abs: 4.2 10*3/uL — ABNORMAL HIGH (ref 0.7–4.0)
MCH: 29 pg (ref 26.0–34.0)
MCHC: 32.3 g/dL (ref 30.0–36.0)
MCV: 89.6 fL (ref 80.0–100.0)
Monocytes Absolute: 0.5 10*3/uL (ref 0.1–1.0)
Monocytes Relative: 6 %
Neutro Abs: 3.4 10*3/uL (ref 1.7–7.7)
Neutrophils Relative %: 41 %
Platelet Count: 478 10*3/uL — ABNORMAL HIGH (ref 150–400)
RBC: 4.73 MIL/uL (ref 3.87–5.11)
RDW: 13.5 % (ref 11.5–15.5)
WBC Count: 8.3 10*3/uL (ref 4.0–10.5)
nRBC: 0 % (ref 0.0–0.2)

## 2021-06-18 LAB — CMP (CANCER CENTER ONLY)
ALT: 18 U/L (ref 0–44)
AST: 24 U/L (ref 15–41)
Albumin: 4.6 g/dL (ref 3.5–5.0)
Alkaline Phosphatase: 146 U/L — ABNORMAL HIGH (ref 38–126)
Anion gap: 8 (ref 5–15)
BUN: 12 mg/dL (ref 6–20)
CO2: 30 mmol/L (ref 22–32)
Calcium: 9.8 mg/dL (ref 8.9–10.3)
Chloride: 100 mmol/L (ref 98–111)
Creatinine: 0.68 mg/dL (ref 0.44–1.00)
GFR, Estimated: >60 mL/min (ref >60)
Glucose, Bld: 78 mg/dL (ref 70–99)
Potassium: 3.8 mmol/L (ref 3.5–5.1)
Sodium: 138 mmol/L (ref 135–145)
Total Bilirubin: 0.4 mg/dL (ref 0.3–1.2)
Total Protein: 7.4 g/dL (ref 6.5–8.1)

## 2021-06-18 LAB — SAVE SMEAR(SSMR), FOR PROVIDER SLIDE REVIEW

## 2021-06-18 LAB — IRON AND IRON BINDING CAPACITY (CC-WL,HP ONLY)
Iron: 96 ug/dL (ref 28–170)
Saturation Ratios: 27 % (ref 10.4–31.8)
TIBC: 356 ug/dL (ref 250–450)
UIBC: 260 ug/dL (ref 148–442)

## 2021-06-18 LAB — FERRITIN: Ferritin: 44 ng/mL (ref 11–307)

## 2021-06-18 LAB — LACTATE DEHYDROGENASE: LDH: 209 U/L — ABNORMAL HIGH (ref 98–192)

## 2021-06-18 NOTE — Progress Notes (Signed)
Hematology and Oncology Follow Up Visit  [name] [id number] 1966-04-10 56 y.o. 06/18/2021   Principle Diagnosis:  Thrombocytosis Family history of lymphoma (father) Iron deficiency anemia secondary to malabsorption with bariatric surgery (2012)    Current Therapy:        Observation IV iron as indicated   Interim History:  [name] is here today for follow-up. She is symptomatic with fatigue.  She states that she has been battling a sinus infection for several weeks and has been on a few rounds of antibiotics. She is currently on Doxycycline.  She has some SOB with over exertion.  She has had a few episodes of lightheadedness.  No fever, chills, n/v, cough, rash, chest pain, palpitations, abdominal pain or changes in bowel or bladder habits.  No blood loss noted. No bruising or petechiae.  She has intermittent fluid retention in her lower extremities and hands. She notes that this improves with propping up her legs.  No numbness or tingling in her extremities at this time.  No falls or syncope.  She has maintained a good appetite and is doing her best to stay well hydrated. Her weight is stable at 243 lbs.   ECOG Performance Status: 1 - Symptomatic but completely ambulatory  Medications:  Allergies as of 06/18/2021       Reactions   Penicillins Other (See Comments)   Causes severe, difficult to treat yeast infections        Medication List        Accurate as of June 18, 2021 10:06 AM. If you have any questions, ask your nurse or doctor.          Biotin 10 MG Caps every other day.   celecoxib 200 MG capsule Commonly known as: CELEBREX Take 1 capsule by mouth 2 (two) times daily.   cholecalciferol 25 MCG (1000 UNIT) tablet Commonly known as: VITAMIN D Take 1,000 Units by mouth daily.   doxycycline 100 MG capsule Commonly known as: VIBRAMYCIN Take 1 capsule (100 mg total) by mouth 2 (two) times daily.   Glucosamine 500 MG Caps Take 1  capsule by mouth daily.   hydrochlorothiazide 25 MG tablet Commonly known as: HYDRODIURIL Take 1 tablet (25 mg total) by mouth daily.   Ibuprofen 200 MG Caps Take 600 mg by mouth daily at 12 noon.   ipratropium 0.03 % nasal spray Commonly known as: ATROVENT PLACE 1 SPRAY INTO BOTH NOSTRILS 2 (TWO) TIMES DAILY. What changed:  when to take this reasons to take this   Klor-Con M20 20 MEQ tablet Generic drug: potassium chloride SA TAKE 1 TABLET BY MOUTH EVERY DAY   levothyroxine 88 MCG tablet Commonly known as: SYNTHROID TAKE 1 TABLET BY MOUTH EVERY DAY   loratadine 10 MG tablet Commonly known as: CLARITIN TAKE 1 TABLET BY MOUTH EVERY DAY What changed:  when to take this reasons to take this   losartan 100 MG tablet Commonly known as: COZAAR TAKE 1 TABLET BY MOUTH EVERY DAY   Magnesium 250 MG Tabs Take 1 tablet by mouth as needed. For leg cramps   NON FORMULARY CPAP MACHINE AT NIGHT-not sure of the setting   omeprazole 40 MG capsule Commonly known as: PRILOSEC Take 1 capsule (40 mg total) by mouth daily. What changed:  when to take this reasons to take this   triamcinolone 0.025 % ointment Commonly known as: KENALOG triamcinolone acetonide 0.025 % topical ointment  APPLY TO AFFECTED AREA TWICE DAILY FOR 2 WEEKS. IF IMPROVEMENT  CAN CONTINUE DAILY.   Turmeric 500 MG Caps Take 500 mg by mouth daily. Take 500 mg by mouth daily.   VITAMIN B-12 PO Take 1 tablet by mouth every other day.        Allergies:  Allergies  Allergen Reactions   Penicillins Other (See Comments)    Causes severe, difficult to treat yeast infections    Past Medical History, Surgical history, Social history, and Family History were reviewed and updated.  Review of Systems: All other 10 point review of systems is negative.   Physical Exam:  vitals were not taken for this visit.   Wt Readings from Last 3 Encounters:  01/01/21 245 lb 8 oz (111.4 kg)  12/17/20 243 lb 12 oz (110.6  kg)  12/11/20 244 lb (110.7 kg)    Ocular: Sclerae unicteric, pupils equal, round and reactive to light Ear-nose-throat: Oropharynx clear, dentition fair Lymphatic: No cervical or supraclavicular adenopathy Lungs no rales or rhonchi, good excursion bilaterally Heart regular rate and rhythm, no murmur appreciated Abd soft, nontender, positive bowel sounds MSK no focal spinal tenderness, no joint edema Neuro: non-focal, well-oriented, appropriate affect Breasts: Deferred   Lab Results  Component Value Date   WBC 8.3 06/18/2021   HGB 13.7 06/18/2021   HCT 42.4 06/18/2021   MCV 89.6 06/18/2021   PLT 478 (H) 06/18/2021   Lab Results  Component Value Date   FERRITIN 46 12/11/2020   IRON 77 12/11/2020   TIBC 318 12/11/2020   UIBC 240 12/11/2020   IRONPCTSAT 24 12/11/2020   Lab Results  Component Value Date   RETICCTPCT 2.0 12/11/2020   RBC 4.73 06/18/2021   RETICCTABS 79.2 04/19/2014   No results found for: KPAFRELGTCHN, LAMBDASER, KAPLAMBRATIO No results found for: IGGSERUM, IGA, IGMSERUM No results found for: Odetta Pink, SPEI   Chemistry      Component Value Date/Time   NA 139 12/11/2020 1001   NA 140 08/29/2019 0000   NA 138 04/27/2017 0933   K 3.7 12/11/2020 1001   K 3.3 (L) 04/27/2017 0933   CL 101 12/11/2020 1001   CO2 29 12/11/2020 1001   CO2 26 04/27/2017 0933   BUN 15 12/11/2020 1001   BUN 15 08/29/2019 0000   BUN 12.2 04/27/2017 0933   CREATININE 0.65 12/11/2020 1001   CREATININE 0.7 04/27/2017 0933   GLU 81 08/29/2019 0000      Component Value Date/Time   CALCIUM 9.3 12/11/2020 1001   CALCIUM 9.1 04/27/2017 0933   ALKPHOS 127 (H) 12/11/2020 1001   ALKPHOS 139 04/27/2017 0933   AST 20 12/11/2020 1001   AST 25 04/27/2017 0933   ALT 17 12/11/2020 1001   ALT 19 04/27/2017 0933   BILITOT 0.3 12/11/2020 1001   BILITOT 0.58 04/27/2017 0933       Impression and Plan: [name] is a very  pleasant 56 yo African American female with history of mild thrombocytosis as well as intermittent iron deficiency anemia secondary to malabsorption. Iron studies are pending. We will replace if needed.  Follow-up in 6 months.   Lottie Dawson, NP 1/24/202310:06 AM

## 2021-06-18 NOTE — Addendum Note (Signed)
Addended by: Lucile Crater on: 06/18/2021 11:30 AM   Modules accepted: Orders

## 2021-07-30 DIAGNOSIS — M359 Systemic involvement of connective tissue, unspecified: Secondary | ICD-10-CM | POA: Diagnosis not present

## 2021-07-30 DIAGNOSIS — Z6841 Body Mass Index (BMI) 40.0 and over, adult: Secondary | ICD-10-CM | POA: Diagnosis not present

## 2021-07-30 DIAGNOSIS — R5383 Other fatigue: Secondary | ICD-10-CM | POA: Diagnosis not present

## 2021-07-30 DIAGNOSIS — M1991 Primary osteoarthritis, unspecified site: Secondary | ICD-10-CM | POA: Diagnosis not present

## 2021-07-30 DIAGNOSIS — M2550 Pain in unspecified joint: Secondary | ICD-10-CM | POA: Diagnosis not present

## 2021-07-30 DIAGNOSIS — M791 Myalgia, unspecified site: Secondary | ICD-10-CM | POA: Diagnosis not present

## 2021-07-30 DIAGNOSIS — R748 Abnormal levels of other serum enzymes: Secondary | ICD-10-CM | POA: Diagnosis not present

## 2021-07-30 DIAGNOSIS — R350 Frequency of micturition: Secondary | ICD-10-CM | POA: Diagnosis not present

## 2021-07-30 LAB — LAB REPORT - SCANNED: EGFR: 104

## 2021-08-02 ENCOUNTER — Other Ambulatory Visit: Payer: Self-pay | Admitting: Family Medicine

## 2021-08-02 DIAGNOSIS — I10 Essential (primary) hypertension: Secondary | ICD-10-CM

## 2021-08-07 ENCOUNTER — Other Ambulatory Visit: Payer: Self-pay

## 2021-08-07 ENCOUNTER — Encounter: Payer: Self-pay | Admitting: Family Medicine

## 2021-08-07 ENCOUNTER — Encounter: Payer: Self-pay | Admitting: Family

## 2021-08-07 ENCOUNTER — Ambulatory Visit (INDEPENDENT_AMBULATORY_CARE_PROVIDER_SITE_OTHER): Payer: 59 | Admitting: Family Medicine

## 2021-08-07 ENCOUNTER — Other Ambulatory Visit: Payer: Self-pay | Admitting: Family Medicine

## 2021-08-07 VITALS — BP 128/70 | HR 60 | Temp 97.9°F | Ht 63.75 in | Wt 242.0 lb

## 2021-08-07 DIAGNOSIS — Z807 Family history of other malignant neoplasms of lymphoid, hematopoietic and related tissues: Secondary | ICD-10-CM | POA: Diagnosis not present

## 2021-08-07 DIAGNOSIS — E559 Vitamin D deficiency, unspecified: Secondary | ICD-10-CM

## 2021-08-07 DIAGNOSIS — E782 Mixed hyperlipidemia: Secondary | ICD-10-CM | POA: Diagnosis not present

## 2021-08-07 DIAGNOSIS — Z23 Encounter for immunization: Secondary | ICD-10-CM

## 2021-08-07 DIAGNOSIS — Z0000 Encounter for general adult medical examination without abnormal findings: Secondary | ICD-10-CM

## 2021-08-07 DIAGNOSIS — Z8041 Family history of malignant neoplasm of ovary: Secondary | ICD-10-CM

## 2021-08-07 DIAGNOSIS — E538 Deficiency of other specified B group vitamins: Secondary | ICD-10-CM | POA: Diagnosis not present

## 2021-08-07 DIAGNOSIS — R7303 Prediabetes: Secondary | ICD-10-CM

## 2021-08-07 DIAGNOSIS — J309 Allergic rhinitis, unspecified: Secondary | ICD-10-CM

## 2021-08-07 DIAGNOSIS — Z803 Family history of malignant neoplasm of breast: Secondary | ICD-10-CM

## 2021-08-07 DIAGNOSIS — I10 Essential (primary) hypertension: Secondary | ICD-10-CM

## 2021-08-07 LAB — LIPID PANEL
Cholesterol: 191 mg/dL (ref 0–200)
HDL: 55.2 mg/dL (ref >39.00)
LDL Cholesterol: 121 mg/dL — ABNORMAL HIGH (ref 0–99)
NonHDL: 136.13
Total CHOL/HDL Ratio: 3
Triglycerides: 75 mg/dL (ref 0.0–149.0)
VLDL: 15 mg/dL (ref 0.0–40.0)

## 2021-08-07 LAB — HEMOGLOBIN A1C: Hgb A1c MFr Bld: 5.9 % (ref 4.6–6.5)

## 2021-08-07 LAB — VITAMIN D 25 HYDROXY (VIT D DEFICIENCY, FRACTURES): VITD: 26.93 ng/mL — ABNORMAL LOW (ref 30.00–100.00)

## 2021-08-07 LAB — VITAMIN B12: Vitamin B-12: 1247 pg/mL — ABNORMAL HIGH (ref 211–911)

## 2021-08-07 MED ORDER — FLUTICASONE PROPIONATE 50 MCG/ACT NA SUSP: 2.0000 | Freq: Every day | NASAL | 6 refills | Status: AC

## 2021-08-07 MED ORDER — CHOLECALCIFEROL 1.25 MG (50000 UT) PO TABS: 1.0000 | ORAL_TABLET | ORAL | 1 refills | Status: DC

## 2021-08-07 NOTE — Patient Instructions (Signed)
Sinus ?- try a different over the counter allergy medicine ?- if pressure, headaches persisting - could consider antibiotics ? ?Genetics ?- referral placed ?

## 2021-08-07 NOTE — Assessment & Plan Note (Addendum)
Advised switching up her over-the-counter allergy medicine to try Zyrtec or Allegra.  Update if symptoms or not improving.  Continue Flonase ?

## 2021-08-07 NOTE — Progress Notes (Signed)
Annual Exam  ? ?Chief Complaint:  ?Chief Complaint  ?Patient presents with  ? Annual Exam  ? Sinus Problem  ? ? ?History of Present Illness:  ?[name] is a 56 y.o. No obstetric history on file. who LMP was Patient's last menstrual period was 01/20/2011., presents today for her annual examination.   ? ?#Sinus ?- x 1 month ?- sinus pressure/pain ?- congestion ?- runny nose ?- no HA ?- no dental pain ?- treatment: pseudoephedrine, nose spray, saline spray, Claritin ?- constant on mild ?- worse recently and drainage ? ? ?Nutrition ?She does get adequate calcium and Vitamin D in her diet. ?Diet: trying to eat more regularly, has lost some inches, low appetite recently ?Exercise: trying to use the stairs more, trying to be active w/in her day ? ? ? ?Social History  ? ?Tobacco Use  ?Smoking Status Never  ?Smokeless Tobacco Never  ?Tobacco Comments  ? Never Used Tobacco  ? ?Social History  ? ?Substance and Sexual Activity  ?Alcohol Use No  ? Alcohol/week: 0.0 standard drinks  ? ?Social History  ? ?Substance and Sexual Activity  ?Drug Use No  ? ? ? ?General Health ?Dentist in the last year: No ?Eye doctor: yes ? ?Safety ?The patient wears seatbelts: yes.     ?The patient feels safe at home and in their relationships: yes. ? ? ?Menstrual:  ?Symptoms of menopause: hot flashes ?S/p hysterectomy ? ?GYN ?She is single partner, contraception - status post hysterectomy.  ? ? ?Cervical Cancer Screening (21-65):   ?S/p hysterectomy ? ?Breast Cancer Screening (Age 41-74):  ?There is FH of breast cancer. There is no FH of ovarian cancer. BRCA screening Yes.  ?Last Mammogram: 10/2020 ?The patient does want a mammogram this year.  ? ? ?Colon Cancer Screening:  ?Age 46-75 yo - benefits outweigh the risk. Adults 54-85 yo who have never been screened benefit.  ?Benefits: 134000 people in 2016 will be diagnosed and 49,000 will die - early detection helps ?Harms: Complications 2/2 to colonoscopy ?High Risk (Colonoscopy): genetic  disorder (Lynch syndrome or familial adenomatous polyposis), personal hx of IBD, previous adenomatous polyp, or previous colorectal cancer, FamHx start 10 years before the age at diagnosis, increased in males and black race ? ?Options:  ?FIT - looks for hemoglobin (blood in the stool) - specific and fairly sensitive - must be done annually ?Cologuard - looks for DNA and blood - more sensitive - therefore can have more false positives, every 3 years ?Colonoscopy - every 10 years if normal - sedation, bowl prep, must have someone drive you ? ?Shared decision making and the patient had decided to do colonoscopy 2029. ? ? ?Social History  ? ?Tobacco Use  ?Smoking Status Never  ?Smokeless Tobacco Never  ?Tobacco Comments  ? Never Used Tobacco  ? ? ?Lung Cancer Screening (Ages 85-88): not applicable ? ?Weight ?Wt Readings from Last 3 Encounters:  ?08/07/21 242 lb (109.8 kg)  ?06/18/21 243 lb 1.3 oz (110.3 kg)  ?01/01/21 245 lb 8 oz (111.4 kg)  ? ?Patient has very high BMI  ?BMI Readings from Last 1 Encounters:  ?08/07/21 41.87 kg/m?  ? ? ? ?Chronic disease screening ?Blood pressure monitoring:  ?BP Readings from Last 3 Encounters:  ?08/07/21 128/70  ?06/18/21 (!) 160/76  ?04/30/21 (!) 146/86  ? ? ?Lipid Monitoring: Indication for screening: age >26, obesity, diabetes, family hx, CV risk factors.  ?Lipid screening: Yes ? ?Lab Results  ?Component Value Date  ? CHOL 202 (H)  02/07/2020  ? HDL 57.30 02/07/2020  ? LDLCALC 130 (H) 02/07/2020  ? TRIG 74.0 02/07/2020  ? CHOLHDL 4 02/07/2020  ? ? ? ?Diabetes Screening: age >64, overweight, family hx, PCOS, hx of gestational diabetes, at risk ethnicity ?Diabetes Screening screening: Yes ? ?Lab Results  ?Component Value Date  ? HGBA1C 5.8 07/28/2014  ? ? ? ?Past Medical History:  ?Diagnosis Date  ? Allergic rhinitis, cause unspecified   ? Allergy 2016  ? Anemia, iron deficiency   ? Arthritis   ? COVID-19 06/15/2020  ? Diastolic dysfunction   ? Diverticulosis of colon   ? GERD  (gastroesophageal reflux disease)   ? Goiter   ? Hiatal hernia   ? Hypertension   ? IBS (irritable bowel syndrome)   ? Morbid obesity ([place])   ? Obstructive sleep apnea   ? Sleep apnea   ? Thrombocytosis   ? Undiagnosed cardiac murmurs   ? ? ?Past Surgical History:  ?Procedure Laterality Date  ? ABDOMINAL HYSTERECTOMY  05/2013  ? BARIATRIC SURGERY  06/17/2010  ? BILATERAL SALPINGECTOMY Bilateral 06/22/2014  ? Procedure: BILATERAL SALPINGECTOMY;  Surgeon: Crawford Givens, MD;  Location: [facility];  Service: Gynecology;  Laterality: Bilateral;  ? CYSTOSCOPY N/A 06/22/2014  ? Procedure: CYSTOSCOPY;  Surgeon: Crawford Givens, MD;  Location: [facility];  Service: Gynecology;  Laterality: N/A;  ? ENDOMETRIAL ABLATION  12/08  ? FOOT SURGERY Left 11/24/2017  ? Dr Paulla Dolly  ? LAPAROSCOPIC ASSISTED VAGINAL HYSTERECTOMY N/A 06/22/2014  ? Procedure: LAPAROSCOPIC ASSISTED VAGINAL HYSTERECTOMY;  Surgeon: Crawford Givens, MD;  Location: [facility];  Service: Gynecology;  Laterality: N/A;  ? LIPOMA EXCISION    ? upper back  ? SPINE SURGERY  02/10/2012  ? L4-l5 fusion--cohen  ? ? ?Prior to Admission medications   ?Medication Sig Start Date End Date Taking? Authorizing Provider  ?Biotin 10 MG CAPS every other day.     [provider]  ?celecoxib (CELEBREX) 200 MG capsule Take 1 capsule by mouth 2 (two) times daily. 09/21/19   [provider]  ?cholecalciferol (VITAMIN D) 25 MCG (1000 UNIT) tablet Take 1,000 Units by mouth daily.    [provider]  ?Cholecalciferol (VITAMIN D-3) 25 MCG (1000 UT) CAPS 1 capsule    [provider]  ?Cyanocobalamin (VITAMIN B-12 PO) Take 1 tablet by mouth every other day.     [provider]  ?Glucosamine 500 MG CAPS Take 1 capsule by mouth daily.    [provider]  ?Glucosamine 500 MG CAPS 1 capsule with a meal    [provider]  ?hydrochlorothiazide (HYDRODIURIL) 25 MG tablet TAKE 1 TABLET (25 MG TOTAL) BY MOUTH DAILY. 08/03/21   Lesleigh Noe, MD  ?Ibuprofen 200  MG CAPS Take 600 mg by mouth daily at 12 noon.    [provider]  ?ipratropium (ATROVENT) 0.03 % nasal spray PLACE 1 SPRAY INTO BOTH NOSTRILS 2 (TWO) TIMES DAILY. ?Patient taking differently: Place 1 spray into both nostrils as needed. 10/11/18   Roma Schanz R, DO  ?KLOR-CON M20 20 MEQ tablet TAKE 1 TABLET BY MOUTH EVERY DAY 11/01/20   Lesleigh Noe, MD  ?levothyroxine (SYNTHROID) 88 MCG tablet TAKE 1 TABLET BY MOUTH EVERY DAY 02/25/21   Lesleigh Noe, MD  ?levothyroxine (SYNTHROID) 88 MCG tablet 1 tablet on an empty stomach in the morning    [provider]  ?loratadine (CLARITIN) 10 MG tablet TAKE 1 TABLET BY MOUTH EVERY DAY ?Patient taking differently: Take 10 mg  by mouth as needed. 03/23/18   Ann Held, DO  ?loratadine (CLARITIN) 10 MG tablet 1 tablet    [provider]  ?losartan (COZAAR) 100 MG tablet TAKE 1 TABLET BY MOUTH EVERY DAY 02/20/21   Lesleigh Noe, MD  ?losartan (COZAAR) 100 MG tablet 1 tablet    [provider]  ?Magnesium 250 MG TABS Take 1 tablet by mouth as needed. For leg cramps    [provider]  ?omeprazole (PRILOSEC) 40 MG capsule Take 1 capsule (40 mg total) by mouth daily. ?Patient taking differently: Take 40 mg by mouth as needed. 04/23/16   Ann Held, DO  ?triamcinolone (KENALOG) 0.025 % ointment triamcinolone acetonide 0.025 % topical ointment ? APPLY TO AFFECTED AREA TWICE DAILY FOR 2 WEEKS. IF IMPROVEMENT CAN CONTINUE DAILY.    [provider]  ?Turmeric 500 MG CAPS Take 500 mg by mouth daily. Take 500 mg by mouth daily.    [provider]  ? ? ?Allergies  ?Allergen Reactions  ? Penicillin G Other (See Comments)  ? Penicillins Other (See Comments)  ?  Causes severe, difficult to treat yeast infections  ? ? ?Gynecologic History: Patient's last menstrual period was 01/20/2011. ? ?Obstetric History: No obstetric history on file. ? ?Social History  ? ?Socioeconomic History  ? Marital status:  Married  ?  Spouse name: Belenda Cruise  ? Number of children: 3  ? Years of education: Some college  ? Highest education level: Not on file  ?Occupational History  ? 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  Employer: [employer]

## 2021-08-08 ENCOUNTER — Other Ambulatory Visit: Payer: Self-pay | Admitting: Family

## 2021-08-12 ENCOUNTER — Telehealth: Payer: Self-pay | Admitting: Genetic Counselor

## 2021-08-12 NOTE — Telephone Encounter (Signed)
Scheduled appt per 3/15 referral. Pt is aware of appt date and time. Pt is aware to arrive 15 mins prior to appt time and to bring and updated insurance card. Pt is aware of appt location.   ?

## 2021-08-13 ENCOUNTER — Other Ambulatory Visit: Payer: Self-pay

## 2021-08-13 ENCOUNTER — Inpatient Hospital Stay: Payer: 59 | Attending: Hematology & Oncology

## 2021-08-13 DIAGNOSIS — D75839 Thrombocytosis, unspecified: Secondary | ICD-10-CM | POA: Insufficient documentation

## 2021-08-13 DIAGNOSIS — D508 Other iron deficiency anemias: Secondary | ICD-10-CM

## 2021-08-13 LAB — CBC WITH DIFFERENTIAL (CANCER CENTER ONLY)
Abs Immature Granulocytes: 0.03 10*3/uL (ref 0.00–0.07)
Basophils Absolute: 0.1 10*3/uL (ref 0.0–0.1)
Basophils Relative: 1 %
Eosinophils Absolute: 0.2 10*3/uL (ref 0.0–0.5)
Eosinophils Relative: 2 %
HCT: 40.1 % (ref 36.0–46.0)
Hemoglobin: 13.6 g/dL (ref 12.0–15.0)
Immature Granulocytes: 0 %
Lymphocytes Relative: 51 %
Lymphs Abs: 4.1 10*3/uL — ABNORMAL HIGH (ref 0.7–4.0)
MCH: 29.5 pg (ref 26.0–34.0)
MCHC: 33.9 g/dL (ref 30.0–36.0)
MCV: 87 fL (ref 80.0–100.0)
Monocytes Absolute: 0.5 10*3/uL (ref 0.1–1.0)
Monocytes Relative: 6 %
Neutro Abs: 3.2 10*3/uL (ref 1.7–7.7)
Neutrophils Relative %: 40 %
Platelet Count: 473 10*3/uL — ABNORMAL HIGH (ref 150–400)
RBC: 4.61 MIL/uL (ref 3.87–5.11)
RDW: 13.4 % (ref 11.5–15.5)
WBC Count: 8 10*3/uL (ref 4.0–10.5)
nRBC: 0 % (ref 0.0–0.2)

## 2021-08-13 LAB — IRON AND IRON BINDING CAPACITY (CC-WL,HP ONLY)
Iron: 87 ug/dL (ref 28–170)
Saturation Ratios: 25 % (ref 10.4–31.8)
TIBC: 344 ug/dL (ref 250–450)
UIBC: 257 ug/dL (ref 148–442)

## 2021-08-14 LAB — FERRITIN: Ferritin: 50 ng/mL (ref 11–307)

## 2021-08-20 ENCOUNTER — Encounter: Payer: Self-pay | Admitting: Family

## 2021-08-20 LAB — JAK 2 V617F (GENPATH)

## 2021-08-21 ENCOUNTER — Other Ambulatory Visit: Payer: Self-pay | Admitting: Family

## 2021-08-21 ENCOUNTER — Telehealth: Payer: Self-pay | Admitting: Family

## 2021-08-21 DIAGNOSIS — D75839 Thrombocytosis, unspecified: Secondary | ICD-10-CM

## 2021-08-21 NOTE — Telephone Encounter (Signed)
I spoke with the patient about recent lab work and JAK 2 testing Tier III: Varient of unknown clinical significance DNMT3A [id number].  ?She is fatigued and notes palpitations at times. She states that overall she just does not feel right.  ?She restarted her vitamin D supplement with PCP. Iron studies stable and no infusion needed at this time.  ?We will go ahead and get an [place] of her abdomen to assess her liver and spleen.  ?She has seen cardiology in the past and will discuss returning to see them with her PCP at follow-up. No other questions at this time. Patient in agreement with the plan and appreciative of call.  ?

## 2021-08-22 ENCOUNTER — Ambulatory Visit (HOSPITAL_BASED_OUTPATIENT_CLINIC_OR_DEPARTMENT_OTHER)
Admission: RE | Admit: 2021-08-22 | Discharge: 2021-08-22 | Disposition: A | Discharge status: Home / Self Care | Payer: 59 | Source: Ambulatory Visit | Attending: Family | Admitting: Family

## 2021-08-22 DIAGNOSIS — D75839 Thrombocytosis, unspecified: Secondary | ICD-10-CM | POA: Diagnosis not present

## 2021-08-22 DIAGNOSIS — R5383 Other fatigue: Secondary | ICD-10-CM | POA: Diagnosis not present

## 2021-08-23 ENCOUNTER — Encounter: Payer: Self-pay | Admitting: *Deleted

## 2021-09-12 ENCOUNTER — Inpatient Hospital Stay: Payer: 59 | Admitting: Genetic Counselor

## 2021-09-12 ENCOUNTER — Inpatient Hospital Stay: Payer: 59

## 2021-09-16 ENCOUNTER — Encounter: Payer: Self-pay | Admitting: Cardiology

## 2021-09-16 ENCOUNTER — Ambulatory Visit (INDEPENDENT_AMBULATORY_CARE_PROVIDER_SITE_OTHER): Payer: 59

## 2021-09-16 ENCOUNTER — Ambulatory Visit: Payer: 59 | Admitting: Cardiology

## 2021-09-16 VITALS — BP 146/66 | HR 86 | Ht 63.0 in | Wt 242.2 lb

## 2021-09-16 DIAGNOSIS — R001 Bradycardia, unspecified: Secondary | ICD-10-CM

## 2021-09-16 DIAGNOSIS — R0602 Shortness of breath: Secondary | ICD-10-CM | POA: Diagnosis not present

## 2021-09-16 DIAGNOSIS — R011 Cardiac murmur, unspecified: Secondary | ICD-10-CM

## 2021-09-16 DIAGNOSIS — I10 Essential (primary) hypertension: Secondary | ICD-10-CM

## 2021-09-16 DIAGNOSIS — R079 Chest pain, unspecified: Secondary | ICD-10-CM | POA: Diagnosis not present

## 2021-09-16 NOTE — Progress Notes (Signed)
?Cardiology Office Note:   ? ?Date:  09/17/2021  ? ?ID:  [name], DOB 10/03/1965, MRN [id number] ? ?PCP:  Lesleigh Noe, MD  ?Cardiologist:  Berniece Salines, DO  ?Electrophysiologist:  None  ? ?Referring MD: Lesleigh Noe, MD  ? ?Chief Complaint  ?Patient presents with  ? Shortness of Breath  ?  Ongoing for a few months ?  ? ? ?History of Present Illness:   ? ?[name] is a 56 y.o. female with a hx of COPD, mild thrombocytosis, iron deficiency anemia, secondary to absorption after bariatric surgery in 2012, concern for mixed connective tissue disorder, sleep apnea no longer on CPAP, hypertension, irritable bowel syndrome and obesity. ? ?The patient is known to our practice and had seen Dr. Candee Furbish and Leanor Kail, [place] -during those visits the focus was on significant fatigue from the patient as well as transient bradycardia.  In addition in 2015 she had some work-up with an echocardiogram was within normal limits.  She also did wear ambulatory monitoring in 2019 no A-fib was seen but there was occasional transient bradycardia with no pulses. ? ?In the interim she tells me she has had these intermittent bradycardia.  But recently what has been progressing is the fact that she is short of breath on minimal exertion.  She has always been active and she still is active.  But she is not tolerating activity given the fact that her shortness of breath comes on shortly on exertion.  Symptoms even conversational.  This is concerning for the patient. ? ?She also tells me that she has had some intermittent left-sided chest sensation which feels more like a cramping sensation that comes off and off.  She quantifies this as a 2 out of 10 symptoms 4.  Nothing makes it better or worse.  She also tells me she is experiencing great deal of fatigue especially during the time her heart rates are transiently in the 40s. ? ?Past Medical History:  ?Diagnosis Date  ? Allergic rhinitis, cause unspecified   ?  Allergy 2016  ? Anemia, iron deficiency   ? Arthritis   ? COVID-19 06/15/2020  ? Diastolic dysfunction   ? Diverticulosis of colon   ? GERD (gastroesophageal reflux disease)   ? Goiter   ? Hiatal hernia   ? Hypertension   ? IBS (irritable bowel syndrome)   ? Morbid obesity ([place])   ? Obstructive sleep apnea   ? Sleep apnea   ? Thrombocytosis   ? Undiagnosed cardiac murmurs   ? ? ?Past Surgical History:  ?Procedure Laterality Date  ? ABDOMINAL HYSTERECTOMY  05/2013  ? BARIATRIC SURGERY  06/17/2010  ? BILATERAL SALPINGECTOMY Bilateral 06/22/2014  ? Procedure: BILATERAL SALPINGECTOMY;  Surgeon: Crawford Givens, MD;  Location: [facility];  Service: Gynecology;  Laterality: Bilateral;  ? CYSTOSCOPY N/A 06/22/2014  ? Procedure: CYSTOSCOPY;  Surgeon: Crawford Givens, MD;  Location: [facility];  Service: Gynecology;  Laterality: N/A;  ? ENDOMETRIAL ABLATION  12/08  ? FOOT SURGERY Left 11/24/2017  ? Dr Paulla Dolly  ? LAPAROSCOPIC ASSISTED VAGINAL HYSTERECTOMY N/A 06/22/2014  ? Procedure: LAPAROSCOPIC ASSISTED VAGINAL HYSTERECTOMY;  Surgeon: Crawford Givens, MD;  Location: [facility];  Service: Gynecology;  Laterality: N/A;  ? LIPOMA EXCISION    ? upper back  ? SPINE SURGERY  02/10/2012  ? L4-l5 fusion--cohen  ? ? ?Current Medications: ?Current Meds  ?Medication Sig  ? Biotin 10 MG CAPS every other day.   ? celecoxib (CELEBREX) 200 MG capsule  Take 1 capsule by mouth 2 (two) times daily.  ? cholecalciferol (VITAMIN D) 25 MCG (1000 UNIT) tablet Take 1,000 Units by mouth daily.  ? Cholecalciferol 1.25 MG (50000 UT) TABS Take 1 tablet by mouth once a week.  ? fluticasone (FLONASE) 50 MCG/ACT nasal spray Place 2 sprays into both nostrils daily.  ? Glucosamine 500 MG CAPS Take 1 capsule by mouth daily.  ? hydrochlorothiazide (HYDRODIURIL) 25 MG tablet TAKE 1 TABLET (25 MG TOTAL) BY MOUTH DAILY.  ? Ibuprofen 200 MG CAPS Take 600 mg by mouth daily at 12 noon.  ? ipratropium (ATROVENT) 0.03 % nasal spray PLACE 1 SPRAY INTO BOTH NOSTRILS 2 (TWO) TIMES DAILY.  (Patient taking differently: Place 1 spray into both nostrils as needed.)  ? KLOR-CON M20 20 MEQ tablet TAKE 1 TABLET BY MOUTH EVERY DAY  ? levothyroxine (SYNTHROID) 88 MCG tablet TAKE 1 TABLET BY MOUTH EVERY DAY  ? loratadine (CLARITIN) 10 MG tablet TAKE 1 TABLET BY MOUTH EVERY DAY (Patient taking differently: Take 10 mg by mouth as needed.)  ? losartan (COZAAR) 100 MG tablet TAKE 1 TABLET BY MOUTH EVERY DAY  ? Magnesium 250 MG TABS Take 1 tablet by mouth as needed. For leg cramps  ? omeprazole (PRILOSEC) 40 MG capsule Take 1 capsule (40 mg total) by mouth daily. (Patient taking differently: Take 40 mg by mouth as needed.)  ? triamcinolone (KENALOG) 0.025 % ointment triamcinolone acetonide 0.025 % topical ointment ? APPLY TO AFFECTED AREA TWICE DAILY FOR 2 WEEKS. IF IMPROVEMENT CAN CONTINUE DAILY.  ? Turmeric 500 MG CAPS Take 500 mg by mouth daily. Take 500 mg by mouth daily.  ?  ? ?Allergies:   Penicillin g and Penicillins  ? ?Social History  ? ?Socioeconomic History  ? Marital status: Married  ?  Spouse name: Belenda Cruise  ? Number of children: 3  ? Years of education: Some college  ? Highest education level: Not on file  ?Occupational History  ? 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  Employer: UNEMPLOYED  ?Tobacco Use  ? Smoking status: Never  ? Smokeless tobacco: Never  ? Tobacco comments:  ?  Never Used Tobacco  ?Vaping Use  ? Vaping Use: Never used  ?Substance and Sexual Activity  ? Alcohol use: No  ?  Alcohol/week: 0.0 standard drinks  ? Drug use: No  ? Sexual activity: Yes  ?  Partners: Male  ?  Comment: Hysterectomy  ?Other Topics Concern  ? Not on file  ?Social History Narrative  ? 09/21/19  ? From: [facility], [place] originally  ? Living: with husband, Belenda Cruise and 2 daughters w/ family  ? Work: homemaker  ?   ? Family: 3 children - [place], [place], and [employer]. Has 8 grandchildren and one on the way  ?   ? Enjoys: spend time with family, shopping  ?   ? Exercise: trying to walk more  ? Diet: eats late at night, will go most  of the day w/o eating  ?   ? Safety  ? Seat belts: Yes   ? Guns: Yes  and secure  ? Safe in relationships: Yes   ? ?Social Determinants of Health  ? ?Financial Resource Strain: Not on file  ?Food Insecurity: Not on file  ?Transportation Needs: Not on file  ?Physical Activity: Not on file  ?Stress: Not on file  ?Social Connections: Not on file  ?  ? ?Family History: ?The patient's family history includes Arrhythmia in her mother; Arthritis in her mother; Breast  cancer (age of onset: 58) in her maternal aunt; Breast cancer (age of onset: 33) in her mother; Colon polyps in her father; Diabetes in her mother; Heart disease in her mother; Hyperlipidemia in her mother; Hypertension in her father and mother; Kidney cancer in her father; Lymphoma in her father; Ovarian cancer in her paternal grandmother; Stroke (age of onset: 22) in her maternal grandmother. There is no history of Colon cancer. ? ?ROS:   ?Review of Systems  ?Constitution: Negative for decreased appetite, fever and weight gain.  ?HENT: Negative for congestion, ear discharge, hoarse voice and sore throat.   ?Eyes: Negative for discharge, redness, vision loss in right eye and visual halos.  ?Cardiovascular: Reports chest pain, dyspnea on exertion.  Negative for, leg swelling, orthopnea and palpitations.  ?Respiratory: Negative for cough, hemoptysis, shortness of breath and snoring.   ?Endocrine: Negative for heat intolerance and polyphagia.  ?Hematologic/Lymphatic: Negative for bleeding problem. Does not bruise/bleed easily.  ?Skin: Negative for flushing, nail changes, rash and suspicious lesions.  ?Musculoskeletal: Negative for arthritis, joint pain, muscle cramps, myalgias, neck pain and stiffness.  ?Gastrointestinal: Negative for abdominal pain, bowel incontinence, diarrhea and excessive appetite.  ?Genitourinary: Negative for decreased libido, genital sores and incomplete emptying.  ?Neurological: Negative for brief paralysis, focal weakness, headaches and  loss of balance.  ?Psychiatric/Behavioral: Negative for altered mental status, depression and suicidal ideas.  ?Allergic/Immunologic: Negative for HIV exposure and persistent infections.  ? ? ?EKGs/Labs/Othe

## 2021-09-16 NOTE — Progress Notes (Unsigned)
Enrolled for [employer] to mail a ZIO XT long term holter monitor to the patients address on file.  

## 2021-09-16 NOTE — Patient Instructions (Addendum)
Medication Instructions:  ?Your physician recommends that you continue on your current medications as directed. Please refer to the Current Medication list given to you today.  ?*If you need a refill on your cardiac medications before your next appointment, please call your pharmacy* ? ? ?Lab Work: ?None ?If you have labs (blood work) drawn today and your tests are completely normal, you will receive your results only by: ?MyChart Message (if you have MyChart) OR ?A paper copy in the mail ?If you have any lab test that is abnormal or we need to change your treatment, we will call you to review the results. ? ? ?Testing/Procedures: ?Your physician has requested that you have an echocardiogram. Echocardiography is a painless test that uses sound waves to create images of your heart. It provides your doctor with information about the size and shape of your heart and how well your heart?s chambers and valves are working. This procedure takes approximately one hour. There are no restrictions for this procedure. ? ?ZIO XT- Long Term Monitor Instructions ? ?Your physician has requested you wear a ZIO patch monitor for 14 days.  ?This is a single patch monitor. [employer] supplies one patch monitor per enrollment. Additional ?stickers are not available. Please do not apply patch if you will be having a Nuclear Stress Test,  ?Echocardiogram, Cardiac CT, MRI, or Chest Xray during the period you would be wearing the  ?monitor. The patch cannot be worn during these tests. You cannot remove and re-apply the  ?ZIO XT patch monitor.  ?Your ZIO patch monitor will be mailed 3 day USPS to your address on file. It may take 3-5 days  ?to receive your monitor after you have been enrolled.  ?Once you have received your monitor, please review the enclosed instructions. Your monitor  ?has already been registered assigning a specific monitor serial # to you. ? ?Billing and Patient Assistance Program Information ? ?We have supplied [employer] with  any of your insurance information on file for billing purposes. ?[employer] offers a sliding scale Patient Assistance Program for patients that do not have  ?insurance, or whose insurance does not completely cover the cost of the ZIO monitor.  ?You must apply for the Patient Assistance Program to qualify for this discounted rate.  ?To apply, please call [employer] at [phone], select option 4, select option 2, ask to apply for  ?Patient Assistance Program. Theodore Demark will ask your household income, and how many people  ?are in your household. They will quote your out-of-pocket cost based on that information.  ?[employer] will also be able to set up a 6-month interest-free payment plan if needed. ? ?Applying the monitor ?  ?Shave hair from upper left chest.  ?Hold abrader disc by orange tab. Rub abrader in 40 strokes over the upper left chest as  ?indicated in your monitor instructions.  ?Clean area with 4 enclosed alcohol pads. Let dry.  ?Apply patch as indicated in monitor instructions. Patch will be placed under collarbone on left  ?side of chest with arrow pointing upward.  ?Rub patch adhesive wings for 2 minutes. Remove white label marked "1". Remove the white  ?label marked "2". Rub patch adhesive wings for 2 additional minutes.  ?While looking in a mirror, press and release button in center of patch. A small green light will  ?flash 3-4 times. This will be your only indicator that the monitor has been turned on.  ?Do not shower for the first 24 hours. You may shower after the first  24 hours.  ?Press the button if you feel a symptom. You will hear a small click. Record Date, Time and  ?Symptom in the Patient Logbook.  ?When you are ready to remove the patch, follow instructions on the last 2 pages of Patient  ?Logbook. Stick patch monitor onto the last page of Patient Logbook.  ?Place Patient Logbook in the blue and white box. Use locking tab on box and tape box closed  ?securely. The blue and white box has prepaid  postage on it. Please place it in the mailbox as  ?soon as possible. Your physician should have your test results approximately 7 days after the  ?monitor has been mailed back to [facility].  ?Call [facility] at [phone] if you have questions regarding  ?your ZIO XT patch monitor. Call them immediately if you see an orange light blinking on your  ?monitor.  ?If your monitor falls off in less than 4 days, contact our Monitor department at [phone].  ?If your monitor becomes loose or falls off after 4 days call [employer] at [phone] for  ?suggestions on securing your monitor ? ? ? ?Follow-Up: ?At [facility], you and your health needs are our priority.  As part of our continuing mission to provide you with exceptional heart care, we have created designated Provider Care Teams.  These Care Teams include your primary Cardiologist (physician) and Advanced Practice Providers (APPs -  Physician Assistants and Nurse Practitioners) who all work together to provide you with the care you need, when you need it. ? ?We recommend signing up for the patient portal called "MyChart".  Sign up information is provided on this After Visit Summary.  MyChart is used to connect with patients for Virtual Visits (Telemedicine).  Patients are able to view lab/test results, encounter notes, upcoming appointments, etc.  Non-urgent messages can be sent to your provider as well.   ?To learn more about what you can do with MyChart, go to [email or web address].   ? ?Your next appointment:   ?6 month(s) ? ?The format for your next appointment:   ?In Person ? ?Provider:   ?Berniece Salines, DO   ? ? ?Other Instructions ? ? ?Important Information About Sugar ? ? ? ? ?  ?

## 2021-09-17 DIAGNOSIS — R0602 Shortness of breath: Secondary | ICD-10-CM | POA: Insufficient documentation

## 2021-09-17 DIAGNOSIS — R079 Chest pain, unspecified: Secondary | ICD-10-CM | POA: Insufficient documentation

## 2021-09-17 DIAGNOSIS — R011 Cardiac murmur, unspecified: Secondary | ICD-10-CM | POA: Insufficient documentation

## 2021-09-22 DIAGNOSIS — R001 Bradycardia, unspecified: Secondary | ICD-10-CM | POA: Diagnosis not present

## 2021-09-27 ENCOUNTER — Ambulatory Visit (HOSPITAL_COMMUNITY): Payer: 59 | Attending: Internal Medicine

## 2021-09-27 ENCOUNTER — Other Ambulatory Visit: Payer: Self-pay | Admitting: Family Medicine

## 2021-09-27 DIAGNOSIS — R0602 Shortness of breath: Secondary | ICD-10-CM | POA: Diagnosis not present

## 2021-09-27 DIAGNOSIS — R011 Cardiac murmur, unspecified: Secondary | ICD-10-CM

## 2021-09-27 DIAGNOSIS — E559 Vitamin D deficiency, unspecified: Secondary | ICD-10-CM

## 2021-09-27 LAB — ECHOCARDIOGRAM COMPLETE
AV Mean grad: 7 mmHg
AV Peak grad: 14 mmHg
Ao pk vel: 1.87 m/s
Area-P 1/2: 3.53 cm2
S' Lateral: 2.7 cm

## 2021-10-01 ENCOUNTER — Encounter: Payer: Self-pay | Admitting: Cardiology

## 2021-10-02 ENCOUNTER — Other Ambulatory Visit (HOSPITAL_COMMUNITY): Payer: 59

## 2021-10-10 DIAGNOSIS — R001 Bradycardia, unspecified: Secondary | ICD-10-CM | POA: Diagnosis not present

## 2021-10-13 ENCOUNTER — Encounter: Payer: Self-pay | Admitting: Cardiology

## 2021-10-16 ENCOUNTER — Other Ambulatory Visit: Payer: Self-pay

## 2021-10-16 DIAGNOSIS — I455 Other specified heart block: Secondary | ICD-10-CM

## 2021-10-16 NOTE — Progress Notes (Signed)
Referral placed for EP per Dr. Terrial Rhodes request.

## 2021-10-17 ENCOUNTER — Other Ambulatory Visit: Payer: Self-pay

## 2021-10-17 DIAGNOSIS — I455 Other specified heart block: Secondary | ICD-10-CM

## 2021-10-29 ENCOUNTER — Encounter: Payer: Self-pay | Admitting: Cardiology

## 2021-10-30 ENCOUNTER — Other Ambulatory Visit: Payer: Self-pay

## 2021-10-30 DIAGNOSIS — R0609 Other forms of dyspnea: Secondary | ICD-10-CM

## 2021-10-31 ENCOUNTER — Telehealth (HOSPITAL_COMMUNITY): Payer: Self-pay | Admitting: *Deleted

## 2021-10-31 NOTE — Telephone Encounter (Signed)
Reaching out to patient to offer assistance regarding upcoming cardiac imaging study; pt verbalizes understanding of appt date/time, parking situation and where to check in, pre-test NPO status and verified current allergies; name and call back number provided for further questions should they arise  Gordy Clement RN Navigator Cardiac [place] and [facility] [phone] office [phone] cell  Patient is at times bradycardic and Dr. Harriet Masson did not wish for patient to have pre-medications for CCTA.  Patient is aware to arrive at 1pm.

## 2021-11-01 ENCOUNTER — Ambulatory Visit (HOSPITAL_COMMUNITY): Admission: RE | Admit: 2021-11-01 | Payer: 59 | Source: Ambulatory Visit

## 2021-11-06 ENCOUNTER — Other Ambulatory Visit (HOSPITAL_COMMUNITY): Payer: 59

## 2021-11-14 ENCOUNTER — Encounter: Payer: Self-pay | Admitting: Internal Medicine

## 2021-11-14 ENCOUNTER — Ambulatory Visit: Payer: 59 | Admitting: Internal Medicine

## 2021-11-14 VITALS — BP 158/84 | HR 80 | Ht 63.34 in | Wt 241.0 lb

## 2021-11-14 DIAGNOSIS — R0602 Shortness of breath: Secondary | ICD-10-CM

## 2021-11-14 DIAGNOSIS — R001 Bradycardia, unspecified: Secondary | ICD-10-CM | POA: Diagnosis not present

## 2021-11-14 NOTE — H&P (View-Only) (Signed)
HPI [name] is referred by Dr. Harriet Masson for evaluation of AV block. The patient has multiple symptoms and carries a diagnosis of mixed connective tissue disease. She has non-cardiac chest pain. She describe episodes over the past several months where she feels like a second or two goes by and she feels like she "loses a second or two". She has not had syncope. The patient has worn a cardiac monitor which demonstrates daytime pauses of over 3 seconds for which she is symptomatic and demonstrate transient 2:1 AV block as well as some higher block. She also has brief bursts of SVT lasting seconds for which she is also symptomatic. A CT scan to look at her coronary arteries is pending. Allergies  Allergen Reactions   Penicillin G Other (See Comments)   Penicillins Other (See Comments)    Causes severe, difficult to treat yeast infections     Current Outpatient Medications  Medication Sig Dispense Refill   Biotin 10 MG CAPS every other day.      celecoxib (CELEBREX) 200 MG capsule Take 1 capsule by mouth 2 (two) times daily.     cholecalciferol (VITAMIN D) 25 MCG (1000 UNIT) tablet Take 1,000 Units by mouth daily.     fluticasone (FLONASE) 50 MCG/ACT nasal spray Place 2 sprays into both nostrils daily. 16 g 6   Glucosamine 500 MG CAPS Take 1 capsule by mouth daily.     hydrochlorothiazide (HYDRODIURIL) 25 MG tablet TAKE 1 TABLET (25 MG TOTAL) BY MOUTH DAILY. 90 tablet 3   Ibuprofen 200 MG CAPS Take 600 mg by mouth daily at 12 noon.     ipratropium (ATROVENT) 0.03 % nasal spray PLACE 1 SPRAY INTO BOTH NOSTRILS 2 (TWO) TIMES DAILY. (Patient taking differently: Place 1 spray into both nostrils as needed.) 30 mL 5   KLOR-CON M20 20 MEQ tablet TAKE 1 TABLET BY MOUTH EVERY DAY 90 tablet 3   levothyroxine (SYNTHROID) 88 MCG tablet TAKE 1 TABLET BY MOUTH EVERY DAY 90 tablet 3   loratadine (CLARITIN) 10 MG tablet TAKE 1 TABLET BY MOUTH EVERY DAY (Patient taking differently: Take 10 mg by mouth as  needed.) 90 tablet 2   losartan (COZAAR) 100 MG tablet TAKE 1 TABLET BY MOUTH EVERY DAY 90 tablet 3   Magnesium 250 MG TABS Take 1 tablet by mouth as needed. For leg cramps     Misc Natural Products (YUMVS BEET ROOT-TART CHERRY) 250-0.5 MG CHEW Chew 1 tablet by mouth 3 (three) times a week.     omeprazole (PRILOSEC) 40 MG capsule Take 1 capsule (40 mg total) by mouth daily. (Patient taking differently: Take 40 mg by mouth as needed.) 90 capsule 1   Turmeric 500 MG CAPS Take 500 mg by mouth daily. Take 500 mg by mouth daily.     No current facility-administered medications for this visit.     Past Medical History:  Diagnosis Date   Allergic rhinitis, cause unspecified    Allergy 2016   Anemia, iron deficiency    Arthritis    COVID-19 99/24/2683   Diastolic dysfunction    Diverticulosis of colon    GERD (gastroesophageal reflux disease)    Goiter    Hiatal hernia    Hypertension    IBS (irritable bowel syndrome)    Morbid obesity ([place])    Obstructive sleep apnea    Sleep apnea    Thrombocytosis    Undiagnosed cardiac murmurs     ROS:   All  systems reviewed and negative except as noted in the HPI.   Past Surgical History:  Procedure Laterality Date   ABDOMINAL HYSTERECTOMY  05/2013   BARIATRIC SURGERY  06/17/2010   BILATERAL SALPINGECTOMY Bilateral 06/22/2014   Procedure: BILATERAL SALPINGECTOMY;  Surgeon: Crawford Givens, MD;  Location: [facility];  Service: Gynecology;  Laterality: Bilateral;   CYSTOSCOPY N/A 06/22/2014   Procedure: CYSTOSCOPY;  Surgeon: Crawford Givens, MD;  Location: [facility];  Service: Gynecology;  Laterality: N/A;   ENDOMETRIAL ABLATION  12/08   FOOT SURGERY Left 11/24/2017   Dr Paulla Dolly   LAPAROSCOPIC ASSISTED VAGINAL HYSTERECTOMY N/A 06/22/2014   Procedure: LAPAROSCOPIC ASSISTED VAGINAL HYSTERECTOMY;  Surgeon: Crawford Givens, MD;  Location: [facility];  Service: Gynecology;  Laterality: N/A;   LIPOMA EXCISION     upper back   SPINE SURGERY  02/10/2012   L4-l5  fusion--cohen     Family History  Problem Relation Age of Onset   Breast cancer Mother 79   Arrhythmia Mother    Heart disease Mother    Arthritis Mother    Diabetes Mother    Hyperlipidemia Mother    Hypertension Mother    Hypertension Father    Kidney cancer Father    Colon polyps Father    Lymphoma Father        Diffused Large B-cell Lymphoma   Breast cancer Maternal Aunt 52   Stroke Maternal Grandmother 85   Ovarian cancer Paternal Grandmother    Colon cancer Neg Hx      Social History   Socioeconomic History   Marital status: Married    Spouse name: Belenda Cruise   Number of children: 3   Years of education: Some college   Highest education level: Not on file  Occupational History   Occupation: Sales promotion account executive: UNEMPLOYED  Tobacco Use   Smoking status: Never   Smokeless tobacco: Never   Tobacco comments:    Never Used Tobacco  Vaping Use   Vaping Use: Never used  Substance and Sexual Activity   Alcohol use: No    Alcohol/week: 0.0 standard drinks of alcohol   Drug use: No   Sexual activity: Yes    Partners: Male    Comment: Hysterectomy  Other Topics Concern   Not on file  Social History Narrative   09/21/19   From: [place], [place] originally   Living: with husband, Belenda Cruise and 2 daughters w/ family   Work: homemaker      Family: 3 children - [place], [place], and Museum/gallery conservator. Has 8 grandchildren and one on the way      Enjoys: spend time with family, shopping      Exercise: trying to walk more   Diet: eats late at night, will go most of the day w/o eating      Safety   Seat belts: Yes    Guns: Yes  and secure   Safe in relationships: Yes    Social Determinants of Health   Financial Resource Strain: Not on file  Food Insecurity: Not on file  Transportation Needs: Not on file  Physical Activity: Not on file  Stress: Not on file  Social Connections: Not on file  Intimate Partner Violence: Not on file     BP (!) 158/84   Pulse 80   Ht 5'  3.34" (1.609 m)   Wt 241 lb (109.3 kg)   LMP 01/20/2011   SpO2 98%   BMI 42.23 kg/m   Physical Exam:  Well appearing NAD HEENT:  Unremarkable Neck:  No JVD, no thyromegally Lymphatics:  No adenopathy Back:  No CVA tenderness Lungs:  Clear with no wheezes HEART:  Regular rate rhythm, no murmurs, no rubs, no clicks Abd:  soft, positive bowel sounds, no organomegally, no rebound, no guarding Ext:  2 plus pulses, no edema, no cyanosis, no clubbing Skin:  No rashes no nodules Neuro:  CN II through XII intact, motor grossly intact  EKG - reviewed. NSR  Assess/Plan:  Transient high grade heart block for which she is symptomatic - I have discussed the treatment options. She has an indication for PPM insertion. I think watchful waiting until her symptoms worsen vs proceeding with PPM implant are both reasonable options. She will call us if she would like to proceed. Obesity - she is encouraged to lose weight. Dyspnea - this is another problem and has been present for months. If her coronary CT scan is negative, consider CPX testing. I suspect that she is deconditioned and has diastolic dysfunction.   Carleene Overlie Ambria Mayfield,MD

## 2021-11-14 NOTE — Progress Notes (Signed)
HPI [name] is referred by Dr. Harriet Masson for evaluation of AV block. The patient has multiple symptoms and carries a diagnosis of mixed connective tissue disease. She has non-cardiac chest pain. She describe episodes over the past several months where she feels like a second or two goes by and she feels like she "loses a second or two". She has not had syncope. The patient has worn a cardiac monitor which demonstrates daytime pauses of over 3 seconds for which she is symptomatic and demonstrate transient 2:1 AV block as well as some higher block. She also has brief bursts of SVT lasting seconds for which she is also symptomatic. A CT scan to look at her coronary arteries is pending. Allergies  Allergen Reactions   Penicillin G Other (See Comments)   Penicillins Other (See Comments)    Causes severe, difficult to treat yeast infections     Current Outpatient Medications  Medication Sig Dispense Refill   Biotin 10 MG CAPS every other day.      celecoxib (CELEBREX) 200 MG capsule Take 1 capsule by mouth 2 (two) times daily.     cholecalciferol (VITAMIN D) 25 MCG (1000 UNIT) tablet Take 1,000 Units by mouth daily.     fluticasone (FLONASE) 50 MCG/ACT nasal spray Place 2 sprays into both nostrils daily. 16 g 6   Glucosamine 500 MG CAPS Take 1 capsule by mouth daily.     hydrochlorothiazide (HYDRODIURIL) 25 MG tablet TAKE 1 TABLET (25 MG TOTAL) BY MOUTH DAILY. 90 tablet 3   Ibuprofen 200 MG CAPS Take 600 mg by mouth daily at 12 noon.     ipratropium (ATROVENT) 0.03 % nasal spray PLACE 1 SPRAY INTO BOTH NOSTRILS 2 (TWO) TIMES DAILY. (Patient taking differently: Place 1 spray into both nostrils as needed.) 30 mL 5   KLOR-CON M20 20 MEQ tablet TAKE 1 TABLET BY MOUTH EVERY DAY 90 tablet 3   levothyroxine (SYNTHROID) 88 MCG tablet TAKE 1 TABLET BY MOUTH EVERY DAY 90 tablet 3   loratadine (CLARITIN) 10 MG tablet TAKE 1 TABLET BY MOUTH EVERY DAY (Patient taking differently: Take 10 mg by mouth as  needed.) 90 tablet 2   losartan (COZAAR) 100 MG tablet TAKE 1 TABLET BY MOUTH EVERY DAY 90 tablet 3   Magnesium 250 MG TABS Take 1 tablet by mouth as needed. For leg cramps     Misc Natural Products (YUMVS BEET ROOT-TART CHERRY) 250-0.5 MG CHEW Chew 1 tablet by mouth 3 (three) times a week.     omeprazole (PRILOSEC) 40 MG capsule Take 1 capsule (40 mg total) by mouth daily. (Patient taking differently: Take 40 mg by mouth as needed.) 90 capsule 1   Turmeric 500 MG CAPS Take 500 mg by mouth daily. Take 500 mg by mouth daily.     No current facility-administered medications for this visit.     Past Medical History:  Diagnosis Date   Allergic rhinitis, cause unspecified    Allergy 2016   Anemia, iron deficiency    Arthritis    COVID-19 91/47/8295   Diastolic dysfunction    Diverticulosis of colon    GERD (gastroesophageal reflux disease)    Goiter    Hiatal hernia    Hypertension    IBS (irritable bowel syndrome)    Morbid obesity ([place])    Obstructive sleep apnea    Sleep apnea    Thrombocytosis    Undiagnosed cardiac murmurs     ROS:   All  systems reviewed and negative except as noted in the HPI.   Past Surgical History:  Procedure Laterality Date   ABDOMINAL HYSTERECTOMY  05/2013   BARIATRIC SURGERY  06/17/2010   BILATERAL SALPINGECTOMY Bilateral 06/22/2014   Procedure: BILATERAL SALPINGECTOMY;  Surgeon: Crawford Givens, MD;  Location: [facility];  Service: Gynecology;  Laterality: Bilateral;   CYSTOSCOPY N/A 06/22/2014   Procedure: CYSTOSCOPY;  Surgeon: Crawford Givens, MD;  Location: [facility];  Service: Gynecology;  Laterality: N/A;   ENDOMETRIAL ABLATION  12/08   FOOT SURGERY Left 11/24/2017   Dr Paulla Dolly   LAPAROSCOPIC ASSISTED VAGINAL HYSTERECTOMY N/A 06/22/2014   Procedure: LAPAROSCOPIC ASSISTED VAGINAL HYSTERECTOMY;  Surgeon: Crawford Givens, MD;  Location: [facility];  Service: Gynecology;  Laterality: N/A;   LIPOMA EXCISION     upper back   SPINE SURGERY  02/10/2012   L4-l5  fusion--cohen     Family History  Problem Relation Age of Onset   Breast cancer Mother 27   Arrhythmia Mother    Heart disease Mother    Arthritis Mother    Diabetes Mother    Hyperlipidemia Mother    Hypertension Mother    Hypertension Father    Kidney cancer Father    Colon polyps Father    Lymphoma Father        Diffused Large B-cell Lymphoma   Breast cancer Maternal Aunt 30   Stroke Maternal Grandmother 85   Ovarian cancer Paternal Grandmother    Colon cancer Neg Hx      Social History   Socioeconomic History   Marital status: Married    Spouse name: Belenda Cruise   Number of children: 3   Years of education: Some college   Highest education level: Not on file  Occupational History   Occupation: Sales promotion account executive: UNEMPLOYED  Tobacco Use   Smoking status: Never   Smokeless tobacco: Never   Tobacco comments:    Never Used Tobacco  Vaping Use   Vaping Use: Never used  Substance and Sexual Activity   Alcohol use: No    Alcohol/week: 0.0 standard drinks of alcohol   Drug use: No   Sexual activity: Yes    Partners: Male    Comment: Hysterectomy  Other Topics Concern   Not on file  Social History Narrative   09/21/19   From: [place], [place] originally   Living: with husband, Belenda Cruise and 2 daughters w/ family   Work: homemaker      Family: 3 children - [place], [place], and Museum/gallery conservator. Has 8 grandchildren and one on the way      Enjoys: spend time with family, shopping      Exercise: trying to walk more   Diet: eats late at night, will go most of the day w/o eating      Safety   Seat belts: Yes    Guns: Yes  and secure   Safe in relationships: Yes    Social Determinants of Health   Financial Resource Strain: Not on file  Food Insecurity: Not on file  Transportation Needs: Not on file  Physical Activity: Not on file  Stress: Not on file  Social Connections: Not on file  Intimate Partner Violence: Not on file     BP (!) 158/84   Pulse 80   Ht 5'  3.34" (1.609 m)   Wt 241 lb (109.3 kg)   LMP 01/20/2011   SpO2 98%   BMI 42.23 kg/m   Physical Exam:  Well appearing NAD HEENT:  Unremarkable Neck:  No JVD, no thyromegally Lymphatics:  No adenopathy Back:  No CVA tenderness Lungs:  Clear with no wheezes HEART:  Regular rate rhythm, no murmurs, no rubs, no clicks Abd:  soft, positive bowel sounds, no organomegally, no rebound, no guarding Ext:  2 plus pulses, no edema, no cyanosis, no clubbing Skin:  No rashes no nodules Neuro:  CN II through XII intact, motor grossly intact  EKG - reviewed. NSR  Assess/Plan:  Transient high grade heart block for which she is symptomatic - I have discussed the treatment options. She has an indication for PPM insertion. I think watchful waiting until her symptoms worsen vs proceeding with PPM implant are both reasonable options. She will call us if she would like to proceed. Obesity - she is encouraged to lose weight. Dyspnea - this is another problem and has been present for months. If her coronary CT scan is negative, consider CPX testing. I suspect that she is deconditioned and has diastolic dysfunction.   Carleene Overlie Derreck Wiltsey,MD

## 2021-11-14 NOTE — Patient Instructions (Addendum)
Medication Instructions:  Your physician recommends that you continue on your current medications as directed. Please refer to the Current Medication list given to you today.  Labwork: None ordered.  Testing/Procedures: None ordered.   The following dates are available for a Pacemaker implant:  July 6, 10,14,17,18,31 August 2,7,23,25,31   Any Other Special Instructions Will Be Listed Below (If Applicable).  If you need a refill on your cardiac medications before your next appointment, please call your pharmacy.   Important Information About Sugar      Pacemaker Implantation, Adult Pacemaker implantation is a procedure to place a pacemaker inside the chest. A pacemaker is a small computer that sends electrical signals to the heart and helps the heart beat normally. A pacemaker also stores information about heart rhythms. You may need pacemaker implantation if you have: A slow heartbeat (bradycardia). Loss of consciousness that happens repeatedly (syncope) or repeated episodes of dizziness or light-headedness because of an irregular heart rate. Shortness of breath (dyspnea) due to heart problems. The pacemaker usually attaches to your heart through a wire called a lead. One or two leads may be needed. There are different types of pacemakers: Transvenous pacemaker. This type is placed under the skin or muscle of your upper chest area. The lead goes through a vein in the chest area to reach the inside of the heart. Epicardial pacemaker. This type is placed under the skin or muscle of your chest or abdomen. The lead goes through your chest to the outside of the heart. Tell a health care provider about: Any allergies you have. All medicines you are taking, including vitamins, herbs, eye drops, creams, and over-the-counter medicines. Any problems you or family members have had with anesthetic medicines. Any blood or bone disorders you have. Any surgeries you have had. Any medical  conditions you have. Whether you are pregnant or may be pregnant. What are the risks? Generally, this is a safe procedure. However, problems may occur, including: Infection. Bleeding. Failure of the pacemaker or the lead. Collapse of a lung or bleeding into a lung. Blood clot inside a blood vessel with a lead. Damage to the heart. Infection inside the heart (endocarditis). Allergic reactions to medicines. What happens before the procedure? Staying hydrated Follow instructions from your health care provider about hydration, which may include: Up to 2 hours before the procedure - you may continue to drink clear liquids, such as water, clear fruit juice, black coffee, and plain tea.  Eating and drinking restrictions Follow instructions from your health care provider about eating and drinking, which may include: 8 hours before the procedure - stop eating heavy meals or foods, such as meat, fried foods, or fatty foods. 6 hours before the procedure - stop eating light meals or foods, such as toast or cereal. 6 hours before the procedure - stop drinking milk or drinks that contain milk. 2 hours before the procedure - stop drinking clear liquids. Medicines Ask your health care provider about: Changing or stopping your regular medicines. This is especially important if you are taking diabetes medicines or blood thinners. Taking medicines such as aspirin and ibuprofen. These medicines can thin your blood. Do not take these medicines unless your health care provider tells you to take them. Taking over-the-counter medicines, vitamins, herbs, and supplements. Tests You may have: A heart evaluation. This may include: An electrocardiogram (ECG). This involves placing patches on your skin to check your heart rhythm. A chest X-ray. An echocardiogram. This is a test that uses sound  waves (ultrasound) to produce an image of the heart. A cardiac rhythm monitor. This is used to record your heart rhythm  and any events for a longer period of time. Blood tests. Genetic testing. General instructions Do not use any products that contain nicotine or tobacco for at least 4 weeks before the procedure. These products include cigarettes, e-cigarettes, and chewing tobacco. If you need help quitting, ask your health care provider. Ask your health care provider: How your surgery site will be marked. What steps will be taken to help prevent infection. These steps may include: Removing hair at the surgery site. Washing skin with a germ-killing soap. Receiving antibiotic medicine. Plan to have someone take you home from the hospital or clinic. If you will be going home right after the procedure, plan to have someone with you for 24 hours. What happens during the procedure? An IV will be inserted into one of your veins. You will be given one or more of the following: A medicine to help you relax (sedative). A medicine to numb the area (local anesthetic). A medicine to make you fall asleep (general anesthetic). The next steps vary depending on the type of pacemaker you will be getting. If you are getting a transvenous pacemaker: An incision will be made in your upper chest. A pocket will be made for the pacemaker. It may be placed under the skin or between layers of muscle. The lead will be inserted into a blood vessel that goes to the heart. While X-rays are taken by an imaging machine (fluoroscopy), the lead will be advanced through the vein to the inside of your heart. The other end of the lead will be tunneled under the skin and attached to the pacemaker. If you are getting an epicardial pacemaker: An incision will be made near your ribs or breastbone (sternum) for the lead. The lead will be attached to the outside of your heart. Another incision will be made in your chest or upper abdomen to create a pocket for the pacemaker. The free end of the lead will be tunneled under the skin and attached to  the pacemaker. The transvenous or epicardial pacemaker will be tested. Imaging studies may be done to check the lead position. The incisions will be closed with stitches (sutures), adhesive strips, or skin glue. Bandages (dressings) will be placed over the incisions. The procedure may vary among health care providers and hospitals. What happens after the procedure? Your blood pressure, heart rate, breathing rate, and blood oxygen level will be monitored until you leave the hospital or clinic. You may be given antibiotics. You will be given pain medicine. An ECG and chest X-rays will be done. You may need to wear a continuous type of ECG (Holter monitor) to check your heart rhythm. Your health care provider will program the pacemaker. If you were given a sedative during the procedure, it can affect you for several hours. Do not drive or operate machinery until your health care provider says that it is safe. You will be given a pacemaker identification card. This card lists the implant date, device model, and manufacturer of your pacemaker. Summary A pacemaker is a small computer that sends electrical signals to the heart and helps the heart beat normally. There are different types of pacemakers. A pacemaker may be placed under the skin or muscle of your chest or abdomen. Follow instructions from your health care provider about eating and drinking and about taking medicines before the procedure. This information is not  intended to replace advice given to you by your health care provider. Make sure you discuss any questions you have with your health care provider. Document Revised: 01/22/2021 Document Reviewed: 04/13/2019 Elsevier Patient Education  [place].

## 2021-11-18 ENCOUNTER — Encounter: Payer: Self-pay | Admitting: Internal Medicine

## 2021-11-18 DIAGNOSIS — I455 Other specified heart block: Secondary | ICD-10-CM

## 2021-11-18 DIAGNOSIS — R001 Bradycardia, unspecified: Secondary | ICD-10-CM

## 2021-11-20 ENCOUNTER — Telehealth (HOSPITAL_COMMUNITY): Payer: Self-pay | Admitting: *Deleted

## 2021-11-20 NOTE — Telephone Encounter (Signed)
Reaching out to patient to offer assistance regarding upcoming cardiac imaging study; pt verbalizes understanding of appt date/time, parking situation and where to check in, pre-test NPO status, and verified current allergies; name and call back number provided for further questions should they arise  Gordy Clement RN Navigator Cardiac [facility] [phone] office [phone] cell  Patient to arrive at 9:30am.

## 2021-11-21 ENCOUNTER — Other Ambulatory Visit: Payer: 59

## 2021-11-21 ENCOUNTER — Ambulatory Visit (HOSPITAL_COMMUNITY)
Admission: RE | Admit: 2021-11-21 | Discharge: 2021-11-21 | Disposition: A | Discharge status: Home / Self Care | Payer: 59 | Source: Ambulatory Visit | Attending: Cardiology | Admitting: Cardiology

## 2021-11-21 DIAGNOSIS — R0609 Other forms of dyspnea: Secondary | ICD-10-CM | POA: Insufficient documentation

## 2021-11-21 DIAGNOSIS — I455 Other specified heart block: Secondary | ICD-10-CM | POA: Diagnosis not present

## 2021-11-21 DIAGNOSIS — R001 Bradycardia, unspecified: Secondary | ICD-10-CM

## 2021-11-21 DIAGNOSIS — I700 Atherosclerosis of aorta: Secondary | ICD-10-CM | POA: Diagnosis not present

## 2021-11-21 LAB — CBC WITH DIFFERENTIAL/PLATELET
Basophils Absolute: 0.1 10*3/uL (ref 0.0–0.2)
Basos: 1 %
EOS (ABSOLUTE): 0.1 10*3/uL (ref 0.0–0.4)
Eos: 1 %
Hematocrit: 35.7 % (ref 34.0–46.6)
Hemoglobin: 11.9 g/dL (ref 11.1–15.9)
Immature Grans (Abs): 0 10*3/uL (ref 0.0–0.1)
Immature Granulocytes: 0 %
Lymphocytes Absolute: 3.5 10*3/uL — ABNORMAL HIGH (ref 0.7–3.1)
Lymphs: 40 %
MCH: 28.9 pg (ref 26.6–33.0)
MCHC: 33.3 g/dL (ref 31.5–35.7)
MCV: 87 fL (ref 79–97)
Monocytes Absolute: 0.5 10*3/uL (ref 0.1–0.9)
Monocytes: 5 %
Neutrophils Absolute: 4.6 10*3/uL (ref 1.4–7.0)
Neutrophils: 53 %
Platelets: 452 10*3/uL — ABNORMAL HIGH (ref 150–450)
RBC: 4.12 x10E6/uL (ref 3.77–5.28)
RDW: 13.1 % (ref 11.7–15.4)
WBC: 8.7 10*3/uL (ref 3.4–10.8)

## 2021-11-21 LAB — BASIC METABOLIC PANEL
BUN/Creatinine Ratio: 20 (ref 9–23)
BUN: 13 mg/dL (ref 6–24)
CO2: 24 mmol/L (ref 20–29)
Calcium: 9.5 mg/dL (ref 8.7–10.2)
Chloride: 98 mmol/L (ref 96–106)
Creatinine, Ser: 0.66 mg/dL (ref 0.57–1.00)
Glucose: 113 mg/dL — ABNORMAL HIGH (ref 70–99)
Potassium: 4.1 mmol/L (ref 3.5–5.2)
Sodium: 135 mmol/L (ref 134–144)
eGFR: 103 mL/min/{1.73_m2} (ref >59)

## 2021-11-21 MED ORDER — IOHEXOL 350 MG/ML SOLN
100.0000 mL | Freq: Once | INTRAVENOUS | Status: AC | PRN
  Administered 2021-11-21: 100 mL via INTRAVENOUS

## 2021-11-21 MED ORDER — NITROGLYCERIN 0.4 MG SL SUBL
0.8000 mg | SUBLINGUAL_TABLET | Freq: Once | SUBLINGUAL | Status: AC
  Administered 2021-11-21: 0.8 mg via SUBLINGUAL

## 2021-11-21 MED ORDER — NITROGLYCERIN 0.4 MG SL SUBL
SUBLINGUAL_TABLET | SUBLINGUAL | Status: AC
  Filled 2021-11-21: qty 2

## 2021-11-21 NOTE — Progress Notes (Signed)
CT scan completed. Tolarated well. D/C home ambulatory, awake and alert. In no distress

## 2021-11-29 ENCOUNTER — Institutional Professional Consult (permissible substitution): Payer: 59 | Admitting: Internal Medicine

## 2021-12-06 MED ORDER — VANCOMYCIN HCL 1500 MG/300ML IV SOLN
1500.0000 mg | INTRAVENOUS | Status: AC
  Administered 2021-12-09: 1500 mg via INTRAVENOUS
  Filled 2021-12-06 (×3): qty 300

## 2021-12-06 NOTE — Pre-Procedure Instructions (Signed)
Instructed patient on the following items: Arrival time 0930 Nothing to eat or drink after midnight No meds AM of procedure Responsible person to drive you home and stay with you for 24 hrs Wash with special soap night before and morning of procedure  

## 2021-12-09 ENCOUNTER — Ambulatory Visit (HOSPITAL_COMMUNITY): Payer: 59

## 2021-12-09 ENCOUNTER — Ambulatory Visit (HOSPITAL_COMMUNITY)
Admission: RE | Admit: 2021-12-09 | Discharge: 2021-12-09 | Disposition: A | Discharge status: Home / Self Care | Payer: 59 | Source: Ambulatory Visit | Attending: Internal Medicine | Admitting: Internal Medicine

## 2021-12-09 ENCOUNTER — Encounter (HOSPITAL_COMMUNITY)
Admission: RE | Disposition: A | Discharge status: Home / Self Care | Payer: Self-pay | Source: Ambulatory Visit | Attending: Internal Medicine

## 2021-12-09 ENCOUNTER — Other Ambulatory Visit: Payer: Self-pay

## 2021-12-09 DIAGNOSIS — I495 Sick sinus syndrome: Secondary | ICD-10-CM | POA: Insufficient documentation

## 2021-12-09 DIAGNOSIS — I442 Atrioventricular block, complete: Secondary | ICD-10-CM | POA: Insufficient documentation

## 2021-12-09 DIAGNOSIS — R0600 Dyspnea, unspecified: Secondary | ICD-10-CM | POA: Insufficient documentation

## 2021-12-09 DIAGNOSIS — Z950 Presence of cardiac pacemaker: Secondary | ICD-10-CM | POA: Diagnosis not present

## 2021-12-09 DIAGNOSIS — Z6841 Body Mass Index (BMI) 40.0 and over, adult: Secondary | ICD-10-CM | POA: Insufficient documentation

## 2021-12-09 HISTORY — PX: PACEMAKER IMPLANT: EP1218

## 2021-12-09 SURGERY — PACEMAKER IMPLANT

## 2021-12-09 MED ORDER — HEPARIN (PORCINE) IN NACL 1000-0.9 UT/500ML-% IV SOLN
INTRAVENOUS | Status: DC | PRN
  Administered 2021-12-09: 500 mL

## 2021-12-09 MED ORDER — IODIXANOL 320 MG/ML IV SOLN
INTRAVENOUS | Status: DC | PRN
  Administered 2021-12-09: 15 mL
  Administered 2021-12-09: 10 mL

## 2021-12-09 MED ORDER — POVIDONE-IODINE 10 % EX SWAB
2.0000 | Freq: Once | CUTANEOUS | Status: AC
  Administered 2021-12-09: 2 via TOPICAL

## 2021-12-09 MED ORDER — MIDAZOLAM HCL 5 MG/5ML IJ SOLN
INTRAMUSCULAR | Status: AC
  Filled 2021-12-09: qty 5

## 2021-12-09 MED ORDER — ACETAMINOPHEN 325 MG PO TABS
325.0000 mg | ORAL_TABLET | ORAL | Status: DC | PRN
  Administered 2021-12-09: 650 mg via ORAL
  Filled 2021-12-09: qty 2

## 2021-12-09 MED ORDER — MIDAZOLAM HCL 5 MG/5ML IJ SOLN
INTRAMUSCULAR | Status: DC | PRN
  Administered 2021-12-09 (×2): 1 mg via INTRAVENOUS
  Administered 2021-12-09: 2 mg via INTRAVENOUS
  Administered 2021-12-09 (×3): 1 mg via INTRAVENOUS
  Administered 2021-12-09: 2 mg via INTRAVENOUS

## 2021-12-09 MED ORDER — HEPARIN (PORCINE) IN NACL 1000-0.9 UT/500ML-% IV SOLN
INTRAVENOUS | Status: AC
  Filled 2021-12-09: qty 500

## 2021-12-09 MED ORDER — FENTANYL CITRATE (PF) 100 MCG/2ML IJ SOLN
INTRAMUSCULAR | Status: DC | PRN
  Administered 2021-12-09: 12.5 ug via INTRAVENOUS
  Administered 2021-12-09: 25 ug via INTRAVENOUS
  Administered 2021-12-09: 12.5 ug via INTRAVENOUS
  Administered 2021-12-09: 25 ug via INTRAVENOUS
  Administered 2021-12-09: 12.5 ug via INTRAVENOUS

## 2021-12-09 MED ORDER — SODIUM CHLORIDE 0.9 % IV SOLN: INTRAVENOUS | Status: DC

## 2021-12-09 MED ORDER — LIDOCAINE HCL (PF) 1 % IJ SOLN
INTRAMUSCULAR | Status: AC
  Filled 2021-12-09: qty 30

## 2021-12-09 MED ORDER — FENTANYL CITRATE (PF) 100 MCG/2ML IJ SOLN
INTRAMUSCULAR | Status: AC
  Filled 2021-12-09: qty 2

## 2021-12-09 MED ORDER — CHLORHEXIDINE GLUCONATE 4 % EX LIQD
4.0000 | Freq: Once | CUTANEOUS | Status: DC
  Filled 2021-12-09: qty 60

## 2021-12-09 MED ORDER — ONDANSETRON HCL 4 MG/2ML IJ SOLN
4.0000 mg | Freq: Four times a day (QID) | INTRAMUSCULAR | Status: DC | PRN
Start: 2021-12-09 — End: 2021-12-09

## 2021-12-09 MED ORDER — LIDOCAINE HCL (PF) 1 % IJ SOLN
INTRAMUSCULAR | Status: DC | PRN
  Administered 2021-12-09: 60 mL

## 2021-12-09 MED ORDER — SODIUM CHLORIDE 0.9 % IV SOLN
INTRAVENOUS | Status: AC
  Filled 2021-12-09: qty 2

## 2021-12-09 MED ORDER — SODIUM CHLORIDE 0.9 % IV SOLN
80.0000 mg | INTRAVENOUS | Status: AC
  Administered 2021-12-09: 80 mg

## 2021-12-09 SURGICAL SUPPLY — 15 items
CABLE SURGICAL S-101-97-12 (CABLE) ×2 IMPLANT
GUIDEWIRE ANGLED .035X150CM (WIRE) ×1 IMPLANT
KIT MICROPUNCTURE NIT STIFF (SHEATH) ×1 IMPLANT
LEAD INGEVITY 7841 52 (Lead) ×1 IMPLANT
LEAD INGEVITY 7842 59 (Lead) ×1 IMPLANT
MAT PREVALON FULL STRYKER (MISCELLANEOUS) ×1 IMPLANT
PACEMAKER ACCOLADE DR-EL (Pacemaker) ×1 IMPLANT
PAD DEFIB RADIO PHYSIO CONN (PAD) ×2 IMPLANT
SHEATH 7FR PRELUDE SNAP 13 (SHEATH) ×2 IMPLANT
SHEATH 7FR PRELUDE SNAP 25 (SHEATH) ×2 IMPLANT
SHEATH 8FR PRELUDE SNAP 13 (SHEATH) IMPLANT
SHEATH PINNACLE R/O II 5F 6CM (SHEATH) ×1 IMPLANT
SHEATH PROBE COVER 6X72 (BAG) ×1 IMPLANT
TRAY PACEMAKER INSERTION (PACKS) ×2 IMPLANT
WIRE HI TORQ VERSACORE-J 145CM (WIRE) ×1 IMPLANT

## 2021-12-09 NOTE — Interval H&P Note (Signed)
History and Physical Interval Note:  12/09/2021 9:32 AM  [name]  has presented today for surgery, with the diagnosis of bradicardia.  The various methods of treatment have been discussed with the patient and family. After consideration of risks, benefits and other options for treatment, the patient has consented to  Procedure(s): PACEMAKER IMPLANT (N/A) as a surgical intervention.  The patient's history has been reviewed, patient examined, no change in status, stable for surgery.  I have reviewed the patient's chart and labs.  Questions were answered to the patient's satisfaction.     Cristopher Peru

## 2021-12-09 NOTE — Discharge Instructions (Signed)
    Supplemental Discharge Instructions for  Pacemaker/Defibrillator Patients  Tomorrow, 12/10/21, send in a device transmission  Activity No heavy lifting or vigorous activity with your left/right arm for 6 to 8 weeks.  Do not raise your left/right arm above your head for one week.  Gradually raise your affected arm as drawn below.             12/14/21                    12/15/21                    12/16/21                   12/17/21 __  NO DRIVING for  1 week   ; you may begin driving on   7/62/83  .  WOUND CARE Keep the wound area clean and dry.  Do not get this area wet , no showers for one week; you may shower on   12/17/21  . Tomorrow, 12/10/21, remove the arm sling Tomorrow, 12/10/21 remove the LARGE outer plastic bandage.  Underneath the plastic bandage there are steri strips (paper tapes), DO NOT remove these. The tape/steri-strips on your wound will fall off; do not pull them off.  No bandage is needed on the site.  DO  NOT apply any creams, oils, or ointments to the wound area. If you notice any drainage or discharge from the wound, any swelling or bruising at the site, or you develop a fever > 101? F after you are discharged home, call the office at once.  Special Instructions You are still able to use cellular telephones; use the ear opposite the side where you have your pacemaker/defibrillator.  Avoid carrying your cellular phone near your device. When traveling through airports, show security personnel your identification card to avoid being screened in the metal detectors.  Ask the security personnel to use the hand wand. Avoid arc welding equipment, MRI testing (magnetic resonance imaging), TENS units (transcutaneous nerve stimulators).  Call the office for questions about other devices. Avoid electrical appliances that are in poor condition or are not properly grounded. Microwave ovens are safe to be near or to operate.

## 2021-12-10 ENCOUNTER — Encounter (HOSPITAL_COMMUNITY): Payer: Self-pay | Admitting: Internal Medicine

## 2021-12-10 ENCOUNTER — Telehealth: Payer: Self-pay

## 2021-12-10 NOTE — Telephone Encounter (Signed)
Follow-up after same day discharge: Implant date: 12/09/2021 MD: Cristopher Peru, MD Device: Pacemaker Location: L.Chest    Wound check visit: 12/19/2021  90 day MD follow-up: Msg sent to scheduler  Remote Transmission received:Patient to contact [place]   Dressing/sling removed: Yes  Confirm [place] restart on: NA  Spoke with patient reminded her of 10 pound lifting restrictions, patient voiced understanding, patient voiced understanding of wound check and patient to contact [place] regarding home monitor not connecting. Also reminded patient to not get dressing wet until 7 days after procedure.

## 2021-12-10 NOTE — Telephone Encounter (Signed)
-----   Message from [place], [place] sent at 12/09/2021  6:56 PM EDT ----- Same day d/c  BSCi PPM  GT

## 2021-12-11 ENCOUNTER — Encounter: Payer: Self-pay | Admitting: Family

## 2021-12-11 ENCOUNTER — Encounter: Payer: Self-pay | Admitting: Internal Medicine

## 2021-12-15 ENCOUNTER — Encounter: Payer: Self-pay | Admitting: Internal Medicine

## 2021-12-15 ENCOUNTER — Telehealth: Payer: Self-pay | Admitting: Physician Assistant

## 2021-12-15 NOTE — Telephone Encounter (Signed)
Patient paged after hours answering service due to episodes of thumping sensation.  She recently had pacemaker implantation performed on 12/09/2021 due to bradycardia and intermittent complete heart block.  Prior to that, she has a history of bursts of SVT.  I wonder if her symptom yesterday was more related to SVT bursts.  She says symptoms usually last about 1 to 2-minute each and recurred all night long.  At this morning, her symptom has resolved.  She denied any dizziness or feeling of passing out.  She is feeling well at this time.  She is scheduled to have wound check and device interrogation next Thursday.

## 2021-12-16 ENCOUNTER — Inpatient Hospital Stay: Payer: 59 | Attending: Hematology & Oncology

## 2021-12-16 ENCOUNTER — Encounter: Payer: Self-pay | Admitting: Family

## 2021-12-16 ENCOUNTER — Inpatient Hospital Stay: Payer: 59 | Admitting: Family

## 2021-12-16 ENCOUNTER — Ambulatory Visit (INDEPENDENT_AMBULATORY_CARE_PROVIDER_SITE_OTHER): Payer: 59 | Admitting: Internal Medicine

## 2021-12-16 VITALS — BP 160/68 | HR 61 | Temp 98.0°F | Resp 18 | Wt 238.0 lb

## 2021-12-16 DIAGNOSIS — D509 Iron deficiency anemia, unspecified: Secondary | ICD-10-CM | POA: Insufficient documentation

## 2021-12-16 DIAGNOSIS — I442 Atrioventricular block, complete: Secondary | ICD-10-CM

## 2021-12-16 DIAGNOSIS — D508 Other iron deficiency anemias: Secondary | ICD-10-CM

## 2021-12-16 DIAGNOSIS — D75839 Thrombocytosis, unspecified: Secondary | ICD-10-CM

## 2021-12-16 DIAGNOSIS — K909 Intestinal malabsorption, unspecified: Secondary | ICD-10-CM | POA: Diagnosis not present

## 2021-12-16 DIAGNOSIS — Z9884 Bariatric surgery status: Secondary | ICD-10-CM | POA: Insufficient documentation

## 2021-12-16 DIAGNOSIS — Z950 Presence of cardiac pacemaker: Secondary | ICD-10-CM

## 2021-12-16 LAB — CBC WITH DIFFERENTIAL (CANCER CENTER ONLY)
Abs Immature Granulocytes: 0.1 10*3/uL — ABNORMAL HIGH (ref 0.00–0.07)
Basophils Absolute: 0.1 10*3/uL (ref 0.0–0.1)
Basophils Relative: 1 %
Eosinophils Absolute: 0.1 10*3/uL (ref 0.0–0.5)
Eosinophils Relative: 1 %
HCT: 39.4 % (ref 36.0–46.0)
Hemoglobin: 12.9 g/dL (ref 12.0–15.0)
Immature Granulocytes: 1 %
Lymphocytes Relative: 42 %
Lymphs Abs: 3.7 10*3/uL (ref 0.7–4.0)
MCH: 29.1 pg (ref 26.0–34.0)
MCHC: 32.7 g/dL (ref 30.0–36.0)
MCV: 88.9 fL (ref 80.0–100.0)
Monocytes Absolute: 0.5 10*3/uL (ref 0.1–1.0)
Monocytes Relative: 6 %
Neutro Abs: 4.3 10*3/uL (ref 1.7–7.7)
Neutrophils Relative %: 49 %
Platelet Count: 437 10*3/uL — ABNORMAL HIGH (ref 150–400)
RBC: 4.43 MIL/uL (ref 3.87–5.11)
RDW: 13.5 % (ref 11.5–15.5)
WBC Count: 8.8 10*3/uL (ref 4.0–10.5)
nRBC: 0 % (ref 0.0–0.2)

## 2021-12-16 LAB — CMP (CANCER CENTER ONLY)
ALT: 17 U/L (ref 0–44)
AST: 21 U/L (ref 15–41)
Albumin: 4.6 g/dL (ref 3.5–5.0)
Alkaline Phosphatase: 128 U/L — ABNORMAL HIGH (ref 38–126)
Anion gap: 8 (ref 5–15)
BUN: 13 mg/dL (ref 6–20)
CO2: 30 mmol/L (ref 22–32)
Calcium: 9.5 mg/dL (ref 8.9–10.3)
Chloride: 99 mmol/L (ref 98–111)
Creatinine: 0.68 mg/dL (ref 0.44–1.00)
GFR, Estimated: >60 mL/min (ref >60)
Glucose, Bld: 99 mg/dL (ref 70–99)
Potassium: 3.9 mmol/L (ref 3.5–5.1)
Sodium: 137 mmol/L (ref 135–145)
Total Bilirubin: 0.4 mg/dL (ref 0.3–1.2)
Total Protein: 7.4 g/dL (ref 6.5–8.1)

## 2021-12-16 LAB — RETICULOCYTES
Immature Retic Fract: 14.1 % (ref 2.3–15.9)
RBC.: 4.42 MIL/uL (ref 3.87–5.11)
Retic Count, Absolute: 111.4 10*3/uL (ref 19.0–186.0)
Retic Ct Pct: 2.5 % (ref 0.4–3.1)

## 2021-12-16 LAB — LACTATE DEHYDROGENASE: LDH: 206 U/L — ABNORMAL HIGH (ref 98–192)

## 2021-12-16 LAB — IRON AND IRON BINDING CAPACITY (CC-WL,HP ONLY)
Iron: 100 ug/dL (ref 28–170)
Saturation Ratios: 27 % (ref 10.4–31.8)
TIBC: 367 ug/dL (ref 250–450)
UIBC: 267 ug/dL (ref 148–442)

## 2021-12-16 LAB — SAVE SMEAR(SSMR), FOR PROVIDER SLIDE REVIEW

## 2021-12-16 LAB — FERRITIN: Ferritin: 42 ng/mL (ref 11–307)

## 2021-12-16 NOTE — Telephone Encounter (Signed)
Spoke with patient, informed her that I had received the transmission and that one of her numbers for her RV lead is abnormal, patient agreeable to appointment with Dr. Lovena Le today at 4:30pm

## 2021-12-16 NOTE — Telephone Encounter (Signed)
Spoke with patient, patient is out of the house asked patient to send in a remote transmission when she gets home and have patient to call device clinic back when she sends in order to make sure transmission came through

## 2021-12-16 NOTE — Patient Instructions (Addendum)
Medication Instructions:  Your physician recommends that you continue on your current medications as directed. Please refer to the Current Medication list given to you today.  Labwork: None ordered.  Testing/Procedures:  You will get a chest xray.  Please go to:  [facility], [place], [place] [address] [phone]  Follow-Up: Your physician wants you to follow-up based on results of chest xray.  Remote monitoring is used to monitor your Pacemaker from home. This monitoring reduces the number of office visits required to check your device to one time per year. It allows [place] to keep an eye on the functioning of your device to ensure it is working properly. You are scheduled for a device check from home on 03/12/2022. You may send your transmission at any time that day. If you have a wireless device, the transmission will be sent automatically. After your physician reviews your transmission, you will receive a postcard with your next transmission date.  Any Other Special Instructions Will Be Listed Below (If Applicable).  If you need a refill on your cardiac medications before your next appointment, please call your pharmacy.   Important Information About [place]

## 2021-12-16 NOTE — Progress Notes (Signed)
HPI [name] returns today for an unscheduled visit after PPM insertion. She is a pleasant 56 yo woman with intermittent heart block who underwent PPM insertion a week ago. The procedure was made more difficult by venous occlusion. However we were able to get a wire into the vein and down into the heart and eventually got a nice result with good lead position and capture. She has had some funny sensations around her wound site, and her impedence has dropped. Today she comes in and interrogation of her device demonstrates a lack of both atrial and ventricular capture. P and R waves are low but usable. She does not have any pleuritic pain.  Allergies  Allergen Reactions   Penicillin G Other (See Comments)   Penicillins Other (See Comments)    Causes severe, difficult to treat yeast infections     Current Outpatient Medications  Medication Sig Dispense Refill   Biotin 1000 MCG tablet Take 1,000 mcg by mouth every other day.     celecoxib (CELEBREX) 200 MG capsule Take 200 mg by mouth daily as needed for mild pain or moderate pain.     Cholecalciferol (VITAMIN D3) 50 MCG (2000 UT) TABS Take 2,000 Units by mouth daily.     fluticasone (FLONASE) 50 MCG/ACT nasal spray Place 2 sprays into both nostrils daily. (Patient taking differently: Place 2 sprays into both nostrils daily as needed for allergies.) 16 g 6   Glucosamine HCl (GLUCOSAMINE PO) Take 1,000 mg by mouth daily.     hydrochlorothiazide (HYDRODIURIL) 25 MG tablet TAKE 1 TABLET (25 MG TOTAL) BY MOUTH DAILY. 90 tablet 3   Ibuprofen 200 MG CAPS Take 400 mg by mouth daily.     KLOR-CON M20 20 MEQ tablet TAKE 1 TABLET BY MOUTH EVERY DAY (Patient taking differently: Take 20 mEq by mouth 2 (two) times daily.) 90 tablet 3   levothyroxine (SYNTHROID) 88 MCG tablet TAKE 1 TABLET BY MOUTH EVERY DAY 90 tablet 3   loratadine (CLARITIN) 10 MG tablet TAKE 1 TABLET BY MOUTH EVERY DAY (Patient taking differently: Take 10 mg by mouth daily.) 90  tablet 2   losartan (COZAAR) 100 MG tablet TAKE 1 TABLET BY MOUTH EVERY DAY 90 tablet 3   Magnesium 250 MG TABS Take 250 mg by mouth daily. For leg cramps     Menthol, Topical Analgesic, (BIOFREEZE EX) Apply 1 application  topically daily as needed (On Knees and feet).     Misc Natural Products (YUMVS BEET ROOT-TART CHERRY) 250-0.5 MG CHEW Chew 1 tablet by mouth 2 (two) times a week.     omeprazole (PRILOSEC) 40 MG capsule Take 1 capsule (40 mg total) by mouth daily. (Patient taking differently: Take 20 mg by mouth daily as needed (reflux).) 90 capsule 1   Turmeric 500 MG CAPS Take 500 mg by mouth daily.     No current facility-administered medications for this visit.     Past Medical History:  Diagnosis Date   Allergic rhinitis, cause unspecified    Allergy 2016   Anemia, iron deficiency    Arthritis    COVID-19 19/14/7829   Diastolic dysfunction    Diverticulosis of colon    GERD (gastroesophageal reflux disease)    Goiter    Hiatal hernia    Hypertension    IBS (irritable bowel syndrome)    Morbid obesity ([place])    Obstructive sleep apnea    Sleep apnea    Thrombocytosis    Undiagnosed cardiac murmurs  ROS:   All systems reviewed and negative except as noted in the HPI.   Past Surgical History:  Procedure Laterality Date   ABDOMINAL HYSTERECTOMY  05/2013   BARIATRIC SURGERY  06/17/2010   BILATERAL SALPINGECTOMY Bilateral 06/22/2014   Procedure: BILATERAL SALPINGECTOMY;  Surgeon: Crawford Givens, MD;  Location: [facility];  Service: Gynecology;  Laterality: Bilateral;   CYSTOSCOPY N/A 06/22/2014   Procedure: CYSTOSCOPY;  Surgeon: Crawford Givens, MD;  Location: [facility];  Service: Gynecology;  Laterality: N/A;   ENDOMETRIAL ABLATION  12/08   FOOT SURGERY Left 11/24/2017   Dr Paulla Dolly   LAPAROSCOPIC ASSISTED VAGINAL HYSTERECTOMY N/A 06/22/2014   Procedure: LAPAROSCOPIC ASSISTED VAGINAL HYSTERECTOMY;  Surgeon: Crawford Givens, MD;  Location: [facility];  Service: Gynecology;  Laterality:  N/A;   LIPOMA EXCISION     upper back   PACEMAKER IMPLANT N/A 12/09/2021   Procedure: PACEMAKER IMPLANT;  Surgeon: Abt Lance, MD;  Location: [facility];  Service: Cardiovascular;  Laterality: N/A;   SPINE SURGERY  02/10/2012   L4-l5 fusion--cohen     Family History  Problem Relation Age of Onset   Breast cancer Mother 75   Arrhythmia Mother    Heart disease Mother    Arthritis Mother    Diabetes Mother    Hyperlipidemia Mother    Hypertension Mother    Hypertension Father    Kidney cancer Father    Colon polyps Father    Lymphoma Father        Diffused Large B-cell Lymphoma   Breast cancer Maternal Aunt 53   Stroke Maternal Grandmother 85   Ovarian cancer Paternal Grandmother    Colon cancer Neg Hx      Social History   Socioeconomic History   Marital status: Married    Spouse name: [name]   Number of children: 3   Years of education: Some college   Highest education level: Not on file  Occupational History   Occupation: Sales promotion account executive: UNEMPLOYED  Tobacco Use   Smoking status: Never   Smokeless tobacco: Never   Tobacco comments:    Never Used Tobacco  Vaping Use   Vaping Use: Never used  Substance and Sexual Activity   Alcohol use: No    Alcohol/week: 0.0 standard drinks of alcohol   Drug use: No   Sexual activity: Yes    Partners: Male    Comment: Hysterectomy  Other Topics Concern   Not on file  Social History Narrative   09/21/19   From: [place], [place] originally   Living: with husband, [name] and 2 daughters w/ family   Work: homemaker      Family: 3 children - [place], [place], and Museum/gallery conservator. Has 8 grandchildren and one on the way      Enjoys: spend time with family, shopping      Exercise: trying to walk more   Diet: eats late at night, will go most of the day w/o eating      Safety   Seat belts: Yes    Guns: Yes  and secure   Safe in relationships: Yes    Social Determinants of Health   Financial Resource Strain:  Not on file  Food Insecurity: Not on file  Transportation Needs: Not on file  Physical Activity: Not on file  Stress: Not on file  Social Connections: Not on file  Intimate Partner Violence: Not on file     LMP 01/20/2011   Physical Exam:  Well appearing middle  aged woman, NAD HEENT: Unremarkable Neck:  No JVD, no thyromegally Lymphatics:  No adenopathy Back:  No CVA tenderness Lungs:  no increased work of breathing.  HEART:  Regular rate rhythm, no murmurs, no rubs, no clicks Abd:  soft, positive bowel sounds, no organomegally, no rebound, no guarding Ext:  2 plus pulses, no edema, no cyanosis, no clubbing Skin:  No rashes no nodules Neuro:  CN II through XII intact, motor grossly intact  DEVICE  No capture in the A and V, P and R waves are sensed   Assess/Plan:  Possible A and V lead dislodgement. She will undergo CXR and will likely need a lead revision. Heart block - fortunately she is not PM dependent.   Carleene Overlie Joseguadalupe Stan,MD

## 2021-12-16 NOTE — Progress Notes (Signed)
Hematology and Oncology Follow Up Visit  [name] [id number] 11/17/1965 56 y.o. 12/16/2021   Principle Diagnosis:  Thrombocytosis Family history of lymphoma (father) Iron deficiency anemia secondary to malabsorption with bariatric surgery (2012)    Current Therapy:        Observation IV iron as indicated   Interim History:  [name] is here today for follow-up. She is doing fairly well but notes fatigue at times as well as SOB with over exertion.  She has had some issues with her new pacemaker which she had placed on 12/09/2021. She has had palpitations on the left side of her heart and has a follow-up with cardiology to reassess this week.  No fever, chills, n/v, cough, rash, abdominal pain or changes in bowel or bladder habits.  No blood loss noted. No bruising or petechiae.  No numbness or tingling in her extremities at this time.  She has generalized bone and joint aches and pains that wax and wane.  She is over due for her mammogram and will also be requesting a bone density scan.  No falls or syncope to report.  Appetite comes and goes. She is staying well hydrated throughout the day. Her weight is stable at 238 lbs.   ECOG Performance Status: 1 - Symptomatic but completely ambulatory  Medications:  Allergies as of 12/16/2021       Reactions   Penicillin G Other (See Comments)   Penicillins Other (See Comments)   Causes severe, difficult to treat yeast infections        Medication List        Accurate as of December 16, 2021 10:19 AM. If you have any questions, ask your nurse or doctor.          BIOFREEZE EX Apply 1 application  topically daily as needed (On Knees and feet).   Biotin 1000 MCG tablet Take 1,000 mcg by mouth every other day.   celecoxib 200 MG capsule Commonly known as: CELEBREX Take 200 mg by mouth daily as needed for mild pain or moderate pain.   fluticasone 50 MCG/ACT nasal spray Commonly known as: FLONASE Place 2 sprays into  both nostrils daily. What changed:  when to take this reasons to take this   GLUCOSAMINE PO Take 1,000 mg by mouth daily.   hydrochlorothiazide 25 MG tablet Commonly known as: HYDRODIURIL TAKE 1 TABLET (25 MG TOTAL) BY MOUTH DAILY.   Ibuprofen 200 MG Caps Take 400 mg by mouth daily.   [place]-Con M20 20 MEQ tablet Generic drug: potassium chloride SA TAKE 1 TABLET BY MOUTH EVERY DAY What changed:  how much to take when to take this   levothyroxine 88 MCG tablet Commonly known as: SYNTHROID TAKE 1 TABLET BY MOUTH EVERY DAY   loratadine 10 MG tablet Commonly known as: CLARITIN TAKE 1 TABLET BY MOUTH EVERY DAY   losartan 100 MG tablet Commonly known as: COZAAR TAKE 1 TABLET BY MOUTH EVERY DAY   Magnesium 250 MG Tabs Take 250 mg by mouth daily. For leg cramps   omeprazole 40 MG capsule Commonly known as: PRILOSEC Take 1 capsule (40 mg total) by mouth daily. What changed:  how much to take when to take this reasons to take this   Turmeric 500 MG Caps Take 500 mg by mouth daily.   Vitamin D3 50 MCG (2000 UT) Tabs Take 2,000 Units by mouth daily.   YumVs Beet Root-Tart Cherry 250-0.5 MG Chew Chew 1 tablet by mouth 2 (two) times  a week.        Allergies:  Allergies  Allergen Reactions   Penicillin G Other (See Comments)   Penicillins Other (See Comments)    Causes severe, difficult to treat yeast infections    Past Medical History, Surgical history, Social history, and Family History were reviewed and updated.  Review of Systems: All other 10 point review of systems is negative.   Physical Exam:  weight is 238 lb (108 kg). Her oral temperature is 98 F (36.7 C). Her blood pressure is 160/68 (abnormal) and her pulse is 61. Her respiration is 18 and oxygen saturation is 100%.   Wt Readings from Last 3 Encounters:  12/16/21 238 lb (108 kg)  12/09/21 241 lb (109.3 kg)  11/14/21 241 lb (109.3 kg)    Ocular: Sclerae unicteric, pupils equal, round and  reactive to light Ear-nose-throat: Oropharynx clear, dentition fair Lymphatic: No cervical or supraclavicular adenopathy Lungs no rales or rhonchi, good excursion bilaterally Heart regular rate and rhythm, no murmur appreciated Abd soft, nontender, positive bowel sounds MSK no focal spinal tenderness, no joint edema Neuro: non-focal, well-oriented, appropriate affect Breasts: Deferred   Lab Results  Component Value Date   WBC 8.8 12/16/2021   HGB 12.9 12/16/2021   HCT 39.4 12/16/2021   MCV 88.9 12/16/2021   PLT 437 (H) 12/16/2021   Lab Results  Component Value Date   FERRITIN 50 08/13/2021   IRON 87 08/13/2021   TIBC 344 08/13/2021   UIBC 257 08/13/2021   IRONPCTSAT 25 08/13/2021   Lab Results  Component Value Date   RETICCTPCT 2.5 12/16/2021   RBC 4.42 12/16/2021   RBC 4.43 12/16/2021   RETICCTABS 79.2 04/19/2014   No results found for: "KPAFRELGTCHN", "LAMBDASER", "KAPLAMBRATIO" No results found for: "IGGSERUM", "IGA", "IGMSERUM" No results found for: "TOTALPROTELP", "ALBUMINELP", "A1GS", "A2GS", "BETS", "BETA2SER", "GAMS", "MSPIKE", "SPEI"   Chemistry      Component Value Date/Time   NA 135 11/21/2021 1135   NA 138 04/27/2017 0933   K 4.1 11/21/2021 1135   K 3.3 (L) 04/27/2017 0933   CL 98 11/21/2021 1135   CO2 24 11/21/2021 1135   CO2 26 04/27/2017 0933   BUN 13 11/21/2021 1135   BUN 12.2 04/27/2017 0933   CREATININE 0.66 11/21/2021 1135   CREATININE 0.68 06/18/2021 0954   CREATININE 0.7 04/27/2017 0933   GLU 81 08/29/2019 0000      Component Value Date/Time   CALCIUM 9.5 11/21/2021 1135   CALCIUM 9.1 04/27/2017 0933   ALKPHOS 146 (H) 06/18/2021 0954   ALKPHOS 139 04/27/2017 0933   AST 24 06/18/2021 0954   AST 25 04/27/2017 0933   ALT 18 06/18/2021 0954   ALT 19 04/27/2017 0933   BILITOT 0.4 06/18/2021 0954   BILITOT 0.58 04/27/2017 0933       Impression and Plan: [name] is a very pleasant 56 yo African American female with history of mild  thrombocytosis as well as intermittent iron deficiency anemia secondary to malabsorption. Iron studies are pending.  Follow-up in 6 months.   Lottie Dawson, NP 7/24/202310:19 AM

## 2021-12-16 NOTE — H&P (View-Only) (Signed)
HPI [name] returns today for an unscheduled visit after PPM insertion. She is a pleasant 56 yo woman with intermittent heart block who underwent PPM insertion a week ago. The procedure was made more difficult by venous occlusion. However we were able to get a wire into the vein and down into the heart and eventually got a nice result with good lead position and capture. She has had some funny sensations around her wound site, and her impedence has dropped. Today she comes in and interrogation of her device demonstrates a lack of both atrial and ventricular capture. P and R waves are low but usable. She does not have any pleuritic pain.  Allergies  Allergen Reactions   Penicillin G Other (See Comments)   Penicillins Other (See Comments)    Causes severe, difficult to treat yeast infections     Current Outpatient Medications  Medication Sig Dispense Refill   Biotin 1000 MCG tablet Take 1,000 mcg by mouth every other day.     celecoxib (CELEBREX) 200 MG capsule Take 200 mg by mouth daily as needed for mild pain or moderate pain.     Cholecalciferol (VITAMIN D3) 50 MCG (2000 UT) TABS Take 2,000 Units by mouth daily.     fluticasone (FLONASE) 50 MCG/ACT nasal spray Place 2 sprays into both nostrils daily. (Patient taking differently: Place 2 sprays into both nostrils daily as needed for allergies.) 16 g 6   Glucosamine HCl (GLUCOSAMINE PO) Take 1,000 mg by mouth daily.     hydrochlorothiazide (HYDRODIURIL) 25 MG tablet TAKE 1 TABLET (25 MG TOTAL) BY MOUTH DAILY. 90 tablet 3   Ibuprofen 200 MG CAPS Take 400 mg by mouth daily.     KLOR-CON M20 20 MEQ tablet TAKE 1 TABLET BY MOUTH EVERY DAY (Patient taking differently: Take 20 mEq by mouth 2 (two) times daily.) 90 tablet 3   levothyroxine (SYNTHROID) 88 MCG tablet TAKE 1 TABLET BY MOUTH EVERY DAY 90 tablet 3   loratadine (CLARITIN) 10 MG tablet TAKE 1 TABLET BY MOUTH EVERY DAY (Patient taking differently: Take 10 mg by mouth daily.) 90  tablet 2   losartan (COZAAR) 100 MG tablet TAKE 1 TABLET BY MOUTH EVERY DAY 90 tablet 3   Magnesium 250 MG TABS Take 250 mg by mouth daily. For leg cramps     Menthol, Topical Analgesic, (BIOFREEZE EX) Apply 1 application  topically daily as needed (On Knees and feet).     Misc Natural Products (YUMVS BEET ROOT-TART CHERRY) 250-0.5 MG CHEW Chew 1 tablet by mouth 2 (two) times a week.     omeprazole (PRILOSEC) 40 MG capsule Take 1 capsule (40 mg total) by mouth daily. (Patient taking differently: Take 20 mg by mouth daily as needed (reflux).) 90 capsule 1   Turmeric 500 MG CAPS Take 500 mg by mouth daily.     No current facility-administered medications for this visit.     Past Medical History:  Diagnosis Date   Allergic rhinitis, cause unspecified    Allergy 2016   Anemia, iron deficiency    Arthritis    COVID-19 72/62/0355   Diastolic dysfunction    Diverticulosis of colon    GERD (gastroesophageal reflux disease)    Goiter    Hiatal hernia    Hypertension    IBS (irritable bowel syndrome)    Morbid obesity ([place])    Obstructive sleep apnea    Sleep apnea    Thrombocytosis    Undiagnosed cardiac murmurs  ROS:   All systems reviewed and negative except as noted in the HPI.   Past Surgical History:  Procedure Laterality Date   ABDOMINAL HYSTERECTOMY  05/2013   BARIATRIC SURGERY  06/17/2010   BILATERAL SALPINGECTOMY Bilateral 06/22/2014   Procedure: BILATERAL SALPINGECTOMY;  Surgeon: Crawford Givens, MD;  Location: [facility];  Service: Gynecology;  Laterality: Bilateral;   CYSTOSCOPY N/A 06/22/2014   Procedure: CYSTOSCOPY;  Surgeon: Crawford Givens, MD;  Location: [facility];  Service: Gynecology;  Laterality: N/A;   ENDOMETRIAL ABLATION  12/08   FOOT SURGERY Left 11/24/2017   Dr Paulla Dolly   LAPAROSCOPIC ASSISTED VAGINAL HYSTERECTOMY N/A 06/22/2014   Procedure: LAPAROSCOPIC ASSISTED VAGINAL HYSTERECTOMY;  Surgeon: Crawford Givens, MD;  Location: [facility];  Service: Gynecology;  Laterality:  N/A;   LIPOMA EXCISION     upper back   PACEMAKER IMPLANT N/A 12/09/2021   Procedure: PACEMAKER IMPLANT;  Surgeon: Portillo Lance, MD;  Location: [facility];  Service: Cardiovascular;  Laterality: N/A;   SPINE SURGERY  02/10/2012   L4-l5 fusion--cohen     Family History  Problem Relation Age of Onset   Breast cancer Mother 53   Arrhythmia Mother    Heart disease Mother    Arthritis Mother    Diabetes Mother    Hyperlipidemia Mother    Hypertension Mother    Hypertension Father    Kidney cancer Father    Colon polyps Father    Lymphoma Father        Diffused Large B-cell Lymphoma   Breast cancer Maternal Aunt 43   Stroke Maternal Grandmother 85   Ovarian cancer Paternal Grandmother    Colon cancer Neg Hx      Social History   Socioeconomic History   Marital status: Married    Spouse name: [name]   Number of children: 3   Years of education: Some college   Highest education level: Not on file  Occupational History   Occupation: Sales promotion account executive: UNEMPLOYED  Tobacco Use   Smoking status: Never   Smokeless tobacco: Never   Tobacco comments:    Never Used Tobacco  Vaping Use   Vaping Use: Never used  Substance and Sexual Activity   Alcohol use: No    Alcohol/week: 0.0 standard drinks of alcohol   Drug use: No   Sexual activity: Yes    Partners: Male    Comment: Hysterectomy  Other Topics Concern   Not on file  Social History Narrative   09/21/19   From: [place], [place] originally   Living: with husband, [name] and 2 daughters w/ family   Work: homemaker      Family: 3 children - [name], [name], and [name]. Has 8 grandchildren and one on the way      Enjoys: spend time with family, shopping      Exercise: trying to walk more   Diet: eats late at night, will go most of the day w/o eating      Safety   Seat belts: Yes    Guns: Yes  and secure   Safe in relationships: Yes    Social Determinants of Health   Financial Resource Strain:  Not on file  Food Insecurity: Not on file  Transportation Needs: Not on file  Physical Activity: Not on file  Stress: Not on file  Social Connections: Not on file  Intimate Partner Violence: Not on file     LMP 01/20/2011   Physical Exam:  Well appearing middle  aged woman, NAD HEENT: Unremarkable Neck:  No JVD, no thyromegally Lymphatics:  No adenopathy Back:  No CVA tenderness Lungs:  no increased work of breathing.  HEART:  Regular rate rhythm, no murmurs, no rubs, no clicks Abd:  soft, positive bowel sounds, no organomegally, no rebound, no guarding Ext:  2 plus pulses, no edema, no cyanosis, no clubbing Skin:  No rashes no nodules Neuro:  CN II through XII intact, motor grossly intact  DEVICE  No capture in the A and V, P and R waves are sensed   Assess/Plan:  Possible A and V lead dislodgement. She will undergo CXR and will likely need a lead revision. Heart block - fortunately she is not PM dependent.   Carleene Overlie Shaday Rayborn,MD

## 2021-12-17 ENCOUNTER — Encounter (HOSPITAL_COMMUNITY)
Admission: RE | Disposition: A | Discharge status: Home / Self Care | Payer: Self-pay | Source: Home / Self Care | Attending: Internal Medicine

## 2021-12-17 ENCOUNTER — Ambulatory Visit
Admission: RE | Admit: 2021-12-17 | Discharge: 2021-12-17 | Disposition: A | Discharge status: Home / Self Care | Payer: 59 | Source: Ambulatory Visit | Attending: Internal Medicine | Admitting: Internal Medicine

## 2021-12-17 ENCOUNTER — Encounter (HOSPITAL_COMMUNITY): Payer: Self-pay | Admitting: Internal Medicine

## 2021-12-17 ENCOUNTER — Telehealth: Payer: Self-pay | Admitting: Internal Medicine

## 2021-12-17 ENCOUNTER — Ambulatory Visit (HOSPITAL_COMMUNITY)
Admission: RE | Admit: 2021-12-17 | Discharge: 2021-12-18 | Disposition: A | Discharge status: Home / Self Care | Payer: 59 | Attending: Internal Medicine | Admitting: Internal Medicine

## 2021-12-17 ENCOUNTER — Other Ambulatory Visit: Payer: Self-pay

## 2021-12-17 DIAGNOSIS — T829XXA Unspecified complication of cardiac and vascular prosthetic device, implant and graft, initial encounter: Secondary | ICD-10-CM | POA: Diagnosis present

## 2021-12-17 DIAGNOSIS — I442 Atrioventricular block, complete: Secondary | ICD-10-CM | POA: Diagnosis not present

## 2021-12-17 DIAGNOSIS — Y839 Surgical procedure, unspecified as the cause of abnormal reaction of the patient, or of later complication, without mention of misadventure at the time of the procedure: Secondary | ICD-10-CM | POA: Diagnosis not present

## 2021-12-17 DIAGNOSIS — T82120A Displacement of cardiac electrode, initial encounter: Secondary | ICD-10-CM | POA: Diagnosis not present

## 2021-12-17 DIAGNOSIS — Z950 Presence of cardiac pacemaker: Secondary | ICD-10-CM | POA: Diagnosis not present

## 2021-12-17 HISTORY — PX: LEAD REVISION/REPAIR: EP1213

## 2021-12-17 SURGERY — LEAD REVISION/REPAIR

## 2021-12-17 MED ORDER — PANTOPRAZOLE SODIUM 40 MG PO TBEC
40.0000 mg | DELAYED_RELEASE_TABLET | Freq: Every day | ORAL | Status: DC
  Administered 2021-12-17 – 2021-12-18 (×2): 40 mg via ORAL
  Filled 2021-12-17 (×2): qty 1

## 2021-12-17 MED ORDER — HEPARIN (PORCINE) IN NACL 1000-0.9 UT/500ML-% IV SOLN
INTRAVENOUS | Status: DC | PRN
  Administered 2021-12-17: 500 mL

## 2021-12-17 MED ORDER — FENTANYL CITRATE (PF) 100 MCG/2ML IJ SOLN
INTRAMUSCULAR | Status: AC
  Filled 2021-12-17: qty 2

## 2021-12-17 MED ORDER — VANCOMYCIN HCL IN DEXTROSE 1-5 GM/200ML-% IV SOLN
1000.0000 mg | Freq: Once | INTRAVENOUS | Status: AC
  Administered 2021-12-18: 1000 mg via INTRAVENOUS
  Filled 2021-12-17: qty 200

## 2021-12-17 MED ORDER — SODIUM CHLORIDE 0.9 % IV SOLN
80.0000 mg | INTRAVENOUS | Status: AC
  Administered 2021-12-17: 80 mg

## 2021-12-17 MED ORDER — FENTANYL CITRATE (PF) 100 MCG/2ML IJ SOLN
INTRAMUSCULAR | Status: DC | PRN
  Administered 2021-12-17: 12.5 ug via INTRAVENOUS
  Administered 2021-12-17: 25 ug via INTRAVENOUS
  Administered 2021-12-17 (×3): 12.5 ug via INTRAVENOUS
  Administered 2021-12-17: 25 ug via INTRAVENOUS
  Administered 2021-12-17 (×2): 12.5 ug via INTRAVENOUS

## 2021-12-17 MED ORDER — VANCOMYCIN HCL IN DEXTROSE 1-5 GM/200ML-% IV SOLN
1000.0000 mg | INTRAVENOUS | Status: AC
  Administered 2021-12-17: 1000 mg via INTRAVENOUS

## 2021-12-17 MED ORDER — MIDAZOLAM HCL 5 MG/5ML IJ SOLN
INTRAMUSCULAR | Status: DC | PRN
  Administered 2021-12-17 (×4): 1 mg via INTRAVENOUS
  Administered 2021-12-17: 2 mg via INTRAVENOUS
  Administered 2021-12-17 (×2): 1 mg via INTRAVENOUS
  Administered 2021-12-17: 2 mg via INTRAVENOUS

## 2021-12-17 MED ORDER — HEPARIN (PORCINE) IN NACL 1000-0.9 UT/500ML-% IV SOLN
INTRAVENOUS | Status: AC
  Filled 2021-12-17: qty 500

## 2021-12-17 MED ORDER — MIDAZOLAM HCL 5 MG/5ML IJ SOLN
INTRAMUSCULAR | Status: AC
  Filled 2021-12-17: qty 5

## 2021-12-17 MED ORDER — IOHEXOL 350 MG/ML SOLN
INTRAVENOUS | Status: DC | PRN
  Administered 2021-12-17: 15 mL

## 2021-12-17 MED ORDER — CHLORHEXIDINE GLUCONATE 4 % EX LIQD: 4.0000 | Freq: Once | CUTANEOUS | Status: DC

## 2021-12-17 MED ORDER — SODIUM CHLORIDE 0.9 % IV SOLN
INTRAVENOUS | Status: AC
  Filled 2021-12-17: qty 2

## 2021-12-17 MED ORDER — ACETAMINOPHEN 325 MG PO TABS
325.0000 mg | ORAL_TABLET | ORAL | Status: DC | PRN
  Administered 2021-12-17 – 2021-12-18 (×3): 650 mg via ORAL
  Filled 2021-12-17 (×3): qty 2

## 2021-12-17 MED ORDER — LIDOCAINE HCL (PF) 1 % IJ SOLN
INTRAMUSCULAR | Status: DC | PRN
  Administered 2021-12-17: 60 mL

## 2021-12-17 MED ORDER — VANCOMYCIN HCL IN DEXTROSE 1-5 GM/200ML-% IV SOLN
INTRAVENOUS | Status: AC
  Filled 2021-12-17: qty 200

## 2021-12-17 MED ORDER — ONDANSETRON HCL 4 MG/2ML IJ SOLN: 4.0000 mg | Freq: Four times a day (QID) | INTRAMUSCULAR | Status: DC | PRN

## 2021-12-17 MED ORDER — LIDOCAINE HCL (PF) 1 % IJ SOLN
INTRAMUSCULAR | Status: AC
  Filled 2021-12-17: qty 60

## 2021-12-17 MED ORDER — POVIDONE-IODINE 10 % EX SWAB: 2.0000 | Freq: Once | CUTANEOUS | Status: DC

## 2021-12-17 MED ORDER — SODIUM CHLORIDE 0.9 % IV SOLN: INTRAVENOUS | Status: DC

## 2021-12-17 MED ORDER — SODIUM CHLORIDE 0.9 % IV SOLN
INTRAVENOUS | Status: DC | PRN
  Administered 2021-12-17: 80 mg

## 2021-12-17 SURGICAL SUPPLY — 10 items
CABLE SURGICAL S-101-97-12 (CABLE) ×2 IMPLANT
CATH SELECT PACE 669183 (CATHETERS) ×2 IMPLANT
CUTTER LV DELIVERY CATHETER 7 (MISCELLANEOUS) ×1 IMPLANT
GUIDEWIRE ANGLED .035X150CM (WIRE) ×1 IMPLANT
KIT MICROPUNCTURE NIT STIFF (SHEATH) ×1 IMPLANT
LEAD INGEVITY 7842 59 (Lead) ×2 IMPLANT
PAD DEFIB RADIO PHYSIO CONN (PAD) ×2 IMPLANT
SHEATH 7FR PRELUDE SNAP 13 (SHEATH) IMPLANT
SHEATH 8FR PRELUDE SNAP 13 (SHEATH) ×1 IMPLANT
TRAY PACEMAKER INSERTION (PACKS) ×2 IMPLANT

## 2021-12-17 NOTE — Telephone Encounter (Signed)
Dianne with [facility] calling in on behalf of Dr. Weber Cooks, requesting to speak with DOD regarding critical chest xray.

## 2021-12-17 NOTE — Telephone Encounter (Signed)
Dr. Curt Bears spoke w/ radiology. Lead dislodgement shown. Sonia Baller RN, Dr. Tanna Furry nurse, is going to speak w/ Dr. Lovena Le on plan for pt and will arrange lead revision

## 2021-12-18 ENCOUNTER — Ambulatory Visit (HOSPITAL_COMMUNITY): Payer: 59

## 2021-12-18 ENCOUNTER — Encounter: Payer: 59 | Admitting: Family Medicine

## 2021-12-18 ENCOUNTER — Encounter: Payer: Self-pay | Admitting: Family

## 2021-12-18 DIAGNOSIS — I442 Atrioventricular block, complete: Secondary | ICD-10-CM | POA: Diagnosis not present

## 2021-12-18 DIAGNOSIS — Y839 Surgical procedure, unspecified as the cause of abnormal reaction of the patient, or of later complication, without mention of misadventure at the time of the procedure: Secondary | ICD-10-CM | POA: Diagnosis not present

## 2021-12-18 DIAGNOSIS — T82120A Displacement of cardiac electrode, initial encounter: Secondary | ICD-10-CM | POA: Diagnosis not present

## 2021-12-18 DIAGNOSIS — Z950 Presence of cardiac pacemaker: Secondary | ICD-10-CM | POA: Diagnosis not present

## 2021-12-18 DIAGNOSIS — T829XXA Unspecified complication of cardiac and vascular prosthetic device, implant and graft, initial encounter: Secondary | ICD-10-CM

## 2021-12-18 DIAGNOSIS — J9811 Atelectasis: Secondary | ICD-10-CM | POA: Diagnosis not present

## 2021-12-18 MED ORDER — ACETAMINOPHEN 325 MG PO TABS: 325.0000 mg | ORAL_TABLET | ORAL | Status: DC | PRN

## 2021-12-18 NOTE — Discharge Instructions (Signed)
After Your Lead Revision  Do not lift your arm above shoulder height for 1 week after your procedure. After 7 days, you may progress as below.  You should remove your sling 24 hours after your procedure, unless otherwise instructed by your provider.     Wednesday December 25, 2021  Thursday December 26, 2021 Friday December 27, 2021 Saturday December 28, 2021   Do not lift, push, pull, or carry anything over 10 pounds with the affected arm until 6 weeks (Wednesday January 29, 2022) after your procedure.   Do not drive until your wound check or until instructed by your healthcare provider that you are safe to do so.   Monitor your surgical site for redness, swelling, and drainage. Call the device clinic at [phone] if you experience these symptoms or fever/chills.  If your incision is sealed with Steri-strips or staples. You may shower 7 days after your procedure and wash your incision with soap and water as long as it is healed. If your incision is closed with Dermabond/Surgical glue. You may shower 1 day after your pacemaker implant and wash your incision with soap and water. Avoid lotions, ointments, or perfumes over your incision until it is well-healed.  You may use a hot tub or a pool AFTER your wound check appointment if the incision is completely closed.   Your cardiac device may be MRI compatible. We will discuss this at your office visit/Wound check  Remote monitoring is used to monitor your cardiac device from home. This monitoring is scheduled every 91 days by our office. It allows [place] to keep an eye on the functioning of your device to ensure it is working properly. You will routinely see your Electrophysiologist annually (more often if necessary).   Implantable Cardiac Device Lead Replacement, Care After This sheet gives you information about how to care for yourself after your procedure. Your health care provider may also give you more specific instructions. If you have problems or  questions, contact your health care provider. What can I expect after the procedure? After your procedure, it is common to have: Mild discomfort at the incision site. A small amount of drainage or bleeding at the incision site. This is usually no more than a spot. Follow these instructions at home: Incision care        Follow instructions from your health care provider about how to take care of your incision. Make sure you: Leave stitches (sutures), skin glue, or adhesive strips in place. These skin closures may need to stay in place for 2 weeks or longer. If adhesive strip edges start to loosen and curl up, you may trim the loose edges. Do not remove adhesive strips completely unless your health care provider tells you to do that. Check your incision area every day for signs of infection. Check for: More redness, swelling, or pain. More fluid or blood. Warmth. Pus or a bad smell. Electric and Optician, dispensing cell phones should be kept 12 inches (30 cm) away from the cardiac device when they are on. When talking on a cell phone, use the ear on the opposite side of your cardiac device. Do not place a cell phone in a pocket next to the cardiac device. Household appliances do not interfere with modern-day cardiac device. Medicines Take over-the-counter and prescription medicines only as told by your health care provider. General instructions Do not raise the arm on the side of your procedure higher than your shoulder for at least 7 days. Except  for this restriction, continue to use your arm as normal to prevent problems. Do not take baths, swim, or use a hot tub until your health care provider says it is okay to do so. You may shower as directed by your health care provider. Do not lift anything that is heavier than 10 lb (4.5 kg) for 6 weeks or the limit that your health care provider tells you, until he or she says that it is safe. Return to your normal activities after 2 weeks, or  as told by your health care provider. Ask your health care provider what activities are safe for you. Keep all follow-up visits as told by your health care provider. This is important. Contact a health care provider if: You have more redness, swelling, or pain around your incision. You have more fluid or blood coming from your incision. Your incision feels warm to the touch. You have pus or a bad smell coming from your incision. You have a fever. The arm or hand on the side of the cardiac device becomes swollen. The symptoms you had before your procedure are not getting better. Get help right away if: You develop chest pain. You feel like you will faint. You feel light-headed. You faint. Summary Check your incision area every day for signs of infection, such as more fluid or blood. A small amount of drainage or bleeding at the incision site is normal. Do not raise the arm on the side of your procedure higher than your shoulder for at least 5 days, or as long as directed by your health care provider. Digital cell phones should be kept 12 inches (30 cm) away from the cardiac device when they are on. When talking on a cell phone, use the ear on the opposite side of your cardiac device. If the symptoms that led to having your lead replaced are not getting better, contact your health care provider. This information is not intended to replace advice given to you by your health care provider. Make sure you discuss any questions you have with your health care provider.

## 2021-12-18 NOTE — Plan of Care (Signed)

## 2021-12-18 NOTE — Discharge Summary (Addendum)
ELECTROPHYSIOLOGY PROCEDURE DISCHARGE SUMMARY    Patient ID: [name],  MRN: [id number], DOB/AGE: Aug 25, 1965 56 y.o.  Admit date: 12/17/2021 Discharge date: 12/18/2021  Primary Care Physician: Lynnda Child, MD  Primary Cardiologist: Thomasene Ripple, DO  Electrophysiologist: Dr. Ladona Ridgel  Primary Discharge Diagnosis:  RV lead dislodgement s/p lead revision this admission   Secondary Discharge Diagnosis:  Heart Block  Allergies  Allergen Reactions   Penicillin G Other (See Comments)   Penicillins Other (See Comments)    Causes severe, difficult to treat yeast infections     Procedures This Admission:  1.  Revision of a Field seismologist PPM on 12/17/2021 by Dr. Ladona Ridgel. The patient underwent removal of an existing RV lead which was replaced with a new, active fixation [employer] [phone] 59 right ventricular lead.  The atrial lead was stable with repositioning during the case and did not require revision. There were no immediate post procedure complications.   2.  CXR on 12/18/21 demonstrated no pneumothorax status post device implantation.    Brief HPI: [name] is a 56 y.o. female underwent PPM implantation 7/17. She called triage 7/23 with complaints of heart thumping.  She was seen in office 12/16/2021 and noted to have loss of RA and RV capture. Risks, benefits, and alternatives to PPM system revision were reviewed with the patient who wished to proceed.   Hospital Course:  The patient was admitted and underwent revision of a [employer] dual chamber PPM with details as outlined above.  She was monitored on telemetry overnight which demonstrated NSR.  Left chest was without hematoma or ecchymosis.  The device was interrogated and found to be functioning normally.  CXR was obtained and demonstrated no pneumothorax status post device implantation.  Wound care, arm mobility, and restrictions were reviewed with the patient.  The  patient was examined and considered stable for discharge to home.    Anticoagulation resumption This patient is not on anticoagulation     Physical Exam: Vitals:   12/17/21 2129 12/18/21 0041 12/18/21 0415 12/18/21 0526  BP: 129/67 (!) 118/55 115/63   Pulse: 84 70 (!) 59 62  Resp: 17 18    Temp: 97.8 F (36.6 C) 97.9 F (36.6 C) 97.7 F (36.5 C)   TempSrc: Oral Oral Oral   SpO2: 97% 98% 97% 100%  Weight:      Height:        GEN- The patient is well appearing, alert and oriented x 3 today.   HEENT: normocephalic, atraumatic; sclera clear, conjunctiva pink; hearing intact; oropharynx clear; neck supple, no JVP Lymph- no cervical lymphadenopathy Lungs- Clear to ausculation bilaterally, normal work of breathing.  No wheezes, rales, rhonchi Heart- Regular rate and rhythm, no murmurs, rubs or gallops, PMI not laterally displaced GI- soft, non-tender, non-distended, bowel sounds present, no hepatosplenomegaly Extremities- no clubbing, cyanosis, or edema; DP/PT/radial pulses 2+ bilaterally MS- no significant deformity or atrophy Skin- warm and dry, no rash or lesion, left chest without hematoma/ecchymosis Psych- euthymic mood, full affect Neuro- strength and sensation are intact   Labs:   Lab Results  Component Value Date   WBC 8.8 12/16/2021   HGB 12.9 12/16/2021   HCT 39.4 12/16/2021   MCV 88.9 12/16/2021   PLT 437 (H) 12/16/2021    Recent Labs  Lab 12/16/21 0953  NA 137  K 3.9  CL 99  CO2 30  BUN 13  CREATININE 0.68  CALCIUM 9.5  PROT 7.4  BILITOT 0.4  ALKPHOS 128*  ALT 17  AST 21  GLUCOSE 99    Discharge Medications:  Allergies as of 12/18/2021       Reactions   Penicillin G Other (See Comments)   Penicillins Other (See Comments)   Causes severe, difficult to treat yeast infections        Medication List     TAKE these medications    acetaminophen 325 MG tablet Commonly known as: TYLENOL Take 1-2 tablets (325-650 mg total) by mouth every 4  (four) hours as needed for mild pain.   BIOFREEZE EX Apply 1 application  topically daily as needed (On Knees and feet).   Biotin 1000 MCG tablet Take 1,000 mcg by mouth every other day.   celecoxib 200 MG capsule Commonly known as: CELEBREX Take 200 mg by mouth daily as needed for mild pain or moderate pain.   fluticasone 50 MCG/ACT nasal spray Commonly known as: FLONASE Place 2 sprays into both nostrils daily. What changed:  when to take this reasons to take this   GLUCOSAMINE PO Take 1,000 mg by mouth daily.   hydrochlorothiazide 25 MG tablet Commonly known as: HYDRODIURIL TAKE 1 TABLET (25 MG TOTAL) BY MOUTH DAILY.   Ibuprofen 200 MG Caps Take 400 mg by mouth daily as needed (For pain).   Klor-Con M20 20 MEQ tablet Generic drug: potassium chloride SA TAKE 1 TABLET BY MOUTH EVERY DAY What changed:  how much to take when to take this   levothyroxine 88 MCG tablet Commonly known as: SYNTHROID TAKE 1 TABLET BY MOUTH EVERY DAY What changed: when to take this   loratadine 10 MG tablet Commonly known as: CLARITIN TAKE 1 TABLET BY MOUTH EVERY DAY   losartan 100 MG tablet Commonly known as: COZAAR TAKE 1 TABLET BY MOUTH EVERY DAY   Magnesium 250 MG Tabs Take 250 mg by mouth daily. For leg cramps   omeprazole 40 MG capsule Commonly known as: PRILOSEC Take 1 capsule (40 mg total) by mouth daily. What changed:  when to take this reasons to take this   Turmeric 500 MG Caps Take 500 mg by mouth daily.   Vitamin D3 50 MCG (2000 UT) Tabs Take 2,000 Units by mouth daily.   YumVs Beet Root-Tart Cherry 250-0.5 MG Chew Chew 1 tablet by mouth 2 (two) times a week.        Disposition:    Follow-up Information     [facility] Follow up.   Why: on 8/9 at 1040 am for post lead revision check Contact information: [address] [place] [address] [phone]                 Duration of Discharge Encounter: Greater than 30 minutes including physician time.  Signed, Graciella Freer, PA-C  12/18/2021 7:46 AM  I have seen and examined this patient with Otilio Saber.  Agree with above, note added to reflect my findings.  Patient mated to the hospital after being found to have RV lead dislodgment and perforation.  She is now status post lead revision.  Patient is feeling well.  Postprocedure chest x-ray is without abnormality.  We Maverik Foot plan for discharge today with follow-up in clinic.  GEN: Well nourished, well developed, in no acute distress  HEENT: normal  Neck: no JVD, carotid bruits, or masses Cardiac: RRR; no murmurs, rubs, or gallops,no edema  Respiratory:  clear to auscultation bilaterally, normal work of breathing GI:  soft, nontender, nondistended, + BS MS: no deformity or atrophy  Skin: warm and dry, device site well healed Neuro:  Strength and sensation are intact Psych: euthymic mood, full affect    [name] Yerian M. Burak Zerbe MD 12/18/2021 1:26 PM

## 2021-12-19 ENCOUNTER — Ambulatory Visit: Payer: 59

## 2021-12-21 NOTE — Interval H&P Note (Signed)
History and Physical Interval Note:  12/21/2021 10:53 PM  [name]  has presented today for surgery, with the diagnosis of lead malfunction.  The various methods of treatment have been discussed with the patient and family. After consideration of risks, benefits and other options for treatment, the patient has consented to  Procedure(s): LEAD REVISION/REPAIR (N/A) as a surgical intervention.  The patient's history has been reviewed, patient examined, no change in status, stable for surgery.  I have reviewed the patient's chart and labs.  Questions were answered to the patient's satisfaction.     Cristopher Peru

## 2021-12-22 ENCOUNTER — Other Ambulatory Visit: Payer: Self-pay | Admitting: Family Medicine

## 2021-12-22 ENCOUNTER — Telehealth: Payer: Self-pay | Admitting: Physician Assistant

## 2021-12-22 MED ORDER — COLCHICINE 0.6 MG PO TABS: 0.6000 mg | ORAL_TABLET | Freq: Two times a day (BID) | ORAL | 3 refills | Status: DC

## 2021-12-22 NOTE — Telephone Encounter (Signed)
  She has developed a cramping pain in the back of her neck and in the back of her shoulder. The pain has moved down and feels like it is behind the sternum. The pain woke her up last night.   The pain is worse when she breathes in.   The site is fine.   She has had a low-grade temp of 99.9.  Spoke with Dr. Curt Bears.  He recommends adding colchicine 0.6 mg twice daily, and contacting the office to get her a chest x-ray soon as possible.  I sent in the medication and advised the patient that the office would be calling her.  I will route this note to Dr. Lovena Le

## 2021-12-23 ENCOUNTER — Encounter (HOSPITAL_COMMUNITY): Payer: Self-pay | Admitting: Internal Medicine

## 2021-12-23 ENCOUNTER — Telehealth: Payer: Self-pay

## 2021-12-23 ENCOUNTER — Encounter: Payer: Self-pay | Admitting: Internal Medicine

## 2021-12-23 ENCOUNTER — Ambulatory Visit
Admission: RE | Admit: 2021-12-23 | Discharge: 2021-12-23 | Disposition: A | Discharge status: Home / Self Care | Payer: 59 | Source: Ambulatory Visit | Attending: Internal Medicine | Admitting: Internal Medicine

## 2021-12-23 DIAGNOSIS — R079 Chest pain, unspecified: Secondary | ICD-10-CM

## 2021-12-23 DIAGNOSIS — Z950 Presence of cardiac pacemaker: Secondary | ICD-10-CM

## 2021-12-23 DIAGNOSIS — I442 Atrioventricular block, complete: Secondary | ICD-10-CM

## 2021-12-23 DIAGNOSIS — M47814 Spondylosis without myelopathy or radiculopathy, thoracic region: Secondary | ICD-10-CM | POA: Diagnosis not present

## 2021-12-23 NOTE — Telephone Encounter (Signed)
Outreach made to Pt.  Will plan on getting a chest xray today.  She picked up prescription for colchicine last evening and has started it.  She states her temp this morning when she woke up was 100.3 but she believes she has returned to normal  now.  She questions if she needs labs.  Advised I would speak with GT this morning and confirm testing needs before she gets her chest xray.  She is in agreement.

## 2021-12-23 NOTE — Telephone Encounter (Signed)
Xray results received and reviewed by Dr. Lovena Le.  Dr. Lovena Le has requested an echo to evaluate for pericarditis.  Echo ordered and scheduled for Thursday August 3.  Will continue to  monitor.

## 2021-12-26 ENCOUNTER — Ambulatory Visit (HOSPITAL_COMMUNITY): Payer: 59 | Attending: Cardiology

## 2021-12-26 ENCOUNTER — Other Ambulatory Visit: Payer: Self-pay | Admitting: Internal Medicine

## 2021-12-26 DIAGNOSIS — R079 Chest pain, unspecified: Secondary | ICD-10-CM

## 2021-12-26 DIAGNOSIS — I319 Disease of pericardium, unspecified: Secondary | ICD-10-CM

## 2021-12-26 LAB — ECHOCARDIOGRAM LIMITED
Area-P 1/2: 3.17 cm2
S' Lateral: 3.1 cm

## 2022-01-01 ENCOUNTER — Ambulatory Visit (INDEPENDENT_AMBULATORY_CARE_PROVIDER_SITE_OTHER): Payer: 59

## 2022-01-01 DIAGNOSIS — R001 Bradycardia, unspecified: Secondary | ICD-10-CM | POA: Diagnosis not present

## 2022-01-01 LAB — CUP PACEART INCLINIC DEVICE CHECK
Date Time Interrogation Session: 20230809123033
Implantable Lead Implant Date: 20230717
Implantable Lead Implant Date: 20230717
Implantable Lead Location: 753859
Implantable Lead Location: 753860
Implantable Lead Model: 7841
Implantable Lead Model: 7842
Implantable Lead Serial Number: 1204466
Implantable Lead Serial Number: 1298900
Implantable Pulse Generator Implant Date: 20230717
Lead Channel Impedance Value: 552 Ohm
Lead Channel Impedance Value: 679 Ohm
Lead Channel Pacing Threshold Amplitude: 0.9 V
Lead Channel Pacing Threshold Amplitude: 1.3 V
Lead Channel Pacing Threshold Pulse Width: 0.4 ms
Lead Channel Pacing Threshold Pulse Width: 1 ms
Lead Channel Sensing Intrinsic Amplitude: 18.9 mV
Lead Channel Sensing Intrinsic Amplitude: 2.6 mV
Lead Channel Setting Pacing Amplitude: 3 V
Lead Channel Setting Pacing Amplitude: 3.5 V
Lead Channel Setting Pacing Pulse Width: 0.4 ms
Lead Channel Setting Sensing Sensitivity: 1 mV
Pulse Gen Serial Number: 603605

## 2022-01-01 NOTE — Progress Notes (Signed)
Wound check appointment. Steri-strips removed. Wound without redness or edema. Incision edges approximated, wound well healed. Normal device function.   RA thresholds, sensing, and impedances consistent with implant measurements.   RV threshold noted 2.7 V @ 0.4 ms with Pt c/o thumping in left abdomen at this output.  RV threshold 1.8 V  1.0 ms.  Pt does not complain of thumping at this output.  Spoke with GT.  AV delays extended to 300.  RV output changed to 3.0 V @ 1 ms.  Histogram distribution appropriate for patient and level of activity. AT/AF Events: 53 Total Time in AT/AF (min): 8.3-EGM reviewed illustrates far field.  Patient educated about wound care, arm mobility, lifting restrictions. ROV in 3 months with implanting physician.

## 2022-01-01 NOTE — Patient Instructions (Signed)
   After Your Pacemaker   Monitor your pacemaker site for redness, swelling, and drainage. Call the device clinic at [phone] if you experience these symptoms or fever/chills.  Your incision was closed with Steri-strips or staples:  You may shower 7 days after your procedure and wash your incision with soap and water. Avoid lotions, ointments, or perfumes over your incision until it is well-healed..  Do not lift, push or pull greater than 10 pounds with the affected arm until January 28 2022. There are no other restrictions in arm movement after your wound check appointment.   Remote monitoring is used to monitor your pacemaker from home. This monitoring is scheduled every 91 days by our office. It allows [place] to keep an eye on the functioning of your device to ensure it is working properly. You will routinely see your Electrophysiologist annually (more often if necessary).

## 2022-01-10 ENCOUNTER — Telehealth: Payer: Self-pay

## 2022-01-10 NOTE — Telephone Encounter (Signed)
CV Remote Solution Alert...  Device alert Right ventricular automatic threshold detected as > programmed amplitude or suspended. Threshold >3V @ 0.43m, programmed 3V, decrease in V impedance since implant AMS 1-5sec in duration show FF oversensing.  Device clinic apt made 01/15/22 @ 8:40 am. Location, date and time discussed with patient with verbal understanding.

## 2022-01-13 ENCOUNTER — Ambulatory Visit
Admission: EM | Admit: 2022-01-13 | Discharge: 2022-01-13 | Disposition: A | Discharge status: Home / Self Care | Payer: 59

## 2022-01-13 ENCOUNTER — Other Ambulatory Visit: Payer: Self-pay

## 2022-01-13 ENCOUNTER — Emergency Department (HOSPITAL_BASED_OUTPATIENT_CLINIC_OR_DEPARTMENT_OTHER)
Admission: EM | Admit: 2022-01-13 | Discharge: 2022-01-14 | Disposition: A | Discharge status: Home / Self Care | Payer: 59 | Attending: Emergency Medicine | Admitting: Emergency Medicine

## 2022-01-13 ENCOUNTER — Telehealth: Payer: Self-pay | Admitting: Family Medicine

## 2022-01-13 ENCOUNTER — Emergency Department (HOSPITAL_BASED_OUTPATIENT_CLINIC_OR_DEPARTMENT_OTHER): Payer: 59 | Admitting: Radiology

## 2022-01-13 ENCOUNTER — Encounter (HOSPITAL_BASED_OUTPATIENT_CLINIC_OR_DEPARTMENT_OTHER): Payer: Self-pay | Admitting: Obstetrics and Gynecology

## 2022-01-13 DIAGNOSIS — Z79899 Other long term (current) drug therapy: Secondary | ICD-10-CM | POA: Diagnosis not present

## 2022-01-13 DIAGNOSIS — Z8616 Personal history of COVID-19: Secondary | ICD-10-CM | POA: Insufficient documentation

## 2022-01-13 DIAGNOSIS — I110 Hypertensive heart disease with heart failure: Secondary | ICD-10-CM | POA: Diagnosis not present

## 2022-01-13 DIAGNOSIS — I5030 Unspecified diastolic (congestive) heart failure: Secondary | ICD-10-CM | POA: Insufficient documentation

## 2022-01-13 DIAGNOSIS — R0602 Shortness of breath: Secondary | ICD-10-CM | POA: Insufficient documentation

## 2022-01-13 DIAGNOSIS — K219 Gastro-esophageal reflux disease without esophagitis: Secondary | ICD-10-CM | POA: Insufficient documentation

## 2022-01-13 DIAGNOSIS — Z950 Presence of cardiac pacemaker: Secondary | ICD-10-CM | POA: Diagnosis not present

## 2022-01-13 DIAGNOSIS — R079 Chest pain, unspecified: Secondary | ICD-10-CM | POA: Diagnosis not present

## 2022-01-13 DIAGNOSIS — R1013 Epigastric pain: Secondary | ICD-10-CM | POA: Diagnosis not present

## 2022-01-13 LAB — COMPREHENSIVE METABOLIC PANEL
ALT: 17 U/L (ref 0–44)
AST: 19 U/L (ref 15–41)
Albumin: 4.4 g/dL (ref 3.5–5.0)
Alkaline Phosphatase: 111 U/L (ref 38–126)
Anion gap: 10 (ref 5–15)
BUN: 18 mg/dL (ref 6–20)
CO2: 26 mmol/L (ref 22–32)
Calcium: 9.4 mg/dL (ref 8.9–10.3)
Chloride: 102 mmol/L (ref 98–111)
Creatinine, Ser: 0.67 mg/dL (ref 0.44–1.00)
GFR, Estimated: >60 mL/min (ref >60)
Glucose, Bld: 80 mg/dL (ref 70–99)
Potassium: 4.2 mmol/L (ref 3.5–5.1)
Sodium: 138 mmol/L (ref 135–145)
Total Bilirubin: 0.3 mg/dL (ref 0.3–1.2)
Total Protein: 7.5 g/dL (ref 6.5–8.1)

## 2022-01-13 LAB — CBC
HCT: 38.8 % (ref 36.0–46.0)
Hemoglobin: 12.8 g/dL (ref 12.0–15.0)
MCH: 29.2 pg (ref 26.0–34.0)
MCHC: 33 g/dL (ref 30.0–36.0)
MCV: 88.4 fL (ref 80.0–100.0)
Platelets: 389 10*3/uL (ref 150–400)
RBC: 4.39 MIL/uL (ref 3.87–5.11)
RDW: 13.8 % (ref 11.5–15.5)
WBC: 10.5 10*3/uL (ref 4.0–10.5)
nRBC: 0 % (ref 0.0–0.2)

## 2022-01-13 LAB — LIPASE, BLOOD: Lipase: 28 U/L (ref 11–51)

## 2022-01-13 LAB — TROPONIN I (HIGH SENSITIVITY)
Troponin I (High Sensitivity): 4 ng/L (ref <18)
Troponin I (High Sensitivity): 4 ng/L (ref <18)

## 2022-01-13 LAB — OCCULT BLOOD X 1 CARD TO LAB, STOOL: Fecal Occult Bld: NEGATIVE

## 2022-01-13 NOTE — Telephone Encounter (Signed)
That is fine with me. My last day at [facility] is 02/28/2022

## 2022-01-13 NOTE — Telephone Encounter (Signed)
Pt scheduled for TOC on 03/03/22 @ 1 pm

## 2022-01-13 NOTE — ED Triage Notes (Signed)
Patient reports to the ER for abdominal pain. She reports pain in her abdomen and shortness of breath when she tries to lay down. Patient reports she recently had a pacemaker inserted and is concerned about a GI bleed due to her dark stools.

## 2022-01-13 NOTE — Telephone Encounter (Signed)
Pt called to request a TOC from   Dr. Trilby Drummer at [facility]   to   Dr. Judieth Keens at [facility].  Is this alright with both providers?  Please advise so that I can schedule Pt for some time in October 2023.  Thank you.  Respectfully.  (IC)

## 2022-01-14 DIAGNOSIS — R1013 Epigastric pain: Secondary | ICD-10-CM | POA: Diagnosis not present

## 2022-01-14 DIAGNOSIS — K5730 Diverticulosis of large intestine without perforation or abscess without bleeding: Secondary | ICD-10-CM | POA: Diagnosis not present

## 2022-01-14 DIAGNOSIS — K449 Diaphragmatic hernia without obstruction or gangrene: Secondary | ICD-10-CM | POA: Diagnosis not present

## 2022-01-14 DIAGNOSIS — K219 Gastro-esophageal reflux disease without esophagitis: Secondary | ICD-10-CM | POA: Diagnosis not present

## 2022-01-14 NOTE — ED Provider Notes (Signed)
[place] EMERGENCY DEPT Provider Note  CSN: [id number] Arrival date & time: 01/13/22 1643  Chief Complaint(s) Abdominal Pain  HPI [name] is a 56 y.o. female with history of GERD, intermittent complete heart block status post pacemaker presenting to the emergency department with epigastric pain.   She reports this pain is similar to chronic GERD although worsening.  She also is concerned about recent black stools.  Denies any nausea, vomiting, hematemesis.  Denies any fevers or chills.  Denies any weight loss, night sweats.  She reports that she has been compliant with her antiacid medications.  She reports occasional shortness of breath, sometimes with lying flat, but denies leg swelling.  No cough.  She reports abdominal pain is burning.   Past Medical History Past Medical History:  Diagnosis Date   Allergic rhinitis, cause unspecified    Allergy 2016   Anemia, iron deficiency    Arthritis    COVID-19 69/62/9528   Diastolic dysfunction    Diverticulosis of colon    GERD (gastroesophageal reflux disease)    Goiter    Hiatal hernia    Hypertension    IBS (irritable bowel syndrome)    Morbid obesity ([place])    Obstructive sleep apnea    Sleep apnea    Thrombocytosis    Undiagnosed cardiac murmurs    Patient Active Problem List   Diagnosis Date Noted   Pacemaker complications 41/32/4401   Transient complete heart block ([place]) 12/16/2021   Pacemaker 12/16/2021   SOB (shortness of breath) 09/17/2021   Murmur 09/17/2021   Chest pain of uncertain etiology 02/72/5366   Acute pain of right shoulder 01/01/2021   Sinus congestion 12/17/2020   Abnormal stools 01/24/2020   Dizziness 01/24/2020   Leg swelling 01/24/2020   Primary generalized (osteo)arthritis 09/21/2019   Elevated serum alkaline phosphatase level 09/21/2019   Rash 09/21/2019   Left hip pain 09/21/2019   Bradycardia 11/11/2018   Vitamin D deficiency 11/11/2018   Vitamin B12 deficiency  11/11/2018   Hypokalemia 11/11/2018   Thrombocytosis 11/11/2018   Bleeding 12/02/2017   Polymenorrhea 12/02/2017   Polyp of cervix 12/02/2017   Urinary incontinence 12/02/2017   Pain of right upper extremity 09/14/2017   Hypersomnia 06/05/2016   Abdominal pain, chronic, epigastric 04/13/2015   Irregular bleeding 06/22/2014   Obesity (BMI 30-39.9) 07/25/2013   Breast mass 07/25/2013   Changing mole 01/28/2013   Dysphagia, pharyngoesophageal phase 03/10/2011   Bariatric surgery status 03/10/2011   Esophageal ring 03/10/2011   SPINAL STENOSIS, LUMBAR 04/08/2010   HIATAL HERNIA 03/08/2010   Connective tissue disease ([place]) 03/08/2010   DYSPNEA 44/07/4740   DIASTOLIC DYSFUNCTION 59/56/3875   ECHOCARDIOGRAM, ABNORMAL 12/11/2009   PALPITATIONS, OCCASIONAL 12/05/2009   CARDIAC MURMUR 12/05/2009   PAIN IN JOINT, MULTIPLE SITES 07/11/2009   MALAISE AND FATIGUE 07/05/2009   Obstructive sleep apnea 09/25/2008   GERD 01/04/2008   IBS 01/04/2008   Goiter 11/30/2007   MORBID OBESITY 11/22/2007   ANEMIA 08/23/2007   Allergic rhinitis 08/23/2007   DIVERTICULOSIS, COLON 08/23/2007   Other iron deficiency anemias 12/23/2006   Hypothyroidism 12/17/2006   Disease of blood and blood forming organ 12/17/2006   Essential hypertension 12/17/2006   LOW BACK PAIN 12/17/2006   Home Medication(s) Prior to Admission medications   Medication Sig Start Date End Date Taking? Authorizing Provider  acetaminophen (TYLENOL) 325 MG tablet Take 1-2 tablets (325-650 mg total) by mouth every 4 (four) hours as needed for mild pain. 12/18/21   Shirley Friar,  PA-C  Biotin 1000 MCG tablet Take 1,000 mcg by mouth every other day.    [provider]  celecoxib (CELEBREX) 200 MG capsule Take 200 mg by mouth daily as needed for mild pain or moderate pain. 09/21/19   [provider]  Cholecalciferol (VITAMIN D3) 50 MCG (2000 UT) TABS Take 2,000 Units by mouth daily.    [provider]   colchicine 0.6 MG tablet Take 1 tablet (0.6 mg total) by mouth 2 (two) times daily. 12/22/21   Barrett, Evelene Croon, PA-C  fluticasone (FLONASE) 50 MCG/ACT nasal spray Place 2 sprays into both nostrils daily. Patient taking differently: Place 2 sprays into both nostrils daily as needed for allergies. 08/07/21   Lesleigh Noe, MD  Glucosamine HCl (GLUCOSAMINE PO) Take 1,000 mg by mouth daily.    [provider]  hydrochlorothiazide (HYDRODIURIL) 25 MG tablet TAKE 1 TABLET (25 MG TOTAL) BY MOUTH DAILY. 08/03/21   Lesleigh Noe, MD  Ibuprofen 200 MG CAPS Take 400 mg by mouth daily as needed (For pain).    [provider]  KLOR-CON M20 20 MEQ tablet TAKE 1 TABLET BY MOUTH EVERY DAY 12/23/21   Lesleigh Noe, MD  levothyroxine (SYNTHROID) 88 MCG tablet TAKE 1 TABLET BY MOUTH EVERY DAY Patient taking differently: Take 88 mcg by mouth daily before breakfast. 02/25/21   Lesleigh Noe, MD  loratadine (CLARITIN) 10 MG tablet TAKE 1 TABLET BY MOUTH EVERY DAY Patient taking differently: Take 10 mg by mouth daily. 03/23/18   Roma Schanz R, DO  losartan (COZAAR) 100 MG tablet TAKE 1 TABLET BY MOUTH EVERY DAY Patient taking differently: Take 100 mg by mouth daily. 02/20/21   Lesleigh Noe, MD  Magnesium 250 MG TABS Take 250 mg by mouth daily. For leg cramps    [provider]  Menthol, Topical Analgesic, (BIOFREEZE EX) Apply 1 application  topically daily as needed (On Knees and feet).    [provider]  Misc Natural Products (YUMVS BEET ROOT-TART CHERRY) 250-0.5 MG CHEW Chew 1 tablet by mouth 2 (two) times a week.    [provider]  omeprazole (PRILOSEC) 40 MG capsule Take 1 capsule (40 mg total) by mouth daily. Patient taking differently: Take 40 mg by mouth daily as needed (reflux). 04/23/16   Ann Held, DO  Turmeric 500 MG CAPS Take 500 mg by mouth daily.    [provider]                                                                                                                                     Past Surgical History Past Surgical History:  Procedure Laterality Date   ABDOMINAL HYSTERECTOMY  05/2013   BARIATRIC SURGERY  06/17/2010   BILATERAL SALPINGECTOMY Bilateral 06/22/2014   Procedure: BILATERAL SALPINGECTOMY;  Surgeon: Crawford Givens, MD;  Location: [facility];  Service: Gynecology;  Laterality: Bilateral;  CYSTOSCOPY N/A 06/22/2014   Procedure: CYSTOSCOPY;  Surgeon: Crawford Givens, MD;  Location: [facility];  Service: Gynecology;  Laterality: N/A;   ENDOMETRIAL ABLATION  12/08   FOOT SURGERY Left 11/24/2017   Dr Paulla Dolly   LAPAROSCOPIC ASSISTED VAGINAL HYSTERECTOMY N/A 06/22/2014   Procedure: LAPAROSCOPIC ASSISTED VAGINAL HYSTERECTOMY;  Surgeon: Crawford Givens, MD;  Location: [facility];  Service: Gynecology;  Laterality: N/A;   LEAD REVISION/REPAIR N/A 12/17/2021   Procedure: LEAD REVISION/REPAIR;  Surgeon: Haggard Lance, MD;  Location: [facility];  Service: Cardiovascular;  Laterality: N/A;   LIPOMA EXCISION     upper back   PACEMAKER IMPLANT N/A 12/09/2021   Procedure: PACEMAKER IMPLANT;  Surgeon: Crass Lance, MD;  Location: [facility];  Service: Cardiovascular;  Laterality: N/A;   SPINE SURGERY  02/10/2012   L4-l5 fusion--cohen   Family History Family History  Problem Relation Age of Onset   Breast cancer Mother 40   Arrhythmia Mother    Heart disease Mother    Arthritis Mother    Diabetes Mother    Hyperlipidemia Mother    Hypertension Mother    Hypertension Father    Kidney cancer Father    Colon polyps Father    Lymphoma Father        Diffused Large B-cell Lymphoma   Breast cancer Maternal Aunt 74   Stroke Maternal Grandmother 85   Ovarian cancer Paternal Grandmother    Colon cancer Neg Hx     Social History Social History   Tobacco Use   Smoking status: Never    Passive exposure: Never   Smokeless tobacco: Never   Tobacco comments:    Never Used Tobacco  Vaping Use    Vaping Use: Never used  Substance Use Topics   Alcohol use: No    Alcohol/week: 0.0 standard drinks of alcohol   Drug use: No   Allergies Penicillin g and Penicillins  Review of Systems Review of Systems  All other systems reviewed and are negative.   Physical Exam Vital Signs  I have reviewed the triage vital signs BP 119/66 (BP Location: Right Arm)   Pulse 60   Temp 98.1 F (36.7 C) (Oral)   Resp 18   Ht 5' 3.75" (1.619 m)   Wt 108 kg   LMP 01/20/2011   SpO2 99%   BMI 41.17 kg/m  Physical Exam Vitals and nursing note reviewed.  Constitutional:      General: She is not in acute distress.    Appearance: She is well-developed.  HENT:     Head: Normocephalic and atraumatic.     Mouth/Throat:     Mouth: Mucous membranes are moist.  Eyes:     Pupils: Pupils are equal, round, and reactive to light.  Cardiovascular:     Rate and Rhythm: Normal rate and regular rhythm.     Heart sounds: No murmur heard. Pulmonary:     Effort: Pulmonary effort is normal. No respiratory distress.     Breath sounds: Normal breath sounds.  Abdominal:     General: Abdomen is flat.     Palpations: Abdomen is soft.     Tenderness: There is no abdominal tenderness.  Genitourinary:    Comments: Chaperoned by RN, small amount of dark stool, no gross blood Musculoskeletal:        General: No tenderness.     Right lower leg: No edema.     Left lower leg: No edema.  Skin:    General:  Skin is warm and dry.  Neurological:     General: No focal deficit present.     Mental Status: She is alert. Mental status is at baseline.  Psychiatric:        Mood and Affect: Mood normal.        Behavior: Behavior normal.     ED Results and Treatments Labs (all labs ordered are listed, but only abnormal results are displayed) Labs Reviewed  LIPASE, BLOOD  COMPREHENSIVE METABOLIC PANEL  CBC  OCCULT BLOOD X 1 CARD TO LAB, STOOL  POC OCCULT BLOOD, ED  TROPONIN I (HIGH SENSITIVITY)  TROPONIN I (HIGH  SENSITIVITY)                                                                                                                          Radiology DG Chest 2 View  Result Date: 01/13/2022 CLINICAL DATA:  Chest pain EXAM: CHEST - 2 VIEW COMPARISON:  12/23/2021 FINDINGS: Lungs are well expanded, symmetric, and clear. No pneumothorax or pleural effusion. Cardiac size within normal limits. Left subclavian dual lead pacemaker is unchanged. Pulmonary vascularity is normal. Osseous structures are age-appropriate. No acute bone abnormality. IMPRESSION: No active cardiopulmonary disease. Electronically Signed   By: Fidela Salisbury M.D.   On: 01/13/2022 22:01    Pertinent labs & imaging results that were available during my care of the patient were reviewed by me and considered in my medical decision making (see MDM for details).  Medications Ordered in ED Medications - No data to display                                                                                                                                   Procedures Procedures  (including critical care time)  Medical Decision Making / ED Course   MDM:  56 year old female presenting to the emergency department with abdominal pain.  On exam, patient has benign abdominal exam.  Symptoms similar to prior episodes of GERD.  Labs reassuring including lipase, CMP.  Performed rectal exam, sent to lab, negative for occult blood.  Doubt GI bleeding.  Extremely low concern for ACS, troponin obtained by triage nurses and negative.  Doubt pancreatitis, cholecystitis, biliary colic.  The patient reports shortness of breath, chest x-ray clear, no pulmonary findings on exam, no swelling to suggest CHF.  Will provide referral to gastroenterology. Will discharge patient to home. All questions answered.  Patient comfortable with plan of discharge. Return precautions discussed with patient and specified on the after visit summary.   Clinical Course as of  01/14/22 1617  Mon Jan 13, 2022  2213 DG Chest 2 View [WS]    Clinical Course User Index [WS] Cristie Hem, MD     Additional history obtained: -Additional history obtained from daughter -External records from outside source obtained and reviewed including: Chart review including previous notes, labs, imaging, consultation notes   Lab Tests: -I ordered, reviewed, and interpreted labs.   The pertinent results include:   Labs Reviewed  LIPASE, BLOOD  COMPREHENSIVE METABOLIC PANEL  CBC  OCCULT BLOOD X 1 CARD TO LAB, STOOL  POC OCCULT BLOOD, ED  TROPONIN I (HIGH SENSITIVITY)  TROPONIN I (HIGH SENSITIVITY)      Imaging Studies ordered: I ordered imaging studies including CXR I independently visualized and interpreted imaging. I agree with the radiologist interpretation   Medicines ordered and prescription drug management: No orders of the defined types were placed in this encounter.   -I have reviewed the patients home medicines and have made adjustments as needed    Cardiac Monitoring: The patient was maintained on a cardiac monitor.  I personally viewed and interpreted the cardiac monitored which showed an underlying rhythm of: NSR  Social Determinants of Health:  Factors impacting patients care include: obesity   Reevaluation: After the interventions noted above, I reevaluated the patient and found that they have :stayed the same  Co morbidities that complicate the patient evaluation  Past Medical History:  Diagnosis Date   Allergic rhinitis, cause unspecified    Allergy 2016   Anemia, iron deficiency    Arthritis    COVID-19 05/14/7587   Diastolic dysfunction    Diverticulosis of colon    GERD (gastroesophageal reflux disease)    Goiter    Hiatal hernia    Hypertension    IBS (irritable bowel syndrome)    Morbid obesity ([place])    Obstructive sleep apnea    Sleep apnea    Thrombocytosis    Undiagnosed cardiac murmurs        Dispostion: Discharge     Final Clinical Impression(s) / ED Diagnoses Final diagnoses:  Epigastric pain     This chart was dictated using voice recognition software.  Despite best efforts to proofread,  errors can occur which can change the documentation meaning.    Cristie Hem, MD 01/14/22 [phone]

## 2022-01-14 NOTE — Discharge Instructions (Signed)
We evaluated you today in the emergency department for your epigastric pain.  We did not find signs of a dangerous condition at this time.  Your lab tests were reassuring.  Your stool tested negative for blood.  Given that your symptoms are worsening, we would recommend that you call follow-up with a gastroenterologist.  Please call gastroenterology for follow-up as soon as possible.  Please also follow-up with your primary physician.  Please return to the emergency department if you develop any worsening pain, severe pain, uncontrolled vomiting, difficulty breathing, vomiting blood, bright red blood in your stool, or any other concerning symptoms.

## 2022-01-15 ENCOUNTER — Ambulatory Visit (INDEPENDENT_AMBULATORY_CARE_PROVIDER_SITE_OTHER): Payer: 59

## 2022-01-15 DIAGNOSIS — I442 Atrioventricular block, complete: Secondary | ICD-10-CM

## 2022-01-15 LAB — CUP PACEART INCLINIC DEVICE CHECK
Date Time Interrogation Session: 20230823134609
Implantable Lead Implant Date: 20230717
Implantable Lead Implant Date: 20230717
Implantable Lead Location: 753859
Implantable Lead Location: 753860
Implantable Lead Model: 7841
Implantable Lead Model: 7842
Implantable Lead Serial Number: 1204466
Implantable Lead Serial Number: 1298900
Implantable Pulse Generator Implant Date: 20230717
Pulse Gen Serial Number: 603605

## 2022-01-15 NOTE — Progress Notes (Signed)
Patient seen in device clinic for RV threshold increase and RV impedance decrease. RV threshold today was 4.0 V @ 0.23m ~ bipolar. Checked unipolar, was 2.3V . [facility], RV output programmed at 5.0V @ 1.04m RV paces <1%.  Reviewed with Dr. TaLovena Lend agreeable with no changes.

## 2022-01-16 ENCOUNTER — Telehealth: Payer: Self-pay

## 2022-01-16 NOTE — Telephone Encounter (Signed)
The patient states she is feeling some tingling sensation with her device. I told her the nurse will review the transmission and give her a call back.

## 2022-01-16 NOTE — Telephone Encounter (Signed)
Spoke with patient, informed her that I had reviewed the transmission from this morning, and her device was functioning normally and the tingling at the top of her device was likely nerve sensations especially since she has had two surgeries in a short amount of time, patient voiced understanding and appreciative of call back .

## 2022-01-30 ENCOUNTER — Encounter: Payer: Self-pay | Admitting: Internal Medicine

## 2022-01-31 ENCOUNTER — Ambulatory Visit: Payer: 59 | Attending: Cardiovascular Disease

## 2022-01-31 DIAGNOSIS — I442 Atrioventricular block, complete: Secondary | ICD-10-CM | POA: Diagnosis not present

## 2022-01-31 LAB — CUP PACEART INCLINIC DEVICE CHECK
Date Time Interrogation Session: 20230908165654
Implantable Lead Implant Date: 20230717
Implantable Lead Implant Date: 20230717
Implantable Lead Location: 753859
Implantable Lead Location: 753860
Implantable Lead Model: 7841
Implantable Lead Model: 7842
Implantable Lead Serial Number: 1204466
Implantable Lead Serial Number: 1298900
Implantable Pulse Generator Implant Date: 20230717
Pulse Gen Serial Number: 603605

## 2022-01-31 NOTE — Progress Notes (Signed)
Patient seen in device clinic for "thumping" sensation in left lower chest area. Patient denies chest pain or pain on inspiration.   Consulted with Joey from [facility], recommended changes made. Patient also has noted AF w/ RVR, no OAC noted.   Attempted to contact Dr. Lovena Le, no answer. Patient given ED precautions over the weekend if she were to develop chest pain, pain on inspiration. Voiced understanding. Direct dial given for on call doc this weekend to clinic and device clinic number.

## 2022-02-06 ENCOUNTER — Telehealth: Payer: Self-pay

## 2022-02-06 NOTE — Telephone Encounter (Signed)
Pt called in to c/o continued "thumping" in her chest.  She notices it throughout the day, there is no specific time.   Pt recently had work in visit to try to reprogram her device without success.  Discussed with Dr. Lovena Le.  He advises an office visit to evaluate.  Returned call to Pt to advise of above and scheduled to see Dr. Lovena Le next available 02/12/22 at 8:30 am.  Pt will call back if that time does not work.

## 2022-02-12 ENCOUNTER — Ambulatory Visit: Payer: 59 | Attending: Internal Medicine | Admitting: Internal Medicine

## 2022-02-12 ENCOUNTER — Encounter: Payer: Self-pay | Admitting: Internal Medicine

## 2022-02-12 VITALS — BP 132/74 | HR 58 | Ht 63.75 in | Wt 239.4 lb

## 2022-02-12 DIAGNOSIS — I10 Essential (primary) hypertension: Secondary | ICD-10-CM | POA: Diagnosis not present

## 2022-02-12 DIAGNOSIS — Z950 Presence of cardiac pacemaker: Secondary | ICD-10-CM | POA: Diagnosis not present

## 2022-02-12 DIAGNOSIS — I4891 Unspecified atrial fibrillation: Secondary | ICD-10-CM | POA: Diagnosis not present

## 2022-02-12 NOTE — Patient Instructions (Addendum)
Medication Instructions:  Your physician recommends that you continue on your current medications as directed. Please refer to the Current Medication list given to you today.  *If you need a refill on your cardiac medications before your next appointment, please call your pharmacy*  Lab Work: None ordered.  If you have labs (blood work) drawn today and your tests are completely normal, you will receive your results only by: [place] (if you have MyChart) OR A paper copy in the mail If you have any lab test that is abnormal or we need to change your treatment, we will call you to review the results.  Testing/Procedures: None ordered.  Follow-Up: At [facility], you and your health needs are our priority.  As part of our continuing mission to provide you with exceptional heart care, we have created designated Provider Care Teams.  These Care Teams include your primary Cardiologist (physician) and Advanced Practice Providers (APPs -  Physician Assistants and Nurse Practitioners) who all work together to provide you with the care you need, when you need it.  We recommend signing up for the patient portal called "MyChart".  Sign up information is provided on this After Visit Summary.  MyChart is used to connect with patients for Virtual Visits (Telemedicine).  Patients are able to view lab/test results, encounter notes, upcoming appointments, etc.  Non-urgent messages can be sent to your provider as well.   To learn more about what you can do with MyChart, go to [email or web address].    Your next appointment:   Please schedule 3 Month follow up appointment with Dr. Cristopher Peru.    The format for your next appointment:   In Person  Provider:   Cristopher Peru, MD{or one of the following Advanced Practice Providers on your designated Care Team:   Tommye Standard, [place] Legrand Como "Jonni Sanger" Chalmers Cater, [place]  Remote monitoring is used to monitor your Pacemaker from home. This monitoring reduces  the number of office visits required to check your device to one time per year. It allows [place] to keep an eye on the functioning of your device to ensure it is working properly. You are scheduled for a device check from home on 03/12/2022. You may send your transmission at any time that day. If you have a wireless device, the transmission will be sent automatically. After your physician reviews your transmission, you will receive a postcard with your next transmission date.  Important Information About [place]

## 2022-02-12 NOTE — Progress Notes (Signed)
HPI Mrs. Fabre returns today for followup. She is a pleasant 56 yo woman with intermittent AV block who underwent PPM insertion complicated by lead migration and asymptomatic perforation. She underwent insertion of a new lead. She had problems with significant cardiac awareness and workup was unremarkable. She was noted to have an initial elevation in her pacing threshold. Overall she feels better. She still has occaisional sensation of a sharp sensation in the chest that we were able to reproduce with VVI pacing, but not DDD pacing.  Allergies  Allergen Reactions   Penicillin G Other (See Comments)   Penicillins Other (See Comments)    Causes severe, difficult to treat yeast infections     Current Outpatient Medications  Medication Sig Dispense Refill   acetaminophen (TYLENOL) 325 MG tablet Take 1-2 tablets (325-650 mg total) by mouth every 4 (four) hours as needed for mild pain.     Biotin 1000 MCG tablet Take 1,000 mcg by mouth every other day.     celecoxib (CELEBREX) 200 MG capsule Take 200 mg by mouth daily as needed for mild pain or moderate pain.     Cholecalciferol (VITAMIN D3) 50 MCG (2000 UT) TABS Take 2,000 Units by mouth daily.     colchicine 0.6 MG tablet Take 1 tablet (0.6 mg total) by mouth 2 (two) times daily. 60 tablet 3   fluticasone (FLONASE) 50 MCG/ACT nasal spray Place 2 sprays into both nostrils daily. (Patient taking differently: Place 2 sprays into both nostrils daily as needed for allergies.) 16 g 6   Glucosamine HCl (GLUCOSAMINE PO) Take 1,000 mg by mouth daily.     hydrochlorothiazide (HYDRODIURIL) 25 MG tablet TAKE 1 TABLET (25 MG TOTAL) BY MOUTH DAILY. 90 tablet 3   Ibuprofen 200 MG CAPS Take 400 mg by mouth daily as needed (For pain).     KLOR-CON M20 20 MEQ tablet TAKE 1 TABLET BY MOUTH EVERY DAY 90 tablet 3   levothyroxine (SYNTHROID) 88 MCG tablet TAKE 1 TABLET BY MOUTH EVERY DAY (Patient taking differently: Take 88 mcg by mouth daily before  breakfast.) 90 tablet 3   loratadine (CLARITIN) 10 MG tablet TAKE 1 TABLET BY MOUTH EVERY DAY (Patient taking differently: Take 10 mg by mouth daily.) 90 tablet 2   losartan (COZAAR) 100 MG tablet TAKE 1 TABLET BY MOUTH EVERY DAY (Patient taking differently: Take 100 mg by mouth daily.) 90 tablet 3   Magnesium 250 MG TABS Take 250 mg by mouth daily. For leg cramps     Menthol, Topical Analgesic, (BIOFREEZE EX) Apply 1 application  topically daily as needed (On Knees and feet).     Misc Natural Products (YUMVS BEET ROOT-TART CHERRY) 250-0.5 MG CHEW Chew 1 tablet by mouth 2 (two) times a week.     omeprazole (PRILOSEC) 40 MG capsule Take 1 capsule (40 mg total) by mouth daily. (Patient taking differently: Take 40 mg by mouth daily as needed (reflux).) 90 capsule 1   Turmeric 500 MG CAPS Take 500 mg by mouth daily.     No current facility-administered medications for this visit.     Past Medical History:  Diagnosis Date   Allergic rhinitis, cause unspecified    Allergy 2016   Anemia, iron deficiency    Arthritis    COVID-19 30/86/5784   Diastolic dysfunction    Diverticulosis of colon    GERD (gastroesophageal reflux disease)    Goiter    Hiatal hernia    Hypertension  IBS (irritable bowel syndrome)    Morbid obesity (HCC)    Obstructive sleep apnea    Sleep apnea    Thrombocytosis    Undiagnosed cardiac murmurs     ROS:   All systems reviewed and negative except as noted in the HPI.   Past Surgical History:  Procedure Laterality Date   ABDOMINAL HYSTERECTOMY  05/2013   BARIATRIC SURGERY  06/17/2010   BILATERAL SALPINGECTOMY Bilateral 06/22/2014   Procedure: BILATERAL SALPINGECTOMY;  Surgeon: Crawford Givens, MD;  Location: [facility];  Service: Gynecology;  Laterality: Bilateral;   CYSTOSCOPY N/A 06/22/2014   Procedure: CYSTOSCOPY;  Surgeon: Crawford Givens, MD;  Location: [facility];  Service: Gynecology;  Laterality: N/A;   ENDOMETRIAL ABLATION  12/08   FOOT SURGERY Left 11/24/2017    Dr Paulla Dolly   LAPAROSCOPIC ASSISTED VAGINAL HYSTERECTOMY N/A 06/22/2014   Procedure: LAPAROSCOPIC ASSISTED VAGINAL HYSTERECTOMY;  Surgeon: Crawford Givens, MD;  Location: [facility];  Service: Gynecology;  Laterality: N/A;   LEAD REVISION/REPAIR N/A 12/17/2021   Procedure: LEAD REVISION/REPAIR;  Surgeon: Dower Lance, MD;  Location: [facility];  Service: Cardiovascular;  Laterality: N/A;   LIPOMA EXCISION     upper back   PACEMAKER IMPLANT N/A 12/09/2021   Procedure: PACEMAKER IMPLANT;  Surgeon: Arnett Lance, MD;  Location: [facility];  Service: Cardiovascular;  Laterality: N/A;   SPINE SURGERY  02/10/2012   L4-l5 fusion--cohen     Family History  Problem Relation Age of Onset   Breast cancer Mother 110   Arrhythmia Mother    Heart disease Mother    Arthritis Mother    Diabetes Mother    Hyperlipidemia Mother    Hypertension Mother    Hypertension Father    Kidney cancer Father    Colon polyps Father    Lymphoma Father        Diffused Large B-cell Lymphoma   Breast cancer Maternal Aunt 57   Stroke Maternal Grandmother 85   Ovarian cancer Paternal Grandmother    Colon cancer Neg Hx      Social History   Socioeconomic History   Marital status: Married    Spouse name: Belenda Cruise   Number of children: 3   Years of education: Some college   Highest education level: Not on file  Occupational History   Occupation: Sales promotion account executive: UNEMPLOYED  Tobacco Use   Smoking status: Never    Passive exposure: Never   Smokeless tobacco: Never   Tobacco comments:    Never Used Tobacco  Vaping Use   Vaping Use: Never used  Substance and Sexual Activity   Alcohol use: No    Alcohol/week: 0.0 standard drinks of alcohol   Drug use: No   Sexual activity: Yes    Partners: Male    Comment: Hysterectomy  Other Topics Concern   Not on file  Social History Narrative   09/21/19   From: [place], [place] originally   Living: with husband, Belenda Cruise and 2 daughters w/  family   Work: homemaker      Family: 3 children - [place], [place], and Museum/gallery conservator. Has 8 grandchildren and one on the way      Enjoys: spend time with family, shopping      Exercise: trying to walk more   Diet: eats late at night, will go most of the day w/o eating      Safety   Seat belts: Yes    Guns: Yes  and secure  Safe in relationships: Yes    Social Determinants of Radio broadcast assistant Strain: Not on file  Food Insecurity: Not on file  Transportation Needs: Not on file  Physical Activity: Not on file  Stress: Not on file  Social Connections: Not on file  Intimate Partner Violence: Not on file     BP 132/74   Pulse (!) 58   Ht 5' 3.75" (1.619 m)   Wt 239 lb 6.4 oz (108.6 kg)   LMP 01/20/2011   SpO2 98%   BMI 41.42 kg/m   Physical Exam:  Well appearing NAD HEENT: Unremarkable Neck:  No JVD, no thyromegally Lymphatics:  No adenopathy Back:  No CVA tenderness Lungs:  Clear with no wheezes HEART:  Regular rate rhythm, no murmurs, no rubs, no clicks Abd:  soft, positive bowel sounds, no organomegally, no rebound, no guarding Ext:  2 plus pulses, no edema, no cyanosis, no clubbing Skin:  No rashes no nodules Neuro:  CN II through XII intact, motor grossly intact   DEVICE  Normal device function.  See PaceArt for details.   Assess/Plan:  Intermittent heart block - she is asymptomatic s/p PPM. She is pacing less than 1% of the time. PAF - she has had episodes lasting up to 90 seconds. I have recommended watchful waiting.I suspect that this is related to her initial lead migration. No indication for an [place] at this time.  PVC's - I strongly suspect that this is what she is feeling as we were able to reproduce her symptoms, not with DDD pacing but with vvi pacing. I discussed this with the patient and recommended watchful waiting. No indication for an AA drug at this time. Obesity - she needs to lose weight.   Carleene Overlie Pennelope Basque,MD

## 2022-02-20 ENCOUNTER — Other Ambulatory Visit: Payer: Self-pay | Admitting: Family Medicine

## 2022-02-20 DIAGNOSIS — I10 Essential (primary) hypertension: Secondary | ICD-10-CM

## 2022-02-20 DIAGNOSIS — E039 Hypothyroidism, unspecified: Secondary | ICD-10-CM

## 2022-02-26 ENCOUNTER — Ambulatory Visit: Payer: 59 | Admitting: Cardiology

## 2022-02-28 ENCOUNTER — Ambulatory Visit: Payer: 59 | Attending: Cardiology | Admitting: Cardiology

## 2022-02-28 ENCOUNTER — Encounter: Payer: Self-pay | Admitting: Cardiology

## 2022-02-28 VITALS — BP 148/88 | HR 60 | Ht 63.0 in | Wt 240.2 lb

## 2022-02-28 DIAGNOSIS — G4733 Obstructive sleep apnea (adult) (pediatric): Secondary | ICD-10-CM

## 2022-02-28 DIAGNOSIS — I10 Essential (primary) hypertension: Secondary | ICD-10-CM

## 2022-02-28 DIAGNOSIS — I480 Paroxysmal atrial fibrillation: Secondary | ICD-10-CM

## 2022-02-28 DIAGNOSIS — I442 Atrioventricular block, complete: Secondary | ICD-10-CM

## 2022-02-28 DIAGNOSIS — M359 Systemic involvement of connective tissue, unspecified: Secondary | ICD-10-CM | POA: Diagnosis not present

## 2022-02-28 MED ORDER — METOPROLOL SUCCINATE ER 25 MG PO TB24: 12.5000 mg | ORAL_TABLET | Freq: Every day | ORAL | 3 refills | Status: DC

## 2022-02-28 NOTE — Progress Notes (Signed)
Cardiology Office Note:    Date:  03/06/2022   ID:  [name], DOB [birth year], MRN [id number]  PCP:  [name], [name] Halsted, MD  Cardiologist:  [name] Salines, DO  Electrophysiologist:  None   Referring MD: [name] Schooner, MD   " I am having palpitations"   History of Present Illness:    [name] is a 56 y.o. female with a hx of  mixed connective tissue disorder, intermittent AV block who underwent PPM insertion complicated by lead migration and asymptomatic perforation-she underwent insertion of a new lead, PAF which was recently found on her ppm interrogation, COPD, mild thrombocytosis, iron deficiency anemia, secondary to absorption after bariatric surgery in 2012, concern for mixed connective tissue disorder, sleep apnea no longer on CPAP, hypertension, irritable bowel syndrome and obesity.  Her last visit with me was 09/16/2021 - at that visit I placed a monitor on the patient which showed intermittent heart block- she is no status post pacemaker implantation.   She is felling better but recently now with increasing palpitations and atrial fibrillation.   No chest pain.   Past Medical History:  Diagnosis Date   Allergic rhinitis, cause unspecified    Allergy 2016   Anemia, iron deficiency    Arthritis    COVID-19 42/68/3419   Diastolic dysfunction    Diverticulosis of colon    GERD (gastroesophageal reflux disease)    Goiter    Hiatal hernia    Hypertension    IBS (irritable bowel syndrome)    Morbid obesity ([name])    Obstructive sleep apnea    Sleep apnea    Thrombocytosis    Undiagnosed cardiac murmurs     Past Surgical History:  Procedure Laterality Date   ABDOMINAL HYSTERECTOMY  05/2013   BARIATRIC SURGERY  06/17/2010   BILATERAL SALPINGECTOMY Bilateral 06/22/2014   Procedure: BILATERAL SALPINGECTOMY;  Surgeon: [name] Givens, MD;  Location: [facility];  Service: Gynecology;  Laterality: Bilateral;   CYSTOSCOPY N/A 06/22/2014   Procedure:  CYSTOSCOPY;  Surgeon: [name] Givens, MD;  Location: [facility];  Service: Gynecology;  Laterality: N/A;   ENDOMETRIAL ABLATION  12/08   FOOT SURGERY Left 11/24/2017   Dr [name]   LAPAROSCOPIC ASSISTED VAGINAL HYSTERECTOMY N/A 06/22/2014   Procedure: LAPAROSCOPIC ASSISTED VAGINAL HYSTERECTOMY;  Surgeon: [name] Givens, MD;  Location: [facility];  Service: Gynecology;  Laterality: N/A;   LEAD REVISION/REPAIR N/A 12/17/2021   Procedure: LEAD REVISION/REPAIR;  Surgeon: [name] Lance, MD;  Location: [facility];  Service: Cardiovascular;  Laterality: N/A;   LIPOMA EXCISION     upper back   PACEMAKER IMPLANT N/A 12/09/2021   Procedure: PACEMAKER IMPLANT;  Surgeon: [name] Lance, MD;  Location: [facility];  Service: Cardiovascular;  Laterality: N/A;   SPINE SURGERY  02/10/2012   L4-l5 fusion--cohen    Current Medications: Current Meds  Medication Sig   acetaminophen (TYLENOL) 325 MG tablet Take 1-2 tablets (325-650 mg total) by mouth every 4 (four) hours as needed for mild pain.   Biotin 1000 MCG tablet Take 1,000 mcg by mouth every other day.   celecoxib (CELEBREX) 200 MG capsule Take 200 mg by mouth daily as needed for mild pain or moderate pain. (Patient not taking: Reported on 03/03/2022)   Cholecalciferol (VITAMIN D3) 50 MCG (2000 UT) TABS Take 2,000 Units by mouth daily.   colchicine 0.6 MG tablet Take 1 tablet (0.6 mg total) by mouth 2 (two) times daily. (Patient taking differently: Take 0.6 mg  by mouth as needed.)   famotidine (PEPCID) 40 MG tablet Take 40 mg by mouth 2 (two) times daily.   fluticasone (FLONASE) 50 MCG/ACT nasal spray Place 2 sprays into both nostrils daily. (Patient taking differently: Place 2 sprays into both nostrils daily as needed for allergies.)   Glucosamine HCl (GLUCOSAMINE PO) Take 1,000 mg by mouth daily.   hydrochlorothiazide (HYDRODIURIL) 25 MG tablet TAKE 1 TABLET (25 MG TOTAL) BY MOUTH DAILY.   Ibuprofen 200 MG CAPS Take 400 mg by mouth daily as needed  (For pain).   KLOR-CON M20 20 MEQ tablet TAKE 1 TABLET BY MOUTH EVERY DAY   loratadine (CLARITIN) 10 MG tablet TAKE 1 TABLET BY MOUTH EVERY DAY (Patient taking differently: Take 10 mg by mouth daily.)   Magnesium 250 MG TABS Take 250 mg by mouth daily. For leg cramps   Menthol, Topical Analgesic, (BIOFREEZE EX) Apply 1 application  topically daily as needed (On Knees and feet).   metoprolol succinate (TOPROL XL) 25 MG 24 hr tablet Take 0.5 tablets (12.5 mg total) by mouth daily.   Misc Natural Products (YUMVS BEET ROOT-TART CHERRY) 250-0.5 MG CHEW Chew 1 tablet by mouth 2 (two) times a week.   omeprazole (PRILOSEC) 40 MG capsule Take 1 capsule (40 mg total) by mouth daily. (Patient taking differently: Take 40 mg by mouth daily as needed (reflux).)   Turmeric 500 MG CAPS Take 500 mg by mouth daily.   [DISCONTINUED] levothyroxine (SYNTHROID) 88 MCG tablet TAKE 1 TABLET BY MOUTH EVERY DAY (Patient taking differently: Take 88 mcg by mouth daily before breakfast.)   [DISCONTINUED] losartan (COZAAR) 100 MG tablet TAKE 1 TABLET BY MOUTH EVERY DAY (Patient taking differently: Take 100 mg by mouth daily.)     Allergies:   Penicillin g and Penicillins   Social History   Socioeconomic History   Marital status: Married    Spouse name: [name]   Number of children: 3   Years of education: Some college   Highest education level: Not on file  Occupational History   Occupation: Sales promotion account executive: UNEMPLOYED  Tobacco Use   Smoking status: Never    Passive exposure: Never   Smokeless tobacco: Never   Tobacco comments:    Never Used Tobacco  Vaping Use   Vaping Use: Never used  Substance and Sexual Activity   Alcohol use: No    Alcohol/week: 0.0 standard drinks of alcohol   Drug use: No   Sexual activity: Yes    Partners: Male    Comment: Hysterectomy  Other Topics Concern   Not on file  Social History Narrative   09/21/19   From: [place], [place] originally   Living: with husband,  [name] and 2 daughters w/ family   Work: homemaker      Family: 3 children - [place], [place], and Museum/gallery conservator. Has 8 grandchildren and one on the way      Enjoys: spend time with family, shopping      Exercise: trying to walk more   Diet: eats late at night, will go most of the day w/o eating      Safety   Seat belts: Yes    Guns: Yes  and secure   Safe in relationships: Yes    Social Determinants of Health   Financial Resource Strain: Not on file  Food Insecurity: Not on file  Transportation Needs: Not on file  Physical Activity: Not on file  Stress: Not on file  Social Connections: Not  on file     Family History: The patient's family history includes Arrhythmia in her mother; Arthritis in her mother; Breast cancer (age of onset: 49) in her maternal aunt; Breast cancer (age of onset: 91) in her mother; Colon polyps in her father; Diabetes in her mother; Heart disease in her mother; Hyperlipidemia in her mother; Hypertension in her father and mother; Kidney cancer in her father; Lymphoma in her father; Ovarian cancer in her paternal grandmother; Stroke (age of onset: 55) in her maternal grandmother. There is no history of Colon cancer.  ROS:   Review of Systems  Constitution: Negative for decreased appetite, fever and weight gain.  HENT: Negative for congestion, ear discharge, hoarse voice and sore throat.   Eyes: Negative for discharge, redness, vision loss in right eye and visual halos.  Cardiovascular: Negative for chest pain, dyspnea on exertion, leg swelling, orthopnea and palpitations.  Respiratory: Negative for cough, hemoptysis, shortness of breath and snoring.   Endocrine: Negative for heat intolerance and polyphagia.  Hematologic/Lymphatic: Negative for bleeding problem. Does not bruise/bleed easily.  Skin: Negative for flushing, nail changes, rash and suspicious lesions.  Musculoskeletal: Negative for arthritis, joint pain, muscle cramps, myalgias, neck pain and stiffness.   Gastrointestinal: Negative for abdominal pain, bowel incontinence, diarrhea and excessive appetite.  Genitourinary: Negative for decreased libido, genital sores and incomplete emptying.  Neurological: Negative for brief paralysis, focal weakness, headaches and loss of balance.  Psychiatric/Behavioral: Negative for altered mental status, depression and suicidal ideas.  Allergic/Immunologic: Negative for HIV exposure and persistent infections.    EKGs/Labs/Other Studies Reviewed:    The following studies were reviewed today:   EKG:  None today   Recent Labs: 01/13/2022: ALT 17; BUN 18; Creatinine, Ser 0.67; Hemoglobin 12.8; Platelets 389; Potassium 4.2; Sodium 138  Recent Lipid Panel    Component Value Date/Time   CHOL 191 08/07/2021 0900   TRIG 75.0 08/07/2021 0900   HDL 55.20 08/07/2021 0900   CHOLHDL 3 08/07/2021 0900   VLDL 15.0 08/07/2021 0900   LDLCALC 121 (H) 08/07/2021 0900    Physical Exam:    VS:  BP (!) 148/88   Pulse 60   Ht '5\' 3"'$  (1.6 m)   Wt 240 lb 3.2 oz (109 kg)   LMP 01/20/2011   SpO2 97%   BMI 42.55 kg/m     Wt Readings from Last 3 Encounters:  03/03/22 240 lb 6.4 oz (109 kg)  02/28/22 240 lb 3.2 oz (109 kg)  02/12/22 239 lb 6.4 oz (108.6 kg)     GEN: Well nourished, well developed in no acute distress HEENT: Normal NECK: No JVD; No carotid bruits LYMPHATICS: No lymphadenopathy CARDIAC: S1S2 noted,RRR, no murmurs, rubs, gallops RESPIRATORY:  Clear to auscultation without rales, wheezing or rhonchi  ABDOMEN: Soft, non-tender, non-distended, +bowel sounds, no guarding. EXTREMITIES: No edema, No cyanosis, no clubbing MUSCULOSKELETAL:  No deformity  SKIN: Warm and dry NEUROLOGIC:  Alert and oriented x 3, non-focal PSYCHIATRIC:  Normal affect, good insight  ASSESSMENT:    1. Connective tissue disease ([place])   2. Obstructive sleep apnea   3. PAF (paroxysmal atrial fibrillation) ([place])   4. Primary hypertension   5. Morbid obesity ([place])   6.  Essential hypertension   7. Transient complete heart block ([facility])    PLAN:     PAF - will start low dose toprol, hopefully this will decrease the episodes of PAF. We will see what shows on her next device check. We will optimize the toprol  as appropraite. OSA - will sent for sleep study to reasses for need for cpap 3.  Bp slightly elevated, hoping that start of toprol will also improve the bp 4.  The patient understands the need to lose weight with diet and exercise. We have discussed specific strategies for this. The patient is in agreement with the above plan. The patient left the office in stable condition.  The patient will follow up in   Medication Adjustments/Labs and Tests Ordered: Current medicines are reviewed at length with the patient today.  Concerns regarding medicines are outlined above.  Orders Placed This Encounter  Procedures   Ambulatory referral to Rheumatology   Split night study   Meds ordered this encounter  Medications   metoprolol succinate (TOPROL XL) 25 MG 24 hr tablet    Sig: Take 0.5 tablets (12.5 mg total) by mouth daily.    Dispense:  45 tablet    Refill:  3    Patient Instructions  Medication Instructions:  Your physician has recommended you make the following change in your medication:  START: Toprol- XL (Metoprolol succinate) 12.5 mg once daily *If you need a refill on your cardiac medications before your next appointment, please call your pharmacy*   Lab Work: None  Testing/Procedures: Your physician has recommended that you have a sleep study. This test records several body functions during sleep, including: brain activity, eye movement, oxygen and carbon dioxide blood levels, heart rate and rhythm, breathing rate and rhythm, the flow of air through your mouth and nose, snoring, body muscle movements, and chest and belly movement.    Follow-Up: At [facility], you and your health needs are our priority.  As part of our continuing  mission to provide you with exceptional heart care, we have created designated Provider Care Teams.  These Care Teams include your primary Cardiologist (physician) and Advanced Practice Providers (APPs -  Physician Assistants and Nurse Practitioners) who all work together to provide you with the care you need, when you need it.  We recommend signing up for the patient portal called "MyChart".  Sign up information is provided on this After Visit Summary.  MyChart is used to connect with patients for Virtual Visits (Telemedicine).  Patients are able to view lab/test results, encounter notes, upcoming appointments, etc.  Non-urgent messages can be sent to your provider as well.   To learn more about what you can do with MyChart, go to [email or web address].    Your next appointment:   6 month(s)  The format for your next appointment:   In Person  Provider:   Berniece Salines, DO     Other Instructions   Important Information About [place] refers to food and lifestyle choices that are based on the traditions of countries located on the [employer]. It focuses on eating more fruits, vegetables, whole grains, beans, nuts, seeds, and heart-healthy fats, and eating less dairy, meat, eggs, and processed foods with added sugar, salt, and fat. This way of eating has been shown to help prevent certain conditions and improve outcomes for people who have chronic diseases, like kidney disease and heart disease. What are tips for following this plan? Reading food labels Check the serving size of packaged foods. For foods such as rice and pasta, the serving size refers to the amount of cooked product, not dry. Check the total fat in packaged foods. Avoid foods that have saturated fat or trans fats. Check the  ingredient list for added sugars, such as corn syrup. Shopping  Buy a variety of foods that offer a balanced diet, including: Fresh fruits and  vegetables (produce). Grains, beans, nuts, and seeds. Some of these may be available in unpackaged forms or large amounts (in bulk). Fresh seafood. Poultry and eggs. Low-fat dairy products. Buy whole ingredients instead of prepackaged foods. Buy fresh fruits and vegetables in-season from local farmers markets. Buy plain frozen fruits and vegetables. If you do not have access to quality fresh seafood, buy precooked frozen shrimp or canned fish, such as tuna, salmon, or sardines. Stock your pantry so you always have certain foods on hand, such as olive oil, canned tuna, canned tomatoes, rice, pasta, and beans. Cooking Cook foods with extra-virgin olive oil instead of using butter or other vegetable oils. Have meat as a side dish, and have vegetables or grains as your main dish. This means having meat in small portions or adding small amounts of meat to foods like pasta or stew. Use beans or vegetables instead of meat in common dishes like chili or lasagna. Experiment with different cooking methods. Try roasting, broiling, steaming, and sauting vegetables. Add frozen vegetables to soups, stews, pasta, or rice. Add nuts or seeds for added healthy fats and plant protein at each meal. You can add these to yogurt, salads, or vegetable dishes. Marinate fish or vegetables using olive oil, lemon juice, garlic, and fresh herbs. Meal planning Plan to eat one vegetarian meal one day each week. Try to work up to two vegetarian meals, if possible. Eat seafood two or more times a week. Have healthy snacks readily available, such as: Vegetable sticks with hummus. Greek yogurt. Fruit and nut trail mix. Eat balanced meals throughout the week. This includes: Fruit: 2-3 servings a day. Vegetables: 4-5 servings a day. Low-fat dairy: 2 servings a day. Fish, poultry, or lean meat: 1 serving a day. Beans and legumes: 2 or more servings a week. Nuts and seeds: 1-2 servings a day. Whole grains: 6-8 servings a  day. Extra-virgin olive oil: 3-4 servings a day. Limit red meat and sweets to only a few servings a month. Lifestyle  Cook and eat meals together with your family, when possible. Drink enough fluid to keep your urine pale yellow. Be physically active every day. This includes: Aerobic exercise like running or swimming. Leisure activities like gardening, walking, or housework. Get 7-8 hours of sleep each night. If recommended by your health care provider, drink red wine in moderation. This means 1 glass a day for nonpregnant women and 2 glasses a day for men. A glass of wine equals 5 oz (150 mL). What foods should I eat? Fruits Apples. Apricots. Avocado. Berries. Bananas. Cherries. Dates. Figs. Grapes. Lemons. Melon. Oranges. Peaches. Plums. Pomegranate. Vegetables Artichokes. Beets. Broccoli. Cabbage. Carrots. Eggplant. Green beans. Chard. Kale. Spinach. Onions. Leeks. Peas. Squash. Tomatoes. Peppers. Radishes. Grains Whole-grain pasta. Brown rice. Bulgur wheat. Polenta. Couscous. Whole-wheat bread. [place]. Meats and other proteins Beans. Almonds. Sunflower seeds. Pine nuts. Peanuts. [place]. Salmon. Scallops. Shrimp. [place]. Tilapia. Clams. Oysters. Eggs. Poultry without skin. Dairy Low-fat milk. Cheese. Greek yogurt. Fats and oils Extra-virgin olive oil. Avocado oil. Grapeseed oil. Beverages Water. Red wine. Herbal tea. Sweets and desserts Greek yogurt with honey. Baked apples. Poached pears. Trail mix. Seasonings and condiments Basil. Cilantro. Coriander. Cumin. Mint. Parsley. Sage. Rosemary. Tarragon. Garlic. Oregano. Thyme. Pepper. Balsamic vinegar. Tahini. Hummus. Tomato sauce. Olives. Mushrooms. The items listed above may not be a complete list of foods  and beverages you can eat. Contact a dietitian for more information. What foods should I limit? This is a list of foods that should be eaten rarely or only on special occasions. Fruits Fruit canned in  syrup. Vegetables Deep-fried potatoes (french fries). Grains Prepackaged pasta or rice dishes. Prepackaged cereal with added sugar. Prepackaged snacks with added sugar. Meats and other proteins Beef. Pork. Lamb. Poultry with skin. Hot dogs. [name]. Dairy Ice cream. Sour cream. Whole milk. Fats and oils Butter. Canola oil. Vegetable oil. Beef fat (tallow). Lard. Beverages Juice. Sugar-sweetened soft drinks. Beer. Liquor and spirits. Sweets and desserts Cookies. Cakes. Pies. Candy. Seasonings and condiments Mayonnaise. Pre-made sauces and marinades. The items listed above may not be a complete list of foods and beverages you should limit. Contact a dietitian for more information. Summary The Mediterranean diet includes both food and lifestyle choices. Eat a variety of fresh fruits and vegetables, beans, nuts, seeds, and whole grains. Limit the amount of red meat and sweets that you eat. If recommended by your health care provider, drink red wine in moderation. This means 1 glass a day for nonpregnant women and 2 glasses a day for men. A glass of wine equals 5 oz (150 mL). This information is not intended to replace advice given to you by your health care provider. Make sure you discuss any questions you have with your health care provider. Document Revised: 06/17/2019 Document Reviewed: 04/14/2019 Elsevier Patient Education  [place].    Adopting a Healthy Lifestyle.  Know what a healthy weight is for you (roughly BMI <25) and aim to maintain this   Aim for 7+ servings of fruits and vegetables daily   65-80+ fluid ounces of water or unsweet tea for healthy kidneys   Limit to max 1 drink of alcohol per day; avoid smoking/tobacco   Limit animal fats in diet for cholesterol and heart health - choose grass fed whenever available   Avoid highly processed foods, and foods high in saturated/trans fats   Aim for low stress - take time to unwind and care for your mental  health   Aim for 150 min of moderate intensity exercise weekly for heart health, and weights twice weekly for bone health   Aim for 7-9 hours of sleep daily   When it comes to diets, agreement about the perfect plan isnt easy to find, even among the experts. Experts at the [place] developed an idea known as the Healthy Eating Plate. Just imagine a plate divided into logical, healthy portions.   The emphasis is on diet quality:   Load up on vegetables and fruits - one-half of your plate: Aim for color and variety, and remember that potatoes dont count.   Go for whole grains - one-quarter of your plate: Whole wheat, barley, wheat berries, quinoa, oats, brown rice, and foods made with them. If you want pasta, go with whole wheat pasta.   Protein power - one-quarter of your plate: Fish, chicken, beans, and nuts are all healthy, versatile protein sources. Limit red meat.   The diet, however, does go beyond the plate, offering a few other suggestions.   Use healthy plant oils, such as olive, canola, soy, corn, sunflower and peanut. Check the labels, and avoid partially hydrogenated oil, which have unhealthy trans fats.   If youre thirsty, drink water. Coffee and tea are good in moderation, but skip sugary drinks and limit milk and dairy products to one or two daily servings.  The type of carbohydrate in the diet is more important than the amount. Some sources of carbohydrates, such as vegetables, fruits, whole grains, and beans-are healthier than others.   Finally, stay active  Signed, [name] Salines, DO  03/06/2022 2:09 PM    [place]

## 2022-02-28 NOTE — Patient Instructions (Addendum)
Medication Instructions:  Your physician has recommended you make the following change in your medication:  START: Toprol- XL (Metoprolol succinate) 12.5 mg once daily *If you need a refill on your cardiac medications before your next appointment, please call your pharmacy*   Lab Work: None  Testing/Procedures: Your physician has recommended that you have a sleep study. This test records several body functions during sleep, including: brain activity, eye movement, oxygen and carbon dioxide blood levels, heart rate and rhythm, breathing rate and rhythm, the flow of air through your mouth and nose, snoring, body muscle movements, and chest and belly movement.    Follow-Up: At [facility], you and your health needs are our priority.  As part of our continuing mission to provide you with exceptional heart care, we have created designated Provider Care Teams.  These Care Teams include your primary Cardiologist (physician) and Advanced Practice Providers (APPs -  Physician Assistants and Nurse Practitioners) who all work together to provide you with the care you need, when you need it.  We recommend signing up for the patient portal called "MyChart".  Sign up information is provided on this After Visit Summary.  MyChart is used to connect with patients for Virtual Visits (Telemedicine).  Patients are able to view lab/test results, encounter notes, upcoming appointments, etc.  Non-urgent messages can be sent to your provider as well.   To learn more about what you can do with MyChart, go to [email or web address].    Your next appointment:   6 month(s)  The format for your next appointment:   In Person  Provider:   Berniece Salines, DO     Other Instructions   Important Information About [place] refers to food and lifestyle choices that are based on the traditions of countries located on the [employer]. It focuses on eating more  fruits, vegetables, whole grains, beans, nuts, seeds, and heart-healthy fats, and eating less dairy, meat, eggs, and processed foods with added sugar, salt, and fat. This way of eating has been shown to help prevent certain conditions and improve outcomes for people who have chronic diseases, like kidney disease and heart disease. What are tips for following this plan? Reading food labels Check the serving size of packaged foods. For foods such as rice and pasta, the serving size refers to the amount of cooked product, not dry. Check the total fat in packaged foods. Avoid foods that have saturated fat or trans fats. Check the ingredient list for added sugars, such as corn syrup. Shopping  Buy a variety of foods that offer a balanced diet, including: Fresh fruits and vegetables (produce). Grains, beans, nuts, and seeds. Some of these may be available in unpackaged forms or large amounts (in bulk). Fresh seafood. Poultry and eggs. Low-fat dairy products. Buy whole ingredients instead of prepackaged foods. Buy fresh fruits and vegetables in-season from local farmers markets. Buy plain frozen fruits and vegetables. If you do not have access to quality fresh seafood, buy precooked frozen shrimp or canned fish, such as tuna, salmon, or sardines. Stock your pantry so you always have certain foods on hand, such as olive oil, canned tuna, canned tomatoes, rice, pasta, and beans. Cooking Cook foods with extra-virgin olive oil instead of using butter or other vegetable oils. Have meat as a side dish, and have vegetables or grains as your main dish. This means having meat in small portions or adding small amounts of meat to foods  like pasta or stew. Use beans or vegetables instead of meat in common dishes like chili or lasagna. Experiment with different cooking methods. Try roasting, broiling, steaming, and sauting vegetables. Add frozen vegetables to soups, stews, pasta, or rice. Add nuts or seeds for  added healthy fats and plant protein at each meal. You can add these to yogurt, salads, or vegetable dishes. Marinate fish or vegetables using olive oil, lemon juice, garlic, and fresh herbs. Meal planning Plan to eat one vegetarian meal one day each week. Try to work up to two vegetarian meals, if possible. Eat seafood two or more times a week. Have healthy snacks readily available, such as: Vegetable sticks with hummus. Greek yogurt. Fruit and nut trail mix. Eat balanced meals throughout the week. This includes: Fruit: 2-3 servings a day. Vegetables: 4-5 servings a day. Low-fat dairy: 2 servings a day. Fish, poultry, or lean meat: 1 serving a day. Beans and legumes: 2 or more servings a week. Nuts and seeds: 1-2 servings a day. Whole grains: 6-8 servings a day. Extra-virgin olive oil: 3-4 servings a day. Limit red meat and sweets to only a few servings a month. Lifestyle  Cook and eat meals together with your family, when possible. Drink enough fluid to keep your urine pale yellow. Be physically active every day. This includes: Aerobic exercise like running or swimming. Leisure activities like gardening, walking, or housework. Get 7-8 hours of sleep each night. If recommended by your health care provider, drink red wine in moderation. This means 1 glass a day for nonpregnant women and 2 glasses a day for men. A glass of wine equals 5 oz (150 mL). What foods should I eat? Fruits Apples. Apricots. Avocado. Berries. Bananas. Cherries. Dates. Figs. Grapes. Lemons. Melon. Oranges. Peaches. Plums. Pomegranate. Vegetables Artichokes. Beets. Broccoli. Cabbage. Carrots. Eggplant. Green beans. Chard. Kale. Spinach. Onions. Leeks. Peas. Squash. Tomatoes. Peppers. Radishes. Grains Whole-grain pasta. Brown rice. Bulgur wheat. Polenta. Couscous. Whole-wheat bread. [place]. Meats and other proteins Beans. Almonds. Sunflower seeds. Pine nuts. Peanuts. [place]. Salmon. Scallops. Shrimp.  [place]. Tilapia. Clams. Oysters. Eggs. Poultry without skin. Dairy Low-fat milk. Cheese. Greek yogurt. Fats and oils Extra-virgin olive oil. Avocado oil. Grapeseed oil. Beverages Water. Red wine. Herbal tea. Sweets and desserts Greek yogurt with honey. Baked apples. Poached pears. Trail mix. Seasonings and condiments Basil. Cilantro. Coriander. Cumin. Mint. Parsley. Sage. Rosemary. Tarragon. Garlic. Oregano. Thyme. Pepper. Balsamic vinegar. Tahini. Hummus. Tomato sauce. Olives. Mushrooms. The items listed above may not be a complete list of foods and beverages you can eat. Contact a dietitian for more information. What foods should I limit? This is a list of foods that should be eaten rarely or only on special occasions. Fruits Fruit canned in syrup. Vegetables Deep-fried potatoes (french fries). Grains Prepackaged pasta or rice dishes. Prepackaged cereal with added sugar. Prepackaged snacks with added sugar. Meats and other proteins Beef. Pork. Lamb. Poultry with skin. Hot dogs. Berniece Salines. Dairy Ice cream. Sour cream. Whole milk. Fats and oils Butter. Canola oil. Vegetable oil. Beef fat (tallow). Lard. Beverages Juice. Sugar-sweetened soft drinks. Beer. Liquor and spirits. Sweets and desserts Cookies. Cakes. Pies. Candy. Seasonings and condiments Mayonnaise. Pre-made sauces and marinades. The items listed above may not be a complete list of foods and beverages you should limit. Contact a dietitian for more information. Summary The Mediterranean diet includes both food and lifestyle choices. Eat a variety of fresh fruits and vegetables, beans, nuts, seeds, and whole grains. Limit the amount of red meat and  sweets that you eat. If recommended by your health care provider, drink red wine in moderation. This means 1 glass a day for nonpregnant women and 2 glasses a day for men. A glass of wine equals 5 oz (150 mL). This information is not intended to replace advice given to you by your  health care provider. Make sure you discuss any questions you have with your health care provider. Document Revised: 06/17/2019 Document Reviewed: 04/14/2019 Elsevier Patient Education  [place].

## 2022-03-03 ENCOUNTER — Other Ambulatory Visit: Payer: Self-pay

## 2022-03-03 ENCOUNTER — Ambulatory Visit (INDEPENDENT_AMBULATORY_CARE_PROVIDER_SITE_OTHER): Payer: 59 | Admitting: Internal Medicine

## 2022-03-03 ENCOUNTER — Encounter: Payer: Self-pay | Admitting: Internal Medicine

## 2022-03-03 VITALS — BP 140/75 | HR 61 | Temp 98.3°F | Wt 240.4 lb

## 2022-03-03 DIAGNOSIS — M351 Other overlap syndromes: Secondary | ICD-10-CM

## 2022-03-03 DIAGNOSIS — D75839 Thrombocytosis, unspecified: Secondary | ICD-10-CM | POA: Diagnosis not present

## 2022-03-03 DIAGNOSIS — E039 Hypothyroidism, unspecified: Secondary | ICD-10-CM

## 2022-03-03 DIAGNOSIS — E538 Deficiency of other specified B group vitamins: Secondary | ICD-10-CM

## 2022-03-03 DIAGNOSIS — E559 Vitamin D deficiency, unspecified: Secondary | ICD-10-CM

## 2022-03-03 DIAGNOSIS — I4891 Unspecified atrial fibrillation: Secondary | ICD-10-CM

## 2022-03-03 DIAGNOSIS — Z950 Presence of cardiac pacemaker: Secondary | ICD-10-CM

## 2022-03-03 DIAGNOSIS — I10 Essential (primary) hypertension: Secondary | ICD-10-CM

## 2022-03-03 MED ORDER — LEVOTHYROXINE SODIUM 88 MCG PO TABS: 88.0000 ug | ORAL_TABLET | Freq: Every day | ORAL | 3 refills | Status: DC

## 2022-03-03 MED ORDER — LOSARTAN POTASSIUM 100 MG PO TABS: 100.0000 mg | ORAL_TABLET | Freq: Every day | ORAL | 3 refills | Status: DC

## 2022-03-03 NOTE — Progress Notes (Signed)
New Patient Office Visit     CC/Reason for Visit: Establish care, discuss chronic conditions Previous PCP: Waunita Schooner, MD Last Visit: Earlier this year  HPI: [name] is a 56 y.o. female who is coming in today for the above mentioned reasons. Past Medical History is significant for: Intermittent heart block status post pacemaker earlier this year, hypertension, hypothyroidism, essential thrombocytosis and mixed connective tissue disorder.  She is also obese.  She does not smoke, she does not drink, she has no known drug allergies.  Her surgical history is significant for back fusion in 2013, Roux-en-Y bypass in 2012, hysterectomy.  Family history significant for mother with coronary artery disease and breast cancer, father with lymphoma.  She had a colonoscopy in 2019 and was given a 10-year callback schedule.  She is scheduled to see GYN later this month and will have Pap smear and mammogram then.  She sees Dr. Charlesetta Garibaldi with [facility].   Past Medical/Surgical History: Past Medical History:  Diagnosis Date   Allergic rhinitis, cause unspecified    Allergy 2016   Anemia, iron deficiency    Arthritis    COVID-19 92/03/9416   Diastolic dysfunction    Diverticulosis of colon    GERD (gastroesophageal reflux disease)    Goiter    Hiatal hernia    Hypertension    IBS (irritable bowel syndrome)    Morbid obesity ([place])    Obstructive sleep apnea    Sleep apnea    Thrombocytosis    Undiagnosed cardiac murmurs     Past Surgical History:  Procedure Laterality Date   ABDOMINAL HYSTERECTOMY  05/2013   BARIATRIC SURGERY  06/17/2010   BILATERAL SALPINGECTOMY Bilateral 06/22/2014   Procedure: BILATERAL SALPINGECTOMY;  Surgeon: Crawford Givens, MD;  Location: [facility];  Service: Gynecology;  Laterality: Bilateral;   CYSTOSCOPY N/A 06/22/2014   Procedure: CYSTOSCOPY;  Surgeon: Crawford Givens, MD;  Location: [facility];  Service: Gynecology;  Laterality: N/A;   ENDOMETRIAL  ABLATION  12/08   FOOT SURGERY Left 11/24/2017   Dr Paulla Dolly   LAPAROSCOPIC ASSISTED VAGINAL HYSTERECTOMY N/A 06/22/2014   Procedure: LAPAROSCOPIC ASSISTED VAGINAL HYSTERECTOMY;  Surgeon: Crawford Givens, MD;  Location: [facility];  Service: Gynecology;  Laterality: N/A;   LEAD REVISION/REPAIR N/A 12/17/2021   Procedure: LEAD REVISION/REPAIR;  Surgeon: Aamodt Lance, MD;  Location: [facility];  Service: Cardiovascular;  Laterality: N/A;   LIPOMA EXCISION     upper back   PACEMAKER IMPLANT N/A 12/09/2021   Procedure: PACEMAKER IMPLANT;  Surgeon: Rosenow Lance, MD;  Location: [facility];  Service: Cardiovascular;  Laterality: N/A;   SPINE SURGERY  02/10/2012   L4-l5 fusion--cohen    Social History:  reports that she has never smoked. She has never been exposed to tobacco smoke. She has never used smokeless tobacco. She reports that she does not drink alcohol and does not use drugs.  Allergies: Allergies  Allergen Reactions   Penicillin G Other (See Comments)   Penicillins Other (See Comments)    Causes severe, difficult to treat yeast infections    Family History:  Family History  Problem Relation Age of Onset   Breast cancer Mother 56   Arrhythmia Mother    Heart disease Mother    Arthritis Mother    Diabetes Mother    Hyperlipidemia Mother    Hypertension Mother    Hypertension Father    Kidney cancer Father    Colon polyps Father  Lymphoma Father        Diffused Large B-cell Lymphoma   Breast cancer Maternal Aunt 58   Stroke Maternal Grandmother 85   Ovarian cancer Paternal Grandmother    Colon cancer Neg Hx      Current Outpatient Medications:    acetaminophen (TYLENOL) 325 MG tablet, Take 1-2 tablets (325-650 mg total) by mouth every 4 (four) hours as needed for mild pain., Disp: , Rfl:    Biotin 1000 MCG tablet, Take 1,000 mcg by mouth every other day., Disp: , Rfl:    Cholecalciferol (VITAMIN D3) 50 MCG (2000 UT) TABS, Take 2,000 Units by mouth daily.,  Disp: , Rfl:    colchicine 0.6 MG tablet, Take 1 tablet (0.6 mg total) by mouth 2 (two) times daily. (Patient taking differently: Take 0.6 mg by mouth as needed.), Disp: 60 tablet, Rfl: 3   famotidine (PEPCID) 40 MG tablet, Take 40 mg by mouth 2 (two) times daily., Disp: , Rfl:    fluticasone (FLONASE) 50 MCG/ACT nasal spray, Place 2 sprays into both nostrils daily. (Patient taking differently: Place 2 sprays into both nostrils daily as needed for allergies.), Disp: 16 g, Rfl: 6   Glucosamine HCl (GLUCOSAMINE PO), Take 1,000 mg by mouth daily., Disp: , Rfl:    hydrochlorothiazide (HYDRODIURIL) 25 MG tablet, TAKE 1 TABLET (25 MG TOTAL) BY MOUTH DAILY., Disp: 90 tablet, Rfl: 3   Ibuprofen 200 MG CAPS, Take 400 mg by mouth daily as needed (For pain)., Disp: , Rfl:    KLOR-CON M20 20 MEQ tablet, TAKE 1 TABLET BY MOUTH EVERY DAY, Disp: 90 tablet, Rfl: 3   loratadine (CLARITIN) 10 MG tablet, TAKE 1 TABLET BY MOUTH EVERY DAY (Patient taking differently: Take 10 mg by mouth daily.), Disp: 90 tablet, Rfl: 2   Magnesium 250 MG TABS, Take 250 mg by mouth daily. For leg cramps, Disp: , Rfl:    Menthol, Topical Analgesic, (BIOFREEZE EX), Apply 1 application  topically daily as needed (On Knees and feet)., Disp: , Rfl:    metoprolol succinate (TOPROL XL) 25 MG 24 hr tablet, Take 0.5 tablets (12.5 mg total) by mouth daily., Disp: 45 tablet, Rfl: 3   metoprolol tartrate (LOPRESSOR) 25 MG tablet, Take 25 mg by mouth 2 (two) times daily. 1/2 tab a day, Disp: , Rfl:    Misc Natural Products (YUMVS BEET ROOT-TART CHERRY) 250-0.5 MG CHEW, Chew 1 tablet by mouth 2 (two) times a week., Disp: , Rfl:    omeprazole (PRILOSEC) 40 MG capsule, Take 1 capsule (40 mg total) by mouth daily. (Patient taking differently: Take 40 mg by mouth daily as needed (reflux).), Disp: 90 capsule, Rfl: 1   Turmeric 500 MG CAPS, Take 500 mg by mouth daily., Disp: , Rfl:    celecoxib (CELEBREX) 200 MG capsule, Take 200 mg by mouth daily as needed  for mild pain or moderate pain. (Patient not taking: Reported on 03/03/2022), Disp: , Rfl:    levothyroxine (SYNTHROID) 88 MCG tablet, Take 1 tablet (88 mcg total) by mouth daily., Disp: 90 tablet, Rfl: 3   losartan (COZAAR) 100 MG tablet, Take 1 tablet (100 mg total) by mouth daily., Disp: 90 tablet, Rfl: 3  Review of Systems:  Constitutional: Denies fever, chills, diaphoresis, appetite change and fatigue.  HEENT: Denies photophobia, eye pain, redness, hearing loss, ear pain, congestion, sore throat, rhinorrhea, sneezing, mouth sores, trouble swallowing, neck pain, neck stiffness and tinnitus.   Respiratory: Denies SOB, DOE, cough, chest tightness,  and wheezing.  Cardiovascular: Denies chest pain, palpitations and leg swelling.  Gastrointestinal: Denies nausea, vomiting, abdominal pain, diarrhea, constipation, blood in stool and abdominal distention.  Genitourinary: Denies dysuria, urgency, frequency, hematuria, flank pain and difficulty urinating.  Endocrine: Denies: hot or cold intolerance, sweats, changes in hair or nails, polyuria, polydipsia. Musculoskeletal: Denies myalgias, back pain, joint swelling, arthralgias and gait problem.  Skin: Denies pallor, rash and wound.  Neurological: Denies dizziness, seizures, syncope, weakness, light-headedness, numbness and headaches.  Hematological: Denies adenopathy. Easy bruising, personal or family bleeding history  Psychiatric/Behavioral: Denies suicidal ideation, mood changes, confusion, nervousness, sleep disturbance and agitation    Physical Exam: Vitals:   03/03/22 1305 03/03/22 1349  BP: (!) 142/78 (!) 140/75  Pulse: 61   Temp: 98.3 F (36.8 C)   TempSrc: Oral   SpO2: 98%   Weight: 240 lb 6.4 oz (109 kg)    Body mass index is 42.58 kg/m.  Constitutional: NAD, calm, comfortable Eyes: PERRL, lids and conjunctivae normal ENMT: Mucous membranes are moist.  Respiratory: clear to auscultation bilaterally, no wheezing, no crackles.  Normal respiratory effort. No accessory muscle use.  Cardiovascular: Regular rate and rhythm, no murmurs / rubs / gallops. No extremity edema.  Skin: Pacemaker site is well-healed with no sign of active infection. Neurologic: CN 2-12 grossly intact. Sensation intact, DTR normal. Strength 5/5 in all 4.  Psychiatric: Normal judgment and insight. Alert and oriented x 3. Normal mood.    Impression and Plan:  Pacemaker  Atrial fibrillation, unspecified type ([place])  Thrombocytosis  Vitamin D deficiency  Acquired hypothyroidism  MCTD (mixed connective tissue disease) ([place])  Vitamin B12 deficiency  MORBID OBESITY  -She declines flu and shingles vaccines but will reconsider for next visit. -She was just started on metoprolol, hopefully this will help improve blood pressure.  She has regular follow-ups both with cardiology and with the EP. -Check lipids, vitamin levels and thyroid levels at next visit. -She used to be seeing Dr. Trudie Reed with rheumatology, is in the process of finding a new rheumatologist. -Previous charts have been reviewed in great detail. -Discussed healthy lifestyle, including increased physical activity and better food choices to promote weight loss. -She follows with Dr. Marin Olp for her thrombocytosis.   Time spent: 45 minutes reviewing chart, interviewing and examining patient and formulating plan of care.      Lelon Frohlich, MD [facility] at [facility]

## 2022-03-06 ENCOUNTER — Encounter: Payer: Self-pay | Admitting: Cardiology

## 2022-03-12 ENCOUNTER — Ambulatory Visit (INDEPENDENT_AMBULATORY_CARE_PROVIDER_SITE_OTHER): Payer: 59

## 2022-03-12 DIAGNOSIS — I442 Atrioventricular block, complete: Secondary | ICD-10-CM

## 2022-03-12 LAB — CUP PACEART REMOTE DEVICE CHECK
Battery Remaining Longevity: 174 mo
Battery Remaining Percentage: 100 %
Brady Statistic RA Percent Paced: 32 %
Brady Statistic RV Percent Paced: 0 %
Date Time Interrogation Session: 20231018043100
Implantable Lead Implant Date: 20230717
Implantable Lead Implant Date: 20230717
Implantable Lead Location: 753859
Implantable Lead Location: 753860
Implantable Lead Model: 7841
Implantable Lead Model: 7842
Implantable Lead Serial Number: 1204466
Implantable Lead Serial Number: 1298900
Implantable Pulse Generator Implant Date: 20230717
Lead Channel Impedance Value: 725 Ohm
Lead Channel Impedance Value: 732 Ohm
Lead Channel Pacing Threshold Amplitude: 0.6 V
Lead Channel Pacing Threshold Pulse Width: 0.4 ms
Lead Channel Setting Pacing Amplitude: 2.5 V
Lead Channel Setting Pacing Amplitude: 3.3 V
Lead Channel Setting Pacing Pulse Width: 1 ms
Lead Channel Setting Sensing Sensitivity: 1 mV
Pulse Gen Serial Number: 603605

## 2022-03-14 ENCOUNTER — Encounter: Payer: 59 | Admitting: Cardiology

## 2022-03-18 ENCOUNTER — Other Ambulatory Visit: Payer: Self-pay | Admitting: Cardiology

## 2022-03-18 ENCOUNTER — Telehealth: Payer: Self-pay | Admitting: *Deleted

## 2022-03-18 DIAGNOSIS — I10 Essential (primary) hypertension: Secondary | ICD-10-CM

## 2022-03-18 DIAGNOSIS — G4733 Obstructive sleep apnea (adult) (pediatric): Secondary | ICD-10-CM

## 2022-03-18 DIAGNOSIS — I4891 Unspecified atrial fibrillation: Secondary | ICD-10-CM

## 2022-03-18 NOTE — Telephone Encounter (Signed)
Prior Authorization for split night sleep study sent to [facility] via web portal.Tracking Number [id number].  Request was denied. Per Dr Godfrey Pick Tobb ok to switch to HST.

## 2022-03-20 ENCOUNTER — Encounter: Payer: 59 | Admitting: Internal Medicine

## 2022-03-20 DIAGNOSIS — Z1231 Encounter for screening mammogram for malignant neoplasm of breast: Secondary | ICD-10-CM | POA: Diagnosis not present

## 2022-03-20 DIAGNOSIS — Z90711 Acquired absence of uterus with remaining cervical stump: Secondary | ICD-10-CM | POA: Diagnosis not present

## 2022-03-20 DIAGNOSIS — Z01419 Encounter for gynecological examination (general) (routine) without abnormal findings: Secondary | ICD-10-CM | POA: Diagnosis not present

## 2022-03-24 ENCOUNTER — Encounter: Payer: 59 | Admitting: Internal Medicine

## 2022-03-25 NOTE — Progress Notes (Signed)
Remote pacemaker transmission.   

## 2022-03-27 ENCOUNTER — Telehealth: Payer: Self-pay

## 2022-03-27 NOTE — Telephone Encounter (Signed)
Transmission received. Dr. Harriet Masson needs it.

## 2022-04-15 ENCOUNTER — Ambulatory Visit (HOSPITAL_BASED_OUTPATIENT_CLINIC_OR_DEPARTMENT_OTHER): Payer: 59 | Attending: Cardiology | Admitting: Cardiology

## 2022-04-15 DIAGNOSIS — I10 Essential (primary) hypertension: Secondary | ICD-10-CM | POA: Diagnosis not present

## 2022-04-15 DIAGNOSIS — I4891 Unspecified atrial fibrillation: Secondary | ICD-10-CM | POA: Insufficient documentation

## 2022-04-15 DIAGNOSIS — G4733 Obstructive sleep apnea (adult) (pediatric): Secondary | ICD-10-CM | POA: Insufficient documentation

## 2022-04-28 NOTE — Procedures (Signed)
   Patient Name: [name], Gibbs Study Date: 04/18/2022 Gender: Female D.O.B: 11-05-1965 Age (years): 11 Referring Provider: Godfrey Pick Tobb DO Height (inches): 75 Interpreting Physician: Fransico Him MD, ABSM Weight (lbs): 239 RPSGT: Jacolyn Reedy BMI: 41 MRN: [id number] Neck Size: 16.00  CLINICAL INFORMATION Sleep Study Type: HST  Indication for sleep study: Obesity, Snoring  Epworth Sleepiness Score: 6  Most recent polysomnogram dated 11/22/2015 revealed an AHI of 1.2/h and RDI of 17.0/h.  SLEEP STUDY TECHNIQUE A multi-channel overnight portable sleep study was performed. The channels recorded were: nasal airflow, thoracic respiratory movement, and oxygen saturation with a pulse oximetry. Snoring was also monitored.  MEDICATIONS Patient self administered medications include: N/A.  SLEEP ARCHITECTURE Patient was studied for 370.5 minutes. The sleep efficiency was 100.0 % and the patient was supine for 0%. The arousal index was 0.0 per hour.  RESPIRATORY PARAMETERS The overall AHI was 13.0 per hour, with a central apnea index of 0 per hour.  The oxygen nadir was 91% during sleep.  CARDIAC DATA Mean heart rate during sleep was 63.8 bpm.  IMPRESSIONS - Mild obstructive sleep apnea occurred during this study (AHI = 13.0/h). - The patient had minimal or no oxygen desaturation during the study (Min O2 = 91%) - Patient snored 25.3% during the sleep.  DIAGNOSIS - Obstructive Sleep Apnea (G47.33) RECOMMENDATIONS - Therapeutic CPAP titration to determine optimal pressure required to alleviate sleep disordered breathing. - Positional therapy avoiding supine position during sleep. - Avoid alcohol, sedatives and other CNS depressants that may worsen sleep apnea and disrupt normal sleep architecture. - Sleep hygiene should be reviewed to assess factors that may improve sleep quality. - Weight management and regular exercise should be initiated or continued.  [Electronically  signed] 04/28/2022 08:15 PM  Fransico Him MD, ABSM Diplomate, American Board of Sleep Medicine

## 2022-05-20 ENCOUNTER — Telehealth: Payer: Self-pay | Admitting: *Deleted

## 2022-05-20 DIAGNOSIS — I10 Essential (primary) hypertension: Secondary | ICD-10-CM

## 2022-05-20 DIAGNOSIS — G4733 Obstructive sleep apnea (adult) (pediatric): Secondary | ICD-10-CM

## 2022-05-20 NOTE — Telephone Encounter (Signed)
The patient has been notified of the result and verbalized understanding.  All questions (if any) were answered. Marolyn Hammock, [place] 05/20/2022 2:33 PM     Pt is aware and agreeable to the titration, will precert

## 2022-05-20 NOTE — Telephone Encounter (Signed)
-----   Message from Lauralee Evener, [place] sent at 04/29/2022 10:27 AM EST -----  ----- Message ----- From: Sueanne Margarita, MD Sent: 04/28/2022   8:20 PM EST To: [facility] Sleep Studies  Please let patient know that they have sleep apnea.  Recommend therapeutic CPAP titration for treatment of patient's sleep disordered breathing.  If unable to perform an in lab titration then initiate ResMed auto CPAP from 4 to 15cm H2O with heated humidity and mask of choice and overnight pulse ox on CPAP.

## 2022-05-22 ENCOUNTER — Encounter: Payer: Self-pay | Admitting: Cardiology

## 2022-05-27 ENCOUNTER — Encounter: Payer: Self-pay | Admitting: Internal Medicine

## 2022-05-27 ENCOUNTER — Ambulatory Visit: Payer: 59 | Attending: Internal Medicine | Admitting: Internal Medicine

## 2022-05-27 ENCOUNTER — Encounter: Payer: Self-pay | Admitting: Cardiology

## 2022-05-27 VITALS — BP 126/78 | HR 60 | Ht 63.0 in | Wt 236.8 lb

## 2022-05-27 DIAGNOSIS — I471 Supraventricular tachycardia, unspecified: Secondary | ICD-10-CM

## 2022-05-27 MED ORDER — METOPROLOL SUCCINATE ER 25 MG PO TB24: 25.0000 mg | ORAL_TABLET | Freq: Every day | ORAL | 3 refills | Status: DC

## 2022-05-27 NOTE — Progress Notes (Signed)
HPI [name] returns today for followup. She is a pleasant 57 yo woman with intermittent AV block who underwent PPM insertion complicated by lead migration and asymptomatic perforation. She underwent insertion of a new lead. She had problems with significant cardiac awareness and workup was unremarkable. She was noted to have an initial elevation in her pacing threshold. Overall she feels better. She still has occaisional sensation of a sharp sensation in the chest that we were able to reproduce with VVI pacing, but not DDD pacing.  She feels well and has lost 3 lbs but notes that she will occaisionally have a sensation in her chest which we initially thought were PVC's but appears to be due to SVT, likely atrial tachycardia. She was started on Metoprolol and has tolerated well.  Allergies  Allergen Reactions   Penicillin G Other (See Comments)   Penicillins Other (See Comments)    Causes severe, difficult to treat yeast infections     Current Outpatient Medications  Medication Sig Dispense Refill   acetaminophen (TYLENOL) 325 MG tablet Take 1-2 tablets (325-650 mg total) by mouth every 4 (four) hours as needed for mild pain.     Biotin 1000 MCG tablet Take 1,000 mcg by mouth every other day.     celecoxib (CELEBREX) 200 MG capsule Take 200 mg by mouth daily as needed for mild pain or moderate pain.     Cholecalciferol (VITAMIN D3) 50 MCG (2000 UT) TABS Take 2,000 Units by mouth daily.     colchicine 0.6 MG tablet Take 1 tablet (0.6 mg total) by mouth 2 (two) times daily. (Patient taking differently: Take 0.6 mg by mouth as needed.) 60 tablet 3   famotidine (PEPCID) 40 MG tablet Take 40 mg by mouth 2 (two) times daily.     fluticasone (FLONASE) 50 MCG/ACT nasal spray Place 2 sprays into both nostrils daily. (Patient taking differently: Place 2 sprays into both nostrils daily as needed for allergies.) 16 g 6   Glucosamine HCl (GLUCOSAMINE PO) Take 1,000 mg by mouth daily.      hydrochlorothiazide (HYDRODIURIL) 25 MG tablet TAKE 1 TABLET (25 MG TOTAL) BY MOUTH DAILY. 90 tablet 3   Ibuprofen 200 MG CAPS Take 400 mg by mouth daily as needed (For pain).     KLOR-CON M20 20 MEQ tablet TAKE 1 TABLET BY MOUTH EVERY DAY 90 tablet 3   levothyroxine (SYNTHROID) 88 MCG tablet Take 1 tablet (88 mcg total) by mouth daily. 90 tablet 3   loratadine (CLARITIN) 10 MG tablet TAKE 1 TABLET BY MOUTH EVERY DAY (Patient taking differently: Take 10 mg by mouth daily.) 90 tablet 2   losartan (COZAAR) 100 MG tablet Take 1 tablet (100 mg total) by mouth daily. 90 tablet 3   Magnesium 250 MG TABS Take 250 mg by mouth daily. For leg cramps     Menthol, Topical Analgesic, (BIOFREEZE EX) Apply 1 application  topically daily as needed (On Knees and feet).     metoprolol succinate (TOPROL XL) 25 MG 24 hr tablet Take 0.5 tablets (12.5 mg total) by mouth daily. 45 tablet 3   Misc Natural Products (YUMVS BEET ROOT-TART CHERRY) 250-0.5 MG CHEW Chew 1 tablet by mouth 2 (two) times a week.     omeprazole (PRILOSEC) 40 MG capsule Take 1 capsule (40 mg total) by mouth daily. (Patient taking differently: Take 40 mg by mouth daily as needed (reflux).) 90 capsule 1   Turmeric 500 MG CAPS Take 500 mg  by mouth daily.     No current facility-administered medications for this visit.     Past Medical History:  Diagnosis Date   Allergic rhinitis, cause unspecified    Allergy 2016   Anemia, iron deficiency    Arthritis    COVID-19 37/62/8315   Diastolic dysfunction    Diverticulosis of colon    GERD (gastroesophageal reflux disease)    Goiter    Hiatal hernia    Hypertension    IBS (irritable bowel syndrome)    Morbid obesity ([place])    Obstructive sleep apnea    Sleep apnea    Thrombocytosis    Undiagnosed cardiac murmurs     ROS:   All systems reviewed and negative except as noted in the HPI.   Past Surgical History:  Procedure Laterality Date   ABDOMINAL HYSTERECTOMY  05/2013   BARIATRIC  SURGERY  06/17/2010   BILATERAL SALPINGECTOMY Bilateral 06/22/2014   Procedure: BILATERAL SALPINGECTOMY;  Surgeon: Crawford Givens, MD;  Location: [facility];  Service: Gynecology;  Laterality: Bilateral;   CYSTOSCOPY N/A 06/22/2014   Procedure: CYSTOSCOPY;  Surgeon: Crawford Givens, MD;  Location: [facility];  Service: Gynecology;  Laterality: N/A;   ENDOMETRIAL ABLATION  12/08   FOOT SURGERY Left 11/24/2017   Dr Paulla Dolly   LAPAROSCOPIC ASSISTED VAGINAL HYSTERECTOMY N/A 06/22/2014   Procedure: LAPAROSCOPIC ASSISTED VAGINAL HYSTERECTOMY;  Surgeon: Crawford Givens, MD;  Location: [facility];  Service: Gynecology;  Laterality: N/A;   LEAD REVISION/REPAIR N/A 12/17/2021   Procedure: LEAD REVISION/REPAIR;  Surgeon: Schader Lance, MD;  Location: [facility];  Service: Cardiovascular;  Laterality: N/A;   LIPOMA EXCISION     upper back   PACEMAKER IMPLANT N/A 12/09/2021   Procedure: PACEMAKER IMPLANT;  Surgeon: Keys Lance, MD;  Location: [facility];  Service: Cardiovascular;  Laterality: N/A;   SPINE SURGERY  02/10/2012   L4-l5 fusion--cohen     Family History  Problem Relation Age of Onset   Breast cancer Mother 92   Arrhythmia Mother    Heart disease Mother    Arthritis Mother    Diabetes Mother    Hyperlipidemia Mother    Hypertension Mother    Hypertension Father    Kidney cancer Father    Colon polyps Father    Lymphoma Father        Diffused Large B-cell Lymphoma   Breast cancer Maternal Aunt 63   Stroke Maternal Grandmother 85   Ovarian cancer Paternal Grandmother    Colon cancer Neg Hx      Social History   Socioeconomic History   Marital status: Married    Spouse name: Belenda Cruise   Number of children: 3   Years of education: Some college   Highest education level: Not on file  Occupational History   Occupation: Sales promotion account executive: UNEMPLOYED  Tobacco Use   Smoking status: Never    Passive exposure: Never   Smokeless tobacco: Never   Tobacco comments:    Never  Used Tobacco  Vaping Use   Vaping Use: Never used  Substance and Sexual Activity   Alcohol use: No    Alcohol/week: 0.0 standard drinks of alcohol   Drug use: No   Sexual activity: Yes    Partners: Male    Comment: Hysterectomy  Other Topics Concern   Not on file  Social History Narrative   09/21/19   From: [place], [place] originally   Living: with husband, Belenda Cruise and 2 daughters w/  family   Work: homemaker      Family: 3 children - [place], [place], and [employer]. Has 8 grandchildren and one on the way      Enjoys: spend time with family, shopping      Exercise: trying to walk more   Diet: eats late at night, will go most of the day w/o eating      Safety   Seat belts: Yes    Guns: Yes  and secure   Safe in relationships: Yes    Social Determinants of Health   Financial Resource Strain: Not on file  Food Insecurity: Not on file  Transportation Needs: Not on file  Physical Activity: Not on file  Stress: Not on file  Social Connections: Not on file  Intimate Partner Violence: Not on file     BP 126/78   Pulse 60   Ht '5\' 3"'$  (1.6 m)   Wt 236 lb 12.8 oz (107.4 kg)   LMP 01/20/2011   SpO2 97%   BMI 41.95 kg/m   Physical Exam:  Well appearing overweight woman, NAD HEENT: Unremarkable Neck:  No JVD, no thyromegally Lymphatics:  No adenopathy Back:  No CVA tenderness Lungs:  Clear HEART:  Regular rate rhythm, no murmurs, no rubs, no clicks Abd:  soft, positive bowel sounds, no organomegally, no rebound, no guarding Ext:  2 plus pulses, no edema, no cyanosis, no clubbing Skin:  No rashes no nodules Neuro:  CN II through XII intact, motor grossly intact   DEVICE  Normal device function.  See PaceArt for details.   Assess/Plan:  Intermittent heart block - she is asymptomatic s/p PPM. She is pacing less than 1% of the time in the ventricle. PAF/SVT - she has had episodes lasting up to 90 seconds. I have asked her to continue her toprol and increase to a whole  25 mg tablet daily.  PVC's - she has had 2200 in 3 months.  Obesity - she needs to lose weight. She is down 3 lbs since her last visit.   [name] Overlie Tametha Banning,MD

## 2022-05-27 NOTE — Patient Instructions (Signed)
Medication Instructions:  Your physician has recommended you make the following change in your medication:  1) INCREASE Toprol (metoprolol succinate) to 1 tablet (25 mg) daily *If you need a refill on your cardiac medications before your next appointment, please call your pharmacy*  Follow-Up: At [facility], you and your health needs are our priority.  As part of our continuing mission to provide you with exceptional heart care, we have created designated Provider Care Teams.  These Care Teams include your primary Cardiologist (physician) and Advanced Practice Providers (APPs -  Physician Assistants and Nurse Practitioners) who all work together to provide you with the care you need, when you need it.  Your next appointment:   1 year(s)  The format for your next appointment:   In Person  Provider:   You may see Cristopher Peru, MD or one of the following Advanced Practice Providers on your designated Care Team:   Tommye Standard, [place] Legrand Como "Jonni Sanger" Chalmers Cater, [place]   Important Information About Sugar

## 2022-06-10 ENCOUNTER — Encounter: Payer: Self-pay | Admitting: Cardiology

## 2022-06-11 ENCOUNTER — Encounter: Payer: Self-pay | Admitting: Cardiology

## 2022-06-11 ENCOUNTER — Ambulatory Visit: Payer: 59 | Attending: Internal Medicine

## 2022-06-11 DIAGNOSIS — I442 Atrioventricular block, complete: Secondary | ICD-10-CM | POA: Diagnosis not present

## 2022-06-12 LAB — CUP PACEART REMOTE DEVICE CHECK
Battery Remaining Longevity: 162 mo
Battery Remaining Percentage: 100 %
Brady Statistic RA Percent Paced: 42 %
Brady Statistic RV Percent Paced: 0 %
Date Time Interrogation Session: 20240117044200
Implantable Lead Connection Status: 753985
Implantable Lead Connection Status: 753985
Implantable Lead Implant Date: 20230717
Implantable Lead Implant Date: 20230717
Implantable Lead Location: 753859
Implantable Lead Location: 753860
Implantable Lead Model: 7841
Implantable Lead Model: 7842
Implantable Lead Serial Number: 1204466
Implantable Lead Serial Number: 1298900
Implantable Pulse Generator Implant Date: 20230717
Lead Channel Impedance Value: 744 Ohm
Lead Channel Impedance Value: 744 Ohm
Lead Channel Pacing Threshold Amplitude: 0.7 V
Lead Channel Pacing Threshold Pulse Width: 0.4 ms
Lead Channel Setting Pacing Amplitude: 2.5 V
Lead Channel Setting Pacing Amplitude: 3.3 V
Lead Channel Setting Pacing Pulse Width: 1 ms
Lead Channel Setting Sensing Sensitivity: 1 mV
Pulse Gen Serial Number: 603605
Zone Setting Status: 755011

## 2022-06-12 NOTE — Telephone Encounter (Addendum)
1/18  DENIED- REF# [id number] 1/16  SENT TO [facility]#[id number]- FAX #[phone].  Per Dr Radford Pax, If unable to perform an in lab titration then initiate ResMed auto CPAP from 4 to 15cm H2O with heated humidity and mask of choice and overnight pulse ox on CPAP.   Upon patient request DME selection is [facility]. Patient understands he will be contacted by [facility] to set up his cpap. Patient understands to call if [facility] does not contact him with new setup in a timely manner. Patient understands they will be called once confirmation has been received from [facility]/ that they have received their new machine to schedule 10 week follow up appointment.   [facility] notified of new cpap order  Please add to airview Patient was grateful for the call and thanked me.

## 2022-06-12 NOTE — Addendum Note (Signed)
Addended by: Freada Bergeron on: 06/12/2022 11:45 AM   Modules accepted: Orders

## 2022-06-17 ENCOUNTER — Inpatient Hospital Stay: Payer: 59 | Admitting: Hematology & Oncology

## 2022-06-17 ENCOUNTER — Other Ambulatory Visit: Payer: Self-pay

## 2022-06-17 ENCOUNTER — Encounter: Payer: Self-pay | Admitting: Hematology & Oncology

## 2022-06-17 ENCOUNTER — Inpatient Hospital Stay: Payer: 59 | Attending: Family

## 2022-06-17 VITALS — BP 135/68 | HR 59 | Temp 97.5°F | Resp 16 | Ht 63.0 in | Wt 238.0 lb

## 2022-06-17 DIAGNOSIS — D509 Iron deficiency anemia, unspecified: Secondary | ICD-10-CM | POA: Insufficient documentation

## 2022-06-17 DIAGNOSIS — K909 Intestinal malabsorption, unspecified: Secondary | ICD-10-CM | POA: Insufficient documentation

## 2022-06-17 DIAGNOSIS — Z950 Presence of cardiac pacemaker: Secondary | ICD-10-CM | POA: Diagnosis not present

## 2022-06-17 DIAGNOSIS — D75839 Thrombocytosis, unspecified: Secondary | ICD-10-CM

## 2022-06-17 DIAGNOSIS — D508 Other iron deficiency anemias: Secondary | ICD-10-CM

## 2022-06-17 LAB — IRON AND IRON BINDING CAPACITY (CC-WL,HP ONLY)
Iron: 91 ug/dL (ref 28–170)
Saturation Ratios: 26 % (ref 10.4–31.8)
TIBC: 353 ug/dL (ref 250–450)
UIBC: 262 ug/dL (ref 148–442)

## 2022-06-17 LAB — CMP (CANCER CENTER ONLY)
ALT: 18 U/L (ref 0–44)
AST: 23 U/L (ref 15–41)
Albumin: 4.6 g/dL (ref 3.5–5.0)
Alkaline Phosphatase: 130 U/L — ABNORMAL HIGH (ref 38–126)
Anion gap: 10 (ref 5–15)
BUN: 13 mg/dL (ref 6–20)
CO2: 29 mmol/L (ref 22–32)
Calcium: 9.8 mg/dL (ref 8.9–10.3)
Chloride: 101 mmol/L (ref 98–111)
Creatinine: 0.72 mg/dL (ref 0.44–1.00)
GFR, Estimated: >60 mL/min (ref >60)
Glucose, Bld: 103 mg/dL — ABNORMAL HIGH (ref 70–99)
Potassium: 3.7 mmol/L (ref 3.5–5.1)
Sodium: 140 mmol/L (ref 135–145)
Total Bilirubin: 0.3 mg/dL (ref 0.3–1.2)
Total Protein: 7.5 g/dL (ref 6.5–8.1)

## 2022-06-17 LAB — CBC WITH DIFFERENTIAL (CANCER CENTER ONLY)
Abs Immature Granulocytes: 0.04 10*3/uL (ref 0.00–0.07)
Basophils Absolute: 0.1 10*3/uL (ref 0.0–0.1)
Basophils Relative: 1 %
Eosinophils Absolute: 0.1 10*3/uL (ref 0.0–0.5)
Eosinophils Relative: 2 %
HCT: 41.8 % (ref 36.0–46.0)
Hemoglobin: 13.4 g/dL (ref 12.0–15.0)
Immature Granulocytes: 1 %
Lymphocytes Relative: 48 %
Lymphs Abs: 4.1 10*3/uL — ABNORMAL HIGH (ref 0.7–4.0)
MCH: 29 pg (ref 26.0–34.0)
MCHC: 32.1 g/dL (ref 30.0–36.0)
MCV: 90.5 fL (ref 80.0–100.0)
Monocytes Absolute: 0.5 10*3/uL (ref 0.1–1.0)
Monocytes Relative: 6 %
Neutro Abs: 3.5 10*3/uL (ref 1.7–7.7)
Neutrophils Relative %: 42 %
Platelet Count: 408 10*3/uL — ABNORMAL HIGH (ref 150–400)
RBC: 4.62 MIL/uL (ref 3.87–5.11)
RDW: 14.3 % (ref 11.5–15.5)
WBC Count: 8.3 10*3/uL (ref 4.0–10.5)
nRBC: 0 % (ref 0.0–0.2)

## 2022-06-17 LAB — RETICULOCYTES
Immature Retic Fract: 10.8 % (ref 2.3–15.9)
RBC.: 4.61 MIL/uL (ref 3.87–5.11)
Retic Count, Absolute: 85.3 10*3/uL (ref 19.0–186.0)
Retic Ct Pct: 1.9 % (ref 0.4–3.1)

## 2022-06-17 LAB — FERRITIN: Ferritin: 27 ng/mL (ref 11–307)

## 2022-06-17 LAB — LACTATE DEHYDROGENASE: LDH: 209 U/L — ABNORMAL HIGH (ref 98–192)

## 2022-06-17 NOTE — Progress Notes (Signed)
Hematology and Oncology Follow Up Visit  [name] [id number] 05-23-1966 57 y.o. 06/17/2022   Principle Diagnosis:  Thrombocytosis  - JAK2 (-)/ DNMT3A (+) Family history of lymphoma (father) Iron deficiency anemia secondary to malabsorption with bariatric surgery (2012)    Current Therapy:        Observation IV iron as indicated -- Feraheme given on 04/27/2018   Interim History:  [name] is here today for follow-up.  We saw [name] 6 months ago.  [name] been doing pretty well.  The big news is that [name] has a pacemaker in.  [name] had this placed in July. Like [name] had heart block.  Surprising, [name] is not on any anticoagulation.    [name] apparently has some kind of mixed connective tissue disease.  [name] does see rheumatology for this.  Of note, we did do a JAK2 assay on [name].  This was negative.  However, there was found to be a DNMT3A mutation.  This will have to be followed.  [name] is still taking care of [name] children.  [name] has 10 grandchildren that [name] helps take care of.  [name] has had no problems with nausea or vomiting.  [name] has had no problems with bleeding.  There is no bruising.  [name] has had no cough or shortness of breath.  There is been no change in bowel or bladder habits.  Thankfully, [name] has avoided COVID and influenza.  Overall, I would have to say that [name] performance status is probably ECOG 0.    Medications:  Allergies as of 06/17/2022       Reactions   Penicillin G Other (See Comments)   Penicillins Other (See Comments)   Causes severe, difficult to treat yeast infections        Medication List        Accurate as of June 17, 2022 10:44 AM. If you have any questions, ask your nurse or doctor.          acetaminophen 325 MG tablet Commonly known as: TYLENOL Take 1-2 tablets (325-650 mg total) by mouth every 4 (four) hours as needed for mild pain.   BIOFREEZE EX Apply 1 application  topically daily as needed (On Knees and feet).   Biotin 1000 MCG  tablet Take 1,000 mcg by mouth every other day.   celecoxib 200 MG capsule Commonly known as: CELEBREX Take 200 mg by mouth daily as needed for mild pain or moderate pain.   colchicine 0.6 MG tablet Take 1 tablet (0.6 mg total) by mouth 2 (two) times daily. What changed:  when to take this reasons to take this   famotidine 40 MG tablet Commonly known as: PEPCID Take 40 mg by mouth 2 (two) times daily.   fluticasone 50 MCG/ACT nasal spray Commonly known as: FLONASE Place 2 sprays into both nostrils daily. What changed:  when to take this reasons to take this   GLUCOSAMINE PO Take 1,000 mg by mouth daily.   hydrochlorothiazide 25 MG tablet Commonly known as: HYDRODIURIL TAKE 1 TABLET (25 MG TOTAL) BY MOUTH DAILY.   Ibuprofen 200 MG Caps Take 400 mg by mouth daily as needed (For pain).   Klor-Con M20 20 MEQ tablet Generic drug: potassium chloride SA TAKE 1 TABLET BY MOUTH EVERY DAY   levothyroxine 88 MCG tablet Commonly known as: SYNTHROID Take 1 tablet (88 mcg total) by mouth daily.   loratadine 10 MG tablet Commonly known as: CLARITIN TAKE 1 TABLET BY MOUTH EVERY DAY   losartan  100 MG tablet Commonly known as: COZAAR Take 1 tablet (100 mg total) by mouth daily.   Magnesium 250 MG Tabs Take 250 mg by mouth daily. For leg cramps   metoprolol succinate 25 MG 24 hr tablet Commonly known as: Toprol XL Take 1 tablet (25 mg total) by mouth daily.   omeprazole 40 MG capsule Commonly known as: PRILOSEC Take 1 capsule (40 mg total) by mouth daily. What changed:  when to take this reasons to take this   Turmeric 500 MG Caps Take 500 mg by mouth daily.   Vitamin D3 50 MCG (2000 UT) Tabs Take 2,000 Units by mouth daily.   YumVs Beet Root-Tart Cherry 250-0.5 MG Chew Chew 1 tablet by mouth 2 (two) times a week.        Allergies:  Allergies  Allergen Reactions   Penicillin G Other (See Comments)   Penicillins Other (See Comments)    Causes severe,  difficult to treat yeast infections    Past Medical History, Surgical history, Social history, and Family History were reviewed and updated.  Review of Systems: Review of Systems  Constitutional: Negative.   HENT: Negative.    Eyes: Negative.   Respiratory: Negative.    Cardiovascular: Negative.   Gastrointestinal: Negative.   Genitourinary: Negative.   Musculoskeletal: Negative.   Skin: Negative.   Neurological: Negative.   Endo/Heme/Allergies: Negative.   Psychiatric/Behavioral: Negative.       Physical Exam:  height is '5\' 3"'$  (1.6 m) and weight is 238 lb (108 kg). [name] oral temperature is 97.5 F (36.4 C) (abnormal). [name] blood pressure is 135/68 and [name] pulse is 59 (abnormal). [name] respiration is 16 and oxygen saturation is 98%.   Wt Readings from Last 3 Encounters:  06/17/22 238 lb (108 kg)  05/27/22 236 lb 12.8 oz (107.4 kg)  03/03/22 240 lb 6.4 oz (109 kg)    Physical Exam Vitals reviewed.  HENT:     Head: Normocephalic and atraumatic.  Eyes:     Pupils: Pupils are equal, round, and reactive to light.  Cardiovascular:     Rate and Rhythm: Normal rate and regular rhythm.     Heart sounds: Normal heart sounds.  Pulmonary:     Effort: Pulmonary effort is normal.     Breath sounds: Normal breath sounds.  Abdominal:     General: Bowel sounds are normal.     Palpations: Abdomen is soft.  Musculoskeletal:        General: No tenderness or deformity. Normal range of motion.     Cervical back: Normal range of motion.  Lymphadenopathy:     Cervical: No cervical adenopathy.  Skin:    General: Skin is warm and dry.     Findings: No erythema or rash.  Neurological:     Mental Status: [name] is alert and oriented to person, place, and time.  Psychiatric:        Behavior: Behavior normal.        Thought Content: Thought content normal.        Judgment: Judgment normal.     Lab Results  Component Value Date   WBC 8.3 06/17/2022   HGB 13.4 06/17/2022   HCT 41.8  06/17/2022   MCV 90.5 06/17/2022   PLT 408 (H) 06/17/2022   Lab Results  Component Value Date   FERRITIN 42 12/16/2021   IRON 100 12/16/2021   TIBC 367 12/16/2021   UIBC 267 12/16/2021   IRONPCTSAT 27 12/16/2021   Lab Results  Component Value Date   RETICCTPCT 1.9 06/17/2022   RBC 4.61 06/17/2022   RETICCTABS 79.2 04/19/2014   No results found for: "KPAFRELGTCHN", "LAMBDASER", "KAPLAMBRATIO" No results found for: "IGGSERUM", "IGA", "IGMSERUM" No results found for: "TOTALPROTELP", "ALBUMINELP", "A1GS", "A2GS", "BETS", "BETA2SER", "GAMS", "MSPIKE", "SPEI"   Chemistry      Component Value Date/Time   NA 140 06/17/2022 0952   NA 135 11/21/2021 1135   NA 138 04/27/2017 0933   K 3.7 06/17/2022 0952   K 3.3 (L) 04/27/2017 0933   CL 101 06/17/2022 0952   CO2 29 06/17/2022 0952   CO2 26 04/27/2017 0933   BUN 13 06/17/2022 0952   BUN 13 11/21/2021 1135   BUN 12.2 04/27/2017 0933   CREATININE 0.72 06/17/2022 0952   CREATININE 0.7 04/27/2017 0933   GLU 81 08/29/2019 0000      Component Value Date/Time   CALCIUM 9.8 06/17/2022 0952   CALCIUM 9.1 04/27/2017 0933   ALKPHOS 130 (H) 06/17/2022 0952   ALKPHOS 139 04/27/2017 0933   AST 23 06/17/2022 0952   AST 25 04/27/2017 0933   ALT 18 06/17/2022 0952   ALT 19 04/27/2017 0933   BILITOT 0.3 06/17/2022 0952   BILITOT 0.58 04/27/2017 0933       Impression and Plan: [name] is a very pleasant 57 yo African American female with history of mild thrombocytosis as well as intermittent iron deficiency anemia secondary to malabsorption.  [name] blood counts look okay.  I looked at [name] blood smear under the microscope.  I do not see anything that looked suspicious.  [name] platelet count is minimally elevated but stable.  Again we will have to monitor [name] blood counts.  It is hard to say if this DNMT3A mutation is going to be a problem for [place].  [name] and [name] husband are going to [place] to see some friends.  They are driving down.  I told  [name] to take some baby aspirin for the trip.  I told [name] to take 81 mg of aspirin starting the day before [name] goes down.  [name] will continue this while [name] is down in [place] and then finish up the day after [name] returns.  I told [name] to make sure that [name] is well-hydrated and gets out and walks around every couple hours.  I told [name] to take the aspirin with food.  I would like to see [name] back in 6 months.  Volanda Napoleon, MD 1/23/202410:44 AM

## 2022-06-30 DIAGNOSIS — G4733 Obstructive sleep apnea (adult) (pediatric): Secondary | ICD-10-CM | POA: Diagnosis not present

## 2022-07-03 NOTE — Progress Notes (Signed)
Remote pacemaker transmission.   

## 2022-07-16 DIAGNOSIS — G4733 Obstructive sleep apnea (adult) (pediatric): Secondary | ICD-10-CM | POA: Diagnosis not present

## 2022-07-22 ENCOUNTER — Encounter: Payer: Self-pay | Admitting: Internal Medicine

## 2022-07-22 DIAGNOSIS — I10 Essential (primary) hypertension: Secondary | ICD-10-CM

## 2022-07-22 MED ORDER — HYDROCHLOROTHIAZIDE 25 MG PO TABS: 25.0000 mg | ORAL_TABLET | Freq: Every day | ORAL | 1 refills | Status: DC

## 2022-07-29 DIAGNOSIS — G4733 Obstructive sleep apnea (adult) (pediatric): Secondary | ICD-10-CM | POA: Diagnosis not present

## 2022-08-11 ENCOUNTER — Encounter: Payer: 59 | Admitting: Family Medicine

## 2022-08-14 DIAGNOSIS — G4733 Obstructive sleep apnea (adult) (pediatric): Secondary | ICD-10-CM | POA: Diagnosis not present

## 2022-08-28 ENCOUNTER — Ambulatory Visit: Payer: 59 | Attending: Cardiology | Admitting: Cardiology

## 2022-08-28 VITALS — BP 152/80 | HR 60 | Ht 63.0 in | Wt 238.0 lb

## 2022-08-28 DIAGNOSIS — G4733 Obstructive sleep apnea (adult) (pediatric): Secondary | ICD-10-CM

## 2022-08-28 DIAGNOSIS — I10 Essential (primary) hypertension: Secondary | ICD-10-CM

## 2022-08-28 NOTE — Progress Notes (Addendum)
SLEEP MEDICINE VIRTUAL New Patient NOTE via Video Note   Because of [name]'s co-morbid illnesses, she is at least at moderate risk for complications without adequate follow up.  This format is felt to be most appropriate for this patient at this time.  All issues noted in this document were discussed and addressed.  A limited physical exam was performed with this format.  Please refer to the patient's chart for her consent to telehealth for [facility].    Date:  08/28/2022   ID:  [name], DOB 17-May-1966, MRN [id number] The patient was identified using 2 identifiers.  Patient Location: Home Provider Location: Home Office   PCP:  Philip Aspen, Limmie Patricia, MD   [facility] Providers Cardiologist:  Thomasene Ripple, DO     Evaluation Performed:  Consultation - [name] was referred by Thomasene Ripple, MD  for the evaluation of OSA.  Chief Complaint:  OSA  History of Present Illness:    [name] is a 57 y.o. female who is being seen today for the evaluation of OSA at the request of Ahmed Prima, MD.  [name] is a 57 y.o. female with a history of mixed connective tissue disorder, intermittent AV block status post PPM, PAF and OSA.  Apparently she was on CPAP at some time years ago and then had weight loss surgery and did not need the CPAP after that in 2012 and stopped using it.  Dr. Servando Salina reordered a home sleep study to see if she had any residual sleep apnea.   She was having problems with feeling sleepy and tired during the day and her husband said that she was snoring very badly.    She underwent HST.  This revealed mild obstructive sleep apnea with an AHI of 13/h.  She was started on auto CPAP from 4 to 15 cm H2O.  She is now referred for sleep evaluation for ongoing treatment of obstructive sleep apnea.  She is doing well with her PAP device and thinks that she has gotten used to it.  She tolerates the nasal mask  and feels the pressure is adequate.  Since going on PAP she feels rested in the am and has no significant daytime sleepiness.  She sleeps better at night as well with deeper sleep. She denies any significant mouth or nasal dryness or nasal congestion.  She does not think that he snores.     Past Medical History:  Diagnosis Date   Allergic rhinitis, cause unspecified    Allergy 2016   Anemia, iron deficiency    Arthritis    COVID-19 06/15/2020   Diastolic dysfunction    Diverticulosis of colon    GERD (gastroesophageal reflux disease)    Goiter    Hiatal hernia    Hypertension    IBS (irritable bowel syndrome)    Morbid obesity (HCC)    Obstructive sleep apnea    Sleep apnea    Thrombocytosis    Undiagnosed cardiac murmurs    Past Surgical History:  Procedure Laterality Date   ABDOMINAL HYSTERECTOMY  05/2013   BARIATRIC SURGERY  06/17/2010   BILATERAL SALPINGECTOMY Bilateral 06/22/2014   Procedure: BILATERAL SALPINGECTOMY;  Surgeon: Jaymes Graff, MD;  Location: [facility];  Service: Gynecology;  Laterality: Bilateral;   CYSTOSCOPY N/A 06/22/2014   Procedure: CYSTOSCOPY;  Surgeon: Jaymes Graff, MD;  Location: [facility];  Service: Gynecology;  Laterality: N/A;   ENDOMETRIAL ABLATION  12/08  FOOT SURGERY Left 11/24/2017   Dr Charlsie Merles   LAPAROSCOPIC ASSISTED VAGINAL HYSTERECTOMY N/A 06/22/2014   Procedure: LAPAROSCOPIC ASSISTED VAGINAL HYSTERECTOMY;  Surgeon: Jaymes Graff, MD;  Location: [facility];  Service: Gynecology;  Laterality: N/A;   LEAD REVISION/REPAIR N/A 12/17/2021   Procedure: LEAD REVISION/REPAIR;  Surgeon: Marinus Maw, MD;  Location: [facility];  Service: Cardiovascular;  Laterality: N/A;   LIPOMA EXCISION     upper back   PACEMAKER IMPLANT N/A 12/09/2021   Procedure: PACEMAKER IMPLANT;  Surgeon: Marinus Maw, MD;  Location: [facility];  Service: Cardiovascular;  Laterality: N/A;   SPINE SURGERY  02/10/2012   L4-l5 fusion--cohen     Current Meds   Medication Sig   acetaminophen (TYLENOL) 325 MG tablet Take 1-2 tablets (325-650 mg total) by mouth every 4 (four) hours as needed for mild pain.   Biotin 1000 MCG tablet Take 1,000 mcg by mouth every other day.   celecoxib (CELEBREX) 200 MG capsule Take 200 mg by mouth daily as needed for mild pain or moderate pain.   Cholecalciferol (VITAMIN D3) 50 MCG (2000 UT) TABS Take 2,000 Units by mouth daily.   famotidine (PEPCID) 40 MG tablet Take 40 mg by mouth 2 (two) times daily.   fluticasone (FLONASE) 50 MCG/ACT nasal spray Place 2 sprays into both nostrils daily. (Patient taking differently: Place 2 sprays into both nostrils daily as needed for allergies.)   Glucosamine HCl (GLUCOSAMINE PO) Take 1,000 mg by mouth daily.   hydrochlorothiazide (HYDRODIURIL) 25 MG tablet Take 1 tablet (25 mg total) by mouth daily.   Ibuprofen 200 MG CAPS Take 400 mg by mouth daily as needed (For pain).   KLOR-CON M20 20 MEQ tablet TAKE 1 TABLET BY MOUTH EVERY DAY   levothyroxine (SYNTHROID) 88 MCG tablet Take 1 tablet (88 mcg total) by mouth daily.   loratadine (CLARITIN) 10 MG tablet TAKE 1 TABLET BY MOUTH EVERY DAY (Patient taking differently: Take 10 mg by mouth daily.)   losartan (COZAAR) 100 MG tablet Take 1 tablet (100 mg total) by mouth daily.   Magnesium 250 MG TABS Take 250 mg by mouth daily. For leg cramps   Menthol, Topical Analgesic, (BIOFREEZE EX) Apply 1 application  topically daily as needed (On Knees and feet).   metoprolol succinate (TOPROL XL) 25 MG 24 hr tablet Take 1 tablet (25 mg total) by mouth daily.   metoprolol succinate (TOPROL-XL) 25 MG 24 hr tablet Take 12.5 mg by mouth daily. Pt takes 1/2 tablet daily.   Misc Natural Products (YUMVS BEET ROOT-TART CHERRY) 250-0.5 MG CHEW Chew 1 tablet by mouth 2 (two) times a week.   omeprazole (PRILOSEC) 40 MG capsule Take 1 capsule (40 mg total) by mouth daily. (Patient taking differently: Take 40 mg by mouth daily as needed (reflux).)   Turmeric 500  MG CAPS Take 500 mg by mouth daily.     Allergies:   Penicillin g and Penicillins   Social History   Tobacco Use   Smoking status: Never    Passive exposure: Never   Smokeless tobacco: Never   Tobacco comments:    Never Used Tobacco  Vaping Use   Vaping Use: Never used  Substance Use Topics   Alcohol use: No    Alcohol/week: 0.0 standard drinks of alcohol   Drug use: No     Family Hx: The patient's family history includes Arrhythmia in her mother; Arthritis in her mother; Breast cancer (age of onset: 74) in  her maternal aunt; Breast cancer (age of onset: 63) in her mother; Colon polyps in her father; Diabetes in her mother; Heart disease in her mother; Hyperlipidemia in her mother; Hypertension in her father and mother; Kidney cancer in her father; Lymphoma in her father; Ovarian cancer in her paternal grandmother; Stroke (age of onset: 62) in her maternal grandmother. There is no history of Colon cancer.  ROS:   Please see the history of present illness.     All other systems reviewed and are negative.   Prior Sleep studies:   The following studies were reviewed today:  Home sleep study and PAP compliance download  Labs/Other Tests and Data Reviewed:     Recent Labs: 06/17/2022: ALT 18; BUN 13; Creatinine 0.72; Hemoglobin 13.4; Platelet Count 408; Potassium 3.7; Sodium 140    Wt Readings from Last 3 Encounters:  08/28/22 238 lb (108 kg)  06/17/22 238 lb (108 kg)  05/27/22 236 lb 12.8 oz (107.4 kg)        Objective:    Vital Signs:  BP (!) 152/80   Pulse 60   Ht 5\' 3"  (1.6 m)   Wt 238 lb (108 kg)   LMP 01/20/2011   SpO2 97%   BMI 42.16 kg/m    VITAL SIGNS:  reviewed GEN:  no acute distress EYES:  sclerae anicteric, EOMI - Extraocular Movements Intact RESPIRATORY:  normal respiratory effort, symmetric expansion CARDIOVASCULAR:  no peripheral edema SKIN:  no rash, lesions or ulcers. MUSCULOSKELETAL:  no obvious deformities. NEURO:  alert and oriented x  3, no obvious focal deficit PSYCH:  normal affect  ASSESSMENT & PLAN:    OSA - The patient is tolerating PAP therapy well without any problems. The PAP download performed by his DME was personally reviewed and interpreted by me today and showed an AHI of 0.1 /hr on auto CPAP from 4-15 cm H2O with 100% compliance in using more than 4 hours nightly.  The patient has been using and benefiting from PAP use and will continue to benefit from therapy.   Hypertension -BP controlled -Continue prescription drug management with hydrochlorothiazide 25 mg daily, losartan 100 mg daily, Toprol-XL 37.5 mg daily with as needed refills  Time:   Today, I have spent 45 minutes with the patient with telehealth technology discussing the above problems including time spent reviewing OV notes from referring MD, reviewing home sleep study and discussing results, reviewing PAP compliance download and time spent with education on sleep apnea treatment and CPAP.   Medication Adjustments/Labs and Tests Ordered: Current medicines are reviewed at length with the patient today.  Concerns regarding medicines are outlined above.   Tests Ordered: No orders of the defined types were placed in this encounter.   Medication Changes: No orders of the defined types were placed in this encounter.   Follow Up:  In Person in 1 year(s)  Signed, Armanda Magic, MD  08/28/2022 8:49 AM    [facility]

## 2022-08-28 NOTE — Progress Notes (Signed)
Office Visit Note  Patient: [name]             Date of Birth: 1965/09/23           MRN: [id number]             PCP: Philip Aspen, Limmie Patricia, MD Referring: Thomasene Ripple, DO Visit Date: 09/11/2022 Occupation: @GUAROCC @  Subjective:  Positive RNP and fatigue  History of Present Illness: [name] is a 57 y.o. female seen in consultation per request of Dr. Servando Salina.  Patient gives longstanding history of connective tissue disease.  She is a previous patient of Dr. Lendon Colonel and then was evaluated by Mr. Fredderick Phenix in [facility].  She presented in October 2011 with joint pain and muscle pain.  She was given the diagnosis of osteoarthritis of the knee joints.  She also had history of degenerative disease of lumbar spine requiring fusion.  Patient was initially evaluated by Dr. Lendon Colonel in 2017 at the time her labs showed positive ANA and positive RNP.  She also asked was experiencing fatigue.  She was given the diagnosis of undifferentiated connective tissue disease.  Patient states she has been having rash on her back and her extremities for the last few years.  She seen a dermatologist in the past and was diagnosed with eczema.  No true diagnosis was established.  Dr. Lendon Colonel is taking care of her over the years and did not recommend any treatment as bedside serology she did not have many symptoms.  Patient states in 2022 she developed bradycardia to the point she required pacemaker placement in 2023.  She was diagnosed with intermittent AV block.  She been on Toprol since then.  She states for the last 3 months she has been having falls and had fallen 3 times.  She is not sure if she had syncopal episode prior to these falls.  She has been also followed by hematology for several years for anemia due to malabsorption, thrombocytosis and elevated alkaline phosphatase.  She has been under care of Dr. Eulah Pont for osteoarthritis.  She has been diagnosed with osteoarthritis of the left hip.  Patient  states she recently had a cortisone injection for the left trochanteric bursitis.  She was also diagnosed with osteoarthritis of the knee joints in the past at [facility] office.  She was advised surgery but decided against it.  She states she has intermittent discomfort in her right ankle.  She gives history of dry mouth, dry eyes, shortness of breath, palpitations, hair loss.  There is no history of Raynauds, photosensitivity or lymphadenopathy.  There is no family history of autoimmune disease.  She is gravida 4, para 3, miscarriage 1.  Patient states that she had preeclampsia with her last pregnancy.  There is no history of DVTs.    Activities of Daily Living:  Patient reports morning stiffness for 10 minutes.   Patient Reports nocturnal pain.  Difficulty dressing/grooming: Denies Difficulty climbing stairs: Denies Difficulty getting out of chair: Denies Difficulty using hands for taps, buttons, cutlery, and/or writing: Reports  Review of Systems  Constitutional:  Positive for fatigue.  HENT:  Positive for mouth dryness. Negative for mouth sores.   Eyes:  Positive for dryness.  Respiratory:  Positive for shortness of breath.   Cardiovascular:  Positive for chest pain and palpitations.  Gastrointestinal:  Negative for blood in stool, constipation and diarrhea.  Endocrine: Negative for increased urination.  Genitourinary:  Negative for involuntary urination.  Musculoskeletal:  Positive  for joint pain, gait problem, joint pain, joint swelling, myalgias, muscle weakness, morning stiffness, muscle tenderness and myalgias.  Skin:  Positive for rash and hair loss. Negative for color change and sensitivity to sunlight.  Allergic/Immunologic: Negative for susceptible to infections.  Neurological:  Positive for numbness and headaches. Negative for dizziness.  Hematological:  Negative for swollen glands.  Psychiatric/Behavioral:  Negative for depressed mood and sleep disturbance. The patient  is not nervous/anxious.     PMFS History:  Patient Active Problem List   Diagnosis Date Noted   Atrial fibrillation 02/12/2022   Pacemaker complications 12/17/2021   Transient complete heart block 12/16/2021   Pacemaker 12/16/2021   SOB (shortness of breath) 09/17/2021   Murmur 09/17/2021   Chest pain of uncertain etiology 09/17/2021   Acute pain of right shoulder 01/01/2021   Sinus congestion 12/17/2020   Abnormal stools 01/24/2020   Dizziness 01/24/2020   Leg swelling 01/24/2020   Primary generalized (osteo)arthritis 09/21/2019   Elevated serum alkaline phosphatase level 09/21/2019   Rash 09/21/2019   Left hip pain 09/21/2019   Bradycardia 11/11/2018   Vitamin D deficiency 11/11/2018   Vitamin B12 deficiency 11/11/2018   Hypokalemia 11/11/2018   Thrombocytosis 11/11/2018   Bleeding 12/02/2017   Polymenorrhea 12/02/2017   Polyp of cervix 12/02/2017   Urinary incontinence 12/02/2017   Pain of right upper extremity 09/14/2017   Hypersomnia 06/05/2016   Abdominal pain, chronic, epigastric 04/13/2015   Irregular bleeding 06/22/2014   Obesity (BMI 30-39.9) 07/25/2013   Breast mass 07/25/2013   Changing mole 01/28/2013   Dysphagia, pharyngoesophageal phase 03/10/2011   Bariatric surgery status 03/10/2011   Esophageal ring 03/10/2011   SPINAL STENOSIS, LUMBAR 04/08/2010   HIATAL HERNIA 03/08/2010   Connective tissue disease 03/08/2010   DYSPNEA 12/13/2009   DIASTOLIC DYSFUNCTION 12/11/2009   ECHOCARDIOGRAM, ABNORMAL 12/11/2009   PALPITATIONS, OCCASIONAL 12/05/2009   CARDIAC MURMUR 12/05/2009   PAIN IN JOINT, MULTIPLE SITES 07/11/2009   MALAISE AND FATIGUE 07/05/2009   Obstructive sleep apnea 09/25/2008   GERD 01/04/2008   IBS 01/04/2008   Goiter 11/30/2007   MORBID OBESITY 11/22/2007   ANEMIA 08/23/2007   Allergic rhinitis 08/23/2007   DIVERTICULOSIS, COLON 08/23/2007   Other iron deficiency anemias 12/23/2006   Hypothyroidism 12/17/2006   Disease of blood and  blood forming organ 12/17/2006   Essential hypertension 12/17/2006   LOW BACK PAIN 12/17/2006    Past Medical History:  Diagnosis Date   Allergic rhinitis, cause unspecified    Allergy 2016   Anemia, iron deficiency    Arthritis    COVID-19 06/15/2020   Diastolic dysfunction    Diverticulosis of colon    GERD (gastroesophageal reflux disease)    Goiter    Hiatal hernia    Hypertension    IBS (irritable bowel syndrome)    Morbid obesity    Obstructive sleep apnea    Sleep apnea    Thrombocytosis    Undiagnosed cardiac murmurs     Family History  Problem Relation Age of Onset   Breast cancer Mother 56   Arrhythmia Mother    Heart disease Mother    Arthritis Mother    Diabetes Mother    Hyperlipidemia Mother    Hypertension Mother    Hypertension Father    Kidney cancer Father    Colon polyps Father    Lymphoma Father        Diffused Large B-cell Lymphoma   Cancer Father    Breast cancer Maternal Aunt 62  Stroke Maternal Grandmother 82   Ovarian cancer Paternal Grandmother    Healthy Daughter    Healthy Daughter    Healthy Daughter    Colon cancer Neg Hx    Past Surgical History:  Procedure Laterality Date   BARIATRIC SURGERY  06/17/2010   BILATERAL SALPINGECTOMY Bilateral 06/22/2014   Procedure: BILATERAL SALPINGECTOMY;  Surgeon: Jaymes Graff, MD;  Location: [facility];  Service: Gynecology;  Laterality: Bilateral;   CYSTOSCOPY N/A 06/22/2014   Procedure: CYSTOSCOPY;  Surgeon: Jaymes Graff, MD;  Location: [facility];  Service: Gynecology;  Laterality: N/A;   ENDOMETRIAL ABLATION  04/2007   FOOT SURGERY Left 11/24/2017   Dr Charlsie Merles   LAPAROSCOPIC ASSISTED VAGINAL HYSTERECTOMY N/A 06/22/2014   Procedure: LAPAROSCOPIC ASSISTED VAGINAL HYSTERECTOMY;  Surgeon: Jaymes Graff, MD;  Location: [facility];  Service: Gynecology;  Laterality: N/A;   LEAD REVISION/REPAIR N/A 12/17/2021   Procedure: LEAD REVISION/REPAIR;  Surgeon: Marinus Maw, MD;  Location: [facility];   Service: Cardiovascular;  Laterality: N/A;   LIPOMA EXCISION     upper back   PACEMAKER IMPLANT N/A 12/09/2021   Procedure: PACEMAKER IMPLANT;  Surgeon: Marinus Maw, MD;  Location: [facility];  Service: Cardiovascular;  Laterality: N/A;   SPINE SURGERY  02/10/2012   L4-l5 fusion--cohen   Social History   Social History Narrative   09/21/19   From: [facility], [place] originally   Living: with husband, Earl Lites and 2 daughters w/ family   Work: homemaker      Family: 3 children - [place], [place], and Hospital doctor. Has 8 grandchildren and one on the way      Enjoys: spend time with family, shopping      Exercise: trying to walk more   Diet: eats late at night, will go most of the day w/o eating      Safety   Seat belts: Yes    Guns: Yes  and secure   Safe in relationships: Yes    Immunization History  Administered Date(s) Administered   PFIZER(Purple Top)SARS-COV-2 Vaccination 10/06/2019, 10/31/2019   Td 02/07/2009   Tdap 01/24/2009, 08/07/2021   Zoster Recombinat (Shingrix) 09/09/2022     Objective: Vital Signs: BP 133/89 (BP Location: Right Arm, Patient Position: Sitting, Cuff Size: Large)   Pulse 63   Resp 16   Ht 5\' 3"  (1.6 m)   Wt 243 lb 9.6 oz (110.5 kg)   LMP 01/20/2011   BMI 43.15 kg/m    Physical Exam Vitals and nursing note reviewed.  Constitutional:      Appearance: She is well-developed.  HENT:     Head: Normocephalic and atraumatic.  Eyes:     Conjunctiva/sclera: Conjunctivae normal.  Cardiovascular:     Rate and Rhythm: Normal rate and regular rhythm.     Heart sounds: Normal heart sounds.  Pulmonary:     Effort: Pulmonary effort is normal.     Breath sounds: Normal breath sounds.  Abdominal:     General: Bowel sounds are normal.     Palpations: Abdomen is soft.  Musculoskeletal:     Cervical back: Normal range of motion.  Lymphadenopathy:     Cervical: No cervical adenopathy.  Skin:    General: Skin is warm and dry.     Capillary  Refill: Capillary refill takes less than 2 seconds.  Neurological:     Mental Status: She is alert and oriented to person, place, and time.  Psychiatric:        Behavior: Behavior  normal.      Musculoskeletal Exam: Cervical spine was in good range of motion.  She had limited range of motion of the lumbar spine.  Shoulder joints, elbow joints, wrist joints, MCPs PIPs and DIPs were in good range of motion with no synovitis.  She discomfort range of motion of her left hip joint.  Knee joints were in good range of motion without any warmth swelling or effusion.  Ankle joints were in good range of motion without any warmth swelling or effusion.  There was no tenderness over MTPs.  She complained of some discomfort in her right ankle.  CDAI Exam: CDAI Score: -- Patient Global: --; Provider Global: -- Swollen: --; Tender: -- Joint Exam 09/11/2022   No joint exam has been documented for this visit   There is currently no information documented on the homunculus. Go to the Rheumatology activity and complete the homunculus joint exam.  Investigation: No additional findings.  Imaging: No results found.  Recent Labs: Lab Results  Component Value Date   WBC 6.8 09/09/2022   HGB 13.0 09/09/2022   PLT 469.0 (H) 09/09/2022   NA 138 09/09/2022   K 3.9 09/09/2022   CL 100 09/09/2022   CO2 31 09/09/2022   GLUCOSE 88 09/09/2022   BUN 16 09/09/2022   CREATININE 0.63 09/09/2022   BILITOT 0.4 09/09/2022   ALKPHOS 131 (H) 09/09/2022   AST 21 09/09/2022   ALT 18 09/09/2022   PROT 7.4 09/09/2022   ALBUMIN 4.3 09/09/2022   CALCIUM 9.6 09/09/2022   GFRAA >60 01/27/2020   Labs from [facility] rheumatology urine protein creatinine ratio 244, TSH normal, CBC WBC 8.1, hemoglobin 13.5, platelets 500, C3-C4 normal, C-reactive protein normal, anti-DNA negative, SCL 70 negative, SSA negative, SSB negative, Smith negative, ANA negative, RNP 5.0, ESR 11, CK207 CMP normal, UA negative Speciality Comments:  No specialty comments available.  Procedures:  No procedures performed Allergies: Penicillin g and Penicillins   Assessment / Plan:     Visit Diagnoses: Undifferentiated connective tissue disease - Positive ANA positive RNP, fatigue -patient was given the diagnosis of undifferentiated connective tissue disease by Dr. Lendon Colonel many years ago.  She has been followed by Dr. Ivette Loyal since 2017.  I reviewed the records.  She had positive ANA with no titer given and positive RNP.  She gives history of sicca symptoms and arthralgias.  There is no history of oral ulcers, nasal ulcers, malar rash, photosensitivity, Raynaud's phenomenon or lymphadenopathy.  There is no evidence of inflammatory arthritis or synovitis on the examination today.  I will obtain labs today.  Use of sunscreen was advised.  Plan: Protein / creatinine ratio, urine, Rheumatoid factor, Cyclic citrul peptide antibody, IgG, ANA, Anti-scleroderma antibody, RNP Antibody, Anti-Smith antibody, Sjogrens syndrome-A extractable nuclear antibody, Sjogrens syndrome-B extractable nuclear antibody, Anti-DNA antibody, double-stranded, C3 and C4, Beta-2 glycoprotein antibodies, Cardiolipin antibodies, IgG, IgM, IgA  Other fatigue-she gives history of chronic fatigue.  Rash-patient gives history of rash.  Hyperpigmented macules were noted on her back and few on her lower extremities.  Patient was evaluated by dermatology in the past and was given the diagnosis of eczema.  I advised her to schedule an appointment with the dermatologist.  Use of sunscreen was advised.  Primary osteoarthritis of left hip-patient gives history of left hip pain.  I reviewed her previous x-rays which showed mild to moderate osteoarthritis which were done by Dr. Eulah Pont.  Primary osteoarthritis of both knees-patient states her surgeon has diagnosed her with end-stage osteoarthritis  of her knee joints.  And discussed total knee replacement in the past.  I do not have those x-rays to  confirm.  Polyarthralgia-she gives history of aches and pains in joints and muscles.  No synovitis was noted.  She also complains of discomfort off-and-on in her right ankle joint.  No swelling was noted today.  DDD (degenerative disc disease), lumbar -she was diagnosed with degenerative disc disease many years ago.  S/p fusion 2013 Dr. Noel Gerold.  Thrombocytosis - Followed by hematology.  She has been followed by Dr. Myna Hidalgo for many years.  Other iron deficiency anemias-I reviewed Dr. Gustavo Lah records and she was diagnosed with anemia due to malabsorption.  Vitamin B12 deficiency-she is on by MND supplement.  Vitamin D deficiency-she is on vitamin D supplement.  Her vitamin D is normal now.  Chronic pain syndrome-she has history of generalized pain and discomfort for many years.  Elevated serum alkaline phosphatase level -there is history of elevated alkaline phosphatase for many years.  Alkaline phosphatase was 131 on September 09, 2022.  Essential hypertension-she gives history of hypertension for many years.  Her blood pressure was 133/89.  Pacemaker-patient had a pacemaker placement in 2023 for intermittent AV block.  Other medical problems are listed as follows:  DIASTOLIC DYSFUNCTION  History of gastroesophageal reflux (GERD)  Hiatal hernia  Colon, diverticulosis  Acquired hypothyroidism  Obstructive sleep apnea  MORBID OBESITY  Orders: Orders Placed This Encounter  Procedures   Protein / creatinine ratio, urine   Rheumatoid factor   Cyclic citrul peptide antibody, IgG   ANA   Anti-scleroderma antibody   RNP Antibody   Anti-Smith antibody   Sjogrens syndrome-A extractable nuclear antibody   Sjogrens syndrome-B extractable nuclear antibody   Anti-DNA antibody, double-stranded   C3 and C4   Beta-2 glycoprotein antibodies   Cardiolipin antibodies, IgG, IgM, IgA   No orders of the defined types were placed in this encounter.  Face-to-face time spent patient was  60 minutes.  All the previous records and labs were reviewed.  Follow-Up Instructions: Return for UCTD.   Pollyann Savoy, MD  Note - This record has been created using Animal nutritionist.  Chart creation errors have been sought, but may not always  have been located. Such creation errors do not reflect on  the standard of medical care.

## 2022-08-28 NOTE — Patient Instructions (Signed)
Medication Instructions:  Your physician recommends that you continue on your current medications as directed. Please refer to the Current Medication list given to you today.  *If you need a refill on your cardiac medications before your next appointment, please call your pharmacy*   Lab Work: None.  If you have labs (blood work) drawn today and your tests are completely normal, you will receive your results only by: MyChart Message (if you have MyChart) OR A paper copy in the mail If you have any lab test that is abnormal or we need to change your treatment, we will call you to review the results.   Testing/Procedures: None.   Follow-Up:  Your next appointment:   1 year(s)  Provider:   Dr. Traci Turner, MD   

## 2022-08-29 ENCOUNTER — Ambulatory Visit: Payer: 59 | Attending: Cardiology | Admitting: Cardiology

## 2022-08-29 ENCOUNTER — Encounter: Payer: Self-pay | Admitting: Cardiology

## 2022-08-29 VITALS — BP 158/86 | HR 60 | Ht 63.0 in | Wt 243.2 lb

## 2022-08-29 DIAGNOSIS — G4733 Obstructive sleep apnea (adult) (pediatric): Secondary | ICD-10-CM

## 2022-08-29 DIAGNOSIS — I480 Paroxysmal atrial fibrillation: Secondary | ICD-10-CM

## 2022-08-29 DIAGNOSIS — R4701 Aphasia: Secondary | ICD-10-CM | POA: Diagnosis not present

## 2022-08-29 DIAGNOSIS — I10 Essential (primary) hypertension: Secondary | ICD-10-CM | POA: Diagnosis not present

## 2022-08-29 MED ORDER — VALSARTAN 160 MG PO TABS: 160.0000 mg | ORAL_TABLET | Freq: Every day | ORAL | 3 refills | Status: DC

## 2022-08-29 MED ORDER — METOPROLOL SUCCINATE ER 25 MG PO TB24: 12.5000 mg | ORAL_TABLET | Freq: Two times a day (BID) | ORAL | 3 refills | Status: DC

## 2022-08-29 NOTE — Progress Notes (Signed)
Cardiology Office Note:    Date:  08/29/2022   ID:  [name], DOB February 15, 1966, MRN [id number]  PCP:  Philip Aspen, Limmie Patricia, MD  Cardiologist:  Thomasene Ripple, DO  Electrophysiologist:  None   Referring MD: Philip Aspen, Estel*   " I am having palpitations"   History of Present Illness:    [name] is a 57 y.o. female with a hx of  mixed connective tissue disorder, intermittent AV block who underwent PPM insertion complicated by lead migration and asymptomatic perforation-she underwent insertion of a new lead, PAF which was recently found on her ppm interrogation, COPD, mild thrombocytosis, iron deficiency anemia, secondary to absorption after bariatric surgery in 2012, concern for mixed connective tissue disorder, sleep apnea no longer on CPAP, hypertension, irritable bowel syndrome and obesity.  Her last visit with me was 09/16/2021 - at that visit I placed a monitor on the patient which showed intermittent heart block- she is no status post pacemaker implantation.   Since her visit with me she has followed with EP and Dr. Mayford Knife for OSA. She tells me that she has been doing well but has had some intermittent abrupt falls. Most recent few days ago. She notes that there are no prodrome. She notes that sometime she fells her knee is also stiffening.  She is concern that she has recently had difficulty finding her words.   No other complaints at this time.   Past Medical History:  Diagnosis Date   Allergic rhinitis, cause unspecified    Allergy 2016   Anemia, iron deficiency    Arthritis    COVID-19 06/15/2020   Diastolic dysfunction    Diverticulosis of colon    GERD (gastroesophageal reflux disease)    Goiter    Hiatal hernia    Hypertension    IBS (irritable bowel syndrome)    Morbid obesity    Obstructive sleep apnea    Sleep apnea    Thrombocytosis    Undiagnosed cardiac murmurs     Past Surgical History:  Procedure Laterality Date    ABDOMINAL HYSTERECTOMY  05/2013   BARIATRIC SURGERY  06/17/2010   BILATERAL SALPINGECTOMY Bilateral 06/22/2014   Procedure: BILATERAL SALPINGECTOMY;  Surgeon: Jaymes Graff, MD;  Location: [facility];  Service: Gynecology;  Laterality: Bilateral;   CYSTOSCOPY N/A 06/22/2014   Procedure: CYSTOSCOPY;  Surgeon: Jaymes Graff, MD;  Location: [facility];  Service: Gynecology;  Laterality: N/A;   ENDOMETRIAL ABLATION  12/08   FOOT SURGERY Left 11/24/2017   Dr Charlsie Merles   LAPAROSCOPIC ASSISTED VAGINAL HYSTERECTOMY N/A 06/22/2014   Procedure: LAPAROSCOPIC ASSISTED VAGINAL HYSTERECTOMY;  Surgeon: Jaymes Graff, MD;  Location: [facility];  Service: Gynecology;  Laterality: N/A;   LEAD REVISION/REPAIR N/A 12/17/2021   Procedure: LEAD REVISION/REPAIR;  Surgeon: Marinus Maw, MD;  Location: [facility];  Service: Cardiovascular;  Laterality: N/A;   LIPOMA EXCISION     upper back   PACEMAKER IMPLANT N/A 12/09/2021   Procedure: PACEMAKER IMPLANT;  Surgeon: Marinus Maw, MD;  Location: [facility];  Service: Cardiovascular;  Laterality: N/A;   SPINE SURGERY  02/10/2012   L4-l5 fusion--cohen    Current Medications: Current Meds  Medication Sig   acetaminophen (TYLENOL) 325 MG tablet Take 1-2 tablets (325-650 mg total) by mouth every 4 (four) hours as needed for mild pain.   Biotin 1000 MCG tablet Take 1,000 mcg by mouth every other day.   celecoxib (CELEBREX) 200 MG capsule Take 200 mg by  mouth daily as needed for mild pain or moderate pain.   Cholecalciferol (VITAMIN D3) 50 MCG (2000 UT) TABS Take 2,000 Units by mouth daily.   famotidine (PEPCID) 40 MG tablet Take 40 mg by mouth 2 (two) times daily.   fluticasone (FLONASE) 50 MCG/ACT nasal spray Place 2 sprays into both nostrils daily. (Patient taking differently: Place 2 sprays into both nostrils daily as needed for allergies.)   Glucosamine HCl (GLUCOSAMINE PO) Take 1,000 mg by mouth daily.   hydrochlorothiazide (HYDRODIURIL) 25 MG tablet Take 1 tablet  (25 mg total) by mouth daily.   Ibuprofen 200 MG CAPS Take 400 mg by mouth daily as needed (For pain).   KLOR-CON M20 20 MEQ tablet TAKE 1 TABLET BY MOUTH EVERY DAY   levothyroxine (SYNTHROID) 88 MCG tablet Take 1 tablet (88 mcg total) by mouth daily.   loratadine (CLARITIN) 10 MG tablet TAKE 1 TABLET BY MOUTH EVERY DAY (Patient taking differently: Take 10 mg by mouth daily.)   Magnesium 250 MG TABS Take 250 mg by mouth daily. For leg cramps   Menthol, Topical Analgesic, (BIOFREEZE EX) Apply 1 application  topically daily as needed (On Knees and feet).   metoprolol succinate (TOPROL XL) 25 MG 24 hr tablet Take 0.5 tablets (12.5 mg total) by mouth 2 (two) times daily.   Misc Natural Products (YUMVS BEET ROOT-TART CHERRY) 250-0.5 MG CHEW Chew 1 tablet by mouth 2 (two) times a week.   Turmeric 500 MG CAPS Take 500 mg by mouth daily.   valsartan (DIOVAN) 160 MG tablet Take 1 tablet (160 mg total) by mouth daily.   [DISCONTINUED] losartan (COZAAR) 100 MG tablet Take 1 tablet (100 mg total) by mouth daily.   [DISCONTINUED] metoprolol succinate (TOPROL XL) 25 MG 24 hr tablet Take 1 tablet (25 mg total) by mouth daily.   [DISCONTINUED] metoprolol succinate (TOPROL-XL) 25 MG 24 hr tablet Take 12.5 mg by mouth daily. Pt takes 1/2 tablet daily.     Allergies:   Penicillin g and Penicillins   Social History   Socioeconomic History   Marital status: Married    Spouse name: Earl Lites   Number of children: 3   Years of education: Some college   Highest education level: Not on file  Occupational History   Occupation: Herbalist: UNEMPLOYED  Tobacco Use   Smoking status: Never    Passive exposure: Never   Smokeless tobacco: Never   Tobacco comments:    Never Used Tobacco  Vaping Use   Vaping Use: Never used  Substance and Sexual Activity   Alcohol use: No    Alcohol/week: 0.0 standard drinks of alcohol   Drug use: No   Sexual activity: Yes    Partners: Male    Comment:  Hysterectomy  Other Topics Concern   Not on file  Social History Narrative   09/21/19   From: [place], [place] originally   Living: with husband, Earl Lites and 2 daughters w/ family   Work: homemaker      Family: 3 children - [place], [place], and Hospital doctor. Has 8 grandchildren and one on the way      Enjoys: spend time with family, shopping      Exercise: trying to walk more   Diet: eats late at night, will go most of the day w/o eating      Safety   Seat belts: Yes    Guns: Yes  and secure   Safe in relationships: Yes  Social Determinants of Health   Financial Resource Strain: Not on file  Food Insecurity: Not on file  Transportation Needs: Not on file  Physical Activity: Not on file  Stress: Not on file  Social Connections: Not on file     Family History: The patient's family history includes Arrhythmia in her mother; Arthritis in her mother; Breast cancer (age of onset: 43) in her maternal aunt; Breast cancer (age of onset: 73) in her mother; Colon polyps in her father; Diabetes in her mother; Heart disease in her mother; Hyperlipidemia in her mother; Hypertension in her father and mother; Kidney cancer in her father; Lymphoma in her father; Ovarian cancer in her paternal grandmother; Stroke (age of onset: 37) in her maternal grandmother. There is no history of Colon cancer.  ROS:   Review of Systems  Constitution: Negative for decreased appetite, fever and weight gain.  HENT: Negative for congestion, ear discharge, hoarse voice and sore throat.   Eyes: Negative for discharge, redness, vision loss in right eye and visual halos.  Cardiovascular: Negative for chest pain, dyspnea on exertion, leg swelling, orthopnea and palpitations.  Respiratory: Negative for cough, hemoptysis, shortness of breath and snoring.   Endocrine: Negative for heat intolerance and polyphagia.  Hematologic/Lymphatic: Negative for bleeding problem. Does not bruise/bleed easily.  Skin: Negative for  flushing, nail changes, rash and suspicious lesions.  Musculoskeletal: Negative for arthritis, joint pain, muscle cramps, myalgias, neck pain and stiffness.  Gastrointestinal: Negative for abdominal pain, bowel incontinence, diarrhea and excessive appetite.  Genitourinary: Negative for decreased libido, genital sores and incomplete emptying.  Neurological: Negative for brief paralysis, focal weakness, headaches and loss of balance.  Psychiatric/Behavioral: Negative for altered mental status, depression and suicidal ideas.  Allergic/Immunologic: Negative for HIV exposure and persistent infections.    EKGs/Labs/Other Studies Reviewed:    The following studies were reviewed today:   EKG:  None today   Recent Labs: 06/17/2022: ALT 18; BUN 13; Creatinine 0.72; Hemoglobin 13.4; Platelet Count 408; Potassium 3.7; Sodium 140  Recent Lipid Panel    Component Value Date/Time   CHOL 191 08/07/2021 0900   TRIG 75.0 08/07/2021 0900   HDL 55.20 08/07/2021 0900   CHOLHDL 3 08/07/2021 0900   VLDL 15.0 08/07/2021 0900   LDLCALC 121 (H) 08/07/2021 0900    Physical Exam:    VS:  BP (!) 158/86   Pulse 60   Ht 5\' 3"  (1.6 m)   Wt 243 lb 3.2 oz (110.3 kg)   LMP 01/20/2011   SpO2 99%   BMI 43.08 kg/m     Wt Readings from Last 3 Encounters:  08/29/22 243 lb 3.2 oz (110.3 kg)  08/28/22 238 lb (108 kg)  06/17/22 238 lb (108 kg)     GEN: Well nourished, well developed in no acute distress HEENT: Normal NECK: No JVD; No carotid bruits LYMPHATICS: No lymphadenopathy CARDIAC: S1S2 noted,RRR, no murmurs, rubs, gallops RESPIRATORY:  Clear to auscultation without rales, wheezing or rhonchi  ABDOMEN: Soft, non-tender, non-distended, +bowel sounds, no guarding. EXTREMITIES: No edema, No cyanosis, no clubbing MUSCULOSKELETAL:  No deformity  SKIN: Warm and dry NEUROLOGIC:  Alert and oriented x 3, non-focal PSYCHIATRIC:  Normal affect, good insight  ASSESSMENT:    1. Difficulty finding words    2. PAF (paroxysmal atrial fibrillation)   3. OSA (obstructive sleep apnea)   4. Essential hypertension   5. Morbid obesity    PLAN:     PAF -  will have her split the Toprol xl  to 12.5 mg BID.  OSA - started CPAP which have help some.  3.  Bp elevated in the office today -will transition from losartan to valsartan 160 mg daily. 4.  The patient understands the need to lose weight with diet and exercise. We have discussed specific strategies for this. 5. With difficulty finding words she will benefit from neuro evaluation will refer. 6. Will ask for her device to be interrogated to get information on the day she feel to rule out arhythmia. 7. She has a schedule follow up with Rheumatology soon for her hx of connective tissue disease.  The patient is in agreement with the above plan. The patient left the office in stable condition.  The patient will follow up in   Medication Adjustments/Labs and Tests Ordered: Current medicines are reviewed at length with the patient today.  Concerns regarding medicines are outlined above.  Orders Placed This Encounter  Procedures   Ambulatory referral to Neurology   Meds ordered this encounter  Medications   metoprolol succinate (TOPROL XL) 25 MG 24 hr tablet    Sig: Take 0.5 tablets (12.5 mg total) by mouth 2 (two) times daily.    Dispense:  90 tablet    Refill:  3   valsartan (DIOVAN) 160 MG tablet    Sig: Take 1 tablet (160 mg total) by mouth daily.    Dispense:  90 tablet    Refill:  3    Patient Instructions  Medication Instructions:  Your physician has recommended you make the following change in your medication:  STOP: Losartan  INCREASE: Metoprolol 12.5 mg twice daily START: Valsartan 160 mg once daily *If you need a refill on your cardiac medications before your next appointment, please call your pharmacy*   Lab Work: None   Testing/Procedures: None   Follow-Up: At [facility], you and your health needs are  our priority.  As part of our continuing mission to provide you with exceptional heart care, we have created designated Provider Care Teams.  These Care Teams include your primary Cardiologist (physician) and Advanced Practice Providers (APPs -  Physician Assistants and Nurse Practitioners) who all work together to provide you with the care you need, when you need it.   Your next appointment:   6 month(s)  Provider:   Thomasene RippleKardie Willard Farquharson, DO      Adopting a Healthy Lifestyle.  Know what a healthy weight is for you (roughly BMI <25) and aim to maintain this   Aim for 7+ servings of fruits and vegetables daily   65-80+ fluid ounces of water or unsweet tea for healthy kidneys   Limit to max 1 drink of alcohol per day; avoid smoking/tobacco   Limit animal fats in diet for cholesterol and heart health - choose grass fed whenever available   Avoid highly processed foods, and foods high in saturated/trans fats   Aim for low stress - take time to unwind and care for your mental health   Aim for 150 min of moderate intensity exercise weekly for heart health, and weights twice weekly for bone health   Aim for 7-9 hours of sleep daily   When it comes to diets, agreement about the perfect plan isnt easy to find, even among the experts. Experts at the [employer] developed an idea known as the Healthy Eating Plate. Just imagine a plate divided into logical, healthy portions.   The emphasis is on diet quality:   Load up on vegetables and  fruits - one-half of your plate: Aim for color and variety, and remember that potatoes dont count.   Go for whole grains - one-quarter of your plate: Whole wheat, barley, wheat berries, quinoa, oats, brown rice, and foods made with them. If you want pasta, go with whole wheat pasta.   Protein power - one-quarter of your plate: Fish, chicken, beans, and nuts are all healthy, versatile protein sources. Limit red meat.   The diet, however, does  go beyond the plate, offering a few other suggestions.   Use healthy plant oils, such as olive, canola, soy, corn, sunflower and peanut. Check the labels, and avoid partially hydrogenated oil, which have unhealthy trans fats.   If youre thirsty, drink water. Coffee and tea are good in moderation, but skip sugary drinks and limit milk and dairy products to one or two daily servings.   The type of carbohydrate in the diet is more important than the amount. Some sources of carbohydrates, such as vegetables, fruits, whole grains, and beans-are healthier than others.   Finally, stay active  Signed, Thomasene RippleKardie Nehemie Casserly, DO  08/29/2022 9:53 PM    [facility]

## 2022-08-29 NOTE — Patient Instructions (Signed)
Medication Instructions:  Your physician has recommended you make the following change in your medication:  STOP: Losartan  INCREASE: Metoprolol 12.5 mg twice daily START: Valsartan 160 mg once daily *If you need a refill on your cardiac medications before your next appointment, please call your pharmacy*   Lab Work: None   Testing/Procedures: None   Follow-Up: At [facility], you and your health needs are our priority.  As part of our continuing mission to provide you with exceptional heart care, we have created designated Provider Care Teams.  These Care Teams include your primary Cardiologist (physician) and Advanced Practice Providers (APPs -  Physician Assistants and Nurse Practitioners) who all work together to provide you with the care you need, when you need it.   Your next appointment:   6 month(s)  Provider:   Thomasene Ripple, DO

## 2022-09-02 ENCOUNTER — Other Ambulatory Visit: Payer: Self-pay

## 2022-09-02 ENCOUNTER — Encounter: Payer: Self-pay | Admitting: Cardiology

## 2022-09-02 ENCOUNTER — Encounter: Payer: 59 | Admitting: Internal Medicine

## 2022-09-02 DIAGNOSIS — Z79899 Other long term (current) drug therapy: Secondary | ICD-10-CM

## 2022-09-02 NOTE — Progress Notes (Signed)
Orders for lab work placed

## 2022-09-03 ENCOUNTER — Other Ambulatory Visit: Payer: Self-pay

## 2022-09-03 ENCOUNTER — Encounter: Payer: Self-pay | Admitting: Family

## 2022-09-03 LAB — BASIC METABOLIC PANEL
BUN/Creatinine Ratio: 21 (ref 9–23)
BUN: 16 mg/dL (ref 6–24)
CO2: 24 mmol/L (ref 20–29)
Calcium: 9.3 mg/dL (ref 8.7–10.2)
Chloride: 105 mmol/L (ref 96–106)
Creatinine, Ser: 0.77 mg/dL (ref 0.57–1.00)
Glucose: 90 mg/dL (ref 70–99)
Potassium: 4.3 mmol/L (ref 3.5–5.2)
Sodium: 145 mmol/L — ABNORMAL HIGH (ref 134–144)
eGFR: 90 mL/min/{1.73_m2} (ref >59)

## 2022-09-03 LAB — MAGNESIUM: Magnesium: 2 mg/dL (ref 1.6–2.3)

## 2022-09-09 ENCOUNTER — Encounter: Payer: Self-pay | Admitting: Internal Medicine

## 2022-09-09 ENCOUNTER — Ambulatory Visit (INDEPENDENT_AMBULATORY_CARE_PROVIDER_SITE_OTHER): Payer: 59 | Admitting: Internal Medicine

## 2022-09-09 VITALS — BP 138/84 | HR 59 | Temp 97.6°F | Ht 63.0 in | Wt 242.9 lb

## 2022-09-09 DIAGNOSIS — E039 Hypothyroidism, unspecified: Secondary | ICD-10-CM

## 2022-09-09 DIAGNOSIS — E559 Vitamin D deficiency, unspecified: Secondary | ICD-10-CM | POA: Diagnosis not present

## 2022-09-09 DIAGNOSIS — E782 Mixed hyperlipidemia: Secondary | ICD-10-CM | POA: Diagnosis not present

## 2022-09-09 DIAGNOSIS — R7303 Prediabetes: Secondary | ICD-10-CM

## 2022-09-09 DIAGNOSIS — Z23 Encounter for immunization: Secondary | ICD-10-CM | POA: Diagnosis not present

## 2022-09-09 DIAGNOSIS — E538 Deficiency of other specified B group vitamins: Secondary | ICD-10-CM | POA: Diagnosis not present

## 2022-09-09 DIAGNOSIS — R296 Repeated falls: Secondary | ICD-10-CM | POA: Diagnosis not present

## 2022-09-09 DIAGNOSIS — Z0000 Encounter for general adult medical examination without abnormal findings: Secondary | ICD-10-CM | POA: Diagnosis not present

## 2022-09-09 DIAGNOSIS — I10 Essential (primary) hypertension: Secondary | ICD-10-CM

## 2022-09-09 LAB — LIPID PANEL
Cholesterol: 190 mg/dL (ref 0–200)
HDL: 60.1 mg/dL (ref >39.00)
LDL Cholesterol: 113 mg/dL — ABNORMAL HIGH (ref 0–99)
NonHDL: 130.13
Total CHOL/HDL Ratio: 3
Triglycerides: 84 mg/dL (ref 0.0–149.0)
VLDL: 16.8 mg/dL (ref 0.0–40.0)

## 2022-09-09 LAB — COMPREHENSIVE METABOLIC PANEL
ALT: 18 U/L (ref 0–35)
AST: 21 U/L (ref 0–37)
Albumin: 4.3 g/dL (ref 3.5–5.2)
Alkaline Phosphatase: 131 U/L — ABNORMAL HIGH (ref 39–117)
BUN: 16 mg/dL (ref 6–23)
CO2: 31 mEq/L (ref 19–32)
Calcium: 9.6 mg/dL (ref 8.4–10.5)
Chloride: 100 mEq/L (ref 96–112)
Creatinine, Ser: 0.63 mg/dL (ref 0.40–1.20)
GFR: 98.52 mL/min (ref >60.00)
Glucose, Bld: 88 mg/dL (ref 70–99)
Potassium: 3.9 mEq/L (ref 3.5–5.1)
Sodium: 138 mEq/L (ref 135–145)
Total Bilirubin: 0.4 mg/dL (ref 0.2–1.2)
Total Protein: 7.4 g/dL (ref 6.0–8.3)

## 2022-09-09 LAB — CBC WITH DIFFERENTIAL/PLATELET
Basophils Absolute: 0 10*3/uL (ref 0.0–0.1)
Basophils Relative: 0.4 % (ref 0.0–3.0)
Eosinophils Absolute: 0.1 10*3/uL (ref 0.0–0.7)
Eosinophils Relative: 1.1 % (ref 0.0–5.0)
HCT: 39.5 % (ref 36.0–46.0)
Hemoglobin: 13 g/dL (ref 12.0–15.0)
Lymphocytes Relative: 45.2 % (ref 12.0–46.0)
Lymphs Abs: 3.1 10*3/uL (ref 0.7–4.0)
MCHC: 33 g/dL (ref 30.0–36.0)
MCV: 87.3 fl (ref 78.0–100.0)
Monocytes Absolute: 0.5 10*3/uL (ref 0.1–1.0)
Monocytes Relative: 6.7 % (ref 3.0–12.0)
Neutro Abs: 3.2 10*3/uL (ref 1.4–7.7)
Neutrophils Relative %: 46.6 % (ref 43.0–77.0)
Platelets: 469 10*3/uL — ABNORMAL HIGH (ref 150.0–400.0)
RBC: 4.52 Mil/uL (ref 3.87–5.11)
RDW: 14.4 % (ref 11.5–15.5)
WBC: 6.8 10*3/uL (ref 4.0–10.5)

## 2022-09-09 LAB — HEMOGLOBIN A1C: Hgb A1c MFr Bld: 5.9 % (ref 4.6–6.5)

## 2022-09-09 LAB — TSH: TSH: 1.51 u[IU]/mL (ref 0.35–5.50)

## 2022-09-09 LAB — VITAMIN D 25 HYDROXY (VIT D DEFICIENCY, FRACTURES): VITD: 37.99 ng/mL (ref 30.00–100.00)

## 2022-09-09 LAB — VITAMIN B12: Vitamin B-12: 271 pg/mL (ref 211–911)

## 2022-09-09 NOTE — Progress Notes (Signed)
Established Patient Office Visit     CC/Reason for Visit: Annual preventive exam, discuss some acute on chronic concerns  HPI: [name] is a 57 y.o. female who is coming in today for the above mentioned reasons. Past Medical History is significant for: Morbid obesity, hypertension, hypothyroidism, essential thrombocytosis, iron deficiency anemia, obstructive sleep apnea, chronic heart block status post permanent pacemaker.  She was started recently on valsartan by cardiology due to elevated blood pressure.  She continues to follow with Dr. Myna Hidalgo in regards to her essential thrombocytosis.  Will obtain records from [facility] in regards to her recent Pap smear and mammogram.  She is overdue for shingles vaccine which she will start today.  She has been having issues with increased reflux, has seen GI and has been started on Pepcid.  She has been having frequent falls.  Her cardiologist has referred her to neurology.  She also has an upcoming appointment with rheumatology in regards to her mixed connective tissue disorder.   Past Medical/Surgical History: Past Medical History:  Diagnosis Date   Allergic rhinitis, cause unspecified    Allergy 2016   Anemia, iron deficiency    Arthritis    COVID-19 06/15/2020   Diastolic dysfunction    Diverticulosis of colon    GERD (gastroesophageal reflux disease)    Goiter    Hiatal hernia    Hypertension    IBS (irritable bowel syndrome)    Morbid obesity    Obstructive sleep apnea    Sleep apnea    Thrombocytosis    Undiagnosed cardiac murmurs     Past Surgical History:  Procedure Laterality Date   ABDOMINAL HYSTERECTOMY  05/2013   BARIATRIC SURGERY  06/17/2010   BILATERAL SALPINGECTOMY Bilateral 06/22/2014   Procedure: BILATERAL SALPINGECTOMY;  Surgeon: Jaymes Graff, MD;  Location: [facility];  Service: Gynecology;  Laterality: Bilateral;   CYSTOSCOPY N/A 06/22/2014   Procedure: CYSTOSCOPY;  Surgeon: Jaymes Graff,  MD;  Location: [facility];  Service: Gynecology;  Laterality: N/A;   ENDOMETRIAL ABLATION  12/08   FOOT SURGERY Left 11/24/2017   Dr Charlsie Merles   LAPAROSCOPIC ASSISTED VAGINAL HYSTERECTOMY N/A 06/22/2014   Procedure: LAPAROSCOPIC ASSISTED VAGINAL HYSTERECTOMY;  Surgeon: Jaymes Graff, MD;  Location: [facility];  Service: Gynecology;  Laterality: N/A;   LEAD REVISION/REPAIR N/A 12/17/2021   Procedure: LEAD REVISION/REPAIR;  Surgeon: Marinus Maw, MD;  [facility];  Service: Cardiovascular;  Laterality: N/A;   LIPOMA EXCISION     upper back   PACEMAKER IMPLANT N/A 12/09/2021   Procedure: PACEMAKER IMPLANT;  Surgeon: Marinus Maw, MD;  [facility];  Service: Cardiovascular;  Laterality: N/A;   SPINE SURGERY  02/10/2012   L4-l5 fusion--cohen    Social History:  reports that she has never smoked. She has never been exposed to tobacco smoke. She has never used smokeless tobacco. She reports that she does not drink alcohol and does not use drugs.  Allergies: Allergies  Allergen Reactions   Penicillin G Other (See Comments)   Penicillins Other (See Comments)    Causes severe, difficult to treat yeast infections    Family History:  Family History  Problem Relation Age of Onset   Breast cancer Mother 38   Arrhythmia Mother    Heart disease Mother    Arthritis Mother    Diabetes Mother    Hyperlipidemia Mother    Hypertension Mother    Hypertension Father    Kidney cancer  Father    Colon polyps Father    Lymphoma Father        Diffused Large B-cell Lymphoma   Breast cancer Maternal Aunt 35   Stroke Maternal Grandmother 38   Ovarian cancer Paternal Grandmother    Colon cancer Neg Hx      Current Outpatient Medications:    acetaminophen (TYLENOL) 325 MG tablet, Take 1-2 tablets (325-650 mg total) by mouth every 4 (four) hours as needed for mild pain., Disp: , Rfl:    Biotin 1000 MCG tablet, Take 1,000 mcg by mouth every other day., Disp: , Rfl:     celecoxib (CELEBREX) 200 MG capsule, Take 200 mg by mouth daily as needed for mild pain or moderate pain., Disp: , Rfl:    Cholecalciferol (VITAMIN D3) 50 MCG (2000 UT) TABS, Take 2,000 Units by mouth daily., Disp: , Rfl:    famotidine (PEPCID) 40 MG tablet, Take 40 mg by mouth 2 (two) times daily., Disp: , Rfl:    fluticasone (FLONASE) 50 MCG/ACT nasal spray, Place 2 sprays into both nostrils daily. (Patient taking differently: Place 2 sprays into both nostrils daily as needed for allergies.), Disp: 16 g, Rfl: 6   Glucosamine HCl (GLUCOSAMINE PO), Take 1,000 mg by mouth daily., Disp: , Rfl:    hydrochlorothiazide (HYDRODIURIL) 25 MG tablet, Take 1 tablet (25 mg total) by mouth daily., Disp: 90 tablet, Rfl: 1   Ibuprofen 200 MG CAPS, Take 400 mg by mouth daily as needed (For pain)., Disp: , Rfl:    levothyroxine (SYNTHROID) 88 MCG tablet, Take 1 tablet (88 mcg total) by mouth daily., Disp: 90 tablet, Rfl: 3   loratadine (CLARITIN) 10 MG tablet, TAKE 1 TABLET BY MOUTH EVERY DAY (Patient taking differently: Take 10 mg by mouth daily.), Disp: 90 tablet, Rfl: 2   Magnesium 250 MG TABS, Take 250 mg by mouth daily. For leg cramps, Disp: , Rfl:    Menthol, Topical Analgesic, (BIOFREEZE EX), Apply 1 application  topically daily as needed (On Knees and feet)., Disp: , Rfl:    metoprolol succinate (TOPROL XL) 25 MG 24 hr tablet, Take 0.5 tablets (12.5 mg total) by mouth 2 (two) times daily., Disp: 90 tablet, Rfl: 3   Misc Natural Products (YUMVS BEET ROOT-TART CHERRY) 250-0.5 MG CHEW, Chew 1 tablet by mouth 2 (two) times a week., Disp: , Rfl:    Turmeric 500 MG CAPS, Take 500 mg by mouth daily., Disp: , Rfl:    valsartan (DIOVAN) 160 MG tablet, Take 1 tablet (160 mg total) by mouth daily., Disp: 90 tablet, Rfl: 3  Review of Systems:  Negative unless indicated in HPI.   Physical Exam: Vitals:   09/09/22 0915  BP: 138/84  Pulse: (!) 59  Temp: 97.6 F (36.4 C)  TempSrc: Oral  SpO2: 100%  Weight: 242  lb 14.4 oz (110.2 kg)  Height:  (1.6 m)    Body mass index is 43.03 kg/m.   Physical Exam Vitals reviewed.  Constitutional:      General: She is not in acute distress.    Appearance: Normal appearance. She is not ill-appearing, toxic-appearing or diaphoretic.  HENT:     Head: Normocephalic.     Right Ear: Tympanic membrane, ear canal and external ear normal. There is no impacted cerumen.     Left Ear: Tympanic membrane, ear canal and external ear normal. There is no impacted cerumen.     Nose: Nose normal.     Mouth/Throat:  Mouth: Mucous membranes are moist.     Pharynx: Oropharynx is clear. No oropharyngeal exudate or posterior oropharyngeal erythema.  Eyes:     General: No scleral icterus.       Right eye: No discharge.        Left eye: No discharge.     Conjunctiva/sclera: Conjunctivae normal.     Pupils: Pupils are equal, round, and reactive to light.  Neck:     Vascular: No carotid bruit.  Cardiovascular:     Rate and Rhythm: Normal rate and regular rhythm.     Pulses: Normal pulses.     Heart sounds: Normal heart sounds.  Pulmonary:     Effort: Pulmonary effort is normal. No respiratory distress.     Breath sounds: Normal breath sounds.  Abdominal:     General: Abdomen is flat. Bowel sounds are normal.     Palpations: Abdomen is soft.  Musculoskeletal:        General: Normal range of motion.     Cervical back: Normal range of motion.  Skin:    General: Skin is warm and dry.  Neurological:     General: No focal deficit present.     Mental Status: She is alert and oriented to person, place, and time. Mental status is at baseline.  Psychiatric:        Mood and Affect: Mood normal.        Behavior: Behavior normal.        Thought Content: Thought content normal.        Judgment: Judgment normal.     Flowsheet Row Office Visit from 09/09/2022 in [facility] [place]  PHQ-9 Total Score 2        Impression and  Plan:  Encounter for preventive health examination  Essential hypertension - Plan: CBC with Differential/Platelet, Comprehensive metabolic panel  Vitamin D deficiency - Plan: Vitamin D, 25-hydroxy  Vitamin B12 deficiency - Plan: Vitamin B12  Mixed hyperlipidemia - Plan: Lipid panel  Prediabetes - Plan: Hemoglobin A1c  Hypothyroidism, unspecified type - Plan: TSH  Morbid obesity - Plan: Amb Ref to Medical Weight Management  Immunization due  Frequent falls - Plan: RPR  -Recommend routine eye and dental care. -Healthy lifestyle discussed in detail. -Labs to be updated today. -Prostate cancer screening: N/A Health Maintenance  Topic Date Due   COVID-19 Vaccine (3 - Pfizer risk series) 09/25/2022*   Zoster (Shingles) Vaccine (1 of 2) 12/09/2022*   Flu Shot  12/25/2022   Mammogram  02/24/2023   Colon Cancer Screening  10/10/2027   DTaP/Tdap/Td vaccine (4 - Td or Tdap) 08/08/2031   Hepatitis C Screening: USPSTF Recommendation to screen - Ages 23-79 yo.  Completed   HIV Screening  Completed   HPV Vaccine  Aged Out  *Topic was postponed. The date shown is not the original due date.   -First shingles vaccine administered today.   - Referral for healthy weight and wellness for her morbid obesity placed today. -Discussed healthy lifestyle, including increased physical activity and better food choices to promote weight loss. -For her frequent falls will check B12, RPR and TSH.  She has a neurology appointment coming up.       Chaya Jan, MD [facility]

## 2022-09-10 ENCOUNTER — Ambulatory Visit (INDEPENDENT_AMBULATORY_CARE_PROVIDER_SITE_OTHER): Payer: 59

## 2022-09-10 DIAGNOSIS — I442 Atrioventricular block, complete: Secondary | ICD-10-CM

## 2022-09-10 LAB — RPR: RPR Ser Ql: NONREACTIVE

## 2022-09-10 MED ORDER — BD SAFETYGLIDE SYRINGE/NEEDLE 25G X 1" 3 ML MISC: 3 refills | Status: DC

## 2022-09-10 MED ORDER — CYANOCOBALAMIN 1000 MCG/ML IJ SOLN: INTRAMUSCULAR | 11 refills | Status: AC

## 2022-09-11 ENCOUNTER — Ambulatory Visit: Payer: 59 | Attending: Rheumatology | Admitting: Rheumatology

## 2022-09-11 ENCOUNTER — Encounter: Payer: Self-pay | Admitting: Rheumatology

## 2022-09-11 VITALS — BP 133/89 | HR 63 | Resp 16 | Ht 63.0 in | Wt 243.6 lb

## 2022-09-11 DIAGNOSIS — D75839 Thrombocytosis, unspecified: Secondary | ICD-10-CM | POA: Diagnosis not present

## 2022-09-11 DIAGNOSIS — M5136 Other intervertebral disc degeneration, lumbar region: Secondary | ICD-10-CM

## 2022-09-11 DIAGNOSIS — Z950 Presence of cardiac pacemaker: Secondary | ICD-10-CM

## 2022-09-11 DIAGNOSIS — I10 Essential (primary) hypertension: Secondary | ICD-10-CM

## 2022-09-11 DIAGNOSIS — G4733 Obstructive sleep apnea (adult) (pediatric): Secondary | ICD-10-CM

## 2022-09-11 DIAGNOSIS — E538 Deficiency of other specified B group vitamins: Secondary | ICD-10-CM | POA: Diagnosis not present

## 2022-09-11 DIAGNOSIS — G894 Chronic pain syndrome: Secondary | ICD-10-CM

## 2022-09-11 DIAGNOSIS — M359 Systemic involvement of connective tissue, unspecified: Secondary | ICD-10-CM | POA: Diagnosis not present

## 2022-09-11 DIAGNOSIS — E559 Vitamin D deficiency, unspecified: Secondary | ICD-10-CM | POA: Diagnosis not present

## 2022-09-11 DIAGNOSIS — M170 Bilateral primary osteoarthritis of knee: Secondary | ICD-10-CM

## 2022-09-11 DIAGNOSIS — M1612 Unilateral primary osteoarthritis, left hip: Secondary | ICD-10-CM | POA: Diagnosis not present

## 2022-09-11 DIAGNOSIS — Z8719 Personal history of other diseases of the digestive system: Secondary | ICD-10-CM

## 2022-09-11 DIAGNOSIS — R5383 Other fatigue: Secondary | ICD-10-CM | POA: Diagnosis not present

## 2022-09-11 DIAGNOSIS — K449 Diaphragmatic hernia without obstruction or gangrene: Secondary | ICD-10-CM

## 2022-09-11 DIAGNOSIS — R748 Abnormal levels of other serum enzymes: Secondary | ICD-10-CM | POA: Diagnosis not present

## 2022-09-11 DIAGNOSIS — I519 Heart disease, unspecified: Secondary | ICD-10-CM

## 2022-09-11 DIAGNOSIS — D508 Other iron deficiency anemias: Secondary | ICD-10-CM | POA: Diagnosis not present

## 2022-09-11 DIAGNOSIS — K5730 Diverticulosis of large intestine without perforation or abscess without bleeding: Secondary | ICD-10-CM

## 2022-09-11 DIAGNOSIS — M2550 Pain in unspecified joint: Secondary | ICD-10-CM

## 2022-09-11 DIAGNOSIS — E039 Hypothyroidism, unspecified: Secondary | ICD-10-CM

## 2022-09-11 DIAGNOSIS — R21 Rash and other nonspecific skin eruption: Secondary | ICD-10-CM

## 2022-09-12 LAB — CUP PACEART REMOTE DEVICE CHECK
Battery Remaining Longevity: 174 mo
Battery Remaining Percentage: 100 %
Brady Statistic RA Percent Paced: 39 %
Brady Statistic RV Percent Paced: 0 %
Date Time Interrogation Session: 20240418164900
Implantable Lead Connection Status: 753985
Implantable Lead Connection Status: 753985
Implantable Lead Implant Date: 20230717
Implantable Lead Implant Date: 20230717
Implantable Lead Location: 753859
Implantable Lead Location: 753860
Implantable Lead Model: 7841
Implantable Lead Model: 7842
Implantable Lead Serial Number: 1204466
Implantable Lead Serial Number: 1298900
Implantable Pulse Generator Implant Date: 20230717
Lead Channel Impedance Value: 653 Ohm
Lead Channel Impedance Value: 761 Ohm
Lead Channel Pacing Threshold Amplitude: 0.8 V
Lead Channel Pacing Threshold Pulse Width: 0.4 ms
Lead Channel Setting Pacing Amplitude: 2.5 V
Lead Channel Setting Pacing Amplitude: 3.3 V
Lead Channel Setting Pacing Pulse Width: 1 ms
Lead Channel Setting Sensing Sensitivity: 1 mV
Pulse Gen Serial Number: 603605
Zone Setting Status: 755011

## 2022-09-12 LAB — ANA: Anti Nuclear Antibody (ANA): NEGATIVE

## 2022-09-12 LAB — PROTEIN / CREATININE RATIO, URINE

## 2022-09-12 LAB — C3 AND C4: C4 Complement: 38 mg/dL (ref 15–57)

## 2022-09-14 DIAGNOSIS — G4733 Obstructive sleep apnea (adult) (pediatric): Secondary | ICD-10-CM | POA: Diagnosis not present

## 2022-09-14 LAB — CARDIOLIPIN ANTIBODIES, IGG, IGM, IGA
Anticardiolipin IgA: 13.9 APL-U/mL (ref <20.0)
Anticardiolipin IgG: 14 GPL-U/mL (ref <20.0)
Anticardiolipin IgM: <2 MPL-U/mL (ref <20.0)

## 2022-09-14 LAB — ANTI-SCLERODERMA ANTIBODY: Scleroderma (Scl-70) (ENA) Antibody, IgG: <1 AI

## 2022-09-14 LAB — C3 AND C4: C3 Complement: 181 mg/dL (ref 83–193)

## 2022-09-14 LAB — PROTEIN / CREATININE RATIO, URINE
Creatinine, Urine: 41 mg/dL (ref 20–275)
Total Protein, Urine: <4 mg/dL — ABNORMAL LOW (ref 5–24)

## 2022-09-14 LAB — ANTI-DNA ANTIBODY, DOUBLE-STRANDED: ds DNA Ab: 2 IU/mL

## 2022-09-14 LAB — SJOGRENS SYNDROME-B EXTRACTABLE NUCLEAR ANTIBODY: SSB (La) (ENA) Antibody, IgG: <1 AI

## 2022-09-14 LAB — ANTI-SMITH ANTIBODY: ENA SM Ab Ser-aCnc: <1 AI

## 2022-09-14 LAB — RNP ANTIBODY: Ribonucleic Protein(ENA) Antibody, IgG: 4.5 AI — AB

## 2022-09-14 LAB — CYCLIC CITRUL PEPTIDE ANTIBODY, IGG: Cyclic Citrullin Peptide Ab: <16 UNITS

## 2022-09-14 LAB — BETA-2 GLYCOPROTEIN ANTIBODIES
Beta-2 Glyco 1 IgA: <2 U/mL (ref <20.0)
Beta-2 Glyco 1 IgM: <2 U/mL (ref <20.0)
Beta-2 Glyco I IgG: <2 U/mL (ref <20.0)

## 2022-09-14 LAB — SJOGRENS SYNDROME-A EXTRACTABLE NUCLEAR ANTIBODY: SSA (Ro) (ENA) Antibody, IgG: <1 AI

## 2022-09-14 LAB — RHEUMATOID FACTOR: Rheumatoid fact SerPl-aCnc: <10 IU/mL (ref <14)

## 2022-09-15 NOTE — Progress Notes (Signed)
Stable RNP antibody.  All other labs were unremarkable.  I will discuss results at the follow-up visit.

## 2022-09-19 NOTE — Addendum Note (Signed)
Addended by: Armanda Magic R on: 09/19/2022 04:40 PM   Modules accepted: Level of Service

## 2022-09-24 ENCOUNTER — Encounter: Payer: Self-pay | Admitting: Internal Medicine

## 2022-09-24 ENCOUNTER — Encounter: Payer: Self-pay | Admitting: Rheumatology

## 2022-09-24 NOTE — Telephone Encounter (Signed)
Her alkaline phosphatase is only mildly elevated.  It is not as significant abnormality.  It could be read normal in one lab and abnormal in the other lab.  We will continue to follow labs.

## 2022-09-24 NOTE — Telephone Encounter (Signed)
Both labs are accurate.  The normal range is different for each lab.  We have to compare lab values with the normal range given by the labs.

## 2022-09-25 NOTE — Progress Notes (Signed)
Office Visit Note  Patient: [name]             Date of Birth: [birth year]           MRN: [id number]             PCP: [name], [name] Patricia, MD Referring: [name], [name] Bar* Visit Date: 10/09/2022 Occupation: @GUAROCC @  Subjective:  Fatigue and abnormal labs  History of Present Illness: [name] is a 57 y.o. female returns today for follow-up visit.  She continues to have pain and discomfort in her joints and generalized fatigue.  She has known history of positive ANA and positive RNP for several years.  She states she has dry mouth and dry eyes which she relates to use of Zyrtec and also CPAP.  She has noticed some insulin rash in the past which was diagnosed as eczema by her dermatologist.  She continues to have discomfort in her joints and her bones and muscles.  She has not noticed much joint swelling.  She continues to have discomfort over the left trochanteric bursa.  She has intermittent discomfort in her right ankle joint.  She denies history of shortness of breath or Raynaud's phenomenon.  She gives history of palpitations.    Activities of Daily Living:  Patient reports morning stiffness for 5-10 minutes.   Patient Reports nocturnal pain.  Difficulty dressing/grooming: Denies Difficulty climbing stairs: Reports Difficulty getting out of chair: Reports Difficulty using hands for taps, buttons, cutlery, and/or writing: Reports  Review of Systems  Constitutional:  Positive for fatigue.  HENT:  Positive for mouth dryness. Negative for mouth sores.   Eyes:  Positive for dryness.  Respiratory:  Negative for shortness of breath.   Cardiovascular:  Positive for palpitations. Negative for chest pain.  Gastrointestinal:  Negative for blood in stool, constipation and diarrhea.  Endocrine: Negative for increased urination.  Genitourinary:  Negative for involuntary urination.  Musculoskeletal:  Positive for joint pain, gait problem, joint pain, morning  stiffness and muscle tenderness. Negative for joint swelling, myalgias, muscle weakness and myalgias.  Skin:  Negative for color change, rash, hair loss and sensitivity to sunlight.  Allergic/Immunologic: Positive for susceptible to infections.  Neurological:  Positive for light-headedness. Negative for dizziness and headaches.  Hematological:  Negative for swollen glands.  Psychiatric/Behavioral:  Negative for depressed mood and sleep disturbance. The patient is not nervous/anxious.     PMFS History:  Patient Active Problem List   Diagnosis Date Noted   Atrial fibrillation (HCC) 02/12/2022   Pacemaker complications 12/17/2021   Transient complete heart block (HCC) 12/16/2021   Pacemaker 12/16/2021   SOB (shortness of breath) 09/17/2021   Murmur 09/17/2021   Chest pain of uncertain etiology 09/17/2021   Acute pain of right shoulder 01/01/2021   Sinus congestion 12/17/2020   Abnormal stools 01/24/2020   Dizziness 01/24/2020   Leg swelling 01/24/2020   Primary generalized (osteo)arthritis 09/21/2019   Elevated serum alkaline phosphatase level 09/21/2019   Rash 09/21/2019   Left hip pain 09/21/2019   Bradycardia 11/11/2018   Vitamin D deficiency 11/11/2018   Vitamin B12 deficiency 11/11/2018   Hypokalemia 11/11/2018   Thrombocytosis 11/11/2018   Bleeding 12/02/2017   Polymenorrhea 12/02/2017   Polyp of cervix 12/02/2017   Urinary incontinence 12/02/2017   Pain of right upper extremity 09/14/2017   Hypersomnia 06/05/2016   Abdominal pain, chronic, epigastric 04/13/2015   Irregular bleeding 06/22/2014   Obesity (BMI 30-39.9) 07/25/2013   Breast mass 07/25/2013  Changing mole 01/28/2013   Dysphagia, pharyngoesophageal phase 03/10/2011   Bariatric surgery status 03/10/2011   Esophageal ring 03/10/2011   SPINAL STENOSIS, LUMBAR 04/08/2010   HIATAL HERNIA 03/08/2010   Connective tissue disease (HCC) 03/08/2010   DYSPNEA 12/13/2009   DIASTOLIC DYSFUNCTION 12/11/2009    ECHOCARDIOGRAM, ABNORMAL 12/11/2009   PALPITATIONS, OCCASIONAL 12/05/2009   CARDIAC MURMUR 12/05/2009   PAIN IN JOINT, MULTIPLE SITES 07/11/2009   MALAISE AND FATIGUE 07/05/2009   Obstructive sleep apnea 09/25/2008   GERD 01/04/2008   IBS 01/04/2008   Goiter 11/30/2007   MORBID OBESITY 11/22/2007   ANEMIA 08/23/2007   Allergic rhinitis 08/23/2007   DIVERTICULOSIS, COLON 08/23/2007   Other iron deficiency anemias 12/23/2006   Hypothyroidism 12/17/2006   Disease of blood and blood forming organ 12/17/2006   Essential hypertension 12/17/2006   LOW BACK PAIN 12/17/2006    Past Medical History:  Diagnosis Date   Allergic rhinitis, cause unspecified    Allergy 2016   Anemia, iron deficiency    Arthritis    COVID-19 06/15/2020   Diastolic dysfunction    Diverticulosis of colon    GERD (gastroesophageal reflux disease)    Goiter    Hiatal hernia    Hypertension    IBS (irritable bowel syndrome)    Morbid obesity (HCC)    Obstructive sleep apnea    Sleep apnea    Thrombocytosis    Undiagnosed cardiac murmurs     Family History  Problem Relation Age of Onset   Breast cancer Mother 66   Arrhythmia Mother    Heart disease Mother    Arthritis Mother    Diabetes Mother    Hyperlipidemia Mother    Hypertension Mother    Hypertension Father    Kidney cancer Father    Colon polyps Father    Lymphoma Father        Diffused Large B-cell Lymphoma   Cancer Father    Breast cancer Maternal Aunt 76   Stroke Maternal Grandmother 31   Ovarian cancer Paternal Grandmother    Healthy Daughter    Healthy Daughter    Healthy Daughter    Colon cancer Neg Hx    Past Surgical History:  Procedure Laterality Date   BARIATRIC SURGERY  06/17/2010   BILATERAL SALPINGECTOMY Bilateral 06/22/2014   Procedure: BILATERAL SALPINGECTOMY;  Surgeon: Jaymes Graff, MD;  Location: [facility];  Service: Gynecology;  Laterality: Bilateral;   CYSTOSCOPY N/A 06/22/2014   Procedure: CYSTOSCOPY;  Surgeon:  Jaymes Graff, MD;  Location: [facility];  Service: Gynecology;  Laterality: N/A;   ENDOMETRIAL ABLATION  04/2007   FOOT SURGERY Left 11/24/2017   Dr Charlsie Merles   LAPAROSCOPIC ASSISTED VAGINAL HYSTERECTOMY N/A 06/22/2014   Procedure: LAPAROSCOPIC ASSISTED VAGINAL HYSTERECTOMY;  Surgeon: Jaymes Graff, MD;  Location: [facility];  Service: Gynecology;  Laterality: N/A;   LEAD REVISION/REPAIR N/A 12/17/2021   Procedure: LEAD REVISION/REPAIR;  Surgeon: Marinus Maw, MD;  [facility];  Service: Cardiovascular;  Laterality: N/A;   LIPOMA EXCISION     upper back   PACEMAKER IMPLANT N/A 12/09/2021   Procedure: PACEMAKER IMPLANT;  Surgeon: Marinus Maw, MD;  [facility];  Service: Cardiovascular;  Laterality: N/A;   SPINE SURGERY  02/10/2012   L4-l5 fusion--cohen   Social History   Social History Narrative   09/21/19   From: [facility], [place] originally   Living: with husband, Earl Lites and 2 daughters w/ family   Work: homemaker  Family: 3 children - [place], [place], and [employer] [facility]. Has 8 grandchildren and one on the way      Enjoys: spend time with family, shopping      Exercise: trying to walk more   Diet: eats late at night, will go most of the day w/o eating      Safety   Seat belts: Yes    Guns: Yes  and secure   Safe in relationships: Yes    Immunization History  Administered Date(s) Administered   PFIZER(Purple Top)SARS-COV-2 Vaccination 10/06/2019, 10/31/2019   Td 02/07/2009   Tdap 01/24/2009, 08/07/2021   Zoster Recombinat (Shingrix) 09/09/2022     Objective: Vital Signs: BP 136/82 (BP Location: Left Arm, Patient Position: Sitting, Cuff Size: Large)   Pulse 62   Resp 16   Ht 5\' 3"  (1.6 m)   Wt 245 lb 12.8 oz (111.5 kg)   LMP 01/20/2011   BMI 43.54 kg/m    Physical Exam Vitals and nursing note reviewed.  Constitutional:      Appearance: She is well-developed.  HENT:     Head: Normocephalic and atraumatic.  Eyes:     Conjunctiva/sclera:  Conjunctivae normal.  Cardiovascular:     Rate and Rhythm: Normal rate and regular rhythm.     Heart sounds: Normal heart sounds.  Pulmonary:     Effort: Pulmonary effort is normal.     Breath sounds: Normal breath sounds.     Comments: Lungs were clear to auscultation. Abdominal:     General: Bowel sounds are normal.     Palpations: Abdomen is soft.  Musculoskeletal:     Cervical back: Normal range of motion.  Lymphadenopathy:     Cervical: No cervical adenopathy.  Skin:    General: Skin is warm and dry.     Capillary Refill: Capillary refill takes less than 2 seconds.     Comments: No sclerodactyly, nailbed capillary changes was noted.  She had good capillary refill.  Neurological:     Mental Status: She is alert and oriented to person, place, and time.  Psychiatric:        Behavior: Behavior normal.      Musculoskeletal Exam: Spine was in good range of motion.  Shoulder joints, elbow joints, wrist joints, MCPs PIPs and DIPs were in good range of motion with no synovitis.  Hip joints and knee joints in good range of motion without any warmth swelling or effusion.  There was no tenderness over ankles or MTPs.  CDAI Exam: CDAI Score: -- Patient Global: --; Provider Global: -- Swollen: --; Tender: -- Joint Exam 10/09/2022   No joint exam has been documented for this visit   There is currently no information documented on the homunculus. Go to the Rheumatology activity and complete the homunculus joint exam.  Investigation: No additional findings.  Imaging: CUP PACEART REMOTE DEVICE CHECK  Result Date: 09/12/2022 Scheduled remote reviewed. Normal device function.  1 SVT, 1 ATR both 2/17, EGM's show atrial driven 1:1, longest duration 32sec, HR's 160's Next remote 91 days. LA, CVRS   Recent Labs: Lab Results  Component Value Date   WBC 6.8 09/09/2022   HGB 13.0 09/09/2022   PLT 469.0 (H) 09/09/2022   NA 138 09/09/2022   K 3.9 09/09/2022   CL 100 09/09/2022   CO2 31  09/09/2022   GLUCOSE 88 09/09/2022   BUN 16 09/09/2022   CREATININE 0.63 09/09/2022   BILITOT 0.4 09/09/2022   ALKPHOS 131 (H) 09/09/2022   AST 21  09/09/2022   ALT 18 09/09/2022   PROT 7.4 09/09/2022   ALBUMIN 4.3 09/09/2022   CALCIUM 9.6 09/09/2022   GFRAA >60 01/27/2020     Speciality Comments: No specialty comments available.  Procedures:  No procedures performed Allergies: Penicillin g and Penicillins   Assessment / Plan:     Visit Diagnoses: Positive RNP antibody - Diagnosed as undifferentiated connective tissue disease by Dr. Nickola Major and was under her care till 2017.  Arthralgias, sicca symptoms. -09/11/22 urine protein creatinine normal, beta-2 GP 1 negative, anticardiolipin negative, C3-C4 normal, ANA negative, RNP 4.5, (SCL 70, Smith, SSA, SSB, dsDNA negative), RF negative, anti-CCP negative.  Lab results were discussed with the patient at length.  Patient continues to have some arthralgias, myalgias, sicca symptoms.  She relates sicca symptoms to the use of Zyrtec and CPAP use.  No synovitis was noted on the examination.  There is no history of shortness of breath or Raynaud's phenomenon.  At this point she does not meet criteria for an autoimmune disease.  I advised her to contact me if she develops any new symptoms.  She has had positive ANA and positive RNP for many years per patient.  Now only RNP is positive.  Association of mixed connective tissue disease with RNP was discussed.  If she develops any shortness of breath she should notify me.  I will repeat labs in 1 year.  Plan: Protein / creatinine ratio, urine, CBC with Differential/Platelet, COMPLETE METABOLIC PANEL WITH GFR, Sedimentation rate, C3 and C4, Anti-DNA antibody, double-stranded, ANA, RNP Antibody  Rash - Diagnosed as eczema by the dermatologist per patient.  She states she gets few spots on her skin.  She will schedule an appointment with the dermatologist.  Other fatigue-she gives history of chronic  fatigue.  Primary osteoarthritis of left hip - Mild to moderate osteoarthritis on the x-rays done by Dr. Eulah Pont.  She had tenderness over left trochanteric bursa consistent with trochanteric bursitis.  A handout on IT band stretches was given.  Primary osteoarthritis of both knees -she had good range of motion of bilateral knee joints without any warmth swelling or effusion.  Patient states that she was advised total knee replacement.  DDD (degenerative disc disease), lumbar - S/p fusion in 2013 by Dr. Noel Gerold.  Patient has discomfort in her lower back.  Polyarthralgia - History of pain in all the joints and muscles.  No synovitis was noted.    Elevated serum alkaline phosphatase level - Mild elevation of alkaline phosphatase for many years.  Thrombocytosis - Followed by Dr. Myna Hidalgo  Vitamin B12 deficiency  Other iron deficiency anemias  Vitamin D deficiency  Chronic pain syndrome  Essential hypertension  DIASTOLIC DYSFUNCTION  Pacemaker - 2023 for intermittent AV block  Hiatal hernia  History of gastroesophageal reflux (GERD)  Colon, diverticulosis  Acquired hypothyroidism  Obstructive sleep apnea  MORBID OBESITY  Orders: Orders Placed This Encounter  Procedures   Protein / creatinine ratio, urine   CBC with Differential/Platelet   COMPLETE METABOLIC PANEL WITH GFR   Sedimentation rate   C3 and C4   Anti-DNA antibody, double-stranded   ANA   RNP Antibody   No orders of the defined types were placed in this encounter.    Follow-Up Instructions: Return in about 1 year (around 10/09/2023) for +RNP.   Pollyann Savoy, MD  Note - This record has been created using Animal nutritionist.  Chart creation errors have been sought, but may not always  have been located. Such creation  errors do not reflect on  the standard of medical care.

## 2022-09-25 NOTE — Telephone Encounter (Signed)
I called patient, Her alkaline phosphatase is only mildly elevated.  It is not as significant abnormality.  It could be read normal in one lab and abnormal in the other lab.  We will continue to follow labs.

## 2022-09-28 DIAGNOSIS — G4733 Obstructive sleep apnea (adult) (pediatric): Secondary | ICD-10-CM | POA: Diagnosis not present

## 2022-10-09 ENCOUNTER — Ambulatory Visit: Payer: 59 | Attending: Rheumatology | Admitting: Rheumatology

## 2022-10-09 ENCOUNTER — Encounter: Payer: Self-pay | Admitting: Rheumatology

## 2022-10-09 VITALS — BP 136/82 | HR 62 | Resp 16 | Ht 63.0 in | Wt 245.8 lb

## 2022-10-09 DIAGNOSIS — M5136 Other intervertebral disc degeneration, lumbar region: Secondary | ICD-10-CM

## 2022-10-09 DIAGNOSIS — R768 Other specified abnormal immunological findings in serum: Secondary | ICD-10-CM

## 2022-10-09 DIAGNOSIS — M170 Bilateral primary osteoarthritis of knee: Secondary | ICD-10-CM | POA: Diagnosis not present

## 2022-10-09 DIAGNOSIS — M1612 Unilateral primary osteoarthritis, left hip: Secondary | ICD-10-CM | POA: Diagnosis not present

## 2022-10-09 DIAGNOSIS — R21 Rash and other nonspecific skin eruption: Secondary | ICD-10-CM

## 2022-10-09 DIAGNOSIS — M2550 Pain in unspecified joint: Secondary | ICD-10-CM | POA: Diagnosis not present

## 2022-10-09 DIAGNOSIS — D508 Other iron deficiency anemias: Secondary | ICD-10-CM | POA: Diagnosis not present

## 2022-10-09 DIAGNOSIS — E039 Hypothyroidism, unspecified: Secondary | ICD-10-CM

## 2022-10-09 DIAGNOSIS — D75839 Thrombocytosis, unspecified: Secondary | ICD-10-CM

## 2022-10-09 DIAGNOSIS — I10 Essential (primary) hypertension: Secondary | ICD-10-CM

## 2022-10-09 DIAGNOSIS — E559 Vitamin D deficiency, unspecified: Secondary | ICD-10-CM | POA: Diagnosis not present

## 2022-10-09 DIAGNOSIS — G894 Chronic pain syndrome: Secondary | ICD-10-CM

## 2022-10-09 DIAGNOSIS — K449 Diaphragmatic hernia without obstruction or gangrene: Secondary | ICD-10-CM

## 2022-10-09 DIAGNOSIS — G4733 Obstructive sleep apnea (adult) (pediatric): Secondary | ICD-10-CM

## 2022-10-09 DIAGNOSIS — R748 Abnormal levels of other serum enzymes: Secondary | ICD-10-CM

## 2022-10-09 DIAGNOSIS — R5383 Other fatigue: Secondary | ICD-10-CM | POA: Diagnosis not present

## 2022-10-09 DIAGNOSIS — Z950 Presence of cardiac pacemaker: Secondary | ICD-10-CM

## 2022-10-09 DIAGNOSIS — K5730 Diverticulosis of large intestine without perforation or abscess without bleeding: Secondary | ICD-10-CM

## 2022-10-09 DIAGNOSIS — E538 Deficiency of other specified B group vitamins: Secondary | ICD-10-CM

## 2022-10-09 DIAGNOSIS — Z8719 Personal history of other diseases of the digestive system: Secondary | ICD-10-CM

## 2022-10-09 DIAGNOSIS — I519 Heart disease, unspecified: Secondary | ICD-10-CM

## 2022-10-09 NOTE — Patient Instructions (Signed)
Iliotibial Band Syndrome Rehab Ask your health care provider which exercises are safe for you. Do exercises exactly as told by your health care provider and adjust them as directed. It is normal to feel mild stretching, pulling, tightness, or discomfort as you do these exercises. Stop right away if you feel sudden pain or your pain gets significantly worse. Do not begin these exercises until told by your health care provider. Stretching and range-of-motion exercises These exercises warm up your muscles and joints and improve the movement and flexibility of your hip and pelvis. Quadriceps stretch, prone  Lie on your abdomen (prone position) on a firm surface, such as a bed or padded floor. Bend your left / right knee and reach back to hold your ankle or pant leg. If you cannot reach your ankle or pant leg, loop a belt around your foot and grab the belt instead. Gently pull your heel toward your buttocks. Your knee should not slide out to the side. You should feel a stretch in the front of your thigh and knee (quadriceps). Hold this position for __________ seconds. Repeat __________ times. Complete this exercise __________ times a day. Iliotibial band stretch An iliotibial band is a strong band of muscle tissue that runs from the outer side of your hip to the outer side of your thigh and knee. Lie on your side with your left / right leg in the top position. Bend both of your knees and grab your left / right ankle. Stretch out your bottom arm to help you balance. Slowly bring your top knee back so your thigh goes behind your trunk. Slowly lower your top leg toward the floor until you feel a gentle stretch on the outside of your left / right hip and thigh. If you do not feel a stretch and your knee will not fall farther, place the heel of your other foot on top of your knee and pull your knee down toward the floor with your foot. Hold this position for __________ seconds. Repeat __________ times.  Complete this exercise __________ times a day. Strengthening exercises These exercises build strength and endurance in your hip and pelvis. Endurance is the ability to use your muscles for a long time, even after they get tired. Straight leg raises, side-lying This exercise strengthens the muscles that rotate the leg at the hip and move it away from your body (hip abductors). Lie on your side with your left / right leg in the top position. Lie so your head, shoulder, hip, and knee line up. You may bend your bottom knee to help you balance. Roll your hips slightly forward so your hips are stacked directly over each other and your left / right knee is facing forward. Tense the muscles in your outer thigh and lift your top leg 4-6 inches (10-15 cm). Hold this position for __________ seconds. Slowly lower your leg to return to the starting position. Let your muscles relax completely before doing another repetition. Repeat __________ times. Complete this exercise __________ times a day. Leg raises, prone This exercise strengthens the muscles that move the hips backward (hip extensors). Lie on your abdomen (prone position) on your bed or a firm surface. You can put a pillow under your hips if that is more comfortable for your lower back. Bend your left / right knee so your foot is straight up in the air. Squeeze your buttocks muscles and lift your left / right thigh off the bed. Do not let your back arch. Tense   your thigh muscle as hard as you can without increasing any knee pain. Hold this position for __________ seconds. Slowly lower your leg to return to the starting position and allow it to relax completely. Repeat __________ times. Complete this exercise __________ times a day. Hip hike Stand sideways on a bottom step. Stand on your left / right leg with your other foot unsupported next to the step. You can hold on to a railing or wall for balance if needed. Keep your knees straight and your  torso square. Then lift your left / right hip up toward the ceiling. Slowly let your left / right hip lower toward the floor, past the starting position. Your foot should get closer to the floor. Do not lean or bend your knees. Repeat __________ times. Complete this exercise __________ times a day. This information is not intended to replace advice given to you by your health care provider. Make sure you discuss any questions you have with your health care provider. Document Revised: 07/20/2019 Document Reviewed: 07/20/2019 Elsevier Patient Education  2023 [employer] Inc.  

## 2022-10-13 ENCOUNTER — Other Ambulatory Visit: Payer: Self-pay

## 2022-10-13 DIAGNOSIS — M351 Other overlap syndromes: Secondary | ICD-10-CM

## 2022-10-13 NOTE — Progress Notes (Signed)
Remote pacemaker transmission.   

## 2022-10-13 NOTE — Progress Notes (Signed)
Referral for [facility] for undifferentiated mixed connective tissue disorder made per request from Dr. Servando Salina.

## 2022-10-27 ENCOUNTER — Encounter: Payer: Self-pay | Admitting: Internal Medicine

## 2022-10-28 ENCOUNTER — Other Ambulatory Visit: Payer: Self-pay

## 2022-10-28 DIAGNOSIS — M351 Other overlap syndromes: Secondary | ICD-10-CM

## 2022-10-29 ENCOUNTER — Telehealth: Payer: Self-pay

## 2022-10-29 DIAGNOSIS — G4733 Obstructive sleep apnea (adult) (pediatric): Secondary | ICD-10-CM | POA: Diagnosis not present

## 2022-10-29 NOTE — Telephone Encounter (Signed)
Faxed referral to [facility]ation for Rheumatology.

## 2022-10-30 ENCOUNTER — Ambulatory Visit: Payer: 59 | Admitting: Neurology

## 2022-10-30 ENCOUNTER — Encounter: Payer: Self-pay | Admitting: Neurology

## 2022-10-30 VITALS — BP 111/68 | HR 60 | Ht 63.0 in | Wt 245.0 lb

## 2022-10-30 DIAGNOSIS — R4189 Other symptoms and signs involving cognitive functions and awareness: Secondary | ICD-10-CM | POA: Diagnosis not present

## 2022-10-30 DIAGNOSIS — R4789 Other speech disturbances: Secondary | ICD-10-CM | POA: Diagnosis not present

## 2022-10-30 DIAGNOSIS — E538 Deficiency of other specified B group vitamins: Secondary | ICD-10-CM | POA: Diagnosis not present

## 2022-10-30 DIAGNOSIS — W19XXXD Unspecified fall, subsequent encounter: Secondary | ICD-10-CM

## 2022-10-30 MED ORDER — ROSUVASTATIN CALCIUM 5 MG PO TABS
5.0000 mg | ORAL_TABLET | Freq: Every evening | ORAL | 11 refills | Status: DC
Start: 2022-10-30 — End: 2024-02-25

## 2022-10-30 NOTE — Progress Notes (Signed)
[facility]  PATIENT: [name] DOB: 12/27/1965  REQUESTING CLINICIAN: Thomasene Ripple, DO HISTORY FROM: Patient  REASON FOR VISIT: Word finding difficulty    HISTORICAL  CHIEF COMPLAINT:  Chief Complaint  Patient presents with   New Patient (Initial Visit)    Rm12, alone Aphasia:ongoing for a year cannot form words she knows what she trying to say but the words are not able to form    HISTORY OF PRESENT ILLNESS:  This is a 57 year old woman past medical history of hypertension, hyperlipidemia, heart disease, obstructive sleep apnea on CPAP, vitamin B12 deficiency who is presenting with complaint of word finding difficulty.  Patient reports the word finding difficulty started since last year, early last year.  She had difficulty finding the right word.  At that time she was also having heart trouble, and episode of presyncope. She was found to be in heart block and had a pacemaker in July 2023. Right after the pacemaker placement, her word finding difficulty got better but now she is having worsening problems, more finding difficulty and memory problem.  She does live at home with her husband, her husband has complained about her memory problem, her forgetfulness but she is still independent in all activities of daily living.  She has to write thing down, on one occasion, left the stove on. She also has been reporting falls, she reported a total of 3 falls this year, unexplained fall, denies any mechanical fall, no prodrome of lightheadedness change in vision prior to the fall.  2 falls at home and 1 at [facility]. She denies any major injury from this fall.  Her last fall was 2 months ago.    OTHER MEDICAL CONDITIONS: Hypertension, Hyperlipidemia, Heart disease, B12 deficiency, OSA on CPAP   REVIEW OF SYSTEMS: Full 14 system review of systems performed and negative with exception of: As noted in the HPI   ALLERGIES: Allergies  Allergen Reactions    Penicillin G Other (See Comments)   Penicillins Other (See Comments)    Causes severe, difficult to treat yeast infections    HOME MEDICATIONS: Outpatient Medications Prior to Visit  Medication Sig Dispense Refill   acetaminophen (TYLENOL) 325 MG tablet Take 1-2 tablets (325-650 mg total) by mouth every 4 (four) hours as needed for mild pain.     Biotin 1000 MCG tablet Take 1,000 mcg by mouth every other day.     celecoxib (CELEBREX) 200 MG capsule Take 200 mg by mouth daily as needed for mild pain or moderate pain.     cetirizine (ZYRTEC) 10 MG tablet Take 10 mg by mouth daily.     Cholecalciferol (VITAMIN D3) 50 MCG (2000 UT) TABS Take 2,000 Units by mouth daily.     COLLAGEN PO Take by mouth. Liquid collagen with biotin     cyanocobalamin (VITAMIN B12) 1000 MCG/ML injection Inject 1 ml into the muscle once a week for a month.  Then inject 1 ml into the muscle once a month thereafter. 6 mL 11   famotidine (PEPCID) 40 MG tablet Take 40 mg by mouth 2 (two) times daily.     fluticasone (FLONASE) 50 MCG/ACT nasal spray Place 2 sprays into both nostrils daily. (Patient taking differently: Place 2 sprays into both nostrils daily as needed for allergies.) 16 g 6   Glucosamine HCl (GLUCOSAMINE PO) Take 1,000 mg by mouth daily.     hydrochlorothiazide (HYDRODIURIL) 25 MG tablet Take 1 tablet (25 mg total) by mouth daily. 90 tablet 1  Ibuprofen 200 MG CAPS Take 400 mg by mouth daily as needed (For pain).     levothyroxine (SYNTHROID) 88 MCG tablet Take 1 tablet (88 mcg total) by mouth daily. 90 tablet 3   loratadine (CLARITIN) 10 MG tablet TAKE 1 TABLET BY MOUTH EVERY DAY 90 tablet 2   Magnesium 250 MG TABS Take 250 mg by mouth daily. For leg cramps     Menthol, Topical Analgesic, (BIOFREEZE EX) Apply 1 application  topically daily as needed (On Knees and feet).     metoprolol succinate (TOPROL XL) 25 MG 24 hr tablet Take 0.5 tablets (12.5 mg total) by mouth 2 (two) times daily. 90 tablet 3   Misc  Natural Products (YUMVS BEET ROOT-TART CHERRY) 250-0.5 MG CHEW Chew 1 tablet by mouth 2 (two) times a week.     SYRINGE-NEEDLE, DISP, 3 ML (BD SAFETYGLIDE SYRINGE/NEEDLE) 25G X 1" 3 ML MISC Use for B12 injections 100 each 3   Turmeric 500 MG CAPS Take 500 mg by mouth as needed.     valsartan (DIOVAN) 160 MG tablet Take 1 tablet (160 mg total) by mouth daily. 90 tablet 3   No facility-administered medications prior to visit.    PAST MEDICAL HISTORY: Past Medical History:  Diagnosis Date   Allergic rhinitis, cause unspecified    Allergy 2016   Anemia, iron deficiency    Arthritis    COVID-19 06/15/2020   Diastolic dysfunction    Diverticulosis of colon    GERD (gastroesophageal reflux disease)    Goiter    Hiatal hernia    Hypertension    IBS (irritable bowel syndrome)    Morbid obesity (HCC)    Obstructive sleep apnea    Sleep apnea    Thrombocytosis    Undiagnosed cardiac murmurs     PAST SURGICAL HISTORY: Past Surgical History:  Procedure Laterality Date   BARIATRIC SURGERY  06/17/2010   BILATERAL SALPINGECTOMY Bilateral 06/22/2014   Procedure: BILATERAL SALPINGECTOMY;  Surgeon: Jaymes Graff, MD;  Location: [facility];  Service: Gynecology;  Laterality: Bilateral;   CYSTOSCOPY N/A 06/22/2014   Procedure: CYSTOSCOPY;  Surgeon: Jaymes Graff, MD;  Location: [facility];  Service: Gynecology;  Laterality: N/A;   ENDOMETRIAL ABLATION  04/2007   FOOT SURGERY Left 11/24/2017   Dr Charlsie Merles   LAPAROSCOPIC ASSISTED VAGINAL HYSTERECTOMY N/A 06/22/2014   Procedure: LAPAROSCOPIC ASSISTED VAGINAL HYSTERECTOMY;  Surgeon: Jaymes Graff, MD;  Location: [facility];  Service: Gynecology;  Laterality: N/A;   LEAD REVISION/REPAIR N/A 12/17/2021   Procedure: LEAD REVISION/REPAIR;  Surgeon: Marinus Maw, MD;  Location: [facility];  Service: Cardiovascular;  Laterality: N/A;   LIPOMA EXCISION     upper back   PACEMAKER IMPLANT N/A 12/09/2021   Procedure: PACEMAKER IMPLANT;  Surgeon: Marinus Maw, MD;  Location: [facility];  Service: Cardiovascular;  Laterality: N/A;   SPINE SURGERY  02/10/2012   L4-l5 fusion--cohen    FAMILY HISTORY: Family History  Problem Relation Age of Onset   Breast cancer Mother 55   Arrhythmia Mother    Heart disease Mother    Arthritis Mother    Diabetes Mother    Hyperlipidemia Mother    Hypertension Mother    Hypertension Father    Kidney cancer Father    Colon polyps Father    Lymphoma Father        Diffused Large B-cell Lymphoma   Cancer Father    Breast cancer Maternal Aunt 67   Stroke Maternal Grandmother  85   Ovarian cancer Paternal Grandmother    Healthy Daughter    Healthy Daughter    Healthy Daughter    Colon cancer Neg Hx     SOCIAL HISTORY: Social History   Socioeconomic History   Marital status: Married    Spouse name: Earl Lites   Number of children: 3   Years of education: Some college   Highest education level: Not on file  Occupational History   Occupation: Herbalist: UNEMPLOYED  Tobacco Use   Smoking status: Never    Passive exposure: Never   Smokeless tobacco: Never   Tobacco comments:    Never Used Tobacco  Vaping Use   Vaping Use: Never used  Substance and Sexual Activity   Alcohol use: No   Drug use: No   Sexual activity: Yes    Partners: Male    Birth control/protection: Surgical    Comment: Hysterectomy  Other Topics Concern   Not on file  Social History Narrative   09/21/19   From: [place], [place] originally   Living: with husband, Earl Lites and 2 daughters w/ family   Work: homemaker      Family: 3 children - [place], [place], and Hospital doctor. Has 8 grandchildren and one on the way      Enjoys: spend time with family, shopping      Exercise: trying to walk more   Diet: eats late at night, will go most of the day w/o eating      Safety   Seat belts: Yes    Guns: Yes  and secure   Safe in relationships: Yes    Social Determinants of Health   Financial Resource  Strain: Not on file  Food Insecurity: Not on file  Transportation Needs: Not on file  Physical Activity: Not on file  Stress: Not on file  Social Connections: Not on file  Intimate Partner Violence: Not on file    PHYSICAL EXAM  GENERAL EXAM/CONSTITUTIONAL: Vitals:  Vitals:   10/30/22 0928  BP: 111/68  Pulse: 60  Weight: 245 lb (111.1 kg)  Height: 5\' 3"  (1.6 m)   Body mass index is 43.4 kg/m. Wt Readings from Last 3 Encounters:  10/30/22 245 lb (111.1 kg)  10/09/22 245 lb 12.8 oz (111.5 kg)  09/11/22 243 lb 9.6 oz (110.5 kg)   Patient is in no distress; well developed, nourished and groomed; neck is supple  MUSCULOSKELETAL: Gait, strength, tone, movements noted in Neurologic exam below  NEUROLOGIC: MENTAL STATUS:      No data to display         awake, alert, oriented to person, place and time recent and remote memory intact normal attention and concentration language fluent, comprehension intact, naming intact fund of knowledge appropriate  CRANIAL NERVE:  2nd, 3rd, 4th, 6th - Visual fields full to confrontation, extraocular muscles intact, no nystagmus 5th - facial sensation symmetric 7th - facial strength symmetric 8th - hearing intact 9th - palate elevates symmetrically, uvula midline 11th - shoulder shrug symmetric 12th - tongue protrusion midline  MOTOR:  normal bulk and tone, full strength in the BUE, BLE  SENSORY:  normal and symmetric to light touch  COORDINATION:  finger-nose-finger, fine finger movements normal  REFLEXES:  deep tendon reflexes present and symmetric  GAIT/STATION:  normal   DIAGNOSTIC DATA (LABS, IMAGING, TESTING) - I reviewed patient records, labs, notes, testing and imaging myself where available.  Lab Results  Component Value Date   WBC 6.8 09/09/2022  HGB 13.0 09/09/2022   HCT 39.5 09/09/2022   MCV 87.3 09/09/2022   PLT 469.0 (H) 09/09/2022      Component Value Date/Time   NA 138 09/09/2022 1006   NA  145 (H) 09/02/2022 1529   NA 138 04/27/2017 0933   K 3.9 09/09/2022 1006   K 3.3 (L) 04/27/2017 0933   CL 100 09/09/2022 1006   CO2 31 09/09/2022 1006   CO2 26 04/27/2017 0933   GLUCOSE 88 09/09/2022 1006   GLUCOSE 85 04/27/2017 0933   BUN 16 09/09/2022 1006   BUN 16 09/02/2022 1529   BUN 12.2 04/27/2017 0933   CREATININE 0.63 09/09/2022 1006   CREATININE 0.72 06/17/2022 0952   CREATININE 0.7 04/27/2017 0933   CALCIUM 9.6 09/09/2022 1006   CALCIUM 9.1 04/27/2017 0933   PROT 7.4 09/09/2022 1006   PROT 7.5 04/27/2017 0933   ALBUMIN 4.3 09/09/2022 1006   ALBUMIN 4.0 04/27/2017 0933   AST 21 09/09/2022 1006   AST 23 06/17/2022 0952   AST 25 04/27/2017 0933   ALT 18 09/09/2022 1006   ALT 18 06/17/2022 0952   ALT 19 04/27/2017 0933   ALKPHOS 131 (H) 09/09/2022 1006   ALKPHOS 139 04/27/2017 0933   BILITOT 0.4 09/09/2022 1006   BILITOT 0.3 06/17/2022 0952   BILITOT 0.58 04/27/2017 0933   GFRNONAA >60 06/17/2022 0952   GFRAA >60 01/27/2020 0923   GFRAA >60 12/12/2019 1036   Lab Results  Component Value Date   CHOL 190 09/09/2022   HDL 60.10 09/09/2022   LDLCALC 113 (H) 09/09/2022   TRIG 84.0 09/09/2022   CHOLHDL 3 09/09/2022   Lab Results  Component Value Date   HGBA1C 5.9 09/09/2022   Lab Results  Component Value Date   VITAMINB12 271 09/09/2022   Lab Results  Component Value Date   TSH 1.51 09/09/2022      ASSESSMENT AND PLAN  57 y.o. year old female with hypertension, hyperlipidemia, heart disease, obstructive sleep apnea on CPAP, vitamin B12 deficiency who is presenting with complaint of word finding difficulty, memory loss and falls.  Even though she has been complaining of word finding difficulty and memory loss, her neurological examination was nonfocal.  She still independent in all activities of daily living.  Plan for now is to obtain MRI brain, to rule out intracranial abnormality, rule out stroke as a result of her heart disease. Her LDL was elevated, I  will start her on Statin. She was also found to have B12 deficiency, currently taking B12 shot. I have explained to the patient that B12 deficiency can contribute to her brain fog and memory loss.   When he comes to the falls, again she denies any prodrome prior to the fall.  Denies any trip and fall.  I have advised patient to contact me if she had additional falls, at that time plan will be to send her to physical therapy.  Continue to follow with PCP return in a year or sooner if worse. I will contact you to go over the MRI results.      1. Brain fog   2. Word finding difficulty   3. B12 deficiency   4. Fall, subsequent encounter      Patient Instructions  MRI brain without contrast to rule out structural abnormality Continue your other medications Continue with your B12 injection, consider also adding B complex multi vitamins Start Crestor 5 mg nightly Contact me if you have additional falls, at that time we  will refer you to physical therapy Follow-up in 1 year or sooner if worse.  Orders Placed This Encounter  Procedures   MR BRAIN W WO CONTRAST    Meds ordered this encounter  Medications   rosuvastatin (CRESTOR) 5 MG tablet    Sig: Take 1 tablet (5 mg total) by mouth at bedtime.    Dispense:  30 tablet    Refill:  11    Return in about 1 year (around 10/30/2023).  I have spent a total of 65 minutes dedicated to this patient today, preparing to see patient, performing a medically appropriate examination and evaluation, ordering tests and/or medications and procedures, and counseling and educating the patient/family/caregiver; independently interpreting result and communicating results to the family/patient/caregiver; and documenting clinical information in the electronic medical record.   Windell Norfolk, MD 10/30/2022, 5:14 PM  [facility] [address] [place], [place] [address] [phone]

## 2022-10-30 NOTE — Patient Instructions (Signed)
MRI brain without contrast to rule out structural abnormality Continue your other medications Continue with your B12 injection, consider also adding B complex multi vitamins Start Crestor 5 mg nightly Contact me if you have additional falls, at that time we will refer you to physical therapy Follow-up in 1 year or sooner if worse.

## 2022-11-07 ENCOUNTER — Encounter: Payer: Self-pay | Admitting: Neurology

## 2022-11-10 ENCOUNTER — Telehealth: Payer: Self-pay | Admitting: Neurology

## 2022-11-10 NOTE — Telephone Encounter (Signed)
Drucie Opitz: [id number] exp. 10/30/22-05/05/23 sent to [facility] for her pacemaker. [phone]

## 2022-11-18 ENCOUNTER — Other Ambulatory Visit: Payer: Self-pay

## 2022-11-18 ENCOUNTER — Telehealth: Payer: Self-pay | Admitting: Internal Medicine

## 2022-11-18 ENCOUNTER — Other Ambulatory Visit: Payer: Self-pay | Admitting: Internal Medicine

## 2022-11-18 MED ORDER — CELECOXIB 200 MG PO CAPS: 200.0000 mg | ORAL_CAPSULE | Freq: Every day | ORAL | 0 refills | Status: DC | PRN

## 2022-11-18 NOTE — Addendum Note (Signed)
Addended by: Kern Reap B on: 11/18/2022 09:58 AM   Modules accepted: Orders

## 2022-11-18 NOTE — Telephone Encounter (Signed)
Spoke with pt in regards to paperwork for her [facility] referral. Pt requested a refill of her Celebrex due to not having a provider at this time. Dr. Servando Salina agreed to a one time refill of the medication. Prescription sent to pharmacy.

## 2022-11-18 NOTE — Telephone Encounter (Signed)
Prescription Request  11/18/2022  LOV: 09/09/2022  What is the name of the medication or equipment?  celecoxib (CELEBREX) 200 MG capsule   Have you contacted your pharmacy to request a refill? No   Which pharmacy would you like this sent to?  [id number] [place], [place] - [address] [place] [address] Phone: [phone] Fax: [phone]    Patient notified that their request is being sent to the clinical staff for review and that they should receive a response within 2 business days.   Please advise at Mobile [phone] (mobile)  **pt states weight management referral was discussed at appointment 09/09/22. She has not heard from anyone

## 2022-11-28 DIAGNOSIS — G4733 Obstructive sleep apnea (adult) (pediatric): Secondary | ICD-10-CM | POA: Diagnosis not present

## 2022-12-10 ENCOUNTER — Ambulatory Visit (INDEPENDENT_AMBULATORY_CARE_PROVIDER_SITE_OTHER): Payer: 59

## 2022-12-10 DIAGNOSIS — I442 Atrioventricular block, complete: Secondary | ICD-10-CM | POA: Diagnosis not present

## 2022-12-11 ENCOUNTER — Other Ambulatory Visit: Payer: Self-pay | Admitting: *Deleted

## 2022-12-11 LAB — CUP PACEART REMOTE DEVICE CHECK
Battery Remaining Longevity: 174 mo
Battery Remaining Percentage: 100 %
Brady Statistic RA Percent Paced: 38 %
Brady Statistic RV Percent Paced: 0 %
Date Time Interrogation Session: 20240717045900
Implantable Lead Connection Status: 753985
Implantable Lead Connection Status: 753985
Implantable Lead Implant Date: 20230717
Implantable Lead Implant Date: 20230717
Implantable Lead Location: 753859
Implantable Lead Location: 753860
Implantable Lead Model: 7841
Implantable Lead Model: 7842
Implantable Lead Serial Number: 1204466
Implantable Lead Serial Number: 1298900
Implantable Pulse Generator Implant Date: 20230717
Lead Channel Impedance Value: 599 Ohm
Lead Channel Impedance Value: 755 Ohm
Lead Channel Pacing Threshold Amplitude: 0.7 V
Lead Channel Pacing Threshold Pulse Width: 0.4 ms
Lead Channel Setting Pacing Amplitude: 2.5 V
Lead Channel Setting Pacing Amplitude: 3.3 V
Lead Channel Setting Pacing Pulse Width: 1 ms
Lead Channel Setting Sensing Sensitivity: 1 mV
Pulse Gen Serial Number: 603605
Zone Setting Status: 755011

## 2022-12-11 MED ORDER — KLOR-CON M20 20 MEQ PO TBCR: 20.0000 meq | EXTENDED_RELEASE_TABLET | Freq: Every day | ORAL | 0 refills | Status: DC

## 2022-12-11 NOTE — Telephone Encounter (Signed)
Patient is requesting a refill of Klor- Con not prescribed by Dr Ardyth Harps.  Okay to refill?

## 2022-12-16 ENCOUNTER — Inpatient Hospital Stay (HOSPITAL_BASED_OUTPATIENT_CLINIC_OR_DEPARTMENT_OTHER): Payer: 59 | Admitting: Medical Oncology

## 2022-12-16 ENCOUNTER — Telehealth (INDEPENDENT_AMBULATORY_CARE_PROVIDER_SITE_OTHER): Payer: Self-pay | Admitting: Family Medicine

## 2022-12-16 ENCOUNTER — Inpatient Hospital Stay: Payer: 59 | Attending: Hematology & Oncology

## 2022-12-16 ENCOUNTER — Other Ambulatory Visit: Payer: Self-pay

## 2022-12-16 ENCOUNTER — Encounter: Payer: Self-pay | Admitting: Medical Oncology

## 2022-12-16 VITALS — BP 129/65 | HR 57 | Temp 98.5°F | Resp 18 | Ht 63.0 in | Wt 244.0 lb

## 2022-12-16 DIAGNOSIS — D508 Other iron deficiency anemias: Secondary | ICD-10-CM

## 2022-12-16 DIAGNOSIS — Z9884 Bariatric surgery status: Secondary | ICD-10-CM | POA: Diagnosis not present

## 2022-12-16 DIAGNOSIS — D75839 Thrombocytosis, unspecified: Secondary | ICD-10-CM | POA: Insufficient documentation

## 2022-12-16 DIAGNOSIS — K909 Intestinal malabsorption, unspecified: Secondary | ICD-10-CM | POA: Insufficient documentation

## 2022-12-16 DIAGNOSIS — Z807 Family history of other malignant neoplasms of lymphoid, hematopoietic and related tissues: Secondary | ICD-10-CM | POA: Diagnosis not present

## 2022-12-16 DIAGNOSIS — D509 Iron deficiency anemia, unspecified: Secondary | ICD-10-CM | POA: Diagnosis not present

## 2022-12-16 LAB — CMP (CANCER CENTER ONLY)
ALT: 19 U/L (ref 0–44)
AST: 23 U/L (ref 15–41)
Albumin: 4.5 g/dL (ref 3.5–5.0)
Alkaline Phosphatase: 129 U/L — ABNORMAL HIGH (ref 38–126)
Anion gap: 9 (ref 5–15)
BUN: 13 mg/dL (ref 6–20)
CO2: 29 mmol/L (ref 22–32)
Calcium: 9.3 mg/dL (ref 8.9–10.3)
Chloride: 100 mmol/L (ref 98–111)
Creatinine: 0.64 mg/dL (ref 0.44–1.00)
GFR, Estimated: >60 mL/min (ref >60)
Glucose, Bld: 105 mg/dL — ABNORMAL HIGH (ref 70–99)
Potassium: 3.6 mmol/L (ref 3.5–5.1)
Sodium: 138 mmol/L (ref 135–145)
Total Bilirubin: 0.4 mg/dL (ref 0.3–1.2)
Total Protein: 7.5 g/dL (ref 6.5–8.1)

## 2022-12-16 LAB — CBC WITH DIFFERENTIAL (CANCER CENTER ONLY)
Abs Immature Granulocytes: 0.03 10*3/uL (ref 0.00–0.07)
Basophils Absolute: 0.1 10*3/uL (ref 0.0–0.1)
Basophils Relative: 1 %
Eosinophils Absolute: 0.1 10*3/uL (ref 0.0–0.5)
Eosinophils Relative: 1 %
HCT: 41 % (ref 36.0–46.0)
Hemoglobin: 13.4 g/dL (ref 12.0–15.0)
Immature Granulocytes: 0 %
Lymphocytes Relative: 45 %
Lymphs Abs: 3.5 10*3/uL (ref 0.7–4.0)
MCH: 28.6 pg (ref 26.0–34.0)
MCHC: 32.7 g/dL (ref 30.0–36.0)
MCV: 87.4 fL (ref 80.0–100.0)
Monocytes Absolute: 0.4 10*3/uL (ref 0.1–1.0)
Monocytes Relative: 6 %
Neutro Abs: 3.6 10*3/uL (ref 1.7–7.7)
Neutrophils Relative %: 47 %
Platelet Count: 470 10*3/uL — ABNORMAL HIGH (ref 150–400)
RBC: 4.69 MIL/uL (ref 3.87–5.11)
RDW: 13.6 % (ref 11.5–15.5)
WBC Count: 7.7 10*3/uL (ref 4.0–10.5)
nRBC: 0 % (ref 0.0–0.2)

## 2022-12-16 LAB — FERRITIN: Ferritin: 17 ng/mL (ref 11–307)

## 2022-12-16 LAB — LACTATE DEHYDROGENASE: LDH: 212 U/L — ABNORMAL HIGH (ref 98–192)

## 2022-12-16 LAB — IRON AND IRON BINDING CAPACITY (CC-WL,HP ONLY)
Iron: 115 ug/dL (ref 28–170)
Saturation Ratios: 32 % — ABNORMAL HIGH (ref 10.4–31.8)
TIBC: 363 ug/dL (ref 250–450)
UIBC: 248 ug/dL (ref 148–442)

## 2022-12-16 LAB — RETICULOCYTES
Immature Retic Fract: 10.6 % (ref 2.3–15.9)
RBC.: 4.72 MIL/uL (ref 3.87–5.11)
Retic Count, Absolute: 82.1 10*3/uL (ref 19.0–186.0)
Retic Ct Pct: 1.7 % (ref 0.4–3.1)

## 2022-12-16 LAB — SAVE SMEAR(SSMR), FOR PROVIDER SLIDE REVIEW

## 2022-12-16 NOTE — Progress Notes (Signed)
Hematology and Oncology Follow Up Visit  [name] [id number] Mar 18, 1966 57 y.o. 12/16/2022   Principle Diagnosis:  Thrombocytosis  - JAK2 (-)/ DNMT3A (+) Family history of lymphoma (father) Iron deficiency anemia secondary to malabsorption with bariatric surgery (2012)    Current Therapy:        Observation IV iron as indicated -- Feraheme given on 04/27/2018   Interim History:  [name] is here today for follow-up.  We saw her 6 months ago.     History of negative JAK2 assay, However, there was found to be a DNMT3A mutation.    She reports that overall she has been doing ok. She is on the wait list to be seen by [facility] for her connective tissue disease.   She has had no problems with nausea or vomiting.  She has had no problems with bleeding.  There is no bruising.  She has had no cough or shortness of breath.  There is been no change in bowel or bladder habits.  Overall, I would have to say that her performance status is probably ECOG 0.    Wt Readings from Last 3 Encounters:  12/16/22 244 lb (110.7 kg)  10/30/22 245 lb (111.1 kg)  10/09/22 245 lb 12.8 oz (111.5 kg)     Medications:  Allergies as of 12/16/2022       Reactions   Penicillins Other (See Comments)   Causes severe, difficult to treat yeast infections        Medication List        Accurate as of December 16, 2022 10:03 AM. If you have any questions, ask your nurse or doctor.          acetaminophen 325 MG tablet Commonly known as: TYLENOL Take 1-2 tablets (325-650 mg total) by mouth every 4 (four) hours as needed for mild pain.   BD SafetyGlide Syringe/Needle 25G X 1" 3 ML Misc Generic drug: SYRINGE-NEEDLE (DISP) 3 ML Use for B12 injections   BIOFREEZE EX Apply 1 application  topically daily as needed (On Knees and feet).   Biotin 1000 MCG tablet Take 1,000 mcg by mouth every other day.   celecoxib 200 MG capsule Commonly known as: CELEBREX Take 1 capsule (200 mg  total) by mouth daily as needed for mild pain or moderate pain.   cetirizine 10 MG tablet Commonly known as: ZYRTEC Take 10 mg by mouth daily.   COLLAGEN PO Take by mouth. Liquid collagen with biotin   cyanocobalamin 1000 MCG/ML injection Commonly known as: VITAMIN B12 Inject 1 ml into the muscle once a week for a month.  Then inject 1 ml into the muscle once a month thereafter.   famotidine 40 MG tablet Commonly known as: PEPCID Take 40 mg by mouth 2 (two) times daily.   fluticasone 50 MCG/ACT nasal spray Commonly known as: FLONASE Place 2 sprays into both nostrils daily. What changed:  when to take this reasons to take this   GLUCOSAMINE PO Take 1,000 mg by mouth daily.   hydrochlorothiazide 25 MG tablet Commonly known as: HYDRODIURIL Take 1 tablet (25 mg total) by mouth daily.   Ibuprofen 200 MG Caps Take 400 mg by mouth daily as needed (For pain).   Klor-Con M20 20 MEQ tablet Generic drug: potassium chloride SA Take 1 tablet (20 mEq total) by mouth daily.   levothyroxine 88 MCG tablet Commonly known as: SYNTHROID Take 1 tablet (88 mcg total) by mouth daily.   loratadine 10 MG tablet Commonly  known as: CLARITIN TAKE 1 TABLET BY MOUTH EVERY DAY   Magnesium 250 MG Tabs Take 250 mg by mouth daily. For leg cramps   metoprolol succinate 25 MG 24 hr tablet Commonly known as: Toprol XL Take 0.5 tablets (12.5 mg total) by mouth 2 (two) times daily.   rosuvastatin 5 MG tablet Commonly known as: Crestor Take 1 tablet (5 mg total) by mouth at bedtime.   Turmeric 500 MG Caps Take 500 mg by mouth as needed.   valsartan 160 MG tablet Commonly known as: DIOVAN Take 1 tablet (160 mg total) by mouth daily.   Vitamin D3 50 MCG (2000 UT) Tabs Take 2,000 Units by mouth daily.   YumVs Beet Root-Tart Cherry 250-0.5 MG Chew Chew 1 tablet by mouth 2 (two) times a week.        Allergies:  Allergies  Allergen Reactions   Penicillins Other (See Comments)     Causes severe, difficult to treat yeast infections    Past Medical History, Surgical history, Social history, and Family History were reviewed and updated.  Review of Systems: Review of Systems  Constitutional: Negative.   HENT: Negative.    Eyes: Negative.   Respiratory: Negative.    Cardiovascular: Negative.   Gastrointestinal: Negative.   Genitourinary: Negative.   Musculoskeletal: Negative.   Skin: Negative.   Neurological: Negative.   Endo/Heme/Allergies: Negative.   Psychiatric/Behavioral: Negative.       Physical Exam:  height is 5\' 3"  (1.6 m) and weight is 244 lb (110.7 kg). Her oral temperature is 98.5 F (36.9 C). Her blood pressure is 129/65 and her pulse is 57 (abnormal). Her respiration is 18 and oxygen saturation is 100%.   Wt Readings from Last 3 Encounters:  12/16/22 244 lb (110.7 kg)  10/30/22 245 lb (111.1 kg)  10/09/22 245 lb 12.8 oz (111.5 kg)    Physical Exam Vitals reviewed.  HENT:     Head: Normocephalic and atraumatic.  Eyes:     Pupils: Pupils are equal, round, and reactive to light.  Cardiovascular:     Rate and Rhythm: Normal rate and regular rhythm.     Heart sounds: Normal heart sounds.  Pulmonary:     Effort: Pulmonary effort is normal.     Breath sounds: Normal breath sounds.  Abdominal:     General: Bowel sounds are normal.     Palpations: Abdomen is soft.  Musculoskeletal:        General: No tenderness or deformity. Normal range of motion.     Cervical back: Normal range of motion.  Lymphadenopathy:     Cervical: No cervical adenopathy.  Skin:    General: Skin is warm and dry.     Findings: No erythema or rash.  Neurological:     Mental Status: She is alert and oriented to person, place, and time.  Psychiatric:        Behavior: Behavior normal.        Thought Content: Thought content normal.        Judgment: Judgment normal.     Lab Results  Component Value Date   WBC 7.7 12/16/2022   HGB 13.4 12/16/2022   HCT 41.0  12/16/2022   MCV 87.4 12/16/2022   PLT 470 (H) 12/16/2022   Lab Results  Component Value Date   FERRITIN 27 06/17/2022   IRON 91 06/17/2022   TIBC 353 06/17/2022   UIBC 262 06/17/2022   IRONPCTSAT 26 06/17/2022   Lab Results  Component Value Date  RETICCTPCT 1.9 06/17/2022   RBC 4.69 12/16/2022   RETICCTABS 79.2 04/19/2014   No results found for: "KPAFRELGTCHN", "LAMBDASER", "KAPLAMBRATIO" No results found for: "IGGSERUM", "IGA", "IGMSERUM" No results found for: "TOTALPROTELP", "ALBUMINELP", "A1GS", "A2GS", "BETS", "BETA2SER", "GAMS", "MSPIKE", "SPEI"   Chemistry      Component Value Date/Time   NA 138 09/09/2022 1006   NA 145 (H) 09/02/2022 1529   NA 138 04/27/2017 0933   K 3.9 09/09/2022 1006   K 3.3 (L) 04/27/2017 0933   CL 100 09/09/2022 1006   CO2 31 09/09/2022 1006   CO2 26 04/27/2017 0933   BUN 16 09/09/2022 1006   BUN 16 09/02/2022 1529   BUN 12.2 04/27/2017 0933   CREATININE 0.63 09/09/2022 1006   CREATININE 0.72 06/17/2022 0952   CREATININE 0.7 04/27/2017 0933   GLU 81 08/29/2019 0000      Component Value Date/Time   CALCIUM 9.6 09/09/2022 1006   CALCIUM 9.1 04/27/2017 0933   ALKPHOS 131 (H) 09/09/2022 1006   ALKPHOS 139 04/27/2017 0933   AST 21 09/09/2022 1006   AST 23 06/17/2022 0952   AST 25 04/27/2017 0933   ALT 18 09/09/2022 1006   ALT 18 06/17/2022 0952   ALT 19 04/27/2017 0933   BILITOT 0.4 09/09/2022 1006   BILITOT 0.3 06/17/2022 0952   BILITOT 0.58 04/27/2017 0933      Impression and Plan: Ms. Blea is a very pleasant 57 yo African American female with history of mild thrombocytosis as well as intermittent iron deficiency anemia secondary to malabsorption. We will have to monitor her blood counts.  It is hard to say if this DNMT3A mutation is going to be a problem for [place].  Today she is doing ok. CBC/CMP are stable. Iron studied pending RTC 6 months APP, labs (CBC w., CMP, iron ,ferritin, LDH, save smear, reticulocytes)  Rushie Chestnut, PA-C 7/23/202410:03 AM

## 2022-12-16 NOTE — Telephone Encounter (Signed)
7/23 called patient left voicemail to reschedule appointment due to our office closing that day.

## 2022-12-17 ENCOUNTER — Encounter: Payer: Self-pay | Admitting: Hematology & Oncology

## 2022-12-24 ENCOUNTER — Encounter (INDEPENDENT_AMBULATORY_CARE_PROVIDER_SITE_OTHER): Payer: 59 | Admitting: Family Medicine

## 2022-12-24 DIAGNOSIS — M4802 Spinal stenosis, cervical region: Secondary | ICD-10-CM | POA: Diagnosis not present

## 2022-12-24 DIAGNOSIS — M5412 Radiculopathy, cervical region: Secondary | ICD-10-CM | POA: Diagnosis not present

## 2022-12-24 DIAGNOSIS — M542 Cervicalgia: Secondary | ICD-10-CM | POA: Diagnosis not present

## 2022-12-24 DIAGNOSIS — G959 Disease of spinal cord, unspecified: Secondary | ICD-10-CM | POA: Diagnosis not present

## 2022-12-25 ENCOUNTER — Encounter (INDEPENDENT_AMBULATORY_CARE_PROVIDER_SITE_OTHER): Payer: 59 | Admitting: Family Medicine

## 2022-12-25 NOTE — Progress Notes (Signed)
Remote pacemaker transmission.   

## 2022-12-29 DIAGNOSIS — G4733 Obstructive sleep apnea (adult) (pediatric): Secondary | ICD-10-CM | POA: Diagnosis not present

## 2023-01-14 ENCOUNTER — Other Ambulatory Visit: Payer: Self-pay | Admitting: Cardiology

## 2023-01-14 ENCOUNTER — Other Ambulatory Visit: Payer: Self-pay | Admitting: Internal Medicine

## 2023-01-14 ENCOUNTER — Other Ambulatory Visit: Payer: Self-pay | Admitting: Orthopaedic Surgery

## 2023-01-14 DIAGNOSIS — M542 Cervicalgia: Secondary | ICD-10-CM

## 2023-01-14 DIAGNOSIS — M5412 Radiculopathy, cervical region: Secondary | ICD-10-CM

## 2023-01-14 DIAGNOSIS — G959 Disease of spinal cord, unspecified: Secondary | ICD-10-CM

## 2023-01-14 DIAGNOSIS — M4802 Spinal stenosis, cervical region: Secondary | ICD-10-CM

## 2023-01-23 ENCOUNTER — Encounter: Payer: Self-pay | Admitting: Family

## 2023-01-27 ENCOUNTER — Ambulatory Visit (HOSPITAL_COMMUNITY)
Admission: RE | Admit: 2023-01-27 | Discharge: 2023-01-27 | Disposition: A | Discharge status: Home / Self Care | Payer: 59 | Source: Ambulatory Visit | Attending: Neurology | Admitting: Neurology

## 2023-01-27 DIAGNOSIS — R4189 Other symptoms and signs involving cognitive functions and awareness: Secondary | ICD-10-CM | POA: Insufficient documentation

## 2023-01-27 DIAGNOSIS — R9082 White matter disease, unspecified: Secondary | ICD-10-CM | POA: Diagnosis not present

## 2023-01-27 DIAGNOSIS — R4789 Other speech disturbances: Secondary | ICD-10-CM | POA: Diagnosis not present

## 2023-01-27 DIAGNOSIS — R413 Other amnesia: Secondary | ICD-10-CM | POA: Diagnosis not present

## 2023-01-27 MED ORDER — GADOBUTROL 1 MMOL/ML IV SOLN
10.0000 mL | Freq: Once | INTRAVENOUS | Status: AC | PRN
  Administered 2023-01-27: 10 mL via INTRAVENOUS

## 2023-01-28 ENCOUNTER — Encounter: Payer: Self-pay | Admitting: Neurology

## 2023-01-28 NOTE — Telephone Encounter (Signed)
Spoke with patient, discussed MRI results. Thanks

## 2023-01-29 DIAGNOSIS — G4733 Obstructive sleep apnea (adult) (pediatric): Secondary | ICD-10-CM | POA: Diagnosis not present

## 2023-02-09 ENCOUNTER — Encounter: Payer: Self-pay | Admitting: Neurology

## 2023-02-10 NOTE — Telephone Encounter (Signed)
I spoke with the patient and we discussed the MRI results on 9/4. Thanks

## 2023-02-16 ENCOUNTER — Encounter: Payer: Self-pay | Admitting: Cardiology

## 2023-02-16 ENCOUNTER — Ambulatory Visit: Payer: 59 | Attending: Cardiology | Admitting: Cardiology

## 2023-02-16 VITALS — BP 126/82 | HR 60 | Ht 63.0 in | Wt 248.6 lb

## 2023-02-16 DIAGNOSIS — E669 Obesity, unspecified: Secondary | ICD-10-CM

## 2023-02-16 DIAGNOSIS — I10 Essential (primary) hypertension: Secondary | ICD-10-CM

## 2023-02-16 DIAGNOSIS — G4733 Obstructive sleep apnea (adult) (pediatric): Secondary | ICD-10-CM

## 2023-02-16 DIAGNOSIS — Z950 Presence of cardiac pacemaker: Secondary | ICD-10-CM | POA: Diagnosis not present

## 2023-02-16 DIAGNOSIS — I442 Atrioventricular block, complete: Secondary | ICD-10-CM

## 2023-02-16 DIAGNOSIS — I480 Paroxysmal atrial fibrillation: Secondary | ICD-10-CM | POA: Diagnosis not present

## 2023-02-16 DIAGNOSIS — I493 Ventricular premature depolarization: Secondary | ICD-10-CM | POA: Diagnosis not present

## 2023-02-16 MED ORDER — FUROSEMIDE 20 MG PO TABS: 20.0000 mg | ORAL_TABLET | Freq: Every day | ORAL | 0 refills | Status: DC

## 2023-02-16 MED ORDER — METOPROLOL SUCCINATE ER 25 MG PO TB24: 25.0000 mg | ORAL_TABLET | Freq: Two times a day (BID) | ORAL | 3 refills | Status: DC

## 2023-02-16 NOTE — Patient Instructions (Addendum)
Medication Instructions:  Your physician has recommended you make the following change in your medication:  CHANGE: Toprol-XL (metoprolol succinate) 25 mg twice daily For 3 days: Lasix 20 mg once daily *If you need a refill on your cardiac medications before your next appointment, please call your pharmacy*   Lab Work: None   Testing/Procedures: None   Follow-Up: At [facility], you and your health needs are our priority.  As part of our continuing mission to provide you with exceptional heart care, we have created designated Provider Care Teams.  These Care Teams include your primary Cardiologist (physician) and Advanced Practice Providers (APPs -  Physician Assistants and Nurse Practitioners) who all work together to provide you with the care you need, when you need it.  Your next appointment:   9 month(s)  Provider:   Thomasene Ripple, DO

## 2023-02-16 NOTE — Progress Notes (Signed)
Cardiology Office Note:    Date:  02/16/2023   ID:  [name], DOB 09-06-1965, MRN [id number]  PCP:  Philip Aspen, Limmie Patricia, MD  Cardiologist:  Thomasene Ripple, DO  Electrophysiologist:  None   Referring MD: Philip Aspen, Estel*   " I am ok"  History of Present Illness:    [name] is a 57 y.o. female with a hx of mixed connective tissue disorder, intermittent AV block who underwent PPM insertion complicated by lead migration and asymptomatic perforation-she underwent insertion of a new lead, PAF which was recently found on her ppm interrogation, COPD, mild thrombocytosis, iron deficiency anemia, secondary to absorption after bariatric surgery in 2012, concern for mixed connective tissue disorder, sleep apnea no longer on CPAP, hypertension, irritable bowel syndrome and obesity.   At her last visit with me I referred the patient to neurology and rhematology at [facility]. She did see neuro who recommended a MRI of the brain. She recently had an MRI of the brain, which revealed small vessel disease and a partially empty sella. Management plan per neurology.  She  is scheduled to see a [facility] in  July 2025.  She report intermittent palpitations. The palpitations are described as long and hard, causing her to feel winded and requiring her to stop what she is doing. She has noticed an increase in the duration of the palpitations, which can occur at any time of the day. She is currently taking metoprolol once a day.  The patient also reports feeling swollen and has noticed an increase in her weight. She has swelling in her ankle and knee, which has limited her ability to walk. She also reports tightness in her fingers upon waking up.    Past Medical History:  Diagnosis Date   Allergic rhinitis, cause unspecified    Allergy 2016   Anemia, iron deficiency    Arthritis    COVID-19 06/15/2020   Diastolic dysfunction    Diverticulosis of colon    GERD  (gastroesophageal reflux disease)    Goiter    Hiatal hernia    Hypertension    IBS (irritable bowel syndrome)    Morbid obesity (HCC)    Obstructive sleep apnea    Sleep apnea    Thrombocytosis    Undiagnosed cardiac murmurs     Past Surgical History:  Procedure Laterality Date   BARIATRIC SURGERY  06/17/2010   BILATERAL SALPINGECTOMY Bilateral 06/22/2014   Procedure: BILATERAL SALPINGECTOMY;  Surgeon: Jaymes Graff, MD;  Location: [facility];  Service: Gynecology;  Laterality: Bilateral;   CYSTOSCOPY N/A 06/22/2014   Procedure: CYSTOSCOPY;  Surgeon: Jaymes Graff, MD;  Location: [facility];  Service: Gynecology;  Laterality: N/A;   ENDOMETRIAL ABLATION  04/2007   FOOT SURGERY Left 11/24/2017   Dr Charlsie Merles   LAPAROSCOPIC ASSISTED VAGINAL HYSTERECTOMY N/A 06/22/2014   Procedure: LAPAROSCOPIC ASSISTED VAGINAL HYSTERECTOMY;  Surgeon: Jaymes Graff, MD;  Location: [facility];  Service: Gynecology;  Laterality: N/A;   LEAD REVISION/REPAIR N/A 12/17/2021   Procedure: LEAD REVISION/REPAIR;  Surgeon: Marinus Maw, MD;  [facility];  Service: Cardiovascular;  Laterality: N/A;   LIPOMA EXCISION     upper back   PACEMAKER IMPLANT N/A 12/09/2021   Procedure: PACEMAKER IMPLANT;  Surgeon: Marinus Maw, MD;  [facility]: [facility];  Service: Cardiovascular;  Laterality: N/A;   SPINE SURGERY  02/10/2012   L4-l5 fusion--cohen    Current Medications: Current Meds  Medication Sig   acetaminophen (TYLENOL)  325 MG tablet Take 1-2 tablets (325-650 mg total) by mouth every 4 (four) hours as needed for mild pain.   Biotin 1000 MCG tablet Take 1,000 mcg by mouth every other day.   celecoxib (CELEBREX) 200 MG capsule Take 1 capsule (200 mg total) by mouth daily as needed for mild pain or moderate pain.   cetirizine (ZYRTEC) 10 MG tablet Take 10 mg by mouth daily.   Cholecalciferol (VITAMIN D3) 50 MCG (2000 UT) TABS Take 2,000 Units by mouth daily.   COLLAGEN PO Take by mouth. Liquid  collagen with biotin   cyanocobalamin (VITAMIN B12) 1000 MCG/ML injection Inject 1 ml into the muscle once a week for a month.  Then inject 1 ml into the muscle once a month thereafter.   famotidine (PEPCID) 40 MG tablet Take 40 mg by mouth 2 (two) times daily.   fluticasone (FLONASE) 50 MCG/ACT nasal spray Place 2 sprays into both nostrils daily. (Patient taking differently: Place 2 sprays into both nostrils daily as needed for allergies.)   furosemide (LASIX) 20 MG tablet Take 1 tablet (20 mg total) by mouth daily for 7 days. For 3 days   Glucosamine HCl (GLUCOSAMINE PO) Take 1,000 mg by mouth daily.   hydrochlorothiazide (HYDRODIURIL) 25 MG tablet Take 1 tablet (25 mg total) by mouth daily.   Ibuprofen 200 MG CAPS Take 400 mg by mouth daily as needed (For pain).   KLOR-CON M20 20 MEQ tablet TAKE 1 TABLET BY MOUTH EVERY DAY   levothyroxine (SYNTHROID) 88 MCG tablet Take 1 tablet (88 mcg total) by mouth daily.   loratadine (CLARITIN) 10 MG tablet TAKE 1 TABLET BY MOUTH EVERY DAY   Magnesium 250 MG TABS Take 250 mg by mouth daily. For leg cramps   Menthol, Topical Analgesic, (BIOFREEZE EX) Apply 1 application  topically daily as needed (On Knees and feet).   Misc Natural Products (YUMVS BEET ROOT-TART CHERRY) 250-0.5 MG CHEW Chew 1 tablet by mouth 2 (two) times a week.   rosuvastatin (CRESTOR) 5 MG tablet Take 1 tablet (5 mg total) by mouth at bedtime.   SYRINGE-NEEDLE, DISP, 3 ML (BD SAFETYGLIDE SYRINGE/NEEDLE) 25G X 1" 3 ML MISC Use for B12 injections   Turmeric 500 MG CAPS Take 500 mg by mouth as needed.   valsartan (DIOVAN) 160 MG tablet Take 1 tablet (160 mg total) by mouth daily.   [DISCONTINUED] metoprolol succinate (TOPROL XL) 25 MG 24 hr tablet Take 0.5 tablets (12.5 mg total) by mouth 2 (two) times daily.     Allergies:   Penicillins   Social History   Socioeconomic History   Marital status: Married    Spouse name: Earl Lites   Number of children: 3   Years of education: Some  college   Highest education level: Not on file  Occupational History   Occupation: Herbalist: UNEMPLOYED  Tobacco Use   Smoking status: Never    Passive exposure: Never   Smokeless tobacco: Never   Tobacco comments:    Never Used Tobacco  Vaping Use   Vaping status: Never Used  Substance and Sexual Activity   Alcohol use: No   Drug use: No   Sexual activity: Yes    Partners: Male    Birth control/protection: Surgical    Comment: Hysterectomy  Other Topics Concern   Not on file  Social History Narrative   09/21/19   From: [place], [place] originally   Living: with husband, Earl Lites and 2 daughters w/ family  Work: homemaker      Family: 3 children - [place], [place], and [facility]. Has 8 grandchildren and one on the way      Enjoys: spend time with family, shopping      Exercise: trying to walk more   Diet: eats late at night, will go most of the day w/o eating      Safety   Seat belts: Yes    Guns: Yes  and secure   Safe in relationships: Yes    Social Determinants of Health   Financial Resource Strain: Not on file  Food Insecurity: Not on file  Transportation Needs: Not on file  Physical Activity: Not on file  Stress: Not on file  Social Connections: Not on file     Family History: The patient's family history includes Arrhythmia in her mother; Arthritis in her mother; Breast cancer (age of onset: 62) in her maternal aunt; Breast cancer (age of onset: 58) in her mother; Cancer in her father; Colon polyps in her father; Diabetes in her mother; Healthy in her daughter, daughter, and daughter; Heart disease in her mother; Hyperlipidemia in her mother; Hypertension in her father and mother; Kidney cancer in her father; Lymphoma in her father; Ovarian cancer in her paternal grandmother; Stroke (age of onset: 18) in her maternal grandmother. There is no history of Colon cancer.  ROS:   Review of Systems  Constitution: Negative for decreased appetite, fever and  weight gain.  HENT: Negative for congestion, ear discharge, hoarse voice and sore throat.   Eyes: Negative for discharge, redness, vision loss in right eye and visual halos.  Cardiovascular: Reports palpitations. Negative for chest pain, dyspnea on exertion, leg swelling, orthopnea  Respiratory: Negative for cough, hemoptysis, shortness of breath and snoring.   Endocrine: Negative for heat intolerance and polyphagia.  Hematologic/Lymphatic: Negative for bleeding problem. Does not bruise/bleed easily.  Skin: Negative for flushing, nail changes, rash and suspicious lesions.  Musculoskeletal: Negative for arthritis, joint pain, muscle cramps, myalgias, neck pain and stiffness.  Gastrointestinal: Negative for abdominal pain, bowel incontinence, diarrhea and excessive appetite.  Genitourinary: Negative for decreased libido, genital sores and incomplete emptying.  Neurological: Negative for brief paralysis, focal weakness, headaches and loss of balance.  Psychiatric/Behavioral: Negative for altered mental status, depression and suicidal ideas.  Allergic/Immunologic: Negative for HIV exposure and persistent infections.    EKGs/Labs/Other Studies Reviewed:    The following studies were reviewed today:   EKG:  The ekg ordered today demonstrates paced rhythm., Hr   bpm Recent Labs: 09/02/2022: Magnesium 2.0 09/09/2022: TSH 1.51 12/16/2022: ALT 19; BUN 13; Creatinine 0.64; Hemoglobin 13.4; Platelet Count 470; Potassium 3.6; Sodium 138  Recent Lipid Panel    Component Value Date/Time   CHOL 190 09/09/2022 1006   TRIG 84.0 09/09/2022 1006   HDL 60.10 09/09/2022 1006   CHOLHDL 3 09/09/2022 1006   VLDL 16.8 09/09/2022 1006   LDLCALC 113 (H) 09/09/2022 1006    Physical Exam:    VS:  BP 126/82   Pulse 60   Ht 5\' 3"  (1.6 m)   Wt 248 lb 9.6 oz (112.8 kg)   LMP 01/20/2011   SpO2 98%   BMI 44.04 kg/m     Wt Readings from Last 3 Encounters:  02/16/23 248 lb 9.6 oz (112.8 kg)  12/16/22 244 lb  (110.7 kg)  10/30/22 245 lb (111.1 kg)     GEN: Well nourished, well developed in no acute distress HEENT: Normal NECK: No JVD; No carotid bruits  LYMPHATICS: No lymphadenopathy CARDIAC: S1S2 noted,RRR, no murmurs, rubs, gallops RESPIRATORY:  Clear to auscultation without rales, wheezing or rhonchi  ABDOMEN: Soft, non-tender, non-distended, +bowel sounds, no guarding. EXTREMITIES: No edema, No cyanosis, no clubbing MUSCULOSKELETAL:  No deformity  SKIN: Warm and dry NEUROLOGIC:  Alert and oriented x 3, non-focal PSYCHIATRIC:  Normal affect, good insight  ASSESSMENT:    1. Essential hypertension   2. Atrial fibrillation, unspecified type (HCC)   3. Transient complete heart block (HCC)   4. Obstructive sleep apnea   5. MORBID OBESITY   6. Obesity (BMI 30-39.9)   7. Pacemaker    PLAN:    Premature Ventricular Contractions (PVCs) Increased frequency and duration of PVCs, causing shortness of breath and occasional chest discomfort. Currently on Metoprolol 12.5mg  once daily. -Increase Metoprolol to 25 mg twice daily. -Check in via phone to assess response to increased Metoprolol dose. -Consider switching to Cardizem if no improvement after 3-6 months.  Cerebral Small Vessel Disease Recent MRI showed small vessel disease and partial empty sella. Experiencing intermittent headaches of mild to moderate intensity. -Continue monitoring headaches for six weeks as advised by Dr. Teresa Coombs. -Prepare for lumbar puncture if headaches persist, managed by Dr. Teresa Coombs - neurologist.  Edema Complaints of swelling in the legs and fingers, with associated discomfort in the knee and ankle. -Start Lasix for three days to reduce fluid retention. -Check response to Lasix and consider intermittent dosing (once every two weeks) if effective.   General Health Maintenance / Followup Plans -Continue monitoring for [facility] appointment.  The patient is in agreement with the above plan. The patient left the  office in stable condition.  The patient will follow up in 9 months or sooner if needed.   Medication Adjustments/Labs and Tests Ordered: Current medicines are reviewed at length with the patient today.  Concerns regarding medicines are outlined above.  Orders Placed This Encounter  Procedures   EKG 12-Lead   Meds ordered this encounter  Medications   metoprolol succinate (TOPROL XL) 25 MG 24 hr tablet    Sig: Take 1 tablet (25 mg total) by mouth 2 (two) times daily.    Dispense:  180 tablet    Refill:  3   furosemide (LASIX) 20 MG tablet    Sig: Take 1 tablet (20 mg total) by mouth daily for 7 days. For 3 days    Dispense:  3 tablet    Refill:  0    Patient Instructions  Medication Instructions:  Your physician has recommended you make the following change in your medication:  CHANGE: Toprol-XL (metoprolol succinate) 25 mg twice daily For 3 days: Lasix 20 mg once daily *If you need a refill on your cardiac medications before your next appointment, please call your pharmacy*   Lab Work: None   Testing/Procedures: None   Follow-Up: At [facility], you and your health needs are our priority.  As part of our continuing mission to provide you with exceptional heart care, we have created designated Provider Care Teams.  These Care Teams include your primary Cardiologist (physician) and Advanced Practice Providers (APPs -  Physician Assistants and Nurse Practitioners) who all work together to provide you with the care you need, when you need it.  Your next appointment:   9 month(s)  Provider:   Thomasene Ripple, DO    Adopting a Healthy Lifestyle.  Know what a healthy weight is for you (roughly BMI <25) and aim to maintain this   Aim for 7+ servings  of fruits and vegetables daily   65-80+ fluid ounces of water or unsweet tea for healthy kidneys   Limit to max 1 drink of alcohol per day; avoid smoking/tobacco   Limit animal fats in diet for cholesterol and heart  health - choose grass fed whenever available   Avoid highly processed foods, and foods high in saturated/trans fats   Aim for low stress - take time to unwind and care for your mental health   Aim for 150 min of moderate intensity exercise weekly for heart health, and weights twice weekly for bone health   Aim for 7-9 hours of sleep daily   When it comes to diets, agreement about the perfect plan isnt easy to find, even among the experts. Experts at the [facility] [employer] developed an idea known as the Healthy Eating Plate. Just imagine a plate divided into logical, healthy portions.   The emphasis is on diet quality:   Load up on vegetables and fruits - one-half of your plate: Aim for color and variety, and remember that potatoes dont count.   Go for whole grains - one-quarter of your plate: Whole wheat, barley, wheat berries, quinoa, oats, brown rice, and foods made with them. If you want pasta, go with whole wheat pasta.   Protein power - one-quarter of your plate: Fish, chicken, beans, and nuts are all healthy, versatile protein sources. Limit red meat.   The diet, however, does go beyond the plate, offering a few other suggestions.   Use healthy plant oils, such as olive, canola, soy, corn, sunflower and peanut. Check the labels, and avoid partially hydrogenated oil, which have unhealthy trans fats.   If youre thirsty, drink water. Coffee and tea are good in moderation, but skip sugary drinks and limit milk and dairy products to one or two daily servings.   The type of carbohydrate in the diet is more important than the amount. Some sources of carbohydrates, such as vegetables, fruits, whole grains, and beans-are healthier than others.   Finally, stay active  Osvaldo Shipper, DO  02/16/2023 3:43 PM    [facility]

## 2023-02-17 ENCOUNTER — Other Ambulatory Visit: Payer: Self-pay | Admitting: Internal Medicine

## 2023-02-17 DIAGNOSIS — E039 Hypothyroidism, unspecified: Secondary | ICD-10-CM

## 2023-02-17 DIAGNOSIS — I10 Essential (primary) hypertension: Secondary | ICD-10-CM

## 2023-02-25 ENCOUNTER — Encounter: Payer: Self-pay | Admitting: Cardiology

## 2023-02-25 MED ORDER — FUROSEMIDE 40 MG PO TABS: ORAL_TABLET | ORAL | 0 refills | Status: DC

## 2023-02-25 MED ORDER — KLOR-CON M20 20 MEQ PO TBCR: EXTENDED_RELEASE_TABLET | ORAL | 0 refills | Status: DC

## 2023-03-02 ENCOUNTER — Encounter: Payer: Self-pay | Admitting: Neurology

## 2023-03-02 NOTE — Telephone Encounter (Signed)
Lets make an appointment to discuss these new issues.

## 2023-03-11 ENCOUNTER — Ambulatory Visit: Payer: 59

## 2023-03-16 ENCOUNTER — Ambulatory Visit: Payer: 59 | Admitting: Internal Medicine

## 2023-03-16 ENCOUNTER — Encounter: Payer: Self-pay | Admitting: Internal Medicine

## 2023-03-16 VITALS — BP 120/80 | HR 60 | Temp 98.0°F | Wt 247.7 lb

## 2023-03-16 DIAGNOSIS — M25562 Pain in left knee: Secondary | ICD-10-CM

## 2023-03-16 DIAGNOSIS — I10 Essential (primary) hypertension: Secondary | ICD-10-CM | POA: Diagnosis not present

## 2023-03-16 DIAGNOSIS — G8929 Other chronic pain: Secondary | ICD-10-CM

## 2023-03-16 DIAGNOSIS — I4891 Unspecified atrial fibrillation: Secondary | ICD-10-CM

## 2023-03-16 DIAGNOSIS — E038 Other specified hypothyroidism: Secondary | ICD-10-CM | POA: Diagnosis not present

## 2023-03-16 DIAGNOSIS — M25561 Pain in right knee: Secondary | ICD-10-CM

## 2023-03-16 DIAGNOSIS — M2550 Pain in unspecified joint: Secondary | ICD-10-CM

## 2023-03-16 MED ORDER — CELECOXIB 200 MG PO CAPS
200.0000 mg | ORAL_CAPSULE | Freq: Every day | ORAL | 0 refills | Status: DC | PRN
Start: 2023-03-16 — End: 2023-04-27

## 2023-03-16 NOTE — Progress Notes (Signed)
Established Patient Office Visit     CC/Reason for Visit: 20-month follow-up chronic conditions, medication refills  HPI: [name] is a 57 y.o. female who is coming in today for the above mentioned reasons. Past Medical History is significant for: Morbid obesity, hypertension, hypothyroidism, essential thrombocytosis, iron deficiency anemia, OSA, chronic heart block and A-fib status post pacemaker, B12 deficiency.  Has gone back to monthly B12 injections.  Has been dealing with some bilateral knee pain left greater than right which is hindering her ability to exercise.  She recently had metoprolol increased by cardiology due to an increased burden of PVCs.  She feels like these are improved.  Declines flu vaccine.   Past Medical/Surgical History: Past Medical History:  Diagnosis Date   Allergic rhinitis, cause unspecified    Allergy 2016   Anemia, iron deficiency    Arthritis    COVID-19 06/15/2020   Diastolic dysfunction    Diverticulosis of colon    GERD (gastroesophageal reflux disease)    Goiter    Hiatal hernia    Hypertension    IBS (irritable bowel syndrome)    Morbid obesity (HCC)    Obstructive sleep apnea    Sleep apnea    Thrombocytosis    Undiagnosed cardiac murmurs     Past Surgical History:  Procedure Laterality Date   BARIATRIC SURGERY  06/17/2010   BILATERAL SALPINGECTOMY Bilateral 06/22/2014   Procedure: BILATERAL SALPINGECTOMY;  Surgeon: Jaymes Graff, MD;  Location: [facility];  Service: Gynecology;  Laterality: Bilateral;   CYSTOSCOPY N/A 06/22/2014   Procedure: CYSTOSCOPY;  Surgeon: Jaymes Graff, MD;  Location: [facility];  Service: Gynecology;  Laterality: N/A;   ENDOMETRIAL ABLATION  04/2007   FOOT SURGERY Left 11/24/2017   Dr Charlsie Merles   LAPAROSCOPIC ASSISTED VAGINAL HYSTERECTOMY N/A 06/22/2014   Procedure: LAPAROSCOPIC ASSISTED VAGINAL HYSTERECTOMY;  Surgeon: Jaymes Graff, MD;  Location: [facility];  Service: Gynecology;  Laterality: N/A;   LEAD  REVISION/REPAIR N/A 12/17/2021   Procedure: LEAD REVISION/REPAIR;  Surgeon: Marinus Maw, MD;  [facility];  Service: Cardiovascular;  Laterality: N/A;   LIPOMA EXCISION     upper back   PACEMAKER IMPLANT N/A 12/09/2021   Procedure: PACEMAKER IMPLANT;  Surgeon: Marinus Maw, MD;  [facility];  Service: Cardiovascular;  Laterality: N/A;   SPINE SURGERY  02/10/2012   L4-l5 fusion--cohen    Social History:  reports that she has never smoked. She has never been exposed to tobacco smoke. She has never used smokeless tobacco. She reports that she does not drink alcohol and does not use drugs.  Allergies: Allergies  Allergen Reactions   Penicillins Other (See Comments)    Causes severe, difficult to treat yeast infections    Family History:  Family History  Problem Relation Age of Onset   Breast cancer Mother 19   Arrhythmia Mother    Heart disease Mother    Arthritis Mother    Diabetes Mother    Hyperlipidemia Mother    Hypertension Mother    Hypertension Father    Kidney cancer Father    Colon polyps Father    Lymphoma Father        Diffused Large B-cell Lymphoma   Cancer Father    Breast cancer Maternal Aunt 75   Stroke Maternal Grandmother 53   Ovarian cancer Paternal Grandmother    Healthy Daughter    Healthy Daughter    Healthy Daughter    Colon cancer  Neg Hx      Current Outpatient Medications:    acetaminophen (TYLENOL) 325 MG tablet, Take 1-2 tablets (325-650 mg total) by mouth every 4 (four) hours as needed for mild pain., Disp: , Rfl:    Biotin 1000 MCG tablet, Take 1,000 mcg by mouth every other day., Disp: , Rfl:    cetirizine (ZYRTEC) 10 MG tablet, Take 10 mg by mouth daily., Disp: , Rfl:    Cholecalciferol (VITAMIN D3) 50 MCG (2000 UT) TABS, Take 2,000 Units by mouth daily., Disp: , Rfl:    COLLAGEN PO, Take by mouth. Liquid collagen with biotin, Disp: , Rfl:    cyanocobalamin (VITAMIN B12) 1000 MCG/ML injection,  Inject 1 ml into the muscle once a week for a month.  Then inject 1 ml into the muscle once a month thereafter., Disp: 6 mL, Rfl: 11   famotidine (PEPCID) 40 MG tablet, Take 40 mg by mouth 2 (two) times daily., Disp: , Rfl:    fluticasone (FLONASE) 50 MCG/ACT nasal spray, Place 2 sprays into both nostrils daily. (Patient taking differently: Place 2 sprays into both nostrils daily as needed for allergies.), Disp: 16 g, Rfl: 6   furosemide (LASIX) 40 MG tablet, Take One tablet Every 2 weeks. Take with Potassium 20 meq, Disp: 30 tablet, Rfl: 0   Glucosamine HCl (GLUCOSAMINE PO), Take 1,000 mg by mouth daily., Disp: , Rfl:    hydrochlorothiazide (HYDRODIURIL) 25 MG tablet, TAKE 1 TABLET (25 MG TOTAL) BY MOUTH DAILY., Disp: 90 tablet, Rfl: 1   Ibuprofen 200 MG CAPS, Take 400 mg by mouth daily as needed (For pain)., Disp: , Rfl:    KLOR-CON M20 20 MEQ tablet, Take One tablet Every 2 weeks. Take with Lasix 40 mg, Disp: 30 tablet, Rfl: 0   levothyroxine (SYNTHROID) 88 MCG tablet, TAKE 1 TABLET BY MOUTH EVERY DAY, Disp: 90 tablet, Rfl: 1   loratadine (CLARITIN) 10 MG tablet, TAKE 1 TABLET BY MOUTH EVERY DAY, Disp: 90 tablet, Rfl: 2   Magnesium 250 MG TABS, Take 250 mg by mouth daily. For leg cramps, Disp: , Rfl:    Menthol, Topical Analgesic, (BIOFREEZE EX), Apply 1 application  topically daily as needed (On Knees and feet)., Disp: , Rfl:    metoprolol succinate (TOPROL XL) 25 MG 24 hr tablet, Take 1 tablet (25 mg total) by mouth 2 (two) times daily., Disp: 180 tablet, Rfl: 3   Misc Natural Products (YUMVS BEET ROOT-TART CHERRY) 250-0.5 MG CHEW, Chew 1 tablet by mouth 2 (two) times a week., Disp: , Rfl:    rosuvastatin (CRESTOR) 5 MG tablet, Take 1 tablet (5 mg total) by mouth at bedtime., Disp: 30 tablet, Rfl: 11   SYRINGE-NEEDLE, DISP, 3 ML (BD SAFETYGLIDE SYRINGE/NEEDLE) 25G X 1" 3 ML MISC, Use for B12 injections, Disp: 100 each, Rfl: 3   Turmeric 500 MG CAPS, Take 500 mg by mouth as needed., Disp: , Rfl:     valsartan (DIOVAN) 160 MG tablet, Take 1 tablet (160 mg total) by mouth daily., Disp: 90 tablet, Rfl: 3   celecoxib (CELEBREX) 200 MG capsule, Take 1 capsule (200 mg total) by mouth daily as needed for mild pain (pain score 1-3) or moderate pain (pain score 4-6)., Disp: 60 capsule, Rfl: 0  Review of Systems:  Negative unless indicated in HPI.   Physical Exam: Vitals:   03/16/23 0855  BP: 120/80  Pulse: 60  Temp: 98 F (36.7 C)  TempSrc: Oral  SpO2: 99%  Weight: 247 lb 11.2  oz (112.4 kg)    Body mass index is 43.88 kg/m.   Physical Exam Vitals reviewed.  Constitutional:      Appearance: Normal appearance. She is obese.  HENT:     Head: Normocephalic and atraumatic.  Eyes:     Conjunctiva/sclera: Conjunctivae normal.     Pupils: Pupils are equal, round, and reactive to light.  Cardiovascular:     Rate and Rhythm: Normal rate and regular rhythm.  Pulmonary:     Effort: Pulmonary effort is normal.     Breath sounds: Normal breath sounds.  Skin:    General: Skin is warm and dry.  Neurological:     General: No focal deficit present.     Mental Status: She is alert and oriented to person, place, and time.  Psychiatric:        Mood and Affect: Mood normal.        Behavior: Behavior normal.        Thought Content: Thought content normal.        Judgment: Judgment normal.      Impression and Plan:  Other specified hypothyroidism  MORBID OBESITY  Essential hypertension  Atrial fibrillation, unspecified type (HCC)  Chronic pain of both knees -     Ambulatory referral to Orthopedic Surgery  PAIN IN JOINT, MULTIPLE SITES -     Celecoxib; Take 1 capsule (200 mg total) by mouth daily as needed for mild pain (pain score 1-3) or moderate pain (pain score 4-6).  Dispense: 60 capsule; Refill: 0   -Celebrex refilled until she can see orthopedics next summer. -Referral to orthopedics placed for bilateral knee pain presumed to be osteoarthritis. -Blood pressure is  well-controlled. -Discussed healthy lifestyle, including increased physical activity and better food choices to promote weight loss.   Time spent:32 minutes reviewing chart, interviewing and examining patient and formulating plan of care.     Chaya Jan, MD [facility]

## 2023-03-25 ENCOUNTER — Other Ambulatory Visit: Payer: Self-pay | Admitting: Cardiology

## 2023-03-30 DIAGNOSIS — G4733 Obstructive sleep apnea (adult) (pediatric): Secondary | ICD-10-CM | POA: Diagnosis not present

## 2023-03-31 ENCOUNTER — Encounter (INDEPENDENT_AMBULATORY_CARE_PROVIDER_SITE_OTHER): Payer: Self-pay | Admitting: Physician Assistant

## 2023-03-31 ENCOUNTER — Ambulatory Visit (INDEPENDENT_AMBULATORY_CARE_PROVIDER_SITE_OTHER): Payer: 59 | Admitting: Physician Assistant

## 2023-03-31 VITALS — BP 120/79 | HR 59 | Temp 97.9°F | Ht 63.0 in | Wt 244.0 lb

## 2023-03-31 DIAGNOSIS — R7303 Prediabetes: Secondary | ICD-10-CM

## 2023-03-31 DIAGNOSIS — K219 Gastro-esophageal reflux disease without esophagitis: Secondary | ICD-10-CM

## 2023-03-31 DIAGNOSIS — E782 Mixed hyperlipidemia: Secondary | ICD-10-CM

## 2023-03-31 DIAGNOSIS — D508 Other iron deficiency anemias: Secondary | ICD-10-CM

## 2023-03-31 DIAGNOSIS — Z6841 Body Mass Index (BMI) 40.0 and over, adult: Secondary | ICD-10-CM

## 2023-03-31 DIAGNOSIS — G4733 Obstructive sleep apnea (adult) (pediatric): Secondary | ICD-10-CM

## 2023-03-31 DIAGNOSIS — E559 Vitamin D deficiency, unspecified: Secondary | ICD-10-CM | POA: Diagnosis not present

## 2023-03-31 DIAGNOSIS — Z9884 Bariatric surgery status: Secondary | ICD-10-CM | POA: Diagnosis not present

## 2023-03-31 DIAGNOSIS — M48061 Spinal stenosis, lumbar region without neurogenic claudication: Secondary | ICD-10-CM

## 2023-03-31 DIAGNOSIS — I10 Essential (primary) hypertension: Secondary | ICD-10-CM | POA: Diagnosis not present

## 2023-03-31 DIAGNOSIS — I519 Heart disease, unspecified: Secondary | ICD-10-CM

## 2023-03-31 DIAGNOSIS — E538 Deficiency of other specified B group vitamins: Secondary | ICD-10-CM | POA: Diagnosis not present

## 2023-03-31 DIAGNOSIS — E66813 Obesity, class 3: Secondary | ICD-10-CM | POA: Diagnosis not present

## 2023-03-31 DIAGNOSIS — M150 Primary generalized (osteo)arthritis: Secondary | ICD-10-CM

## 2023-03-31 DIAGNOSIS — I4891 Unspecified atrial fibrillation: Secondary | ICD-10-CM

## 2023-03-31 DIAGNOSIS — E039 Hypothyroidism, unspecified: Secondary | ICD-10-CM

## 2023-03-31 DIAGNOSIS — Z0289 Encounter for other administrative examinations: Secondary | ICD-10-CM

## 2023-03-31 DIAGNOSIS — Z950 Presence of cardiac pacemaker: Secondary | ICD-10-CM
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
## 2023-04-14 ENCOUNTER — Encounter: Payer: Self-pay | Admitting: Internal Medicine

## 2023-04-15 NOTE — Telephone Encounter (Signed)
[facility] tried to contact patient but the number on file did not go through for them.   I was able to schedule patient for December 4th at 1:45pm with Dr.Jackie.   Address: [address] -- 2nd floor [address] [place], [place] [address]  If patient would like to reschedule appointment, she could reach [facility] at [phone].  Thanks, Gardiner Sleeper

## 2023-04-23 ENCOUNTER — Other Ambulatory Visit: Payer: Self-pay | Admitting: Internal Medicine

## 2023-04-23 DIAGNOSIS — M2550 Pain in unspecified joint: Secondary | ICD-10-CM

## 2023-04-27 ENCOUNTER — Other Ambulatory Visit: Payer: Self-pay | Admitting: Internal Medicine

## 2023-04-27 DIAGNOSIS — Z1231 Encounter for screening mammogram for malignant neoplasm of breast: Secondary | ICD-10-CM

## 2023-05-01 DIAGNOSIS — M170 Bilateral primary osteoarthritis of knee: Secondary | ICD-10-CM | POA: Diagnosis not present

## 2023-05-05 ENCOUNTER — Ambulatory Visit: Payer: 59 | Attending: Internal Medicine | Admitting: Internal Medicine

## 2023-05-05 VITALS — BP 132/70 | HR 60 | Ht 63.0 in | Wt 243.0 lb

## 2023-05-05 DIAGNOSIS — I442 Atrioventricular block, complete: Secondary | ICD-10-CM

## 2023-05-05 DIAGNOSIS — R001 Bradycardia, unspecified: Secondary | ICD-10-CM | POA: Diagnosis not present

## 2023-05-05 LAB — CUP PACEART INCLINIC DEVICE CHECK
Date Time Interrogation Session: 20241210145409
Implantable Lead Connection Status: 753985
Implantable Lead Connection Status: 753985
Implantable Lead Implant Date: 20230717
Implantable Lead Implant Date: 20230717
Implantable Lead Location: 753859
Implantable Lead Location: 753860
Implantable Lead Model: 7841
Implantable Lead Model: 7842
Implantable Lead Serial Number: 1204466
Implantable Lead Serial Number: 1298900
Implantable Pulse Generator Implant Date: 20230717
Lead Channel Impedance Value: 560 Ohm
Lead Channel Impedance Value: 761 Ohm
Lead Channel Pacing Threshold Amplitude: 0.9 V
Lead Channel Pacing Threshold Amplitude: 1.4 V
Lead Channel Pacing Threshold Pulse Width: 0.4 ms
Lead Channel Pacing Threshold Pulse Width: 1 ms
Lead Channel Sensing Intrinsic Amplitude: 19.4 mV
Lead Channel Sensing Intrinsic Amplitude: 2.6 mV
Lead Channel Setting Pacing Amplitude: 2.5 V
Lead Channel Setting Pacing Amplitude: 3.3 V
Lead Channel Setting Pacing Pulse Width: 1 ms
Lead Channel Setting Sensing Sensitivity: 1 mV
Pulse Gen Serial Number: 603605
Zone Setting Status: 755011

## 2023-05-05 NOTE — Progress Notes (Signed)
HPI rs. Najjar returns today for followup. She is a pleasant 57 yo woman with intermittent AV block who underwent PPM insertion complicated by lead migration and asymptomatic perforation. She underwent insertion of a new lead. She had problems with significant cardiac awareness and workup was unremarkable. She was noted to have an initial elevation in her pacing threshold. Overall she feels better.  Allergies  Allergen Reactions   Penicillins Other (See Comments)    Causes severe, difficult to treat yeast infections     Current Outpatient Medications  Medication Sig Dispense Refill   acetaminophen (TYLENOL) 325 MG tablet Take 1-2 tablets (325-650 mg total) by mouth every 4 (four) hours as needed for mild pain.     Biotin 1000 MCG tablet Take 1,000 mcg by mouth every other day.     celecoxib (CELEBREX) 200 MG capsule TAKE 1CAP BY MOUTH DAILY AS NEEDED FOR MILD PAIN (PAIN SCORE 1-3)/MODERATE PAIN (PAIN SCORE 4-6). 30 capsule 1   cetirizine (ZYRTEC) 10 MG tablet Take 10 mg by mouth daily.     Cholecalciferol (VITAMIN D3) 50 MCG (2000 UT) TABS Take 2,000 Units by mouth daily.     COLLAGEN PO Take by mouth. Liquid collagen with biotin     cyanocobalamin (VITAMIN B12) 1000 MCG/ML injection Inject 1 ml into the muscle once a week for a month.  Then inject 1 ml into the muscle once a month thereafter. 6 mL 11   famotidine (PEPCID) 40 MG tablet Take 40 mg by mouth 2 (two) times daily.     fluticasone (FLONASE) 50 MCG/ACT nasal spray Place 2 sprays into both nostrils daily. (Patient taking differently: Place 2 sprays into both nostrils daily as needed for allergies.) 16 g 6   furosemide (LASIX) 40 MG tablet TAKE ONE TABLET EVERY 2 WEEKS. TAKE WITH POTASSIUM 20 MEQ 32 tablet 0   Glucosamine HCl (GLUCOSAMINE PO) Take 1,000 mg by mouth daily.     hydrochlorothiazide (HYDRODIURIL) 25 MG tablet TAKE 1 TABLET (25 MG TOTAL) BY MOUTH DAILY. 90 tablet 1   Ibuprofen 200 MG CAPS Take 400 mg by mouth daily  as needed (For pain).     KLOR-CON M20 20 MEQ tablet Take One tablet Every 2 weeks. Take with Lasix 40 mg 30 tablet 0   levothyroxine (SYNTHROID) 88 MCG tablet TAKE 1 TABLET BY MOUTH EVERY DAY 90 tablet 1   loratadine (CLARITIN) 10 MG tablet TAKE 1 TABLET BY MOUTH EVERY DAY 90 tablet 2   Magnesium 250 MG TABS Take 250 mg by mouth daily. For leg cramps     Menthol, Topical Analgesic, (BIOFREEZE EX) Apply 1 application  topically daily as needed (On Knees and feet).     metoprolol succinate (TOPROL XL) 25 MG 24 hr tablet Take 1 tablet (25 mg total) by mouth 2 (two) times daily. 180 tablet 3   Misc Natural Products (YUMVS BEET ROOT-TART CHERRY) 250-0.5 MG CHEW Chew 1 tablet by mouth 2 (two) times a week.     rosuvastatin (CRESTOR) 5 MG tablet Take 1 tablet (5 mg total) by mouth at bedtime. 30 tablet 11   SYRINGE-NEEDLE, DISP, 3 ML (BD SAFETYGLIDE SYRINGE/NEEDLE) 25G X 1" 3 ML MISC Use for B12 injections 100 each 3   Turmeric 500 MG CAPS Take 500 mg by mouth as needed.     valsartan (DIOVAN) 160 MG tablet Take 1 tablet (160 mg total) by mouth daily. 90 tablet 3   No current facility-administered medications for this  visit.     Past Medical History:  Diagnosis Date   Allergic rhinitis, cause unspecified    Allergy 2016   Anemia, iron deficiency    Arthritis    COVID-19 06/15/2020   Diastolic dysfunction    Diverticulosis of colon    GERD (gastroesophageal reflux disease)    Goiter    Hiatal hernia    Hypertension    IBS (irritable bowel syndrome)    Morbid obesity (HCC)    Obstructive sleep apnea    Sleep apnea    Thrombocytosis    Undiagnosed cardiac murmurs     ROS:   All systems reviewed and negative except as noted in the HPI.   Past Surgical History:  Procedure Laterality Date   BARIATRIC SURGERY  06/17/2010   BILATERAL SALPINGECTOMY Bilateral 06/22/2014   Procedure: BILATERAL SALPINGECTOMY;  Surgeon: Jaymes Graff, MD;  Location: [facility];  Service: Gynecology;   Laterality: Bilateral;   CYSTOSCOPY N/A 06/22/2014   Procedure: CYSTOSCOPY;  Surgeon: Jaymes Graff, MD;  Location: [facility];  Service: Gynecology;  Laterality: N/A;   ENDOMETRIAL ABLATION  04/2007   FOOT SURGERY Left 11/24/2017   Dr Charlsie Merles   LAPAROSCOPIC ASSISTED VAGINAL HYSTERECTOMY N/A 06/22/2014   Procedure: LAPAROSCOPIC ASSISTED VAGINAL HYSTERECTOMY;  Surgeon: Jaymes Graff, MD;  Location: [facility];  Service: Gynecology;  Laterality: N/A;   LEAD REVISION/REPAIR N/A 12/17/2021   Procedure: LEAD REVISION/REPAIR;  Surgeon: Marinus Maw, MD;  Location: [facility];  Service: Cardiovascular;  Laterality: N/A;   LIPOMA EXCISION     upper back   PACEMAKER IMPLANT N/A 12/09/2021   Procedure: PACEMAKER IMPLANT;  Surgeon: Marinus Maw, MD;  Location: [facility];  Service: Cardiovascular;  Laterality: N/A;   SPINE SURGERY  02/10/2012   L4-l5 fusion--cohen     Family History  Problem Relation Age of Onset   Breast cancer Mother 22   Arrhythmia Mother    Heart disease Mother    Arthritis Mother    Diabetes Mother    Hyperlipidemia Mother    Hypertension Mother    Hypertension Father    Kidney cancer Father    Colon polyps Father    Lymphoma Father        Diffused Large B-cell Lymphoma   Cancer Father    Breast cancer Maternal Aunt 69   Stroke Maternal Grandmother 10   Ovarian cancer Paternal Grandmother    Healthy Daughter    Healthy Daughter    Healthy Daughter    Colon cancer Neg Hx      Social History   Socioeconomic History   Marital status: Married    Spouse name: Earl Lites   Number of children: 3   Years of education: Some college   Highest education level: 12th grade  Occupational History   Occupation: Herbalist: UNEMPLOYED  Tobacco Use   Smoking status: Never    Passive exposure: Never   Smokeless tobacco: Never   Tobacco comments:    Never Used Tobacco  Vaping Use   Vaping status: Never Used  Substance and Sexual Activity    Alcohol use: No   Drug use: No   Sexual activity: Yes    Partners: Male    Birth control/protection: Surgical    Comment: Hysterectomy  Other Topics Concern   Not on file  Social History Narrative   09/21/19   From: [place], [place] originally   Living: with husband, Earl Lites and 2 daughters w/ family  Work: homemaker      Family: 3 children - [place], [place], and [facility]. Has 8 grandchildren and one on the way      Enjoys: spend time with family, shopping      Exercise: trying to walk more   Diet: eats late at night, will go most of the day w/o eating      Safety   Seat belts: Yes    Guns: Yes  and secure   Safe in relationships: Yes    Social Determinants of Health   Financial Resource Strain: Low Risk  (03/15/2023)   Overall Financial Resource Strain (CARDIA)    Difficulty of Paying Living Expenses: Not hard at all  Food Insecurity: No Food Insecurity (03/15/2023)   Hunger Vital Sign    Worried About Running Out of Food in the Last Year: Never true    Ran Out of Food in the Last Year: Never true  Transportation Needs: No Transportation Needs (03/15/2023)   PRAPARE - Administrator, Civil Service (Medical): No    Lack of Transportation (Non-Medical): No  Physical Activity: Insufficiently Active (03/15/2023)   Exercise Vital Sign    Days of Exercise per Week: 1 day    Minutes of Exercise per Session: 10 min  Stress: No Stress Concern Present (03/15/2023)   [employer] of Occupational Health - Occupational Stress Questionnaire    Feeling of Stress : Not at all  Social Connections: Moderately Integrated (03/15/2023)   Social Connection and Isolation Panel [NHANES]    Frequency of Communication with Friends and Family: More than three times a week    Frequency of Social Gatherings with Friends and Family: Twice a week    Attends Religious Services: More than 4 times per year    Active Member of [employer] or Organizations: No    Attends Hospital doctor: Not on file    Marital Status: Married  Catering manager Violence: Not on file     BP 132/70   Pulse 60   Ht 5\' 3"  (1.6 m)   Wt 243 lb (110.2 kg)   LMP 01/20/2011   SpO2 99%   BMI 43.05 kg/m   Physical Exam:  Well appearing NAD HEENT: Unremarkable Neck:  No JVD, no thyromegally Lymphatics:  No adenopathy Back:  No CVA tenderness Lungs:  Clear HEART:  Regular rate rhythm, no murmurs, no rubs, no clicks Abd:  soft, positive bowel sounds, no organomegally, no rebound, no guarding Ext:  2 plus pulses, no edema, no cyanosis, no clubbing Skin:  No rashes no nodules Neuro:  CN II through XII intact, motor grossly intact  EKG  DEVICE  Normal device function.  See PaceArt for details.   Assess/Plan:  Intermittent heart block - she is asymptomatic s/p PPM. She is pacing less than 1% of the time in the ventricle. PAF/SVT - she has had episodes lasting up to 90 seconds. I have asked her to continue her toprol and increase to a whole 25 mg tablet daily.  PVC's - she has had minimal symptoms. We will follow.  Obesity - she needs to lose weight. She is up 8 lbs since her last visit.   Sharlot Gowda Kamali Nephew,MD

## 2023-05-05 NOTE — Patient Instructions (Signed)

## 2023-05-12 ENCOUNTER — Ambulatory Visit (INDEPENDENT_AMBULATORY_CARE_PROVIDER_SITE_OTHER): Payer: 59 | Admitting: Internal Medicine

## 2023-05-21 ENCOUNTER — Ambulatory Visit
Admission: RE | Admit: 2023-05-21 | Discharge: 2023-05-21 | Disposition: A | Discharge status: Home / Self Care | Payer: 59 | Source: Ambulatory Visit | Attending: Internal Medicine | Admitting: Internal Medicine

## 2023-05-21 DIAGNOSIS — Z1231 Encounter for screening mammogram for malignant neoplasm of breast: Secondary | ICD-10-CM | POA: Diagnosis not present

## 2023-05-26 ENCOUNTER — Ambulatory Visit (INDEPENDENT_AMBULATORY_CARE_PROVIDER_SITE_OTHER): Payer: 59 | Admitting: Internal Medicine

## 2023-06-02 ENCOUNTER — Encounter: Payer: Self-pay | Admitting: Neurology

## 2023-06-02 ENCOUNTER — Encounter: Payer: Self-pay | Admitting: Family

## 2023-06-02 ENCOUNTER — Ambulatory Visit (INDEPENDENT_AMBULATORY_CARE_PROVIDER_SITE_OTHER): Payer: No Typology Code available for payment source | Admitting: Neurology

## 2023-06-02 VITALS — BP 143/82 | HR 62 | Ht 63.0 in | Wt 248.0 lb

## 2023-06-02 DIAGNOSIS — R4789 Other speech disturbances: Secondary | ICD-10-CM

## 2023-06-02 DIAGNOSIS — R4189 Other symptoms and signs involving cognitive functions and awareness: Secondary | ICD-10-CM | POA: Diagnosis not present

## 2023-06-02 DIAGNOSIS — E538 Deficiency of other specified B group vitamins: Secondary | ICD-10-CM

## 2023-06-02 NOTE — Progress Notes (Signed)
 [facility]  PATIENT: [name] DOB: May 15, 1966  REQUESTING CLINICIAN: Theophilus Andrews, Est[place]* HISTORY FROM: Patient  REASON FOR VISIT: Word finding difficulty    HISTORICAL  CHIEF COMPLAINT:  Chief Complaint  Patient presents with   Follow-up    Rm 13. Alone. Patient reports heaviness in left forehead, full feeling, random dizziness more frequent when lying down, word finding difficulty.   INTERVAL HISTORY 06/02/2023 Patient presents today for follow-up, last visit was in June.  Since then she tells me that her word finding difficulty is about the same or even getting worse.  She is more forgetful than before.  She also complaining of heaviness on the left side of her head and lightheadedness.  These episodes started around Thanksgiving and they are still present.  She tells me that these headaches are not debilitating, she can function but  it is noticeable. She continues to get B12 shots     HISTORY OF PRESENT ILLNESS:  This is a 58 year old woman past medical history of hypertension, hyperlipidemia, heart disease, obstructive sleep apnea on CPAP, vitamin B12 deficiency who is presenting with complaint of word finding difficulty.  Patient reports the word finding difficulty started since last year, early last year.  She had difficulty finding the right word.  At that time she was also having heart trouble, and episode of presyncope. She was found to be in heart block and had a pacemaker in July 2023. Right after the pacemaker placement, her word finding difficulty got better but now she is having worsening problems, more finding difficulty and memory problem.  She does live at home with her husband, her husband has complained about her memory problem, her forgetfulness but she is still independent in all activities of daily living.  She has to write thing down, on one occasion, left the stove on. She also has been reporting falls, she reported a total of  3 falls this year, unexplained fall, denies any mechanical fall, no prodrome of lightheadedness change in vision prior to the fall.  2 falls at home and 1 at [facility]. She denies any major injury from this fall.  Her last fall was 2 months ago.    OTHER MEDICAL CONDITIONS: Hypertension, Hyperlipidemia, Heart disease, B12 deficiency, OSA on CPAP   REVIEW OF SYSTEMS: Full 14 system review of systems performed and negative with exception of: As noted in the HPI   ALLERGIES: Allergies  Allergen Reactions   Penicillins Other (See Comments)    Causes severe, difficult to treat yeast infections    HOME MEDICATIONS: Outpatient Medications Prior to Visit  Medication Sig Dispense Refill   acetaminophen  (TYLENOL ) 325 MG tablet Take 1-2 tablets (325-650 mg total) by mouth every 4 (four) hours as needed for mild pain.     Biotin 1000 MCG tablet Take 1,000 mcg by mouth every other day.     celecoxib  (CELEBREX ) 200 MG capsule TAKE 1CAP BY MOUTH DAILY AS NEEDED FOR MILD PAIN (PAIN SCORE 1-3)/MODERATE PAIN (PAIN SCORE 4-6). 30 capsule 1   cetirizine  (ZYRTEC ) 10 MG tablet Take 10 mg by mouth daily.     Cholecalciferol  (VITAMIN D3) 50 MCG (2000 UT) TABS Take 2,000 Units by mouth daily.     COLLAGEN PO Take by mouth. Liquid collagen with biotin     cyanocobalamin  (VITAMIN B12) 1000 MCG/ML injection Inject 1 ml into the muscle once a week for a month.  Then inject 1 ml into the muscle once a month thereafter. 6 mL 11  famotidine (PEPCID) 40 MG tablet Take 40 mg by mouth 2 (two) times daily.     fluticasone  (FLONASE ) 50 MCG/ACT nasal spray Place 2 sprays into both nostrils daily. (Patient taking differently: Place 2 sprays into both nostrils daily as needed for allergies.) 16 g 6   furosemide  (LASIX ) 40 MG tablet TAKE ONE TABLET EVERY 2 WEEKS. TAKE WITH POTASSIUM 20 MEQ 32 tablet 0   Glucosamine HCl (GLUCOSAMINE PO) Take 1,000 mg by mouth daily.     hydrochlorothiazide  (HYDRODIURIL ) 25 MG tablet TAKE 1  TABLET (25 MG TOTAL) BY MOUTH DAILY. 90 tablet 1   Ibuprofen  200 MG CAPS Take 400 mg by mouth daily as needed (For pain).     KLOR-CON  M20 20 MEQ tablet Take One tablet Every 2 weeks. Take with Lasix  40 mg 30 tablet 0   levothyroxine  (SYNTHROID ) 88 MCG tablet TAKE 1 TABLET BY MOUTH EVERY DAY 90 tablet 1   loratadine  (CLARITIN ) 10 MG tablet TAKE 1 TABLET BY MOUTH EVERY DAY 90 tablet 2   Magnesium 250 MG TABS Take 250 mg by mouth daily. For leg cramps     Menthol , Topical Analgesic, (BIOFREEZE EX) Apply 1 application  topically daily as needed (On Knees and feet).     metoprolol  succinate (TOPROL  XL) 25 MG 24 hr tablet Take 1 tablet (25 mg total) by mouth 2 (two) times daily. 180 tablet 3   Misc Natural Products (YUMVS BEET ROOT-TART CHERRY) 250-0.5 MG CHEW Chew 1 tablet by mouth 2 (two) times a week.     rosuvastatin  (CRESTOR ) 5 MG tablet Take 1 tablet (5 mg total) by mouth at bedtime. 30 tablet 11   SYRINGE-NEEDLE, DISP, 3 ML (BD SAFETYGLIDE SYRINGE/NEEDLE) 25G X 1 3 ML MISC Use for B12 injections 100 each 3   Turmeric 500 MG CAPS Take 500 mg by mouth as needed.     valsartan  (DIOVAN ) 160 MG tablet Take 1 tablet (160 mg total) by mouth daily. 90 tablet 3   No facility-administered medications prior to visit.    PAST MEDICAL HISTORY: Past Medical History:  Diagnosis Date   Allergic rhinitis, cause unspecified    Allergy 2016   Anemia, iron  deficiency    Arthritis    COVID-19 06/15/2020   Diastolic dysfunction    Diverticulosis of colon    GERD (gastroesophageal reflux disease)    Goiter    Hiatal hernia    Hypertension    IBS (irritable bowel syndrome)    Morbid obesity (HCC)    Obstructive sleep apnea    Sleep apnea    Thrombocytosis    Undiagnosed cardiac murmurs     PAST SURGICAL HISTORY: Past Surgical History:  Procedure Laterality Date   BARIATRIC SURGERY  06/17/2010   BILATERAL SALPINGECTOMY Bilateral 06/22/2014   Procedure: BILATERAL SALPINGECTOMY;  Surgeon: Ovid All, MD;  Location: [facility];  Service: Gynecology;  Laterality: Bilateral;   CYSTOSCOPY N/A 06/22/2014   Procedure: CYSTOSCOPY;  Surgeon: Ovid All, MD;  Location: [facility];  Service: Gynecology;  Laterality: N/A;   ENDOMETRIAL ABLATION  04/2007   FOOT SURGERY Left 11/24/2017   Dr Magdalen   LAPAROSCOPIC ASSISTED VAGINAL HYSTERECTOMY N/A 06/22/2014   Procedure: LAPAROSCOPIC ASSISTED VAGINAL HYSTERECTOMY;  Surgeon: Ovid All, MD;  Location: [facility];  Service: Gynecology;  Laterality: N/A;   LEAD REVISION/REPAIR N/A 12/17/2021   Procedure: LEAD REVISION/REPAIR;  Surgeon: Waddell Danelle ORN, MD;  Location: [facility]AS[facility];  Service: Cardiovascular;  Laterality: N/A;   LIPOMA EXCISION  upper back   PACEMAKER IMPLANT N/A 12/09/2021   Procedure: PACEMAKER IMPLANT;  Surgeon: Waddell Danelle ORN, MD;  Location: [facility] INVASIVE CV LAB;  Service: Cardiovascular;  Laterality: N/A;   SPINE SURGERY  02/10/2012   L4-l5 fusion--cohen    FAMILY HISTORY: Family History  Problem Relation Age of Onset   Breast cancer Mother 1   Arrhythmia Mother    Heart disease Mother    Arthritis Mother    Diabetes Mother    Hyperlipidemia Mother    Hypertension Mother    Hypertension Father    Kidney cancer Father    Colon polyps Father    Lymphoma Father        Diffused Large B-cell Lymphoma   Cancer Father    Breast cancer Maternal Aunt 7   Stroke Maternal Grandmother 78   Ovarian cancer Paternal Grandmother    Healthy Daughter    Healthy Daughter    Healthy Daughter    Colon cancer Neg Hx     SOCIAL HISTORY: Social History   Socioeconomic History   Marital status: Married    Spouse name: Cordella   Number of children: 3   Years of education: Some college   Highest education level: 12th grade  Occupational History   Occupation: Herbalist: UNEMPLOYED  Tobacco Use   Smoking status: Never    Passive exposure: Never   Smokeless tobacco: Never   Tobacco comments:    Never Used  Tobacco  Vaping Use   Vaping status: Never Used  Substance and Sexual Activity   Alcohol use: No   Drug use: No   Sexual activity: Yes    Partners: Male    Birth control/protection: Surgical    Comment: Hysterectomy  Other Topics Concern   Not on file  Social History Narrative   09/21/19   From: [place], [place] originally   Living: with husband, Cordella and 2 daughters w/ family   Work: homemaker      Family: 3 children - Sierra, [place], and Hospital Doctor. Has 8 grandchildren and one on the way      Enjoys: spend time with family, shopping      Exercise: trying to walk more   Diet: eats late at night, will go most of the day w/o eating      Safety   Seat belts: Yes    Guns: Yes  and secure   Safe in relationships: Yes    Social Drivers of Health   Financial Resource Strain: Low Risk  (03/15/2023)   Overall Financial Resource Strain (CARDIA)    Difficulty of Paying Living Expenses: Not hard at all  Food Insecurity: No Food Insecurity (03/15/2023)   Hunger Vital Sign    Worried About Running Out of Food in the Last Year: Never true    Ran Out of Food in the Last Year: Never true  Transportation Needs: No Transportation Needs (03/15/2023)   PRAPARE - Administrator, Civil Service (Medical): No    Lack of Transportation (Non-Medical): No  Physical Activity: Insufficiently Active (03/15/2023)   Exercise Vital Sign    Days of Exercise per Week: 1 day    Minutes of Exercise per Session: 10 min  Stress: No Stress Concern Present (03/15/2023)   [employer] of Occupational Health - Occupational Stress Questionnaire    Feeling of Stress : Not at all  Social Connections: Moderately Integrated (03/15/2023)   Social Connection and Isolation Panel [NHANES]    Frequency of  Communication with Friends and Family: More than three times a week    Frequency of Social Gatherings with Friends and Family: Twice a week    Attends Religious Services: More than 4 times per year     Active Member of [employer] or Organizations: No    Attends Engineer, Structural: Not on file    Marital Status: Married  Catering Manager Violence: Not on file    PHYSICAL EXAM  GENERAL EXAM/CONSTITUTIONAL: Vitals:  Vitals:   06/02/23 1338  BP: (!) 143/82  Pulse: 62  Weight: 248 lb (112.5 kg)  Height: 5' 3 (1.6 m)   Body mass index is 43.93 kg/m. Wt Readings from Last 3 Encounters:  06/02/23 248 lb (112.5 kg)  05/05/23 243 lb (110.2 kg)  03/31/23 244 lb (110.7 kg)   Patient is in no distress; well developed, nourished and groomed; neck is supple  MUSCULOSKELETAL: Gait, strength, tone, movements noted in Neurologic exam below  NEUROLOGIC: MENTAL STATUS:      No data to display         awake, alert, oriented to person, place and time recent and remote memory intact normal attention and concentration language fluent, comprehension intact, naming intact fund of knowledge appropriate  CRANIAL NERVE:  2nd, 3rd, 4th, 6th - Visual fields full to confrontation, extraocular muscles intact, no nystagmus 5th - facial sensation symmetric 7th - facial strength symmetric 8th - hearing intact 9th - palate elevates symmetrically, uvula midline 11th - shoulder shrug symmetric 12th - tongue protrusion midline  MOTOR:  normal bulk and tone, full strength in the BUE, BLE  SENSORY:  normal and symmetric to light touch  COORDINATION:  finger-nose-finger, fine finger movements normal  REFLEXES:  deep tendon reflexes present and symmetric  GAIT/STATION:  normal   DIAGNOSTIC DATA (LABS, IMAGING, TESTING) - I reviewed patient records, labs, notes, testing and imaging myself where available.  Lab Results  Component Value Date   WBC 7.7 12/16/2022   HGB 13.4 12/16/2022   HCT 41.0 12/16/2022   MCV 87.4 12/16/2022   PLT 470 (H) 12/16/2022      Component Value Date/Time   NA 138 12/16/2022 0931   NA 145 (H) 09/02/2022 1529   NA 138 04/27/2017 0933   K 3.6  12/16/2022 0931   K 3.3 (L) 04/27/2017 0933   CL 100 12/16/2022 0931   CO2 29 12/16/2022 0931   CO2 26 04/27/2017 0933   GLUCOSE 105 (H) 12/16/2022 0931   GLUCOSE 85 04/27/2017 0933   BUN 13 12/16/2022 0931   BUN 16 09/02/2022 1529   BUN 12.2 04/27/2017 0933   CREATININE 0.64 12/16/2022 0931   CREATININE 0.7 04/27/2017 0933   CALCIUM  9.3 12/16/2022 0931   CALCIUM  9.1 04/27/2017 0933   PROT 7.5 12/16/2022 0931   PROT 7.5 04/27/2017 0933   ALBUMIN 4.5 12/16/2022 0931   ALBUMIN 4.0 04/27/2017 0933   AST 23 12/16/2022 0931   AST 25 04/27/2017 0933   ALT 19 12/16/2022 0931   ALT 19 04/27/2017 0933   ALKPHOS 129 (H) 12/16/2022 0931   ALKPHOS 139 04/27/2017 0933   BILITOT 0.4 12/16/2022 0931   BILITOT 0.58 04/27/2017 0933   GFRNONAA >60 12/16/2022 0931   GFRAA >60 01/27/2020 0923   GFRAA >60 12/12/2019 1036   Lab Results  Component Value Date   CHOL 190 09/09/2022   HDL 60.10 09/09/2022   LDLCALC 113 (H) 09/09/2022   TRIG 84.0 09/09/2022   CHOLHDL 3 09/09/2022   Lab  Results  Component Value Date   HGBA1C 5.9 09/09/2022   Lab Results  Component Value Date   VITAMINB12 827 06/02/2023   Lab Results  Component Value Date   TSH 1.51 09/09/2022   MRI Brain 01/27/2023 1. No evidence of an acute intracranial abnormality. 2. Multifocal T2 FLAIR hyperintense signal abnormality within the cerebral white matter, mild but greater than expected for age. These signal changes are nonspecific and differential considerations include chronic small ischemic disease and sequelae of chronic migraine headaches, among others. 3. Partially empty sella turcica. This finding can reflect incidental anatomic variation, or alternatively, it can be associated with idiopathic intracranial hypertension (pseudotumor cerebri).   ASSESSMENT AND PLAN  58 y.o. year old female with hypertension, hyperlipidemia, heart disease, obstructive sleep apnea on CPAP, vitamin B12 deficiency who is presenting with  complaint of word finding difficulty, memory loss and falls.  Even though she has been complaining of word finding difficulty and memory loss, her neurological examination was nonfocal.  MRI brain showed small vessel disease and a partially empty sella otherwise, no acute abnormalities. She is getting B12 injection, will check a B12 level and add copper  due to her history of gastric bypass. Continue to follow up with PCP otherwise return as scheduled.     1. Brain fog   2. Word finding difficulty   3. Vitamin B12 deficiency      Patient Instructions  Continue current medications  Will check B12 and folate  Return in 6 months or sooner if worse   Orders Placed This Encounter  Procedures   B12 and Folate Panel   Copper , serum    No orders of the defined types were placed in this encounter.   Return in about 6 months (around 11/30/2023).   Pastor Falling, MD 06/03/2023, 3:46 PM  [facility] [address] [place], [place] [address] [phone]

## 2023-06-03 ENCOUNTER — Encounter: Payer: Self-pay | Admitting: Family

## 2023-06-03 NOTE — Patient Instructions (Signed)
 Continue current medications  Will check B12 and folate  Return in 6 months or sooner if worse

## 2023-06-04 ENCOUNTER — Encounter: Payer: Self-pay | Admitting: Neurology

## 2023-06-04 ENCOUNTER — Other Ambulatory Visit: Payer: Self-pay | Admitting: Neurology

## 2023-06-04 LAB — COPPER, SERUM: Copper: 70 ug/dL — ABNORMAL LOW (ref 80–158)

## 2023-06-04 LAB — B12 AND FOLATE PANEL
Folate: 11.2 ng/mL (ref >3.0)
Vitamin B-12: 827 pg/mL (ref 232–1245)

## 2023-06-04 MED ORDER — COPPER 2 MG PO TABS: 0 refills | Status: DC

## 2023-06-05 MED ORDER — COPPER 2 MG PO TABS: 0 refills | Status: AC

## 2023-06-10 ENCOUNTER — Telehealth: Payer: Self-pay | Admitting: Cardiology

## 2023-06-10 ENCOUNTER — Ambulatory Visit (INDEPENDENT_AMBULATORY_CARE_PROVIDER_SITE_OTHER): Payer: No Typology Code available for payment source

## 2023-06-10 DIAGNOSIS — I442 Atrioventricular block, complete: Secondary | ICD-10-CM

## 2023-06-10 LAB — CUP PACEART REMOTE DEVICE CHECK
Battery Remaining Longevity: 156 mo
Battery Remaining Percentage: 100 %
Brady Statistic RA Percent Paced: 48 %
Brady Statistic RV Percent Paced: 0 %
Date Time Interrogation Session: 20250115043000
Implantable Lead Connection Status: 753985
Implantable Lead Connection Status: 753985
Implantable Lead Implant Date: 20230717
Implantable Lead Implant Date: 20230717
Implantable Lead Location: 753859
Implantable Lead Location: 753860
Implantable Lead Model: 7841
Implantable Lead Model: 7842
Implantable Lead Serial Number: 1204466
Implantable Lead Serial Number: 1298900
Implantable Pulse Generator Implant Date: 20230717
Lead Channel Impedance Value: 557 Ohm
Lead Channel Impedance Value: 761 Ohm
Lead Channel Pacing Threshold Amplitude: 0.7 V
Lead Channel Pacing Threshold Pulse Width: 0.4 ms
Lead Channel Setting Pacing Amplitude: 2.5 V
Lead Channel Setting Pacing Amplitude: 3.3 V
Lead Channel Setting Pacing Pulse Width: 1 ms
Lead Channel Setting Sensing Sensitivity: 1 mV
Pulse Gen Serial Number: 603605
Zone Setting Status: 755011

## 2023-06-10 NOTE — Telephone Encounter (Signed)
 Patient is concern of for decrease urine even in the presence of diuretic:  Will get blood work.

## 2023-06-11 ENCOUNTER — Other Ambulatory Visit: Payer: Self-pay

## 2023-06-11 ENCOUNTER — Encounter (INDEPENDENT_AMBULATORY_CARE_PROVIDER_SITE_OTHER): Payer: Self-pay | Admitting: Internal Medicine

## 2023-06-11 ENCOUNTER — Ambulatory Visit (INDEPENDENT_AMBULATORY_CARE_PROVIDER_SITE_OTHER): Payer: No Typology Code available for payment source | Admitting: Internal Medicine

## 2023-06-11 VITALS — BP 120/77 | HR 60 | Temp 97.8°F | Ht 63.0 in | Wt 245.0 lb

## 2023-06-11 DIAGNOSIS — F5089 Other specified eating disorder: Secondary | ICD-10-CM

## 2023-06-11 DIAGNOSIS — R5383 Other fatigue: Secondary | ICD-10-CM

## 2023-06-11 DIAGNOSIS — R0602 Shortness of breath: Secondary | ICD-10-CM

## 2023-06-11 DIAGNOSIS — Z6841 Body Mass Index (BMI) 40.0 and over, adult: Secondary | ICD-10-CM

## 2023-06-11 DIAGNOSIS — E639 Nutritional deficiency, unspecified: Secondary | ICD-10-CM

## 2023-06-11 DIAGNOSIS — K760 Fatty (change of) liver, not elsewhere classified: Secondary | ICD-10-CM

## 2023-06-11 DIAGNOSIS — I10 Essential (primary) hypertension: Secondary | ICD-10-CM

## 2023-06-11 DIAGNOSIS — G4733 Obstructive sleep apnea (adult) (pediatric): Secondary | ICD-10-CM | POA: Diagnosis not present

## 2023-06-11 DIAGNOSIS — Z1331 Encounter for screening for depression: Secondary | ICD-10-CM

## 2023-06-11 DIAGNOSIS — E66813 Obesity, class 3: Secondary | ICD-10-CM

## 2023-06-11 DIAGNOSIS — Z79899 Other long term (current) drug therapy: Secondary | ICD-10-CM

## 2023-06-11 DIAGNOSIS — Z9884 Bariatric surgery status: Secondary | ICD-10-CM

## 2023-06-11 DIAGNOSIS — F509 Eating disorder, unspecified: Secondary | ICD-10-CM | POA: Insufficient documentation

## 2023-06-11 NOTE — Assessment & Plan Note (Signed)
History of Roux-en-Y with history of binge eating disorder now somewhat of a restrictive eating disorder following Roux-en-Y.  She will work on distributing calories throughout the day, avoiding skipping meals and maintaining a reduced calorie state of 1200 cal/day.  Meal plan provided.  She is also to increase her protein intake to 30 to 40 g of protein per meal.  We also discussed the importance of increasing physical activity levels with specific goals.  She does not necessarily benefit from antiobesity medications at present time due to the restrictive nature of eating.  We will consider referral to behavioral health specialist if through nutritional approaches her eating tendencies do not change as this may be hormonal in origin due to her eating pattern and metabolic adaptations associated with Roux-en-Y.
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
## 2023-06-11 NOTE — Assessment & Plan Note (Signed)
Patient has not been taking a bariatric vitamin she was recently found to have copper, vitamin D and possibly B12 deficiencies.  Her ferritin level was also low.  I will like to do a bariatric panel after holding supplements for 2 days to get a baseline.  We will then recommend starting a bariatric vitamin at that time.  She was made aware of the lifelong need to supplement to reduce the risk of long-term complications associated post Roux-en-Y.  This may also have an effect on her metabolic rate.

## 2023-06-11 NOTE — Assessment & Plan Note (Signed)
As above.

## 2023-06-11 NOTE — Assessment & Plan Note (Signed)
Blood pressure at goal for age and risk category.  On metoprolol and hydrochlorothiazide  Most recent renal parameters reviewed which showed normal electrolytes and kidney function.  We are checking renal parameters today.  Continue current regimen  Start weight loss program. Losing 10% may improve blood pressure control. Monitor for symptoms of orthostasis while losing weight.

## 2023-06-11 NOTE — Progress Notes (Signed)
Lab orders placed per Dr. Mallory Shirk request ?

## 2023-06-11 NOTE — Assessment & Plan Note (Signed)
Patient reports having history of hepatic steatosis with elevated liver enzymes.  Most recent liver enzymes are normal I also reviewed most recent abdominal imaging including CT scans and ultrasounds which did not show the presence of hepatic steatosis.  This likely has improved following her Roux-en-Y.  She will continue to work on reducing saturated fats and simple sugars in her diet.  Losing to 15% of her body weight will reduce the risk of fatty liver.

## 2023-06-11 NOTE — Assessment & Plan Note (Addendum)
[name] 2012, presurgical weigth 300 lbs, nadir 218 3-4 months, started weight regain in 2017 after menopause.  She is not taking bariatric vitamins and has been recently found to have some nutritional deficiencies.  Because of the number of tubes I will defer vitamin level check to the next office visit.  As we are obtaining some baseline labs today.  Patient advised to hold supplements 2 days prior to blood work.  She would likely benefit from taking a daily bariatric vitamin but I would like to check her baseline levels first.

## 2023-06-11 NOTE — Assessment & Plan Note (Signed)
On CPAP with reported good compliance. Continue PAP therapy. Losing 15% or more of body weight may improve AHI.    

## 2023-06-12 LAB — CBC WITH DIFFERENTIAL/PLATELET
Basophils Absolute: 0.1 10*3/uL (ref 0.0–0.2)
Basos: 1 %
EOS (ABSOLUTE): 0.1 10*3/uL (ref 0.0–0.4)
Eos: 1 %
Hematocrit: 43.2 % (ref 34.0–46.6)
Hemoglobin: 13.6 g/dL (ref 11.1–15.9)
Immature Grans (Abs): 0 10*3/uL (ref 0.0–0.1)
Immature Granulocytes: 0 %
Lymphocytes Absolute: 3.8 10*3/uL — ABNORMAL HIGH (ref 0.7–3.1)
Lymphs: 49 %
MCH: 28.3 pg (ref 26.6–33.0)
MCHC: 31.5 g/dL (ref 31.5–35.7)
MCV: 90 fL (ref 79–97)
Monocytes Absolute: 0.4 10*3/uL (ref 0.1–0.9)
Monocytes: 6 %
Neutrophils Absolute: 3.4 10*3/uL (ref 1.4–7.0)
Neutrophils: 43 %
Platelets: 509 10*3/uL — ABNORMAL HIGH (ref 150–450)
RBC: 4.8 x10E6/uL (ref 3.77–5.28)
RDW: 13.3 % (ref 11.7–15.4)
WBC: 7.8 10*3/uL (ref 3.4–10.8)

## 2023-06-12 LAB — COMPREHENSIVE METABOLIC PANEL
ALT: 25 [IU]/L (ref 0–32)
AST: 30 [IU]/L (ref 0–40)
Albumin: 4.5 g/dL (ref 3.8–4.9)
Alkaline Phosphatase: 156 [IU]/L — ABNORMAL HIGH (ref 44–121)
BUN/Creatinine Ratio: 20 (ref 9–23)
BUN: 11 mg/dL (ref 6–24)
Bilirubin Total: 0.3 mg/dL (ref 0.0–1.2)
CO2: 24 mmol/L (ref 20–29)
Calcium: 9 mg/dL (ref 8.7–10.2)
Chloride: 98 mmol/L (ref 96–106)
Creatinine, Ser: 0.56 mg/dL — ABNORMAL LOW (ref 0.57–1.00)
Globulin, Total: 2.9 g/dL (ref 1.5–4.5)
Glucose: 83 mg/dL (ref 70–99)
Potassium: 4.4 mmol/L (ref 3.5–5.2)
Sodium: 139 mmol/L (ref 134–144)
Total Protein: 7.4 g/dL (ref 6.0–8.5)
eGFR: 106 mL/min/{1.73_m2} (ref >59)

## 2023-06-12 LAB — INSULIN, RANDOM: INSULIN: 14.9 u[IU]/mL (ref 2.6–24.9)

## 2023-06-12 LAB — LIPID PANEL WITH LDL/HDL RATIO
Cholesterol, Total: 203 mg/dL — ABNORMAL HIGH (ref 100–199)
HDL: 65 mg/dL (ref >39)
LDL Chol Calc (NIH): 126 mg/dL — ABNORMAL HIGH (ref 0–99)
LDL/HDL Ratio: 1.9 {ratio} (ref 0.0–3.2)
Triglycerides: 64 mg/dL (ref 0–149)
VLDL Cholesterol Cal: 12 mg/dL (ref 5–40)

## 2023-06-12 LAB — HEMOGLOBIN A1C
Est. average glucose Bld gHb Est-mCnc: 126 mg/dL
Hgb A1c MFr Bld: 6 % — ABNORMAL HIGH (ref 4.8–5.6)

## 2023-06-12 LAB — TSH: TSH: 1.91 u[IU]/mL (ref 0.450–4.500)

## 2023-06-17 ENCOUNTER — Other Ambulatory Visit: Payer: Self-pay | Admitting: *Deleted

## 2023-06-17 DIAGNOSIS — D508 Other iron deficiency anemias: Secondary | ICD-10-CM

## 2023-06-17 DIAGNOSIS — D75839 Thrombocytosis, unspecified: Secondary | ICD-10-CM

## 2023-06-18 ENCOUNTER — Inpatient Hospital Stay (HOSPITAL_BASED_OUTPATIENT_CLINIC_OR_DEPARTMENT_OTHER): Payer: No Typology Code available for payment source | Admitting: Hematology & Oncology

## 2023-06-18 ENCOUNTER — Other Ambulatory Visit: Payer: Self-pay

## 2023-06-18 ENCOUNTER — Encounter: Payer: Self-pay | Admitting: Hematology & Oncology

## 2023-06-18 ENCOUNTER — Telehealth: Payer: Self-pay | Admitting: Cardiology

## 2023-06-18 ENCOUNTER — Inpatient Hospital Stay: Payer: No Typology Code available for payment source | Attending: Hematology & Oncology

## 2023-06-18 VITALS — BP 123/66 | HR 59 | Temp 98.2°F | Resp 18 | Ht 63.0 in | Wt 249.0 lb

## 2023-06-18 DIAGNOSIS — K909 Intestinal malabsorption, unspecified: Secondary | ICD-10-CM | POA: Diagnosis not present

## 2023-06-18 DIAGNOSIS — D509 Iron deficiency anemia, unspecified: Secondary | ICD-10-CM | POA: Diagnosis present

## 2023-06-18 DIAGNOSIS — D75839 Thrombocytosis, unspecified: Secondary | ICD-10-CM | POA: Insufficient documentation

## 2023-06-18 DIAGNOSIS — Z9884 Bariatric surgery status: Secondary | ICD-10-CM | POA: Diagnosis not present

## 2023-06-18 DIAGNOSIS — D508 Other iron deficiency anemias: Secondary | ICD-10-CM

## 2023-06-18 DIAGNOSIS — R1013 Epigastric pain: Secondary | ICD-10-CM

## 2023-06-18 DIAGNOSIS — G8929 Other chronic pain: Secondary | ICD-10-CM | POA: Diagnosis not present

## 2023-06-18 LAB — SAVE SMEAR(SSMR), FOR PROVIDER SLIDE REVIEW

## 2023-06-18 LAB — CBC WITH DIFFERENTIAL (CANCER CENTER ONLY)
Abs Immature Granulocytes: 0.03 10*3/uL (ref 0.00–0.07)
Basophils Absolute: 0.1 10*3/uL (ref 0.0–0.1)
Basophils Relative: 1 %
Eosinophils Absolute: 0.1 10*3/uL (ref 0.0–0.5)
Eosinophils Relative: 1 %
HCT: 40.2 % (ref 36.0–46.0)
Hemoglobin: 13.3 g/dL (ref 12.0–15.0)
Immature Granulocytes: 0 %
Lymphocytes Relative: 52 %
Lymphs Abs: 4.6 10*3/uL — ABNORMAL HIGH (ref 0.7–4.0)
MCH: 29.2 pg (ref 26.0–34.0)
MCHC: 33.1 g/dL (ref 30.0–36.0)
MCV: 88.4 fL (ref 80.0–100.0)
Monocytes Absolute: 0.6 10*3/uL (ref 0.1–1.0)
Monocytes Relative: 7 %
Neutro Abs: 3.4 10*3/uL (ref 1.7–7.7)
Neutrophils Relative %: 39 %
Platelet Count: 418 10*3/uL — ABNORMAL HIGH (ref 150–400)
RBC: 4.55 MIL/uL (ref 3.87–5.11)
RDW: 13.2 % (ref 11.5–15.5)
WBC Count: 8.8 10*3/uL (ref 4.0–10.5)
nRBC: 0 % (ref 0.0–0.2)

## 2023-06-18 LAB — IRON AND IRON BINDING CAPACITY (CC-WL,HP ONLY)
Iron: 107 ug/dL (ref 28–170)
Saturation Ratios: 27 % (ref 10.4–31.8)
TIBC: 402 ug/dL (ref 250–450)
UIBC: 295 ug/dL (ref 148–442)

## 2023-06-18 LAB — COMPREHENSIVE METABOLIC PANEL
ALT: 18 U/L (ref 0–44)
AST: 22 U/L (ref 15–41)
Albumin: 4.5 g/dL (ref 3.5–5.0)
Alkaline Phosphatase: 123 U/L (ref 38–126)
Anion gap: 9 (ref 5–15)
BUN: 16 mg/dL (ref 6–20)
CO2: 30 mmol/L (ref 22–32)
Calcium: 9.4 mg/dL (ref 8.9–10.3)
Chloride: 100 mmol/L (ref 98–111)
Creatinine, Ser: 0.69 mg/dL (ref 0.44–1.00)
GFR, Estimated: >60 mL/min (ref >60)
Glucose, Bld: 92 mg/dL (ref 70–99)
Potassium: 3.8 mmol/L (ref 3.5–5.1)
Sodium: 139 mmol/L (ref 135–145)
Total Bilirubin: 0.5 mg/dL (ref 0.0–1.2)
Total Protein: 7.1 g/dL (ref 6.5–8.1)

## 2023-06-18 LAB — FERRITIN: Ferritin: 16 ng/mL (ref 11–307)

## 2023-06-18 LAB — RETICULOCYTES
Immature Retic Fract: 14.1 % (ref 2.3–15.9)
RBC.: 4.45 MIL/uL (ref 3.87–5.11)
Retic Count, Absolute: 88.6 10*3/uL (ref 19.0–186.0)
Retic Ct Pct: 2 % (ref 0.4–3.1)

## 2023-06-18 LAB — LACTATE DEHYDROGENASE: LDH: 218 U/L — ABNORMAL HIGH (ref 98–192)

## 2023-06-18 NOTE — Telephone Encounter (Signed)
Called the patient because she requested that I review her lab work. She tells me she is experiencing shortness of breath, increasing weight and abdominal swelling I have asked her to take her Lasix 40 mg daily for the next 3 days. I will add her to the schedule for Wed Jan 29.

## 2023-06-18 NOTE — Progress Notes (Signed)
Hematology and Oncology Follow Up Visit  Chandni Dario [id number] 10/30/1965 58 y.o. 06/18/2023   Principle Diagnosis:  Thrombocytosis  - JAK2 (-)/ DNMT3A (+) Family history of lymphoma (father) Iron deficiency anemia secondary to malabsorption with bariatric surgery (2012)    Current Therapy:        Observation IV iron as indicated -- Feraheme given on 04/27/2018   Interim History:  Ms. Zunino is here today for follow-up we last saw her back in July.  Everything's been going okay.  Her main problem has been this epigastric discomfort.  She has had this before.  She feels symptoms going on.  She has had no nausea or vomiting.  Think she is being followed in the weight loss clinic now.  She has had no change in bowel or bladder habits.  There is been no rashes.  She has had no bleeding.  She has had no cough or shortness of breath.  Her last iron studies that were done back in July showed a ferritin of 17 with an iron saturation of 32%.  Overall, she has had no problems with COVID.  There is been no leg swelling.  Overall, I would say performance status about ECOG 1..     Wt Readings from Last 3 Encounters:  06/18/23 249 lb (112.9 kg)  06/11/23 245 lb (111.1 kg)  06/02/23 248 lb (112.5 kg)     Medications:  Allergies as of 06/18/2023       Reactions   Penicillins Other (See Comments)   Causes severe, difficult to treat yeast infections        Medication List        Accurate as of June 18, 2023 10:35 AM. If you have any questions, ask your nurse or doctor.          acetaminophen 325 MG tablet Commonly known as: TYLENOL Take 1-2 tablets (325-650 mg total) by mouth every 4 (four) hours as needed for mild pain.   BD SafetyGlide Syringe/Needle 25G X 1" 3 ML Misc Generic drug: SYRINGE-NEEDLE (DISP) 3 ML Use for B12 injections   BIOFREEZE EX Apply 1 application  topically daily as needed (On Knees and feet).   Biotin 1000 MCG tablet Take 1,000  mcg by mouth every other day.   celecoxib 200 MG capsule Commonly known as: CELEBREX TAKE 1CAP BY MOUTH DAILY AS NEEDED FOR MILD PAIN (PAIN SCORE 1-3)/MODERATE PAIN (PAIN SCORE 4-6).   cetirizine 10 MG tablet Commonly known as: ZYRTEC Take 10 mg by mouth daily.   COLLAGEN PO Take by mouth. Liquid collagen with biotin   copper tablet Take 3 tablets (6 mg total) by mouth daily for 7 days, THEN 2 tablets (4 mg total) daily for 7 days, THEN 1 tablet (2 mg total) daily. Start taking on: June 05, 2023   cyanocobalamin 1000 MCG/ML injection Commonly known as: VITAMIN B12 Inject 1 ml into the muscle once a week for a month.  Then inject 1 ml into the muscle once a month thereafter.   famotidine 40 MG tablet Commonly known as: PEPCID Take 40 mg by mouth 2 (two) times daily.   fluticasone 50 MCG/ACT nasal spray Commonly known as: FLONASE Place 2 sprays into both nostrils daily.   furosemide 40 MG tablet Commonly known as: LASIX TAKE ONE TABLET EVERY 2 WEEKS. TAKE WITH POTASSIUM 20 MEQ   GLUCOSAMINE PO Take 1,000 mg by mouth daily.   hydrochlorothiazide 25 MG tablet Commonly known as: HYDRODIURIL TAKE 1 TABLET (25  MG TOTAL) BY MOUTH DAILY.   Ibuprofen 200 MG Caps Take 400 mg by mouth daily as needed (For pain).   Klor-Con M20 20 MEQ tablet Generic drug: potassium chloride SA Take One tablet Every 2 weeks. Take with Lasix 40 mg   levothyroxine 88 MCG tablet Commonly known as: SYNTHROID TAKE 1 TABLET BY MOUTH EVERY DAY   loratadine 10 MG tablet Commonly known as: CLARITIN TAKE 1 TABLET BY MOUTH EVERY DAY   Magnesium 250 MG Tabs Take 420 mg by mouth daily. For leg cramps   metoprolol succinate 25 MG 24 hr tablet Commonly known as: Toprol XL Take 1 tablet (25 mg total) by mouth 2 (two) times daily.   rosuvastatin 5 MG tablet Commonly known as: Crestor Take 1 tablet (5 mg total) by mouth at bedtime.   Turmeric 500 MG Caps Take 500 mg by mouth as needed. 3 times  weekly, 1 tab po   valsartan 160 MG tablet Commonly known as: DIOVAN Take 1 tablet (160 mg total) by mouth daily.   Vitamin D3 50 MCG (2000 UT) Tabs Take 2,000 Units by mouth daily.   YumVs Beet Root-Tart Cherry 250-0.5 MG Chew Chew 1 tablet by mouth 2 (two) times a week.        Allergies:  Allergies  Allergen Reactions   Penicillins Other (See Comments)    Causes severe, difficult to treat yeast infections    Past Medical History, Surgical history, Social history, and Family History were reviewed and updated.  Review of Systems: Review of Systems  Constitutional: Negative.   HENT: Negative.    Eyes: Negative.   Respiratory: Negative.    Cardiovascular: Negative.   Gastrointestinal: Negative.   Genitourinary: Negative.   Musculoskeletal: Negative.   Skin: Negative.   Neurological: Negative.   Endo/Heme/Allergies: Negative.   Psychiatric/Behavioral: Negative.       Physical Exam:  height is 5\' 3"  (1.6 m) and weight is 249 lb (112.9 kg). Her oral temperature is 98.2 F (36.8 C). Her blood pressure is 123/66 and her pulse is 59 (abnormal). Her respiration is 18 and oxygen saturation is 100%.   Wt Readings from Last 3 Encounters:  06/18/23 249 lb (112.9 kg)  06/11/23 245 lb (111.1 kg)  06/02/23 248 lb (112.5 kg)    Physical Exam Vitals reviewed.  HENT:     Head: Normocephalic and atraumatic.  Eyes:     Pupils: Pupils are equal, round, and reactive to light.  Cardiovascular:     Rate and Rhythm: Normal rate and regular rhythm.     Heart sounds: Normal heart sounds.  Pulmonary:     Effort: Pulmonary effort is normal.     Breath sounds: Normal breath sounds.  Abdominal:     General: Bowel sounds are normal.     Palpations: Abdomen is soft.  Musculoskeletal:        General: No tenderness or deformity. Normal range of motion.     Cervical back: Normal range of motion.  Lymphadenopathy:     Cervical: No cervical adenopathy.  Skin:    General: Skin is  warm and dry.     Findings: No erythema or rash.  Neurological:     Mental Status: She is alert and oriented to person, place, and time.  Psychiatric:        Behavior: Behavior normal.        Thought Content: Thought content normal.        Judgment: Judgment normal.     Lab  Results  Component Value Date   WBC 8.8 06/18/2023   HGB 13.3 06/18/2023   HCT 40.2 06/18/2023   MCV 88.4 06/18/2023   PLT 418 (H) 06/18/2023   Lab Results  Component Value Date   FERRITIN 17 12/16/2022   IRON 115 12/16/2022   TIBC 363 12/16/2022   UIBC 248 12/16/2022   IRONPCTSAT 32 (H) 12/16/2022   Lab Results  Component Value Date   RETICCTPCT 2.0 06/18/2023   RBC 4.45 06/18/2023   RETICCTABS 79.2 04/19/2014   No results found for: "KPAFRELGTCHN", "LAMBDASER", "KAPLAMBRATIO" No results found for: "IGGSERUM", "IGA", "IGMSERUM" No results found for: "TOTALPROTELP", "ALBUMINELP", "A1GS", "A2GS", "BETS", "BETA2SER", "GAMS", "MSPIKE", "SPEI"   Chemistry      Component Value Date/Time   NA 139 06/18/2023 0902   NA 139 06/11/2023 1304   NA 138 04/27/2017 0933   K 3.8 06/18/2023 0902   K 3.3 (L) 04/27/2017 0933   CL 100 06/18/2023 0902   CO2 30 06/18/2023 0902   CO2 26 04/27/2017 0933   BUN 16 06/18/2023 0902   BUN 11 06/11/2023 1304   BUN 12.2 04/27/2017 0933   CREATININE 0.69 06/18/2023 0902   CREATININE 0.64 12/16/2022 0931   CREATININE 0.7 04/27/2017 0933   GLU 81 08/29/2019 0000      Component Value Date/Time   CALCIUM 9.4 06/18/2023 0902   CALCIUM 9.1 04/27/2017 0933   ALKPHOS 123 06/18/2023 0902   ALKPHOS 139 04/27/2017 0933   AST 22 06/18/2023 0902   AST 23 12/16/2022 0931   AST 25 04/27/2017 0933   ALT 18 06/18/2023 0902   ALT 19 12/16/2022 0931   ALT 19 04/27/2017 0933   BILITOT 0.5 06/18/2023 0902   BILITOT 0.3 06/11/2023 1304   BILITOT 0.4 12/16/2022 0931   BILITOT 0.58 04/27/2017 0933      Impression and Plan: Ms. Asare is a very pleasant 58 yo African American  female with history of mild thrombocytosis as well as intermittent iron deficiency anemia secondary to malabsorption.  Her blood counts coming down nicely.  We will see what her iron studies look like.  Not sure what is going on with her abdomen.  A CT scan will certainly help [place] out.  Will try to get this CT scan done tomorrow.  We will still plan to see her back in about 6 months.  If there is nothing on the CT scan that looks troublesome, then we will definitely make the referrals that we have to.   Josph Macho, MD 1/23/202510:35 AM

## 2023-06-19 ENCOUNTER — Other Ambulatory Visit: Payer: Self-pay | Admitting: Hematology & Oncology

## 2023-06-19 ENCOUNTER — Encounter: Payer: Self-pay | Admitting: Family

## 2023-06-19 DIAGNOSIS — R140 Abdominal distension (gaseous): Secondary | ICD-10-CM

## 2023-06-22 ENCOUNTER — Encounter: Payer: Self-pay | Admitting: Family

## 2023-06-23 ENCOUNTER — Ambulatory Visit (HOSPITAL_BASED_OUTPATIENT_CLINIC_OR_DEPARTMENT_OTHER)
Admission: RE | Admit: 2023-06-23 | Discharge: 2023-06-23 | Disposition: A | Discharge status: Home / Self Care | Payer: No Typology Code available for payment source | Source: Ambulatory Visit | Attending: Hematology & Oncology | Admitting: Hematology & Oncology

## 2023-06-23 ENCOUNTER — Encounter: Payer: Self-pay | Admitting: *Deleted

## 2023-06-23 DIAGNOSIS — R140 Abdominal distension (gaseous): Secondary | ICD-10-CM | POA: Diagnosis present

## 2023-06-24 ENCOUNTER — Other Ambulatory Visit: Payer: Self-pay | Admitting: Internal Medicine

## 2023-06-24 ENCOUNTER — Ambulatory Visit: Payer: No Typology Code available for payment source | Attending: Cardiology | Admitting: Cardiology

## 2023-06-24 ENCOUNTER — Encounter: Payer: Self-pay | Admitting: Cardiology

## 2023-06-24 ENCOUNTER — Ambulatory Visit (INDEPENDENT_AMBULATORY_CARE_PROVIDER_SITE_OTHER): Payer: No Typology Code available for payment source

## 2023-06-24 VITALS — BP 122/74 | HR 60 | Ht 63.0 in | Wt 245.2 lb

## 2023-06-24 DIAGNOSIS — R0609 Other forms of dyspnea: Secondary | ICD-10-CM

## 2023-06-24 DIAGNOSIS — M2550 Pain in unspecified joint: Secondary | ICD-10-CM

## 2023-06-24 DIAGNOSIS — R600 Localized edema: Secondary | ICD-10-CM

## 2023-06-24 DIAGNOSIS — R195 Other fecal abnormalities: Secondary | ICD-10-CM

## 2023-06-24 DIAGNOSIS — Z79899 Other long term (current) drug therapy: Secondary | ICD-10-CM

## 2023-06-24 DIAGNOSIS — I493 Ventricular premature depolarization: Secondary | ICD-10-CM

## 2023-06-24 DIAGNOSIS — R935 Abnormal findings on diagnostic imaging of other abdominal regions, including retroperitoneum: Secondary | ICD-10-CM

## 2023-06-24 MED ORDER — POTASSIUM CHLORIDE CRYS ER 20 MEQ PO TBCR: 20.0000 meq | EXTENDED_RELEASE_TABLET | Freq: Every day | ORAL | 3 refills | Status: DC

## 2023-06-24 MED ORDER — FUROSEMIDE 40 MG PO TABS: 40.0000 mg | ORAL_TABLET | Freq: Every day | ORAL | 3 refills | Status: DC

## 2023-06-24 NOTE — Progress Notes (Unsigned)
Enrolled for [employer] to mail a ZIO XT long term holter monitor to the patients address on file.

## 2023-06-24 NOTE — Patient Instructions (Addendum)
Medication Instructions:  Your physician has recommended you make the following change in your medication:  START: Lasix 40 mg once daily START: Potassium 20 mEq once daily *If you need a refill on your cardiac medications before your next appointment, please call your pharmacy*   Lab Work: CMET, Mag If you have labs (blood work) drawn today and your tests are completely normal, you will receive your results only by: MyChart Message (if you have MyChart) OR A paper copy in the mail If you have any lab test that is abnormal or we need to change your treatment, we will call you to review the results.   Testing/Procedures: Your physician has requested that you have an echocardiogram. Echocardiography is a painless test that uses sound waves to create images of your heart. It provides your doctor with information about the size and shape of your heart and how well your heart's chambers and valves are working. This procedure takes approximately one hour. There are no restrictions for this procedure. Please do NOT wear cologne, perfume, aftershave, or lotions (deodorant is allowed). Please arrive 15 minutes prior to your appointment time.  Please note: We ask at that you not bring children with you during ultrasound (echo/ vascular) testing. Due to room size and safety concerns, children are not allowed in the ultrasound rooms during exams. Our front office staff cannot provide observation of children in our lobby area while testing is being conducted. An adult accompanying a patient to their appointment will only be allowed in the ultrasound room at the discretion of the ultrasound technician under special circumstances. We apologize for any inconvenience.  ZIO XT- Long Term Monitor Instructions  Your physician has requested you wear a ZIO patch monitor for 14 days.  This is a single patch monitor. [employer] supplies one patch monitor per enrollment. Additional stickers are not available. Please  do not apply patch if you will be having a Nuclear Stress Test,  Echocardiogram, Cardiac CT, MRI, or Chest Xray during the period you would be wearing the  monitor. The patch cannot be worn during these tests. You cannot remove and re-apply the  ZIO XT patch monitor.  Your ZIO patch monitor will be mailed 3 day USPS to your address on file. It may take 3-5 days  to receive your monitor after you have been enrolled.  Once you have received your monitor, please review the enclosed instructions. Your monitor  has already been registered assigning a specific monitor serial # to you.  Billing and Patient Assistance Program Information  We have supplied [employer] with any of your insurance information on file for billing purposes. [employer] offers a sliding scale Patient Assistance Program for patients that do not have  insurance, or whose insurance does not completely cover the cost of the ZIO monitor.  You must apply for the Patient Assistance Program to qualify for this discounted rate.  To apply, please call [employer] at [phone], select option 4, select option 2, ask to apply for  Patient Assistance Program. Meredeth Ide will ask your household income, and how many people  are in your household. They will quote your out-of-pocket cost based on that information.  [employer] will also be able to set up a 28-month, interest-free payment plan if needed.  Applying the monitor   Shave hair from upper left chest.  Hold abrader disc by orange tab. Rub abrader in 40 strokes over the upper left chest as  indicated in your monitor instructions.  Clean area with 4 enclosed  alcohol pads. Let dry.  Apply patch as indicated in monitor instructions. Patch will be placed under collarbone on left  side of chest with arrow pointing upward.  Rub patch adhesive wings for 2 minutes. Remove white label marked "1". Remove the white  label marked "2". Rub patch adhesive wings for 2 additional minutes.  While looking in a  mirror, press and release button in center of patch. A small green light will  flash 3-4 times. This will be your only indicator that the monitor has been turned on.  Do not shower for the first 24 hours. You may shower after the first 24 hours.  Press the button if you feel a symptom. You will hear a small click. Record Date, Time and  Symptom in the Patient Logbook.  When you are ready to remove the patch, follow instructions on the last 2 pages of Patient  Logbook. Stick patch monitor onto the last page of Patient Logbook.  Place Patient Logbook in the blue and white box. Use locking tab on box and tape box closed  securely. The blue and white box has prepaid postage on it. Please place it in the mailbox as  soon as possible. Your physician should have your test results approximately 7 days after the  monitor has been mailed back to [facility].  Call [facility] Customer Care at [phone] if you have questions regarding  your ZIO XT patch monitor. Call them immediately if you see an orange light blinking on your  monitor.  If your monitor falls off in less than 4 days, contact our Monitor department at [phone].  If your monitor becomes loose or falls off after 4 days call [employer] at [phone] for  suggestions on securing your monitor   Follow-Up: At [facility], you and your health needs are our priority.  As part of our continuing mission to provide you with exceptional heart care, we have created designated Provider Care Teams.  These Care Teams include your primary Cardiologist (physician) and Advanced Practice Providers (APPs -  Physician Assistants and Nurse Practitioners) who all work together to provide you with the care you need, when you need it.  Your next appointment:   12 week(s)  Provider:   Thomasene Ripple, DO     Other Instructions:

## 2023-06-24 NOTE — Progress Notes (Signed)
Cardiology Office Note:    Date:  06/24/2023   ID:  Larey Dresser, DOB 1966-01-28, MRN [id number]  PCP:  Philip Aspen, Limmie Patricia, MD  Cardiologist:  Thomasene Ripple, DO  Electrophysiologist:  None   Referring MD: Philip Aspen, Estel*   " I am ok"  History of Present Illness:    [name] is a 58 y.o. female with a hx of mixed connective tissue disorder, intermittent AV block who underwent PPM insertion complicated by lead migration and asymptomatic perforation-she underwent insertion of a new lead, PAF which was recently found on her ppm interrogation, COPD, mild thrombocytosis, iron deficiency anemia, secondary to absorption after bariatric surgery in 2012, concern for mixed connective tissue disorder, sleep apnea no longer on CPAP, hypertension, irritable bowel syndrome and obesity.    At her last visit she was experiencing increased PVCs, her Toprol xl was increased to twice daily. We talked about her MRI done by neurology. I also started Lasix on the patient due to increased leg swelling. She has been taking the Lasix every two weeks but unfortunately her LE edema continues to worsened.   Since her visit with me she had had worsening leg edema  and increased palpations. I started the patient on the Lasix 40 mg daily.   She reports some improvement with LE edema and truncal edema since taking the Lasix but still  reports shortness of breath and tiredness. She shares that she has been experiencing PVCs. The patient also mentions a burning sensation due to acid reflux, which she is managing with famotidine twice daily.  Of note she has a US of the abdomen reporting  steatosis, but what bothers here is the fat that she is also experiencing loose stools now for a while.    Past Medical History:  Diagnosis Date   A-fib [facility])    Allergic rhinitis, cause unspecified    Allergy 2016   Anemia, iron deficiency    Arthritis    Back pain    CFS (chronic fatigue syndrome)     Chest pain    COVID-19 06/15/2020   Diastolic dysfunction    Diverticulosis of colon    Edema, lower extremity    Fatty liver    Gallbladder problem    GERD (gastroesophageal reflux disease)    Goiter    Hiatal hernia    Hypertension    Hypothyroidism    IBS (irritable bowel syndrome)    Joint pain    Morbid obesity (HCC)    Obstructive sleep apnea    Osteoarthritis    Pacemaker    Sleep apnea    SOB (shortness of breath)    Thrombocytosis    Undiagnosed cardiac murmurs    Vitamin B 12 deficiency    Vitamin D deficiency     Past Surgical History:  Procedure Laterality Date   BARIATRIC SURGERY  06/17/2010   BILATERAL SALPINGECTOMY Bilateral 06/22/2014   Procedure: BILATERAL SALPINGECTOMY;  Surgeon: Jaymes Graff, MD;  Location: [facility];  Service: Gynecology;  Laterality: Bilateral;   CYSTOSCOPY N/A 06/22/2014   Procedure: CYSTOSCOPY;  Surgeon: Jaymes Graff, MD;  Location: [facility];  Service: Gynecology;  Laterality: N/A;   ENDOMETRIAL ABLATION  04/2007   FOOT SURGERY Left 11/24/2017   Dr Charlsie Merles   LAPAROSCOPIC ASSISTED VAGINAL HYSTERECTOMY N/A 06/22/2014   Procedure: LAPAROSCOPIC ASSISTED VAGINAL HYSTERECTOMY;  Surgeon: Jaymes Graff, MD;  Location: [facility];  Service: Gynecology;  Laterality: N/A;   LEAD REVISION/REPAIR N/A 12/17/2021   Procedure: LEAD  REVISION/REPAIR;  Surgeon: Marinus Maw, MD;  Location: [facility] INVAS[facility] CV LAB;  Service: Cardiovascular;  Laterality: N/A;   LIPOMA EXCISION     upper back   PACEMAKER IMPLANT N/A 12/09/2021   Procedure: PACEMAKER IMPLANT;  Surgeon: Marinus Maw, MD;  Location: [facility] INVASIVE CV LAB;  Service: Cardiovascular;  Laterality: N/A;   SPINE SURGERY  02/10/2012   L4-l5 fusion--cohen    Current Medications: Current Meds  Medication Sig   acetaminophen (TYLENOL) 325 MG tablet Take 1-2 tablets (325-650 mg total) by mouth every 4 (four) hours as needed for mild pain.   Biotin 1000 MCG tablet Take 1,000 mcg by mouth every other  day.   celecoxib (CELEBREX) 200 MG capsule TAKE 1 CAPSULE BY MOUTH DAILY AS NEEDED FOR PAIN   cetirizine (ZYRTEC) 10 MG tablet Take 10 mg by mouth daily.   Cholecalciferol (VITAMIN D3) 50 MCG (2000 UT) TABS Take 2,000 Units by mouth daily.   COLLAGEN PO Take by mouth. Liquid collagen with biotin   copper tablet Take 3 tablets (6 mg total) by mouth daily for 7 days, THEN 2 tablets (4 mg total) daily for 7 days, THEN 1 tablet (2 mg total) daily.   cyanocobalamin (VITAMIN B12) 1000 MCG/ML injection Inject 1 ml into the muscle once a week for a month.  Then inject 1 ml into the muscle once a month thereafter.   famotidine (PEPCID) 40 MG tablet Take 40 mg by mouth 2 (two) times daily.   fluticasone (FLONASE) 50 MCG/ACT nasal spray Place 2 sprays into both nostrils daily.   furosemide (LASIX) 40 MG tablet Take 1 tablet (40 mg total) by mouth daily.   Glucosamine HCl (GLUCOSAMINE PO) Take 1,000 mg by mouth daily.   hydrochlorothiazide (HYDRODIURIL) 25 MG tablet TAKE 1 TABLET (25 MG TOTAL) BY MOUTH DAILY.   Ibuprofen 200 MG CAPS Take 400 mg by mouth daily as needed (For pain).   levothyroxine (SYNTHROID) 88 MCG tablet TAKE 1 TABLET BY MOUTH EVERY DAY   loratadine (CLARITIN) 10 MG tablet TAKE 1 TABLET BY MOUTH EVERY DAY   Magnesium 250 MG TABS Take 420 mg by mouth daily. For leg cramps   Menthol, Topical Analgesic, (BIOFREEZE EX) Apply 1 application  topically daily as needed (On Knees and feet).   metoprolol succinate (TOPROL XL) 25 MG 24 hr tablet Take 1 tablet (25 mg total) by mouth 2 (two) times daily.   Misc Natural Products (YUMVS BEET ROOT-TART CHERRY) 250-0.5 MG CHEW Chew 1 tablet by mouth 2 (two) times a week.   potassium chloride SA (KLOR-CON M20) 20 MEQ tablet Take 1 tablet (20 mEq total) by mouth daily.   rosuvastatin (CRESTOR) 5 MG tablet Take 1 tablet (5 mg total) by mouth at bedtime.   SYRINGE-NEEDLE, DISP, 3 ML (BD SAFETYGLIDE SYRINGE/NEEDLE) 25G X 1" 3 ML MISC Use for B12 injections    Turmeric 500 MG CAPS Take 500 mg by mouth as needed. 3 times weekly, 1 tab po   valsartan (DIOVAN) 160 MG tablet Take 1 tablet (160 mg total) by mouth daily.   [DISCONTINUED] furosemide (LASIX) 40 MG tablet TAKE ONE TABLET EVERY 2 WEEKS. TAKE WITH POTASSIUM 20 MEQ   [DISCONTINUED] KLOR-CON M20 20 MEQ tablet Take One tablet Every 2 weeks. Take with Lasix 40 mg     Allergies:   Penicillins   Social History   Socioeconomic History   Marital status: Married    Spouse name: Earl Lites   Number of children: 3  Years of education: Some college   Highest education level: 12th grade  Occupational History   Occupation: Herbalist: UNEMPLOYED   Occupation: stay at home mom  Tobacco Use   Smoking status: Never    Passive exposure: Never   Smokeless tobacco: Never   Tobacco comments:    Never Used Tobacco  Vaping Use   Vaping status: Never Used  Substance and Sexual Activity   Alcohol use: No   Drug use: No   Sexual activity: Yes    Partners: Male    Birth control/protection: Surgical    Comment: Hysterectomy  Other Topics Concern   Not on file  Social History Narrative   09/21/19   From: [place], [place] originally   Living: with husband, Earl Lites and 2 daughters w/ family   Work: homemaker      Family: 3 children - [place], Short, and Hospital doctor. Has 8 grandchildren and one on the way      Enjoys: spend time with family, shopping      Exercise: trying to walk more   Diet: eats late at night, will go most of the day w/o eating      Safety   Seat belts: Yes    Guns: Yes  and secure   Safe in relationships: Yes    Social Drivers of Health   Financial Resource Strain: Low Risk  (03/15/2023)   Overall Financial Resource Strain (CARDIA)    Difficulty of Paying Living Expenses: Not hard at all  Food Insecurity: No Food Insecurity (03/15/2023)   Hunger Vital Sign    Worried About Running Out of Food in the Last Year: Never true    Ran Out of Food in the Last Year:  Never true  Transportation Needs: No Transportation Needs (03/15/2023)   PRAPARE - Administrator, Civil Service (Medical): No    Lack of Transportation (Non-Medical): No  Physical Activity: Insufficiently Active (03/15/2023)   Exercise Vital Sign    Days of Exercise per Week: 1 day    Minutes of Exercise per Session: 10 min  Stress: No Stress Concern Present (03/15/2023)   [employer] of Occupational Health - Occupational Stress Questionnaire    Feeling of Stress : Not at all  Social Connections: Moderately Integrated (03/15/2023)   Social Connection and Isolation Panel [NHANES]    Frequency of Communication with Friends and Family: More than three times a week    Frequency of Social Gatherings with Friends and Family: Twice a week    Attends Religious Services: More than 4 times per year    Active Member of [employer] or Organizations: No    Attends Engineer, structural: Not on file    Marital Status: Married     Family History: The patient's family history includes Anxiety disorder in her mother; Arrhythmia in her mother; Arthritis in her mother; Breast cancer (age of onset: 3) in her maternal aunt; Breast cancer (age of onset: 4) in her mother; Cancer in her father; Colon polyps in her father; Diabetes in her mother; Healthy in her daughter, daughter, and daughter; Heart disease in her mother; Hyperlipidemia in her mother; Hypertension in her father and mother; Kidney cancer in her father; Lymphoma in her father; Obesity in her father; Ovarian cancer in her paternal grandmother; Stroke (age of onset: 58) in her maternal grandmother; Thyroid disease in her mother. There is no history of Colon cancer.  ROS:   Review of Systems  Constitution: Negative for  decreased appetite, fever and weight gain.  HENT: Negative for congestion, ear discharge, hoarse voice and sore throat.   Eyes: Negative for discharge, redness, vision loss in right eye and visual halos.   Cardiovascular: Reports palpitations. Negative for chest pain, dyspnea on exertion, leg swelling, orthopnea  Respiratory: Negative for cough, hemoptysis, shortness of breath and snoring.   Endocrine: Negative for heat intolerance and polyphagia.  Hematologic/Lymphatic: Negative for bleeding problem. Does not bruise/bleed easily.  Skin: Negative for flushing, nail changes, rash and suspicious lesions.  Musculoskeletal: Negative for arthritis, joint pain, muscle cramps, myalgias, neck pain and stiffness.  Gastrointestinal: Negative for abdominal pain, bowel incontinence, diarrhea and excessive appetite.  Genitourinary: Negative for decreased libido, genital sores and incomplete emptying.  Neurological: Negative for brief paralysis, focal weakness, headaches and loss of balance.  Psychiatric/Behavioral: Negative for altered mental status, depression and suicidal ideas.  Allergic/Immunologic: Negative for HIV exposure and persistent infections.    EKGs/Labs/Other Studies Reviewed:    The following studies were reviewed today:   EKG:  The ekg ordered today demonstrates paced rhythm., Hr   bpm Recent Labs: 09/02/2022: Magnesium 2.0 06/11/2023: TSH 1.910 06/18/2023: ALT 18; BUN 16; Creatinine, Ser 0.69; Hemoglobin 13.3; Platelet Count 418; Potassium 3.8; Sodium 139  Recent Lipid Panel    Component Value Date/Time   CHOL 203 (H) 06/11/2023 1304   TRIG 64 06/11/2023 1304   HDL 65 06/11/2023 1304   CHOLHDL 3 09/09/2022 1006   VLDL 16.8 09/09/2022 1006   LDLCALC 126 (H) 06/11/2023 1304    Physical Exam:    VS:  BP 122/74 (BP Location: Right Arm, Patient Position: Sitting, Cuff Size: Normal)   Pulse 60   Ht 5\' 3"  (1.6 m)   Wt 245 lb 3.2 oz (111.2 kg)   LMP 01/20/2011   SpO2 97%   BMI 43.44 kg/m     Wt Readings from Last 3 Encounters:  06/24/23 245 lb 3.2 oz (111.2 kg)  06/18/23 249 lb (112.9 kg)  06/11/23 245 lb (111.1 kg)     GEN: Well nourished, well developed in no acute  distress HEENT: Normal NECK: No JVD; No carotid bruits LYMPHATICS: No lymphadenopathy CARDIAC: S1S2 noted,RRR, no murmurs, rubs, gallops RESPIRATORY:  Clear to auscultation without rales, wheezing or rhonchi  ABDOMEN: Soft, non-tender, non-distended, +bowel sounds, no guarding. EXTREMITIES: No edema, No cyanosis, no clubbing MUSCULOSKELETAL:  No deformity  SKIN: Warm and dry NEUROLOGIC:  Alert and oriented x 3, non-focal PSYCHIATRIC:  Normal affect, good insight  ASSESSMENT:    1. PVC (premature ventricular contraction)   2. DOE (dyspnea on exertion)   3. Medication management   4. Leg edema   5. Abdominal ultrasound, abnormal   6. Loose stools    PLAN:    Fluid Overload New onset of fluid retention, possibly secondary to diastolic heart failure vs pacemaker induced CM. I need to order an echo to reassess for LV function to help in understanding. . -Continue Lasix 40mg  daily, with flexibility to skip a day if feeling drained. -Order echocardiogram to reassess heart function. - will get blood work today to assess electrolytes.   Premature Ventricular Contractions (PVCs) Frequent PVCs, some lasting over a minute, causing discomfort and shortness of breath. -Order 14-day heart monitor to assess PVC burden. -Consider increasing dose of beta blocker after assessing PVC burden and heart function.  Fatty Liver (Steatosis) - nonalcoholic fatty liver  Recent ultrasound showed fatty liver with concerns of chronic loose stools. Patient is already on statins. Discuss  lifestyle modification. She follow health weight and wellness.  -Refer to Gastroenterology for further evaluation and management.  Heart block s/p pacemaker implantation - 06/10/2023 interrogation reviewed.   PAF - on rate control and Eliquis for stroke prevention.   Hx of OSA - no need for CPAP after bariatric sugery.     Follow-up in 12 weeks.  The patient is in agreement with the above plan. The patient left the  office in stable condition.  The patient will follow up in 9 months or sooner if needed.   Medication Adjustments/Labs and Tests Ordered: Current medicines are reviewed at length with the patient today.  Concerns regarding medicines are outlined above.  Orders Placed This Encounter  Procedures   Comprehensive Metabolic Panel (CMET)   Magnesium   Ambulatory referral to Gastroenterology   LONG TERM MONITOR (3-14 DAYS)   ECHOCARDIOGRAM COMPLETE   Meds ordered this encounter  Medications   furosemide (LASIX) 40 MG tablet    Sig: Take 1 tablet (40 mg total) by mouth daily.    Dispense:  90 tablet    Refill:  3   potassium chloride SA (KLOR-CON M20) 20 MEQ tablet    Sig: Take 1 tablet (20 mEq total) by mouth daily.    Dispense:  90 tablet    Refill:  3    Patient Instructions  Medication Instructions:  Your physician has recommended you make the following change in your medication:  START: Lasix 40 mg once daily START: Potassium 20 mEq once daily *If you need a refill on your cardiac medications before your next appointment, please call your pharmacy*   Lab Work: CMET, Mag If you have labs (blood work) drawn today and your tests are completely normal, you will receive your results only by: MyChart Message (if you have MyChart) OR A paper copy in the mail If you have any lab test that is abnormal or we need to change your treatment, we will call you to review the results.   Testing/Procedures: Your physician has requested that you have an echocardiogram. Echocardiography is a painless test that uses sound waves to create images of your heart. It provides your doctor with information about the size and shape of your heart and how well your heart's chambers and valves are working. This procedure takes approximately one hour. There are no restrictions for this procedure. Please do NOT wear cologne, perfume, aftershave, or lotions (deodorant is allowed). Please arrive 15 minutes  prior to your appointment time.  Please note: We ask at that you not bring children with you during ultrasound (echo/ vascular) testing. Due to room size and safety concerns, children are not allowed in the ultrasound rooms during exams. Our front office staff cannot provide observation of children in our lobby area while testing is being conducted. An adult accompanying a patient to their appointment will only be allowed in the ultrasound room at the discretion of the ultrasound technician under special circumstances. We apologize for any inconvenience.  ZIO XT- Long Term Monitor Instructions  Your physician has requested you wear a ZIO patch monitor for 14 days.  This is a single patch monitor. [employer] supplies one patch monitor per enrollment. Additional stickers are not available. Please do not apply patch if you will be having a Nuclear Stress Test,  Echocardiogram, Cardiac CT, MRI, or Chest Xray during the period you would be wearing the  monitor. The patch cannot be worn during these tests. You cannot remove and re-apply the  ZIO XT  patch monitor.  Your ZIO patch monitor will be mailed 3 day USPS to your address on file. It may take 3-5 days  to receive your monitor after you have been enrolled.  Once you have received your monitor, please review the enclosed instructions. Your monitor  has already been registered assigning a specific monitor serial # to you.  Billing and Patient Assistance Program Information  We have supplied [employer] with any of your insurance information on file for billing purposes. [employer] offers a sliding scale Patient Assistance Program for patients that do not have  insurance, or whose insurance does not completely cover the cost of the ZIO monitor.  You must apply for the Patient Assistance Program to qualify for this discounted rate.  To apply, please call [employer] at [phone], select option 4, select option 2, ask to apply for  Patient Assistance Program.  Meredeth Ide will ask your household income, and how many people  are in your household. They will quote your out-of-pocket cost based on that information.  [employer] will also be able to set up a 34-month, interest-free payment plan if needed.  Applying the monitor   Shave hair from upper left chest.  Hold abrader disc by orange tab. Rub abrader in 40 strokes over the upper left chest as  indicated in your monitor instructions.  Clean area with 4 enclosed alcohol pads. Let dry.  Apply patch as indicated in monitor instructions. Patch will be placed under collarbone on left  side of chest with arrow pointing upward.  Rub patch adhesive wings for 2 minutes. Remove white label marked "1". Remove the white  label marked "2". Rub patch adhesive wings for 2 additional minutes.  While looking in a mirror, press and release button in center of patch. A small green light will  flash 3-4 times. This will be your only indicator that the monitor has been turned on.  Do not shower for the first 24 hours. You may shower after the first 24 hours.  Press the button if you feel a symptom. You will hear a small click. Record Date, Time and  Symptom in the Patient Logbook.  When you are ready to remove the patch, follow instructions on the last 2 pages of Patient  Logbook. Stick patch monitor onto the last page of Patient Logbook.  Place Patient Logbook in the blue and white box. Use locking tab on box and tape box closed  securely. The blue and white box has prepaid postage on it. Please place it in the mailbox as  soon as possible. Your physician should have your test results approximately 7 days after the  monitor has been mailed back to [facility].  Call [facility] Customer Care at [phone] if you have questions regarding  your ZIO XT patch monitor. Call them immediately if you see an orange light blinking on your  monitor.  If your monitor falls off in less than 4 days, contact our Monitor  department at [phone].  If your monitor becomes loose or falls off after 4 days call [employer] at [phone] for  suggestions on securing your monitor   Follow-Up: At [facility], you and your health needs are our priority.  As part of our continuing mission to provide you with exceptional heart care, we have created designated Provider Care Teams.  These Care Teams include your primary Cardiologist (physician) and Advanced Practice Providers (APPs -  Physician Assistants and Nurse Practitioners) who all work together to provide you with the care  you need, when you need it.  Your next appointment:   12 week(s)  Provider:   Thomasene Ripple, DO     Other Instructions:     Adopting a Healthy Lifestyle.  Know what a healthy weight is for you (roughly BMI <25) and aim to maintain this   Aim for 7+ servings of fruits and vegetables daily   65-80+ fluid ounces of water or unsweet tea for healthy kidneys   Limit to max 1 drink of alcohol per day; avoid smoking/tobacco   Limit animal fats in diet for cholesterol and heart health - choose grass fed whenever available   Avoid highly processed foods, and foods high in saturated/trans fats   Aim for low stress - take time to unwind and care for your mental health   Aim for 150 min of moderate intensity exercise weekly for heart health, and weights twice weekly for bone health   Aim for 7-9 hours of sleep daily   When it comes to diets, agreement about the perfect plan isnt easy to find, even among the experts. Experts at the [facility] of [employer] developed an idea known as the Healthy Eating Plate. Just imagine a plate divided into logical, healthy portions.   The emphasis is on diet quality:   Load up on vegetables and fruits - one-half of your plate: Aim for color and variety, and remember that potatoes dont count.   Go for whole grains - one-quarter of your plate: Whole wheat, barley, wheat berries, quinoa,  oats, brown rice, and foods made with them. If you want pasta, go with whole wheat pasta.   Protein power - one-quarter of your plate: Fish, chicken, beans, and nuts are all healthy, versatile protein sources. Limit red meat.   The diet, however, does go beyond the plate, offering a few other suggestions.   Use healthy plant oils, such as olive, canola, soy, corn, sunflower and peanut. Check the labels, and avoid partially hydrogenated oil, which have unhealthy trans fats.   If youre thirsty, drink water. Coffee and tea are good in moderation, but skip sugary drinks and limit milk and dairy products to one or two daily servings.   The type of carbohydrate in the diet is more important than the amount. Some sources of carbohydrates, such as vegetables, fruits, whole grains, and beans-are healthier than others.   Finally, stay active  Signed, Thomasene Ripple, DO  06/24/2023 8:23 PM    [facility]

## 2023-06-25 ENCOUNTER — Encounter (INDEPENDENT_AMBULATORY_CARE_PROVIDER_SITE_OTHER): Payer: Self-pay | Admitting: Internal Medicine

## 2023-06-25 ENCOUNTER — Ambulatory Visit (INDEPENDENT_AMBULATORY_CARE_PROVIDER_SITE_OTHER): Payer: No Typology Code available for payment source | Admitting: Internal Medicine

## 2023-06-25 VITALS — BP 131/87 | HR 63 | Temp 98.2°F | Ht 63.0 in | Wt 241.0 lb

## 2023-06-25 DIAGNOSIS — I5032 Chronic diastolic (congestive) heart failure: Secondary | ICD-10-CM

## 2023-06-25 DIAGNOSIS — R7303 Prediabetes: Secondary | ICD-10-CM

## 2023-06-25 DIAGNOSIS — E66813 Obesity, class 3: Secondary | ICD-10-CM

## 2023-06-25 DIAGNOSIS — G4733 Obstructive sleep apnea (adult) (pediatric): Secondary | ICD-10-CM

## 2023-06-25 DIAGNOSIS — K760 Fatty (change of) liver, not elsewhere classified: Secondary | ICD-10-CM | POA: Diagnosis not present

## 2023-06-25 DIAGNOSIS — I110 Hypertensive heart disease with heart failure: Secondary | ICD-10-CM

## 2023-06-25 DIAGNOSIS — Z6841 Body Mass Index (BMI) 40.0 and over, adult: Secondary | ICD-10-CM

## 2023-06-25 DIAGNOSIS — I10 Essential (primary) hypertension: Secondary | ICD-10-CM

## 2023-06-25 DIAGNOSIS — Z9884 Bariatric surgery status: Secondary | ICD-10-CM

## 2023-06-25 LAB — COMPREHENSIVE METABOLIC PANEL
ALT: 18 [IU]/L (ref 0–32)
AST: 23 [IU]/L (ref 0–40)
Albumin: 4.6 g/dL (ref 3.8–4.9)
Alkaline Phosphatase: 156 [IU]/L — ABNORMAL HIGH (ref 44–121)
BUN/Creatinine Ratio: 24 — ABNORMAL HIGH (ref 9–23)
BUN: 15 mg/dL (ref 6–24)
Bilirubin Total: 0.3 mg/dL (ref 0.0–1.2)
CO2: 27 mmol/L (ref 20–29)
Calcium: 9.6 mg/dL (ref 8.7–10.2)
Chloride: 97 mmol/L (ref 96–106)
Creatinine, Ser: 0.62 mg/dL (ref 0.57–1.00)
Globulin, Total: 2.5 g/dL (ref 1.5–4.5)
Glucose: 81 mg/dL (ref 70–99)
Potassium: 4.3 mmol/L (ref 3.5–5.2)
Sodium: 140 mmol/L (ref 134–144)
Total Protein: 7.1 g/dL (ref 6.0–8.5)
eGFR: 103 mL/min/{1.73_m2} (ref >59)

## 2023-06-25 LAB — MAGNESIUM: Magnesium: 2.3 mg/dL (ref 1.6–2.3)

## 2023-06-25 NOTE — Progress Notes (Signed)
Office: [phone]  /  Fax: [phone]  Weight Summary And Biometrics  Vitals Temp: 98.2 F (36.8 C) BP: 131/87 Pulse Rate: 63 SpO2: 97 %   Anthropometric Measurements Height: 5\' 3"  (1.6 m) Weight: 241 lb (109.3 kg) BMI (Calculated): 42.7 Weight at Last Visit: 245 lb Weight Lost Since Last Visit: 4 lb Weight Gained Since Last Visit: 0 lb Starting Weight: 245 lb Total Weight Loss (lbs): 4 lb (1.814 kg) Peak Weight: 300   No data recorded  RMR: 172  Today's Visit #: 2  Starting Date: 06/10/22   Subjective   Chief Complaint: Obesity  [name] is here to discuss her progress with her obesity treatment plan. She is on the the Category 2 Plan and states she is following her eating plan approximately 45 % of the time. She states she is exercising 10 minutes 1-2 times per week.  Weight Progress Since Last Visit:  Discussed the use of AI scribe software for clinical note transcription with the patient, who gave verbal consent to proceed.  History of Present Illness   This is our second encounter with [name].  The patient presents for medical weight management.  She is currently undergoing medical weight management following a history of Roux-en-Y gastric bypass. She has lost four pounds, approximately two pounds per week, by incorporating more protein and healthy foods into her diet, such as eggs, whole wheat bread, yogurt, and healthy deli meat. She is eating more frequently to boost her metabolism, which she feels is aiding her weight loss.  She has a history of congestive heart failure and is monitored by her cardiologist. She experiences premature ventricular contractions (PVCs), fluid buildup, shortness of breath, and coughing, especially when lying down. She has a pacemaker set to maintain her heart rate at 60 bpm, but notes fluctuations, sometimes starting as low as 52 bpm. She recently had an echocardiogram and is scheduled for a follow-up in April.  She has  been diagnosed with a fatty liver, confirmed by an ultrasound earlier this week. Her alkaline phosphatase levels are elevated, and she was started on Lasix. She also reports low copper levels and is taking a copper supplement.  She takes Celebrex daily for bone pain but is aware it may contribute to fluid retention. She occasionally stops taking it to manage pain without medication and is exploring alternatives like turmeric for pain management.  She has a history of insulin resistance and prediabetes, with a recent A1c of 6.0. Her fasting blood sugar was 83, but her insulin levels are elevated, indicating insulin resistance. She is maintaining a low-carb diet to manage her insulin levels.  She is exploring exercise options like chair yoga with low weights and is monitoring her weight for fluid retention due to heart failure.       Challenges affecting patient progress: low volume of physical activity at present , orthopedic problems, medical conditions or chronic pain affecting mobility, medical comorbidities, and presence of obesogenic drugs.   Orexigenic Control: Denies problems with appetite and hunger signals.  Denies problems with satiety and satiation.  Denies problems with eating patterns and portion control.  Denies abnormal cravings. Denies feeling deprived or restricted.   Pharmacotherapy for weight management: She is currently taking no anti-obesity medication.   Assessment and Plan   Treatment Plan For Obesity:  Recommended Dietary Goals  [name] is currently in the action stage of change. As such, her goal is to continue weight management plan. She has agreed to: continue current plan  Behavioral Health and Counseling  We discussed the following behavioral modification strategies today: continue to work on maintaining a reduced calorie state, getting the recommended amount of protein, incorporating whole foods, making healthy choices, staying well hydrated and practicing  mindfulness when eating..  Additional education and resources provided today: None  Recommended Physical Activity Goals  [name] has been advised to work up to 150 minutes of moderate intensity aerobic activity a week and strengthening exercises 2-3 times per week for cardiovascular health, weight loss maintenance and preservation of muscle mass.   She has agreed to :  Think about enjoyable ways to increase daily physical activity and overcoming barriers to exercise and Increase physical activity in their day and reduce sedentary time (increase NEAT).  Pharmacotherapy  We discussed various medication options to help [name] with her weight loss efforts and we both agreed to : continue with nutritional and behavioral strategies.  Because of restrictive eating pattern associated with history of Roux-en-Y she does not benefit from antiobesity medications at present time.  She has started to lose weight by increasing caloric and protein intake.  Patient had a history of skipping of meals and eating inadequate amounts of protein.  Associated Conditions Impacted by Obesity Treatment  Essential hypertension Assessment & Plan: Blood pressure slightly above target.  She is currently on ARB, hydrochlorothiazide, beta-blockade and loop diuretic.  Patient has also been taking NSAIDs and we reviewed relative contraindications that she has problems with heart failure.  Continue current regimen monitor volume status as well as for orthostasis while losing weight.  Losing 10% of body weight may improve blood pressure control.   Obstructive sleep apnea Assessment & Plan: On CPAP with reported good compliance. Continue PAP therapy. Losing 15% or more of body weight may improve AHI.    At present time patient is not a good candidate for GLP-1 therapy due to restrictive eating pattern as a result of Roux-en-Y   History of Roux-en-Y gastric bypass Assessment & Plan: [name] 2012, presurgical weigth 300 lbs,  nadir 218 3-4 months, started weight regain in 2017 after menopause.  She is not taking bariatric vitamins and has been recently found to have some nutritional deficiencies.  Patient forgot to hold vitamins for 2 days we were going to obtain baseline levels.  Instead we have decided to start her on bariatric vitamin which will cover all of present nutritional deficiencies.  I reviewed with her the nature of Roux-en-Y and the need for lifelong vitamin supplementation.  In addition patient had developed a restrictive eating pattern as a result of Roux-en-Y this is what resulted in the weight regain she may have also lost muscle mass.  She is not losing weight with increasing frequency of meals as well as protein intake.   Metabolic dysfunction-associated steatotic liver disease (MASLD) Assessment & Plan: Patient reports having history of hepatic steatosis with elevated liver enzymes.  Most recent liver enzymes are normal I also reviewed most recent abdominal imaging including CT scans and ultrasounds which did not show the presence of hepatic steatosis.  This likely has improved following her Roux-en-Y.  She will continue to work on reducing saturated fats and simple sugars in her diet.  Losing to 15% of her body weight will reduce the risk of fatty liver.   Prediabetes Assessment & Plan: Most recent A1c is  Lab Results  Component Value Date   HGBA1C 6.0 (H) 06/11/2023   HGBA1C 5.8 07/28/2014    Patient aware of disease state and risk of  progression. This may contribute to abnormal cravings, fatigue and diabetic complications without having diabetes.   We have discussed treatment options which include: losing 7 to 10% of body weight, increasing physical activity to a goal of 150 minutes a week at moderate intensity.  Advised to maintain a diet low on simple and processed carbohydrates.  She may also be a candidate for pharmacoprophylaxis with metformin.    Class 3 severe obesity due to  excess calories with serious comorbidity and body mass index (BMI) of 40.0 to 44.9 in adult [facility]) Assessment & Plan: Patient has started weight loss process at a healthy rate of 2 pounds per week which is what is expected by increasing the frequency of her meals as well as protein intake.  We reviewed the benefits of increasing physical activity.  She is not a good candidate for antiobesity medications as she is not overeating and her weight regain is due to chronic caloric restriction which resulted in adaptive thermogenesis.  She will continue with nutritional and behavioral strategies.  We may consider adding metformin to support her weight loss efforts and pharmacoprophylaxis for prediabetes.   Chronic heart failure with preserved ejection fraction (HCC) Assessment & Plan: I reviewed most recent echocardiogram she has a normal ejection fraction and grade 1 diastolic dysfunction.  She has been taking NSAIDs which may result in elevations in blood pressure and fluid retention we advised for her to discontinue the use of these medications as she is having problems with heart failure.  She will continue to monitor her weights and report any weight gain of 3 pounds in 24 hours or more than 5 pounds over 7 days.  She has a follow-up scheduled at the cardiac clinic.  She is on a beta-blocker, ARB, hydrochlorothiazide and loop diuretic.  Recommend close monitoring of her electrolytes.      Objective   Physical Exam:  Blood pressure 131/87, pulse 63, temperature 98.2 F (36.8 C), height 5\' 3"  (1.6 m), weight 241 lb (109.3 kg), last menstrual period 01/20/2011, SpO2 97%. Body mass index is 42.69 kg/m.  General: She is overweight, cooperative, alert, well developed, and in no acute distress. PSYCH: Has normal mood, affect and thought process.   HEENT: EOMI, sclerae are anicteric. Lungs: Normal breathing effort, no conversational dyspnea. Extremities: No edema.  Neurologic: No gross sensory or  motor deficits. No tremors or fasciculations noted.    Diagnostic Data Reviewed:  BMET    Component Value Date/Time   NA 140 06/24/2023 1053   NA 138 04/27/2017 0933   K 4.3 06/24/2023 1053   K 3.3 (L) 04/27/2017 0933   CL 97 06/24/2023 1053   CO2 27 06/24/2023 1053   CO2 26 04/27/2017 0933   GLUCOSE 81 06/24/2023 1053   GLUCOSE 92 06/18/2023 0902   GLUCOSE 85 04/27/2017 0933   BUN 15 06/24/2023 1053   BUN 12.2 04/27/2017 0933   CREATININE 0.62 06/24/2023 1053   CREATININE 0.64 12/16/2022 0931   CREATININE 0.7 04/27/2017 0933   CALCIUM 9.6 06/24/2023 1053   CALCIUM 9.1 04/27/2017 0933   GFRNONAA >60 06/18/2023 0902   GFRNONAA >60 12/16/2022 0931   GFRAA >60 01/27/2020 0923   GFRAA >60 12/12/2019 1036   Lab Results  Component Value Date   HGBA1C 6.0 (H) 06/11/2023   HGBA1C 5.8 07/28/2014   Lab Results  Component Value Date   INSULIN 14.9 06/11/2023   Lab Results  Component Value Date   TSH 1.910 06/11/2023   CBC  Component Value Date/Time   WBC 8.8 06/18/2023 0902   WBC 6.8 09/09/2022 1006   RBC 4.45 06/18/2023 0903   RBC 4.55 06/18/2023 0902   HGB 13.3 06/18/2023 0902   HGB 13.6 06/11/2023 1304   HGB 13.8 04/27/2017 0933   HGB 13.5 04/04/2008 0902   HCT 40.2 06/18/2023 0902   HCT 43.2 06/11/2023 1304   HCT 41.3 04/27/2017 0933   HCT 39.7 04/04/2008 0902   PLT 418 (H) 06/18/2023 0902   PLT 509 (H) 06/11/2023 1304   MCV 88.4 06/18/2023 0902   MCV 90 06/11/2023 1304   MCV 90 04/27/2017 0933   MCV 89.9 04/04/2008 0902   MCH 29.2 06/18/2023 0902   MCHC 33.1 06/18/2023 0902   RDW 13.2 06/18/2023 0902   RDW 13.3 06/11/2023 1304   RDW 13.5 04/27/2017 0933   RDW 14.5 04/04/2008 0902   Iron Studies    Component Value Date/Time   IRON 107 06/18/2023 0902   IRON 95 08/29/2019 0000   TIBC 402 06/18/2023 0902   TIBC 262 08/29/2019 0000   FERRITIN 16 06/18/2023 0903   FERRITIN 62 04/27/2017 0933   IRONPCTSAT 27 06/18/2023 0902   IRONPCTSAT 36  08/29/2019 0000   IRONPCTSAT 24 04/04/2008 0902   Lipid Panel     Component Value Date/Time   CHOL 203 (H) 06/11/2023 1304   TRIG 64 06/11/2023 1304   HDL 65 06/11/2023 1304   CHOLHDL 3 09/09/2022 1006   VLDL 16.8 09/09/2022 1006   LDLCALC 126 (H) 06/11/2023 1304   Hepatic Function Panel     Component Value Date/Time   PROT 7.1 06/24/2023 1053   PROT 7.5 04/27/2017 0933   ALBUMIN 4.6 06/24/2023 1053   ALBUMIN 4.0 04/27/2017 0933   AST 23 06/24/2023 1053   AST 23 12/16/2022 0931   AST 25 04/27/2017 0933   ALT 18 06/24/2023 1053   ALT 19 12/16/2022 0931   ALT 19 04/27/2017 0933   ALKPHOS 156 (H) 06/24/2023 1053   ALKPHOS 139 04/27/2017 0933   BILITOT 0.3 06/24/2023 1053   BILITOT 0.4 12/16/2022 0931   BILITOT 0.58 04/27/2017 0933   BILIDIR 0.1 09/19/2014 1040   IBILI 0.2 01/10/2011 1545      Component Value Date/Time   TSH 1.910 06/11/2023 1304   Nutritional Lab Results  Component Value Date   VD25OH 37.99 09/09/2022   VD25OH 26.93 (L) 08/07/2021   VD25OH 37.88 03/16/2017    Follow-Up   Return in about 3 weeks (around 07/16/2023) for For Weight Mangement with Dr. Rikki Spearing.Marland Kitchen She was informed of the importance of frequent follow up visits to maximize her success with intensive lifestyle modifications for her multiple health conditions.  Attestation Statement   Reviewed by clinician on day of visit: allergies, medications, problem list, medical history, surgical history, family history, social history, and previous encounter notes.   I have spent 40 minutes in the care of the patient today including: preparing to see patient (e.g. review and interpretation of tests, old notes ), obtaining and/or reviewing separately obtained history, performing a medically appropriate examination or evaluation, counseling and educating the patient, documenting clinical information in the electronic or other health care record, and independently interpreting results and communicating  results to the patient, family, or caregiver   Worthy Rancher, MD

## 2023-06-26 ENCOUNTER — Ambulatory Visit: Payer: Self-pay | Admitting: Cardiology

## 2023-06-26 DIAGNOSIS — I5032 Chronic diastolic (congestive) heart failure: Secondary | ICD-10-CM | POA: Insufficient documentation

## 2023-06-26 DIAGNOSIS — R7303 Prediabetes: Secondary | ICD-10-CM | POA: Insufficient documentation

## 2023-06-26 NOTE — Assessment & Plan Note (Addendum)
Patient has started weight loss process at a healthy rate of 2 pounds per week which is what is expected by increasing the frequency of her meals as well as protein intake.  We reviewed the benefits of increasing physical activity.  She is not a good candidate for antiobesity medications as she is not overeating and her weight regain is due to chronic caloric restriction which resulted in adaptive thermogenesis.  She will continue with nutritional and behavioral strategies.  We may consider adding metformin to support her weight loss efforts and pharmacoprophylaxis for prediabetes.

## 2023-06-26 NOTE — Assessment & Plan Note (Signed)
[facility] 2012, presurgical weigth 300 lbs, nadir 218 3-4 months, started weight regain in 2017 after menopause.  She is not taking bariatric vitamins and has been recently found to have some nutritional deficiencies.  Patient forgot to hold vitamins for 2 days we were going to obtain baseline levels.  Instead we have decided to start her on bariatric vitamin which will cover all of present nutritional deficiencies.  I reviewed with her the nature of Roux-en-Y and the need for lifelong vitamin supplementation.  In addition patient had developed a restrictive eating pattern as a result of Roux-en-Y this is what resulted in the weight regain she may have also lost muscle mass.  She is not losing weight with increasing frequency of meals as well as protein intake.

## 2023-06-26 NOTE — Assessment & Plan Note (Signed)
 Patient reports having history of hepatic steatosis with elevated liver enzymes.  Most recent liver enzymes are normal I also reviewed most recent abdominal imaging including CT scans and ultrasounds which did not show the presence of hepatic steatosis.  This likely has improved following her Roux-en-Y.  She will continue to work on reducing saturated fats and simple sugars in her diet.  Losing to 15% of her body weight will reduce the risk of fatty liver.

## 2023-06-26 NOTE — Assessment & Plan Note (Signed)
Most recent A1c is  Lab Results  Component Value Date   HGBA1C 6.0 (H) 06/11/2023   HGBA1C 5.8 07/28/2014    Patient aware of disease state and risk of progression. This may contribute to abnormal cravings, fatigue and diabetic complications without having diabetes.   We have discussed treatment options which include: losing 7 to 10% of body weight, increasing physical activity to a goal of 150 minutes a week at moderate intensity.  Advised to maintain a diet low on simple and processed carbohydrates.  She may also be a candidate for pharmacoprophylaxis with metformin.

## 2023-06-26 NOTE — Assessment & Plan Note (Signed)
I reviewed most recent echocardiogram she has a normal ejection fraction and grade 1 diastolic dysfunction.  She has been taking NSAIDs which may result in elevations in blood pressure and fluid retention we advised for her to discontinue the use of these medications as she is having problems with heart failure.  She will continue to monitor her weights and report any weight gain of 3 pounds in 24 hours or more than 5 pounds over 7 days.  She has a follow-up scheduled at the cardiac clinic.  She is on a beta-blocker, ARB, hydrochlorothiazide and loop diuretic.  Recommend close monitoring of her electrolytes.

## 2023-06-26 NOTE — Assessment & Plan Note (Signed)
On CPAP with reported good compliance. Continue PAP therapy. Losing 15% or more of body weight may improve AHI.    At present time patient is not a good candidate for GLP-1 therapy due to restrictive eating pattern as a result of Roux-en-Y

## 2023-06-26 NOTE — Assessment & Plan Note (Signed)
Blood pressure slightly above target.  She is currently on ARB, hydrochlorothiazide, beta-blockade and loop diuretic.  Patient has also been taking NSAIDs and we reviewed relative contraindications that she has problems with heart failure.  Continue current regimen monitor volume status as well as for orthostasis while losing weight.  Losing 10% of body weight may improve blood pressure control.

## 2023-06-27 DIAGNOSIS — I493 Ventricular premature depolarization: Secondary | ICD-10-CM

## 2023-07-04 ENCOUNTER — Encounter: Payer: Self-pay | Admitting: Internal Medicine

## 2023-07-21 ENCOUNTER — Encounter (INDEPENDENT_AMBULATORY_CARE_PROVIDER_SITE_OTHER): Payer: Self-pay | Admitting: Internal Medicine

## 2023-07-21 ENCOUNTER — Ambulatory Visit (INDEPENDENT_AMBULATORY_CARE_PROVIDER_SITE_OTHER): Payer: No Typology Code available for payment source | Admitting: Internal Medicine

## 2023-07-21 VITALS — BP 136/80 | HR 65 | Temp 97.7°F | Ht 63.0 in | Wt 241.0 lb

## 2023-07-21 DIAGNOSIS — I5030 Unspecified diastolic (congestive) heart failure: Secondary | ICD-10-CM

## 2023-07-21 DIAGNOSIS — G4733 Obstructive sleep apnea (adult) (pediatric): Secondary | ICD-10-CM

## 2023-07-21 DIAGNOSIS — Z6841 Body Mass Index (BMI) 40.0 and over, adult: Secondary | ICD-10-CM

## 2023-07-21 DIAGNOSIS — K760 Fatty (change of) liver, not elsewhere classified: Secondary | ICD-10-CM | POA: Diagnosis not present

## 2023-07-21 DIAGNOSIS — E66813 Obesity, class 3: Secondary | ICD-10-CM

## 2023-07-21 DIAGNOSIS — Z9884 Bariatric surgery status: Secondary | ICD-10-CM

## 2023-07-21 NOTE — Progress Notes (Signed)
 Remote pacemaker transmission.

## 2023-07-22 NOTE — Progress Notes (Signed)
 Office: [phone]  /  Fax: [phone]  Weight Summary And Biometrics  Vitals Temp: 97.7 F (36.5 C) Pulse Rate: 65 SpO2: 100 %   Anthropometric Measurements Height: 5\' 3"  (1.6 m) Weight: 241 lb (109.3 kg) BMI (Calculated): 42.7 Weight at Last Visit: 241 lb Weight Lost Since Last Visit: 0 lb Weight Gained Since Last Visit: 0 lb Starting Weight: 245 lb Total Weight Loss (lbs): 4 lb (1.814 kg) Peak Weight: 300 lb   No data recorded  RMR: 1728  Today's Visit #: 3  Starting Date: 06/10/22   Subjective   Chief Complaint: Obesity  Discussed the use of AI scribe software for clinical note transcription with the patient, who gave verbal consent to proceed.  History of Present Illness   [name] is a 58 year old female with a history of Roux-en-Y gastric bypass who presents for medical weight management.  She is consuming 1200 calories per day and adheres to her dietary plan 90% of the time, focusing on whole foods, adequate protein intake, and hydration. She denies skipping meals but is not engaging in any exercise. Her typical meals include a breakfast of half a banana, eggs with thin cheese, and a 45-calorie slice of bread, and a lunch of two slices of bread, a thin piece of cheese, about an ounce of [place], lettuce, and onions. She feels very full after these meals and consumes protein yogurt with almonds when hungry.   She is scheduled for an echocardiogram next Tuesday and is awaiting results from a heart monitor.  She has a history of connective tissue disease and will be seeing a specialist at [facility]        Assessment and Plan   Treatment Plan For Obesity:  Recommended Dietary Goals  [name] is currently in the action stage of change. As such, her goal is to continue weight management plan. She has agreed to: continue current plan  Behavioral Health and Counseling  We discussed the following behavioral modification strategies  today: continue to work on maintaining a reduced calorie state, getting the recommended amount of protein, incorporating whole foods, making healthy choices, staying well hydrated and practicing mindfulness when eating..  Additional education and resources provided today: Handout on tracking and journaling with personalized instruction  Recommended Physical Activity Goals  [name] has been advised to work up to 150 minutes of moderate intensity aerobic activity a week and strengthening exercises 2-3 times per week for cardiovascular health, weight loss maintenance and preservation of muscle mass.   She has agreed to :  Think about enjoyable ways to increase daily physical activity and overcoming barriers to exercise and Increase physical activity in their day and reduce sedentary time (increase NEAT).  Pharmacotherapy  We discussed various medication options to help [name] with her weight loss efforts and we both agreed to : continue with nutritional and behavioral strategies  Associated Conditions Impacted by Obesity Treatment  Obstructive sleep apnea  Metabolic dysfunction-associated steatotic liver disease (MASLD)  History of Roux-en-Y gastric bypass  Class 3 severe obesity with serious comorbidity and body mass index (BMI) of 40.0 to 44.9 in adult, unspecified obesity type (HCC)   Assessment and Plan    Obesity Presents for medical weight management. Post-Roux-en-Y gastric bypass with no weight loss since last visit. Tracking 1200 calories, following plan 90% of the time, consuming whole foods, adequate protein, and maintaining hydration. Not exercising. Discussed importance of tracking food intake, especially protein, to aid weight loss. Emphasized benefits of tracking  apps. Patients who track 3-4 days a week lose about 10% of body weight. Discussed potential use of weight loss medications but noted risks due to history of chronic skipping of meals. - Encourage use of tracking apps  for food intake - Focus on tracking protein intake to achieve 90 grams per day - Consider prepackaged meals and meal prepping - Evaluate insurance coverage for weight loss medications (Cephan and Wegovy) - Follow up in 3 weeks  OSA On CPAP with reported good compliance. Continue PAP therapy. Losing 15% or more of body weight may improve AHI.    At present time patient is not a good candidate for GLP-1 therapy due to restrictive eating pattern as a result of Roux-en-Y  Heart Failure with Preserved Ejection Fraction (HFpEF)  Cardiologist suspects issues related to connective tissue disease affecting the electrical system. - Continue follow-up with cardiologist - Await results of echocardiogram and heart monitor - Consider light physical activity as tolerated  General Health Maintenance Discussed importance of maintaining physical activity and reducing prolonged sitting. Encouraged light physical activity such as short walks and staying active throughout the day. - Encourage light physical activity - Reduce prolonged sitting and excessive TV watching  Follow-up - Follow up in 3 weeks - Check insurance coverage for weight loss medications (Cephan and [name]).        I have spent 30 minutes in the care of the patient today including: preparing to see patient (e.g. review and interpretation of tests, old notes ), obtaining and/or reviewing separately obtained history, counseling and educating the patient, documenting clinical information in the electronic or other health care record, and independently interpreting results and communicating results to the patient, family, or caregiver   Objective   Physical Exam:  Pulse 65, temperature 97.7 F (36.5 C), height 5\' 3"  (1.6 m), weight 241 lb (109.3 kg), last menstrual period 01/20/2011, SpO2 100%. Body mass index is 42.69 kg/m.  General: She is overweight, cooperative, alert, well developed, and in no acute distress. PSYCH: Has normal  mood, affect and thought process.   HEENT: EOMI, sclerae are anicteric. Lungs: Normal breathing effort, no conversational dyspnea. Extremities: No edema.  Neurologic: No gross sensory or motor deficits. No tremors or fasciculations noted.    Diagnostic Data Reviewed:  BMET    Component Value Date/Time   NA 140 06/24/2023 1053   NA 138 04/27/2017 0933   K 4.3 06/24/2023 1053   K 3.3 (L) 04/27/2017 0933   CL 97 06/24/2023 1053   CO2 27 06/24/2023 1053   CO2 26 04/27/2017 0933   GLUCOSE 81 06/24/2023 1053   GLUCOSE 92 06/18/2023 0902   GLUCOSE 85 04/27/2017 0933   BUN 15 06/24/2023 1053   BUN 12.2 04/27/2017 0933   CREATININE 0.62 06/24/2023 1053   CREATININE 0.64 12/16/2022 0931   CREATININE 0.7 04/27/2017 0933   CALCIUM 9.6 06/24/2023 1053   CALCIUM 9.1 04/27/2017 0933   GFRNONAA >60 06/18/2023 0902   GFRNONAA >60 12/16/2022 0931   GFRAA >60 01/27/2020 0923   GFRAA >60 12/12/2019 1036   Lab Results  Component Value Date   HGBA1C 6.0 (H) 06/11/2023   HGBA1C 5.8 07/28/2014   Lab Results  Component Value Date   INSULIN 14.9 06/11/2023   Lab Results  Component Value Date   TSH 1.910 06/11/2023   CBC    Component Value Date/Time   WBC 8.8 06/18/2023 0902   WBC 6.8 09/09/2022 1006   RBC 4.45 06/18/2023 0903   RBC 4.55  06/18/2023 0902   HGB 13.3 06/18/2023 0902   HGB 13.6 06/11/2023 1304   HGB 13.8 04/27/2017 0933   HGB 13.5 04/04/2008 0902   HCT 40.2 06/18/2023 0902   HCT 43.2 06/11/2023 1304   HCT 41.3 04/27/2017 0933   HCT 39.7 04/04/2008 0902   PLT 418 (H) 06/18/2023 0902   PLT 509 (H) 06/11/2023 1304   MCV 88.4 06/18/2023 0902   MCV 90 06/11/2023 1304   MCV 90 04/27/2017 0933   MCV 89.9 04/04/2008 0902   MCH 29.2 06/18/2023 0902   MCHC 33.1 06/18/2023 0902   RDW 13.2 06/18/2023 0902   RDW 13.3 06/11/2023 1304   RDW 13.5 04/27/2017 0933   RDW 14.5 04/04/2008 0902   Iron Studies    Component Value Date/Time   IRON 107 06/18/2023 0902   IRON 95  08/29/2019 0000   TIBC 402 06/18/2023 0902   TIBC 262 08/29/2019 0000   FERRITIN 16 06/18/2023 0903   FERRITIN 62 04/27/2017 0933   IRONPCTSAT 27 06/18/2023 0902   IRONPCTSAT 36 08/29/2019 0000   IRONPCTSAT 24 04/04/2008 0902   Lipid Panel     Component Value Date/Time   CHOL 203 (H) 06/11/2023 1304   TRIG 64 06/11/2023 1304   HDL 65 06/11/2023 1304   CHOLHDL 3 09/09/2022 1006   VLDL 16.8 09/09/2022 1006   LDLCALC 126 (H) 06/11/2023 1304   Hepatic Function Panel     Component Value Date/Time   PROT 7.1 06/24/2023 1053   PROT 7.5 04/27/2017 0933   ALBUMIN 4.6 06/24/2023 1053   ALBUMIN 4.0 04/27/2017 0933   AST 23 06/24/2023 1053   AST 23 12/16/2022 0931   AST 25 04/27/2017 0933   ALT 18 06/24/2023 1053   ALT 19 12/16/2022 0931   ALT 19 04/27/2017 0933   ALKPHOS 156 (H) 06/24/2023 1053   ALKPHOS 139 04/27/2017 0933   BILITOT 0.3 06/24/2023 1053   BILITOT 0.4 12/16/2022 0931   BILITOT 0.58 04/27/2017 0933   BILIDIR 0.1 09/19/2014 1040   IBILI 0.2 01/10/2011 1545      Component Value Date/Time   TSH 1.910 06/11/2023 1304   Nutritional Lab Results  Component Value Date   VD25OH 37.99 09/09/2022   VD25OH 26.93 (L) 08/07/2021   VD25OH 37.88 03/16/2017    Follow-Up   Return in about 3 weeks (around 08/11/2023) for For Weight Mangement with Dr. Rikki Spearing.Marland Kitchen She was informed of the importance of frequent follow up visits to maximize her success with intensive lifestyle modifications for her multiple health conditions.  Attestation Statement   Reviewed by clinician on day of visit: allergies, medications, problem list, medical history, surgical history, family history, social history, and previous encounter notes.     Worthy Rancher, MD

## 2023-07-27 ENCOUNTER — Ambulatory Visit (HOSPITAL_COMMUNITY)
Admission: RE | Admit: 2023-07-27 | Discharge: 2023-07-27 | Disposition: A | Discharge status: Home / Self Care | Payer: No Typology Code available for payment source | Source: Ambulatory Visit | Attending: Cardiology | Admitting: Cardiology

## 2023-07-27 DIAGNOSIS — R600 Localized edema: Secondary | ICD-10-CM | POA: Insufficient documentation

## 2023-07-27 DIAGNOSIS — R0609 Other forms of dyspnea: Secondary | ICD-10-CM | POA: Insufficient documentation

## 2023-07-27 LAB — ECHOCARDIOGRAM COMPLETE
AR max vel: 2.46 cm2
AV Area VTI: 2.42 cm2
AV Area mean vel: 2.38 cm2
AV Mean grad: 6 mmHg
AV Peak grad: 12 mmHg
Ao pk vel: 1.74 m/s
Area-P 1/2: 3.3 cm2
MV M vel: 2.31 m/s
MV Peak grad: 21.3 mmHg
S' Lateral: 3.12 cm

## 2023-07-29 ENCOUNTER — Encounter: Payer: Self-pay | Admitting: Hematology & Oncology

## 2023-07-29 ENCOUNTER — Other Ambulatory Visit: Payer: Self-pay | Admitting: Family

## 2023-08-04 ENCOUNTER — Ambulatory Visit
Admission: RE | Admit: 2023-08-04 | Discharge: 2023-08-04 | Disposition: A | Discharge status: Home / Self Care | Source: Ambulatory Visit | Attending: Family Medicine | Admitting: Family Medicine

## 2023-08-04 VITALS — BP 111/63 | HR 74 | Temp 99.9°F | Resp 17

## 2023-08-04 DIAGNOSIS — B9789 Other viral agents as the cause of diseases classified elsewhere: Secondary | ICD-10-CM

## 2023-08-04 DIAGNOSIS — J988 Other specified respiratory disorders: Secondary | ICD-10-CM | POA: Diagnosis not present

## 2023-08-04 LAB — POC COVID19/FLU A&B COMBO
Covid Antigen, POC: NEGATIVE
Influenza A Antigen, POC: NEGATIVE
Influenza B Antigen, POC: NEGATIVE

## 2023-08-04 MED ORDER — ALBUTEROL SULFATE HFA 108 (90 BASE) MCG/ACT IN AERS: 1.0000 | INHALATION_SPRAY | Freq: Four times a day (QID) | RESPIRATORY_TRACT | 0 refills | Status: DC | PRN

## 2023-08-04 MED ORDER — PROMETHAZINE-DM 6.25-15 MG/5ML PO SYRP: 5.0000 mL | ORAL_SOLUTION | Freq: Three times a day (TID) | ORAL | 0 refills | Status: DC | PRN

## 2023-08-04 MED ORDER — CETIRIZINE HCL 10 MG PO TABS: 10.0000 mg | ORAL_TABLET | Freq: Every day | ORAL | 0 refills | Status: AC

## 2023-08-04 NOTE — ED Triage Notes (Signed)
 Pt c/o cough, fever, chest congestion, body aches x 2 days-NAD-steady gait

## 2023-08-04 NOTE — ED Provider Notes (Signed)
 [facility]  Note:  This document was prepared using Conservation officer, historic buildings and may include unintentional dictation errors.  MRN: [id number] DOB: Jan 13, 1966  Subjective:   [name] is a 58 y.o. female presenting for 2-day history of fever, chest congestion, coughing, body pains, malaise and fatigue.  Cough can elicit chest pain.  No asthma.  Has a history of atrial fibrillation, bariatric surgery.  No smoking of any kind including cigarettes, cigars, vaping, marijuana use.  Patient presents with her husband with similar symptoms, he was tested yesterday at his PCPs office and was negative for COVID and flu.  No current facility-administered medications for this encounter.  Current Outpatient Medications:    acetaminophen (TYLENOL) 325 MG tablet, Take 1-2 tablets (325-650 mg total) by mouth every 4 (four) hours as needed for mild pain., Disp: , Rfl:    Biotin 1000 MCG tablet, Take 1,000 mcg by mouth every other day., Disp: , Rfl:    celecoxib (CELEBREX) 200 MG capsule, TAKE 1 CAPSULE BY MOUTH DAILY AS NEEDED FOR PAIN, Disp: 30 capsule, Rfl: 1   cetirizine (ZYRTEC) 10 MG tablet, Take 10 mg by mouth daily., Disp: , Rfl:    Cholecalciferol (VITAMIN D3) 50 MCG (2000 UT) TABS, Take 2,000 Units by mouth daily., Disp: , Rfl:    COLLAGEN PO, Take by mouth. Liquid collagen with biotin, Disp: , Rfl:    copper tablet, Take 3 tablets (6 mg total) by mouth daily for 7 days, THEN 2 tablets (4 mg total) daily for 7 days, THEN 1 tablet (2 mg total) daily., Disp: 125 tablet, Rfl: 0   cyanocobalamin (VITAMIN B12) 1000 MCG/ML injection, Inject 1 ml into the muscle once a week for a month.  Then inject 1 ml into the muscle once a month thereafter., Disp: 6 mL, Rfl: 11   famotidine (PEPCID) 40 MG tablet, Take 40 mg by mouth 2 (two) times daily., Disp: , Rfl:    fluticasone (FLONASE) 50 MCG/ACT nasal spray, Place 2 sprays into both nostrils daily., Disp: 16 g, Rfl: 6    furosemide (LASIX) 40 MG tablet, Take 1 tablet (40 mg total) by mouth daily., Disp: 90 tablet, Rfl: 3   Glucosamine HCl (GLUCOSAMINE PO), Take 1,000 mg by mouth daily., Disp: , Rfl:    hydrochlorothiazide (HYDRODIURIL) 25 MG tablet, TAKE 1 TABLET (25 MG TOTAL) BY MOUTH DAILY., Disp: 90 tablet, Rfl: 1   Ibuprofen 200 MG CAPS, Take 400 mg by mouth daily as needed (For pain)., Disp: , Rfl:    levothyroxine (SYNTHROID) 88 MCG tablet, TAKE 1 TABLET BY MOUTH EVERY DAY, Disp: 90 tablet, Rfl: 1   loratadine (CLARITIN) 10 MG tablet, TAKE 1 TABLET BY MOUTH EVERY DAY, Disp: 90 tablet, Rfl: 2   Magnesium 250 MG TABS, Take 420 mg by mouth daily. For leg cramps, Disp: , Rfl:    Menthol, Topical Analgesic, (BIOFREEZE EX), Apply 1 application  topically daily as needed (On Knees and feet)., Disp: , Rfl:    metoprolol succinate (TOPROL XL) 25 MG 24 hr tablet, Take 1 tablet (25 mg total) by mouth 2 (two) times daily., Disp: 180 tablet, Rfl: 3   Misc Natural Products (YUMVS BEET ROOT-TART CHERRY) 250-0.5 MG CHEW, Chew 1 tablet by mouth 2 (two) times a week., Disp: , Rfl:    potassium chloride SA (KLOR-CON M20) 20 MEQ tablet, Take 1 tablet (20 mEq total) by mouth daily., Disp: 90 tablet, Rfl: 3   rosuvastatin (CRESTOR) 5 MG  tablet, Take 1 tablet (5 mg total) by mouth at bedtime., Disp: 30 tablet, Rfl: 11   SYRINGE-NEEDLE, DISP, 3 ML (BD SAFETYGLIDE SYRINGE/NEEDLE) 25G X 1" 3 ML MISC, Use for B12 injections, Disp: 100 each, Rfl: 3   Turmeric 500 MG CAPS, Take 500 mg by mouth as needed. 3 times weekly, 1 tab po, Disp: , Rfl:    valsartan (DIOVAN) 160 MG tablet, Take 1 tablet (160 mg total) by mouth daily., Disp: 90 tablet, Rfl: 3   Allergies  Allergen Reactions   Penicillins Other (See Comments)    Causes severe, difficult to treat yeast infections    Past Medical History:  Diagnosis Date   A-fib (HCC)    Allergic rhinitis, cause unspecified    Allergy 2016   Anemia, iron deficiency    Arthritis    Back pain     CFS (chronic fatigue syndrome)    Chest pain    COVID-19 06/15/2020   Diastolic dysfunction    Diverticulosis of colon    Edema, lower extremity    Fatty liver    Gallbladder problem    GERD (gastroesophageal reflux disease)    Goiter    Hiatal hernia    Hypertension    Hypothyroidism    IBS (irritable bowel syndrome)    Joint pain    Morbid obesity (HCC)    Obstructive sleep apnea    Osteoarthritis    Pacemaker    Sleep apnea    SOB (shortness of breath)    Thrombocytosis    Undiagnosed cardiac murmurs    Vitamin B 12 deficiency    Vitamin D deficiency      Past Surgical History:  Procedure Laterality Date   BARIATRIC SURGERY  06/17/2010   BILATERAL SALPINGECTOMY Bilateral 06/22/2014   Procedure: BILATERAL SALPINGECTOMY;  Surgeon: Jaymes Graff, MD;  Location: [facility];  Service: Gynecology;  Laterality: Bilateral;   CYSTOSCOPY N/A 06/22/2014   Procedure: CYSTOSCOPY;  Surgeon: Jaymes Graff, MD;  Location: [facility];  Service: Gynecology;  Laterality: N/A;   ENDOMETRIAL ABLATION  04/2007   FOOT SURGERY Left 11/24/2017   Dr Charlsie Merles   LAPAROSCOPIC ASSISTED VAGINAL HYSTERECTOMY N/A 06/22/2014   Procedure: LAPAROSCOPIC ASSISTED VAGINAL HYSTERECTOMY;  Surgeon: Jaymes Graff, MD;  Location: [facility];  Service: Gynecology;  Laterality: N/A;   LEAD REVISION/REPAIR N/A 12/17/2021   Procedure: LEAD REVISION/REPAIR;  Surgeon: Marinus Maw, MD;  Location: [facility];  Service: Cardiovascular;  Laterality: N/A;   LIPOMA EXCISION     upper back   PACEMAKER IMPLANT N/A 12/09/2021   Procedure: PACEMAKER IMPLANT;  Surgeon: Marinus Maw, MD;  Location: [facility];  Service: Cardiovascular;  Laterality: N/A;   SPINE SURGERY  02/10/2012   L4-l5 fusion--cohen    Family History  Problem Relation Age of Onset   Breast cancer Mother 51   Arrhythmia Mother    Heart disease Mother    Arthritis Mother    Diabetes Mother    Hyperlipidemia Mother    Hypertension Mother     Thyroid disease Mother    Anxiety disorder Mother    Hypertension Father    Kidney cancer Father    Colon polyps Father    Lymphoma Father        Diffused Large B-cell Lymphoma   Cancer Father    Obesity Father    Stroke Maternal Grandmother 16   Ovarian cancer Paternal Grandmother    Healthy Daughter    Healthy Daughter  Healthy Daughter    Breast cancer Maternal Aunt 47   Colon cancer Neg Hx     Social History   Tobacco Use   Smoking status: Never    Passive exposure: Never   Smokeless tobacco: Never   Tobacco comments:    Never Used Tobacco  Vaping Use   Vaping status: Never Used  Substance Use Topics   Alcohol use: No   Drug use: No    ROS   Objective:   Vitals: BP 111/63   Pulse 74   Temp 99.9 F (37.7 C)   Resp 17   LMP 01/20/2011   SpO2 94%   Physical Exam Constitutional:      General: She is not in acute distress.    Appearance: Normal appearance. She is well-developed and normal weight. She is not ill-appearing, toxic-appearing or diaphoretic.  HENT:     Head: Normocephalic and atraumatic.     Right Ear: Tympanic membrane, ear canal and external ear normal. No drainage or tenderness. No middle ear effusion. There is no impacted cerumen. Tympanic membrane is not erythematous or bulging.     Left Ear: Tympanic membrane, ear canal and external ear normal. No drainage or tenderness.  No middle ear effusion. There is no impacted cerumen. Tympanic membrane is not erythematous or bulging.     Nose: Nose normal. No congestion or rhinorrhea.     Mouth/Throat:     Mouth: Mucous membranes are moist. No oral lesions.     Pharynx: No pharyngeal swelling, oropharyngeal exudate, posterior oropharyngeal erythema or uvula swelling.     Tonsils: No tonsillar exudate or tonsillar abscesses.  Eyes:     General: No scleral icterus.       Right eye: No discharge.        Left eye: No discharge.     Extraocular Movements: Extraocular movements intact.     Right  eye: Normal extraocular motion.     Left eye: Normal extraocular motion.     Conjunctiva/sclera: Conjunctivae normal.  Cardiovascular:     Rate and Rhythm: Normal rate and regular rhythm.     Heart sounds: Normal heart sounds. No murmur heard.    No friction rub. No gallop.  Pulmonary:     Effort: Pulmonary effort is normal. No respiratory distress.     Breath sounds: No stridor. No wheezing, rhonchi or rales.  Chest:     Chest wall: No tenderness.  Musculoskeletal:     Cervical back: Normal range of motion and neck supple.  Lymphadenopathy:     Cervical: No cervical adenopathy.  Skin:    General: Skin is warm and dry.  Neurological:     General: No focal deficit present.     Mental Status: She is alert and oriented to person, place, and time.  Psychiatric:        Mood and Affect: Mood normal.        Behavior: Behavior normal.     Results for orders placed or performed during the hospital encounter of 08/04/23 (from the past 24 hours)  POC Covid + Flu A/B Antigen     Status: None   Collection Time: 08/04/23 10:48 AM  Result Value Ref Range   Influenza A Antigen, POC Negative Negative   Influenza B Antigen, POC Negative Negative   Covid Antigen, POC Negative Negative    Assessment and Plan :   PDMP not reviewed this encounter.  1. Viral respiratory infection    Deferred imaging given clear cardiopulmonary  exam, hemodynamically stable vital signs.  Suspect viral URI, viral syndrome. Physical exam findings reassuring and vital signs stable for discharge. Advised supportive care, offered symptomatic relief. Counseled patient on potential for adverse effects with medications prescribed/recommended today, ER and return-to-clinic precautions discussed, patient verbalized understanding.     Wallis Bamberg, PA-C 08/04/23 1150

## 2023-08-04 NOTE — Discharge Instructions (Signed)
 We will manage this as a viral respiratory illness. For sore throat or cough try using a honey-based tea. Use 3 teaspoons of honey with juice squeezed from half lemon. Place shaved pieces of ginger into 1/2-1 cup of water and warm over stove top. Then mix the ingredients and repeat every 4 hours as needed. Please take Tylenol 500mg -650mg  once every 6 hours for fevers, aches and pains. Hydrate very well with at least 2 liters (64 ounces) of water. Eat light meals such as soups (chicken and noodles, chicken wild rice, vegetable).  Do not eat any foods that you are allergic to.  Start an antihistamine like Zyrtec (10mg  daily) for postnasal drainage, sinus congestion.  You can take this together with cough syrup and albuterol as needed.

## 2023-08-07 ENCOUNTER — Other Ambulatory Visit: Payer: Self-pay | Admitting: Urgent Care

## 2023-08-07 ENCOUNTER — Ambulatory Visit
Admission: RE | Admit: 2023-08-07 | Discharge: 2023-08-07 | Disposition: A | Discharge status: Home / Self Care | Source: Ambulatory Visit | Attending: Family Medicine | Admitting: Family Medicine

## 2023-08-07 ENCOUNTER — Ambulatory Visit (INDEPENDENT_AMBULATORY_CARE_PROVIDER_SITE_OTHER)

## 2023-08-07 VITALS — BP 124/71 | HR 61 | Temp 99.7°F | Resp 16

## 2023-08-07 DIAGNOSIS — J208 Acute bronchitis due to other specified organisms: Secondary | ICD-10-CM

## 2023-08-07 DIAGNOSIS — R051 Acute cough: Secondary | ICD-10-CM

## 2023-08-07 DIAGNOSIS — J189 Pneumonia, unspecified organism: Secondary | ICD-10-CM | POA: Diagnosis not present

## 2023-08-07 MED ORDER — CEFDINIR 300 MG PO CAPS: 300.0000 mg | ORAL_CAPSULE | Freq: Two times a day (BID) | ORAL | 0 refills | Status: DC

## 2023-08-07 MED ORDER — PREDNISONE 20 MG PO TABS: ORAL_TABLET | ORAL | 0 refills | Status: DC

## 2023-08-07 NOTE — Progress Notes (Signed)
Documentation updated.

## 2023-08-07 NOTE — ED Triage Notes (Signed)
 Pt states fever,cough,and congestion for the past 5 days.  States she was seen her 3 days ago and is not feeling any better.

## 2023-08-07 NOTE — ED Provider Notes (Signed)
 [facility]  Note:  This document was prepared using Conservation officer, historic buildings and may include unintentional dictation errors.  MRN: [id number] DOB: 13-Apr-1966  Subjective:   [name] is a 58 y.o. female presenting for 5 day history of acute onset worsening fever, congestion, chest pain with her coughing.  Tested negative for COVID and flu already.  Her husband has as well.  At her most recent visit on 08/04/2023, we opted for conservative management without steroids.  However, patient would like to consider this as she has taken them despite her bariatric surgery and has not had any issues.  No current facility-administered medications for this encounter.  Current Outpatient Medications:    acetaminophen (TYLENOL) 325 MG tablet, Take 1-2 tablets (325-650 mg total) by mouth every 4 (four) hours as needed for mild pain., Disp: , Rfl:    albuterol (VENTOLIN HFA) 108 (90 Base) MCG/ACT inhaler, Inhale 1-2 puffs into the lungs every 6 (six) hours as needed for wheezing or shortness of breath., Disp: 18 g, Rfl: 0   Biotin 1000 MCG tablet, Take 1,000 mcg by mouth every other day., Disp: , Rfl:    celecoxib (CELEBREX) 200 MG capsule, TAKE 1 CAPSULE BY MOUTH DAILY AS NEEDED FOR PAIN, Disp: 30 capsule, Rfl: 1   cetirizine (ZYRTEC ALLERGY) 10 MG tablet, Take 1 tablet (10 mg total) by mouth daily., Disp: 30 tablet, Rfl: 0   Cholecalciferol (VITAMIN D3) 50 MCG (2000 UT) TABS, Take 2,000 Units by mouth daily., Disp: , Rfl:    COLLAGEN PO, Take by mouth. Liquid collagen with biotin, Disp: , Rfl:    copper tablet, Take 3 tablets (6 mg total) by mouth daily for 7 days, THEN 2 tablets (4 mg total) daily for 7 days, THEN 1 tablet (2 mg total) daily., Disp: 125 tablet, Rfl: 0   cyanocobalamin (VITAMIN B12) 1000 MCG/ML injection, Inject 1 ml into the muscle once a week for a month.  Then inject 1 ml into the muscle once a month thereafter., Disp: 6 mL, Rfl: 11    famotidine (PEPCID) 40 MG tablet, Take 40 mg by mouth 2 (two) times daily., Disp: , Rfl:    fluticasone (FLONASE) 50 MCG/ACT nasal spray, Place 2 sprays into both nostrils daily., Disp: 16 g, Rfl: 6   furosemide (LASIX) 40 MG tablet, Take 1 tablet (40 mg total) by mouth daily., Disp: 90 tablet, Rfl: 3   Glucosamine HCl (GLUCOSAMINE PO), Take 1,000 mg by mouth daily., Disp: , Rfl:    hydrochlorothiazide (HYDRODIURIL) 25 MG tablet, TAKE 1 TABLET (25 MG TOTAL) BY MOUTH DAILY., Disp: 90 tablet, Rfl: 1   Ibuprofen 200 MG CAPS, Take 400 mg by mouth daily as needed (For pain)., Disp: , Rfl:    levothyroxine (SYNTHROID) 88 MCG tablet, TAKE 1 TABLET BY MOUTH EVERY DAY, Disp: 90 tablet, Rfl: 1   loratadine (CLARITIN) 10 MG tablet, TAKE 1 TABLET BY MOUTH EVERY DAY, Disp: 90 tablet, Rfl: 2   Magnesium 250 MG TABS, Take 420 mg by mouth daily. For leg cramps, Disp: , Rfl:    Menthol, Topical Analgesic, (BIOFREEZE EX), Apply 1 application  topically daily as needed (On Knees and feet)., Disp: , Rfl:    metoprolol succinate (TOPROL XL) 25 MG 24 hr tablet, Take 1 tablet (25 mg total) by mouth 2 (two) times daily., Disp: 180 tablet, Rfl: 3   Misc Natural Products (YUMVS BEET ROOT-TART CHERRY) 250-0.5 MG CHEW, Chew 1 tablet by mouth 2 (  two) times a week., Disp: , Rfl:    potassium chloride SA (KLOR-CON M20) 20 MEQ tablet, Take 1 tablet (20 mEq total) by mouth daily., Disp: 90 tablet, Rfl: 3   promethazine-dextromethorphan (PROMETHAZINE-DM) 6.25-15 MG/5ML syrup, Take 5 mLs by mouth 3 (three) times daily as needed for cough., Disp: 200 mL, Rfl: 0   rosuvastatin (CRESTOR) 5 MG tablet, Take 1 tablet (5 mg total) by mouth at bedtime., Disp: 30 tablet, Rfl: 11   SYRINGE-NEEDLE, DISP, 3 ML (BD SAFETYGLIDE SYRINGE/NEEDLE) 25G X 1" 3 ML MISC, Use for B12 injections, Disp: 100 each, Rfl: 3   Turmeric 500 MG CAPS, Take 500 mg by mouth as needed. 3 times weekly, 1 tab po, Disp: , Rfl:    valsartan (DIOVAN) 160 MG tablet, Take 1  tablet (160 mg total) by mouth daily., Disp: 90 tablet, Rfl: 3   Allergies  Allergen Reactions   Penicillins Other (See Comments)    Causes severe, difficult to treat yeast infections    Past Medical History:  Diagnosis Date   A-fib (HCC)    Allergic rhinitis, cause unspecified    Allergy 2016   Anemia, iron deficiency    Arthritis    Back pain    CFS (chronic fatigue syndrome)    Chest pain    COVID-19 06/15/2020   Diastolic dysfunction    Diverticulosis of colon    Edema, lower extremity    Fatty liver    Gallbladder problem    GERD (gastroesophageal reflux disease)    Goiter    Hiatal hernia    Hypertension    Hypothyroidism    IBS (irritable bowel syndrome)    Joint pain    Morbid obesity (HCC)    Obstructive sleep apnea    Osteoarthritis    Pacemaker    Sleep apnea    SOB (shortness of breath)    Thrombocytosis    Undiagnosed cardiac murmurs    Vitamin B 12 deficiency    Vitamin D deficiency      Past Surgical History:  Procedure Laterality Date   BARIATRIC SURGERY  06/17/2010   BILATERAL SALPINGECTOMY Bilateral 06/22/2014   Procedure: BILATERAL SALPINGECTOMY;  Surgeon: Jaymes Graff, MD;  Location: [facility];  Service: Gynecology;  Laterality: Bilateral;   CYSTOSCOPY N/A 06/22/2014   Procedure: CYSTOSCOPY;  Surgeon: Jaymes Graff, MD;  Location: [facility];  Service: Gynecology;  Laterality: N/A;   ENDOMETRIAL ABLATION  04/2007   FOOT SURGERY Left 11/24/2017   Dr Charlsie Merles   LAPAROSCOPIC ASSISTED VAGINAL HYSTERECTOMY N/A 06/22/2014   Procedure: LAPAROSCOPIC ASSISTED VAGINAL HYSTERECTOMY;  Surgeon: Jaymes Graff, MD;  Location: [facility];  Service: Gynecology;  Laterality: N/A;   LEAD REVISION/REPAIR N/A 12/17/2021   Procedure: LEAD REVISION/REPAIR;  Surgeon: Marinus Maw, MD;  Location: [facility];  Service: Cardiovascular;  Laterality: N/A;   LIPOMA EXCISION     upper back   PACEMAKER IMPLANT N/A 12/09/2021   Procedure: PACEMAKER IMPLANT;  Surgeon:  Marinus Maw, MD;  Location: [facility];  Service: Cardiovascular;  Laterality: N/A;   SPINE SURGERY  02/10/2012   L4-l5 fusion--cohen    Family History  Problem Relation Age of Onset   Breast cancer Mother 70   Arrhythmia Mother    Heart disease Mother    Arthritis Mother    Diabetes Mother    Hyperlipidemia Mother    Hypertension Mother    Thyroid disease Mother    Anxiety disorder Mother    Hypertension Father  Kidney cancer Father    Colon polyps Father    Lymphoma Father        Diffused Large B-cell Lymphoma   Cancer Father    Obesity Father    Stroke Maternal Grandmother 69   Ovarian cancer Paternal Grandmother    Healthy Daughter    Healthy Daughter    Healthy Daughter    Breast cancer Maternal Aunt 58   Colon cancer Neg Hx     Social History   Tobacco Use   Smoking status: Never    Passive exposure: Never   Smokeless tobacco: Never   Tobacco comments:    Never Used Tobacco  Vaping Use   Vaping status: Never Used  Substance Use Topics   Alcohol use: No   Drug use: No    ROS   Objective:   Vitals: BP 124/71 (BP Location: Right Arm)   Pulse 61   Temp 99.7 F (37.6 C) (Oral)   Resp 16   LMP 01/20/2011   SpO2 92%   Physical Exam Constitutional:      General: She is not in acute distress.    Appearance: Normal appearance. She is well-developed. She is not ill-appearing, toxic-appearing or diaphoretic.  HENT:     Head: Normocephalic and atraumatic.     Nose: Nose normal.     Mouth/Throat:     Mouth: Mucous membranes are moist.  Eyes:     General: No scleral icterus.       Right eye: No discharge.        Left eye: No discharge.     Extraocular Movements: Extraocular movements intact.  Cardiovascular:     Rate and Rhythm: Normal rate and regular rhythm.     Heart sounds: Normal heart sounds. No murmur heard.    No friction rub. No gallop.  Pulmonary:     Effort: Pulmonary effort is normal. No respiratory distress.     Breath  sounds: No stridor. No wheezing, rhonchi or rales.  Chest:     Chest wall: No tenderness.  Skin:    General: Skin is warm and dry.  Neurological:     General: No focal deficit present.     Mental Status: She is alert and oriented to person, place, and time.  Psychiatric:        Mood and Affect: Mood normal.        Behavior: Behavior normal.    DG Chest 2 View Result Date: 08/07/2023 CLINICAL DATA:  Fever, cough and congestion over the last 5 days. EXAM: CHEST - 2 VIEW COMPARISON:  01/13/2022 FINDINGS: Dual lead pacemaker in place. Heart size is normal. Mediastinal shadows are normal. Mild patchy density at both lung bases, left worse than right, consistent with mild bibasilar pneumonia. No lobar consolidation or collapse. No visible effusion. IMPRESSION: Mild patchy density at both lung bases, left worse than right, consistent with mild bibasilar pneumonia. Electronically Signed   By: Paulina Fusi M.D.   On: 08/07/2023 13:36     Assessment and Plan :   PDMP not reviewed this encounter.  1. Community acquired pneumonia, bilateral   2. Acute cough   3. Acute viral bronchitis    Recommended double coverage with cefdinir, azithromycin for her pneumonia given the x-ray findings.  Use supportive care otherwise.  Counseled patient on potential for adverse effects with medications prescribed/recommended today, ER and return-to-clinic precautions discussed, patient verbalized understanding.    Wallis Bamberg, [place] 08/07/23 [phone]

## 2023-08-10 ENCOUNTER — Telehealth: Payer: Self-pay | Admitting: Internal Medicine

## 2023-08-10 NOTE — Telephone Encounter (Signed)
 She was seen here in 2016 and saw Dr. Loreta Ave in 2019.  Who was she not happy with?

## 2023-08-10 NOTE — Telephone Encounter (Signed)
 Good morning Dr. Marina Goodell   We received a call from this patient wishing to schedule an appointment with a different provider due to loose stools. Patient states she would like to schedule with a different provider due to not being happy with the care provided previously. Would you be okay with this transfer?

## 2023-08-11 ENCOUNTER — Inpatient Hospital Stay: Attending: Hematology & Oncology

## 2023-08-11 ENCOUNTER — Encounter (INDEPENDENT_AMBULATORY_CARE_PROVIDER_SITE_OTHER): Payer: Self-pay

## 2023-08-11 VITALS — BP 118/66 | HR 59 | Temp 97.6°F | Resp 20

## 2023-08-11 DIAGNOSIS — D509 Iron deficiency anemia, unspecified: Secondary | ICD-10-CM | POA: Insufficient documentation

## 2023-08-11 DIAGNOSIS — D508 Other iron deficiency anemias: Secondary | ICD-10-CM

## 2023-08-11 MED ORDER — IRON SUCROSE 20 MG/ML IV SOLN
200.0000 mg | Freq: Once | INTRAVENOUS | Status: AC
  Administered 2023-08-11: 200 mg via INTRAVENOUS
  Filled 2023-08-11: qty 10

## 2023-08-11 MED ORDER — SODIUM CHLORIDE 0.9 % IV SOLN: Freq: Once | INTRAVENOUS | Status: AC

## 2023-08-11 NOTE — Patient Instructions (Signed)

## 2023-08-13 NOTE — Telephone Encounter (Signed)
 My apologies, I was not aware that the patient had seen another provider. She is wishing to be seen at our office but not under your care. Would you be okay with this transfer?  Thank you.

## 2023-08-14 NOTE — Telephone Encounter (Signed)
 Given that, I would not assume her care. Care with another provider would need to be approved by that provider. Good luck.

## 2023-08-18 ENCOUNTER — Other Ambulatory Visit: Payer: Self-pay | Admitting: Cardiology

## 2023-08-18 ENCOUNTER — Inpatient Hospital Stay

## 2023-08-18 ENCOUNTER — Other Ambulatory Visit: Payer: Self-pay | Admitting: Internal Medicine

## 2023-08-18 VITALS — BP 130/57 | HR 60 | Temp 97.8°F | Resp 18

## 2023-08-18 DIAGNOSIS — D508 Other iron deficiency anemias: Secondary | ICD-10-CM

## 2023-08-18 DIAGNOSIS — M2550 Pain in unspecified joint: Secondary | ICD-10-CM

## 2023-08-18 DIAGNOSIS — E039 Hypothyroidism, unspecified: Secondary | ICD-10-CM

## 2023-08-18 DIAGNOSIS — D509 Iron deficiency anemia, unspecified: Secondary | ICD-10-CM | POA: Diagnosis not present

## 2023-08-18 DIAGNOSIS — I10 Essential (primary) hypertension: Secondary | ICD-10-CM

## 2023-08-18 MED ORDER — SODIUM CHLORIDE 0.9 % IV SOLN: Freq: Once | INTRAVENOUS | Status: AC

## 2023-08-18 MED ORDER — IRON SUCROSE 20 MG/ML IV SOLN
200.0000 mg | Freq: Once | INTRAVENOUS | Status: AC
  Administered 2023-08-18: 200 mg via INTRAVENOUS
  Filled 2023-08-18: qty 10

## 2023-08-18 NOTE — Patient Instructions (Signed)

## 2023-08-21 ENCOUNTER — Encounter (INDEPENDENT_AMBULATORY_CARE_PROVIDER_SITE_OTHER): Payer: Self-pay | Admitting: Internal Medicine

## 2023-08-24 ENCOUNTER — Ambulatory Visit (INDEPENDENT_AMBULATORY_CARE_PROVIDER_SITE_OTHER): Payer: No Typology Code available for payment source | Admitting: Internal Medicine

## 2023-08-25 ENCOUNTER — Inpatient Hospital Stay: Attending: Hematology & Oncology

## 2023-08-25 VITALS — BP 125/59 | HR 59

## 2023-08-25 DIAGNOSIS — D509 Iron deficiency anemia, unspecified: Secondary | ICD-10-CM | POA: Diagnosis present

## 2023-08-25 DIAGNOSIS — D508 Other iron deficiency anemias: Secondary | ICD-10-CM

## 2023-08-25 MED ORDER — SODIUM CHLORIDE 0.9 % IV SOLN: Freq: Once | INTRAVENOUS | Status: AC

## 2023-08-25 MED ORDER — IRON SUCROSE 20 MG/ML IV SOLN
200.0000 mg | Freq: Once | INTRAVENOUS | Status: AC
  Administered 2023-08-25: 200 mg via INTRAVENOUS
  Filled 2023-08-25: qty 10

## 2023-08-25 NOTE — Patient Instructions (Signed)

## 2023-08-27 ENCOUNTER — Ambulatory Visit: Admitting: Adult Health

## 2023-08-27 ENCOUNTER — Ambulatory Visit: Payer: Self-pay

## 2023-08-27 VITALS — BP 120/80 | HR 64 | Temp 98.1°F | Ht 63.0 in | Wt 246.0 lb

## 2023-08-27 DIAGNOSIS — R300 Dysuria: Secondary | ICD-10-CM | POA: Diagnosis not present

## 2023-08-27 LAB — POCT URINALYSIS DIPSTICK
Bilirubin, UA: NEGATIVE
Blood, UA: NEGATIVE
Glucose, UA: NEGATIVE
Ketones, UA: NEGATIVE
Nitrite, UA: NEGATIVE
Protein, UA: NEGATIVE
Spec Grav, UA: 1.015 (ref 1.010–1.025)
Urobilinogen, UA: 0.2 U/dL
pH, UA: 6.5 (ref 5.0–8.0)

## 2023-08-27 MED ORDER — NITROFURANTOIN MONOHYD MACRO 100 MG PO CAPS: 100.0000 mg | ORAL_CAPSULE | Freq: Two times a day (BID) | ORAL | 0 refills | Status: DC

## 2023-08-27 NOTE — Telephone Encounter (Signed)
 Copied from CRM [id number]. Topic: Clinical - Red Word Triage >> Aug 27, 2023  8:07 AM Kathryne Eriksson wrote: Red Word that prompted transfer to Nurse Triage: Severe Pain >> Aug 27, 2023  8:10 AM Kathryne Eriksson wrote: Patient has concerns of possible UTI. States she's having some back pains, frequent urination, burning and irritation. Patient states pain is 7/10   Chief Complaint: urination pain, back pain Symptoms: increased frequency to urinate, pain & burning with urination & irritation, mid back pain Frequency: Tuesday Pertinent Negatives: Patient denies fever Disposition: [] ED /[] Urgent Care (no appt availability in office) / [x] Appointment(In office/virtual)/ []  [facility]/ [] Home Care/ [] Refused Recommended Disposition /[] [place] [facility]/ []  Follow-up with PCP Additional Notes: pt states back hurts even to bend over.  Possible blood in urine at times  Reason for Disposition  [1] MODERATE back pain (e.g., interferes with normal activities) AND [2] present > 3 days  [1] Painful urination AND [2] EITHER frequency or urgency AND [3] has on-call doctor  Answer Assessment - Initial Assessment Questions 1. SEVERITY: "How bad is the pain?"  (e.g., Scale 1-10; mild, moderate, or severe)   - MILD (1-3): complains slightly about urination hurting   - MODERATE (4-7): interferes with normal activities     - SEVERE (8-10): excruciating, unwilling or unable to urinate because of the pain      moderate 2. FREQUENCY: "How many times have you had painful urination today?"      Several -increased frequency to urinate 3. PATTERN: "Is pain present every time you urinate or just sometimes?"      yes 4. ONSET: "When did the painful urination start?"      Tuesday 5. FEVER: "Do you have a fever?" If Yes, ask: "What is your temperature, how was it measured, and when did it start?"     no 6. PAST UTI: "Have you had a urine infection before?" If Yes, ask: "When was the last time?" and "What  happened that time?"      Yes-  7. CAUSE: "What do you think is causing the painful urination?"  (e.g., UTI, scratch, Herpes sore)     N/a 8. OTHER SYMPTOMS: "Do you have any other symptoms?" (e.g., blood in urine, flank pain, genital sores, urgency, vaginal discharge)     Possible blood in urine,  9. PREGNANCY: "Is there any chance you are pregnant?" "When was your last menstrual period?"     N/a  Answer Assessment - Initial Assessment Questions 1. ONSET: "When did the pain begin?"      X 2 weeks 2. LOCATION: "Where does it hurt?" (upper, mid or lower back)     Mid back pain 3. SEVERITY: "How bad is the pain?"  (e.g., Scale 1-10; mild, moderate, or severe)   - MILD (1-3): Doesn't interfere with normal activities.    - MODERATE (4-7): Interferes with normal activities or awakens from sleep.    - SEVERE (8-10): Excruciating pain, unable to do any normal activities.      6/10 4. PATTERN: "Is the pain constant?" (e.g., yes, no; constant, intermittent)      constant 5. RADIATION: "Does the pain shoot into your legs or somewhere else?"     no 6. CAUSE:  "What do you think is causing the back pain?"      unknown 7. BACK OVERUSE:  "Any recent lifting of heavy objects, strenuous work or exercise?"     no 8. MEDICINES: "What have you taken so far for  the pain?" (e.g., nothing, acetaminophen, NSAIDS)     tylenol 9. NEUROLOGIC SYMPTOMS: "Do you have any weakness, numbness, or problems with bowel/bladder control?"     no 10. OTHER SYMPTOMS: "Do you have any other symptoms?" (e.g., fever, abdomen pain, burning with urination, blood in urine)       Burning with urination, possible blood in urine 11. PREGNANCY: "Is there any chance you are pregnant?" "When was your last menstrual period?"       N/a  Protocols used: Urination Pain - Female-A-AH, Back Pain-A-AH

## 2023-08-27 NOTE — Progress Notes (Signed)
 Subjective:    Patient ID: [name], female    DOB: May 03, 1966, 58 y.o.   MRN: [id number]  She does drink sodas but does drink coffee but this is usually decaf.   Dysuria  This is a new problem. The current episode started in the past 7 days (2 days). The problem occurs every urination. The problem has been unchanged. The pain is mild. There has been no fever. There is No history of pyelonephritis. Associated symptoms include a discharge, frequency, hesitancy and urgency. Pertinent negatives include no chills, hematuria or sweats. Associated symptoms comments: Low back pain  . She has tried NSAIDs (AZO) for the symptoms. The treatment provided mild relief. There is no history of kidney stones or recurrent UTIs.   Review of Systems  Constitutional:  Negative for chills.  Genitourinary:  Positive for frequency, hesitancy and urgency. Negative for hematuria.   Past Medical History:  Diagnosis Date   A-fib [facility])    Allergic rhinitis, cause unspecified    Allergy 2016   Anemia, iron deficiency    Arthritis    Back pain    CFS (chronic fatigue syndrome)    Chest pain    COVID-19 06/15/2020   Diastolic dysfunction    Diverticulosis of colon    Edema, lower extremity    Fatty liver    Gallbladder problem    GERD (gastroesophageal reflux disease)    Goiter    Hiatal hernia    Hypertension    Hypothyroidism    IBS (irritable bowel syndrome)    Joint pain    Morbid obesity (HCC)    Obstructive sleep apnea    Osteoarthritis    Pacemaker    Sleep apnea    SOB (shortness of breath)    Thrombocytosis    Undiagnosed cardiac murmurs    Vitamin B 12 deficiency    Vitamin D deficiency     Social History   Socioeconomic History   Marital status: Married    Spouse name: Earl Lites   Number of children: 3   Years of education: Some college   Highest education level: 12th grade  Occupational History   Occupation: Herbalist: UNEMPLOYED   Occupation: stay at  home mom  Tobacco Use   Smoking status: Never    Passive exposure: Never   Smokeless tobacco: Never   Tobacco comments:    Never Used Tobacco  Vaping Use   Vaping status: Never Used  Substance and Sexual Activity   Alcohol use: No   Drug use: No   Sexual activity: Yes    Partners: Male    Birth control/protection: Surgical    Comment: Hysterectomy  Other Topics Concern   Not on file  Social History Narrative   09/21/19   From: [place], [place] originally   Living: with husband, Earl Lites and 2 daughters w/ family   Work: homemaker      Family: 3 children - [place], [place], and Hospital doctor. Has 8 grandchildren and one on the way      Enjoys: spend time with family, shopping      Exercise: trying to walk more   Diet: eats late at night, will go most of the day w/o eating      Safety   Seat belts: Yes    Guns: Yes  and secure   Safe in relationships: Yes    Social Drivers of Health   Financial Resource Strain: Low Risk  (03/15/2023)   Overall Physicist, medical  Strain (CARDIA)    Difficulty of Paying Living Expenses: Not hard at all  Food Insecurity: No Food Insecurity (03/15/2023)   Hunger Vital Sign    Worried About Running Out of Food in the Last Year: Never true    Ran Out of Food in the Last Year: Never true  Transportation Needs: No Transportation Needs (03/15/2023)   PRAPARE - Administrator, Civil Service (Medical): No    Lack of Transportation (Non-Medical): No  Physical Activity: Insufficiently Active (03/15/2023)   Exercise Vital Sign    Days of Exercise per Week: 1 day    Minutes of Exercise per Session: 10 min  Stress: No Stress Concern Present (03/15/2023)   [employer] of Occupational Health - Occupational Stress Questionnaire    Feeling of Stress : Not at all  Social Connections: Moderately Integrated (03/15/2023)   Social Connection and Isolation Panel [NHANES]    Frequency of Communication with Friends and Family: More than three times  a week    Frequency of Social Gatherings with Friends and Family: Twice a week    Attends Religious Services: More than 4 times per year    Active Member of [employer] or Organizations: No    Attends Engineer, structural: Not on file    Marital Status: Married  Catering manager Violence: Not on file    Past Surgical History:  Procedure Laterality Date   BARIATRIC SURGERY  06/17/2010   BILATERAL SALPINGECTOMY Bilateral 06/22/2014   Procedure: BILATERAL SALPINGECTOMY;  Surgeon: Jaymes Graff, MD;  Location: [facility];  Service: Gynecology;  Laterality: Bilateral;   CYSTOSCOPY N/A 06/22/2014   Procedure: CYSTOSCOPY;  Surgeon: Jaymes Graff, MD;  Location: [facility];  Service: Gynecology;  Laterality: N/A;   ENDOMETRIAL ABLATION  04/2007   FOOT SURGERY Left 11/24/2017   Dr Charlsie Merles   LAPAROSCOPIC ASSISTED VAGINAL HYSTERECTOMY N/A 06/22/2014   Procedure: LAPAROSCOPIC ASSISTED VAGINAL HYSTERECTOMY;  Surgeon: Jaymes Graff, MD;  Location: [facility];  Service: Gynecology;  Laterality: N/A;   LEAD REVISION/REPAIR N/A 12/17/2021   Procedure: LEAD REVISION/REPAIR;  Surgeon: Marinus Maw, MD;  [facility];  Service: Cardiovascular;  Laterality: N/A;   LIPOMA EXCISION     upper back   PACEMAKER IMPLANT N/A 12/09/2021   Procedure: PACEMAKER IMPLANT;  Surgeon: Marinus Maw, MD;  [facility];  Service: Cardiovascular;  Laterality: N/A;   SPINE SURGERY  02/10/2012   L4-l5 fusion--cohen    Family History  Problem Relation Age of Onset   Breast cancer Mother 59   Arrhythmia Mother    Heart disease Mother    Arthritis Mother    Diabetes Mother    Hyperlipidemia Mother    Hypertension Mother    Thyroid disease Mother    Anxiety disorder Mother    Hypertension Father    Kidney cancer Father    Colon polyps Father    Lymphoma Father        Diffused Large B-cell Lymphoma   Cancer Father    Obesity Father    Stroke Maternal Grandmother 53   Ovarian cancer  Paternal Grandmother    Healthy Daughter    Healthy Daughter    Healthy Daughter    Breast cancer Maternal Aunt 58   Colon cancer Neg Hx     Allergies  Allergen Reactions   Penicillins Other (See Comments)    Causes severe, difficult to treat yeast infections    Current Outpatient Medications on File Prior to  Visit  Medication Sig Dispense Refill   acetaminophen (TYLENOL) 325 MG tablet Take 1-2 tablets (325-650 mg total) by mouth every 4 (four) hours as needed for mild pain.     albuterol (VENTOLIN HFA) 108 (90 Base) MCG/ACT inhaler Inhale 1-2 puffs into the lungs every 6 (six) hours as needed for wheezing or shortness of breath. 18 g 0   Biotin 1000 MCG tablet Take 1,000 mcg by mouth every other day.     cefdinir (OMNICEF) 300 MG capsule Take 1 capsule (300 mg total) by mouth 2 (two) times daily. 20 capsule 0   celecoxib (CELEBREX) 200 MG capsule TAKE 1 CAPSULE BY MOUTH DAILY AS NEEDED FOR PAIN 30 capsule 1   cetirizine (ZYRTEC ALLERGY) 10 MG tablet Take 1 tablet (10 mg total) by mouth daily. 30 tablet 0   Cholecalciferol (VITAMIN D3) 50 MCG (2000 UT) TABS Take 2,000 Units by mouth daily.     COLLAGEN PO Take by mouth. Liquid collagen with biotin     copper tablet Take 3 tablets (6 mg total) by mouth daily for 7 days, THEN 2 tablets (4 mg total) daily for 7 days, THEN 1 tablet (2 mg total) daily. 125 tablet 0   cyanocobalamin (VITAMIN B12) 1000 MCG/ML injection Inject 1 ml into the muscle once a week for a month.  Then inject 1 ml into the muscle once a month thereafter. 6 mL 11   famotidine (PEPCID) 40 MG tablet Take 40 mg by mouth 2 (two) times daily.     fluticasone (FLONASE) 50 MCG/ACT nasal spray Place 2 sprays into both nostrils daily. 16 g 6   furosemide (LASIX) 40 MG tablet Take 1 tablet (40 mg total) by mouth daily. 90 tablet 3   Glucosamine HCl (GLUCOSAMINE PO) Take 1,000 mg by mouth daily.     hydrochlorothiazide (HYDRODIURIL) 25 MG tablet TAKE 1 TABLET (25 MG TOTAL) BY  MOUTH DAILY. 90 tablet 0   Ibuprofen 200 MG CAPS Take 400 mg by mouth daily as needed (For pain).     levothyroxine (SYNTHROID) 88 MCG tablet TAKE 1 TABLET BY MOUTH EVERY DAY 90 tablet 0   loratadine (CLARITIN) 10 MG tablet TAKE 1 TABLET BY MOUTH EVERY DAY 90 tablet 2   Magnesium 250 MG TABS Take 420 mg by mouth daily. For leg cramps     Menthol, Topical Analgesic, (BIOFREEZE EX) Apply 1 application  topically daily as needed (On Knees and feet).     metoprolol succinate (TOPROL XL) 25 MG 24 hr tablet Take 1 tablet (25 mg total) by mouth 2 (two) times daily. 180 tablet 3   Misc Natural Products (YUMVS BEET ROOT-TART CHERRY) 250-0.5 MG CHEW Chew 1 tablet by mouth 2 (two) times a week.     potassium chloride SA (KLOR-CON M20) 20 MEQ tablet Take 1 tablet (20 mEq total) by mouth daily. 90 tablet 3   predniSONE (DELTASONE) 20 MG tablet Take 2 tablets daily with breakfast. 10 tablet 0   promethazine-dextromethorphan (PROMETHAZINE-DM) 6.25-15 MG/5ML syrup Take 5 mLs by mouth 3 (three) times daily as needed for cough. 200 mL 0   rosuvastatin (CRESTOR) 5 MG tablet Take 1 tablet (5 mg total) by mouth at bedtime. 30 tablet 11   SYRINGE-NEEDLE, DISP, 3 ML (BD SAFETYGLIDE SYRINGE/NEEDLE) 25G X 1" 3 ML MISC Use for B12 injections 100 each 3   Turmeric 500 MG CAPS Take 500 mg by mouth as needed. 3 times weekly, 1 tab po     valsartan (DIOVAN)  160 MG tablet TAKE 1 TABLET BY MOUTH EVERY DAY 90 tablet 3   No current facility-administered medications on file prior to visit.    BP 120/80   Pulse 64   Temp 98.1 F (36.7 C) (Oral)   Ht 5\' 3"  (1.6 m)   Wt 246 lb (111.6 kg)   LMP 01/20/2011   SpO2 98%   BMI 43.58 kg/m       Objective:   Physical Exam Vitals and nursing note reviewed.  Constitutional:      Appearance: Normal appearance.  Cardiovascular:     Rate and Rhythm: Normal rate and regular rhythm.     Pulses: Normal pulses.     Heart sounds: Normal heart sounds.  Pulmonary:     Effort:  Pulmonary effort is normal.     Breath sounds: Normal breath sounds.  Abdominal:     General: Abdomen is flat. Bowel sounds are normal.     Palpations: Abdomen is soft.     Tenderness: There is abdominal tenderness in the suprapubic area. There is no right CVA tenderness or left CVA tenderness.  Musculoskeletal:        General: Normal range of motion.  Skin:    General: Skin is warm and dry.  Neurological:     General: No focal deficit present.     Mental Status: She is alert and oriented to person, place, and time.  Psychiatric:        Mood and Affect: Mood normal.        Behavior: Behavior normal.        Thought Content: Thought content normal.        Judgment: Judgment normal.        Assessment & Plan:  1. Dysuria (Primary)  - POC Urinalysis Dipstick + leukocytes but no nitrites. Will treat due to symptoms with Macrobid.  - Urine Culture; Future - Urine Culture - nitrofurantoin, macrocrystal-monohydrate, (MACROBID) 100 MG capsule; Take 1 capsule (100 mg total) by mouth 2 (two) times daily.  Dispense: 10 capsule; Refill: 0  Shirline Frees, NP   Time spent with patient today was 31 minutes which consisted of chart review, discussing  dysuria, work up, treatment answering questions and documentation.

## 2023-08-29 LAB — URINE CULTURE
MICRO NUMBER:: 16285335
SPECIMEN QUALITY:: ADEQUATE

## 2023-08-31 ENCOUNTER — Encounter (INDEPENDENT_AMBULATORY_CARE_PROVIDER_SITE_OTHER): Payer: Self-pay

## 2023-09-01 ENCOUNTER — Encounter: Payer: Self-pay | Admitting: Adult Health

## 2023-09-01 ENCOUNTER — Inpatient Hospital Stay

## 2023-09-01 VITALS — BP 127/67 | HR 60 | Temp 97.5°F | Resp 17

## 2023-09-01 DIAGNOSIS — D508 Other iron deficiency anemias: Secondary | ICD-10-CM

## 2023-09-01 DIAGNOSIS — D509 Iron deficiency anemia, unspecified: Secondary | ICD-10-CM | POA: Diagnosis not present

## 2023-09-01 MED ORDER — SODIUM CHLORIDE 0.9 % IV SOLN: Freq: Once | INTRAVENOUS | Status: AC

## 2023-09-01 MED ORDER — IRON SUCROSE 20 MG/ML IV SOLN
200.0000 mg | Freq: Once | INTRAVENOUS | Status: AC
  Administered 2023-09-01: 200 mg via INTRAVENOUS
  Filled 2023-09-01: qty 10

## 2023-09-01 NOTE — Telephone Encounter (Signed)
 FYI

## 2023-09-01 NOTE — Patient Instructions (Signed)

## 2023-09-02 NOTE — Telephone Encounter (Signed)
 Good morning Dr. Doy Hutching, I received a call from this patient requesting to be seen by a female provider regarding having loose stool. It looks like she was seen in office in 2016. One of my colleagues spoke already with Dr. Marina Goodell regarding transferring her care over to a female provider. Would you please advise on scheduling this patient.   Thank you.

## 2023-09-03 NOTE — Telephone Encounter (Signed)
 Patient advised.

## 2023-09-04 ENCOUNTER — Encounter: Payer: Self-pay | Admitting: Adult Health

## 2023-09-04 ENCOUNTER — Telehealth: Admitting: Adult Health

## 2023-09-04 VITALS — Ht 63.0 in | Wt 246.0 lb

## 2023-09-04 DIAGNOSIS — N3000 Acute cystitis without hematuria: Secondary | ICD-10-CM

## 2023-09-04 MED ORDER — CIPROFLOXACIN HCL 500 MG PO TABS
500.0000 mg | ORAL_TABLET | Freq: Two times a day (BID) | ORAL | 0 refills | Status: AC
Start: 2023-09-04 — End: 2023-09-09

## 2023-09-04 NOTE — Progress Notes (Signed)
 Virtual Visit via Video Note  I connected with [name]  on 09/04/23 at  1:30 PM EDT by a video enabled telemedicine application and verified that I am speaking with the correct person using two identifiers.  Location patient: home Location provider:work or home office Persons participating in the virtual visit: patient, provider  I discussed the limitations of evaluation and management by telemedicine and the availability of in person appointments. The patient expressed understanding and agreed to proceed.   HPI: She is a pleasant 58 year old female who  has a past medical history of A-fib (HCC), Allergic rhinitis, cause unspecified, Allergy (2016), Anemia, iron deficiency, Arthritis, Back pain, CFS (chronic fatigue syndrome), Chest pain, COVID-19 (06/15/2020), Diastolic dysfunction, Diverticulosis of colon, Edema, lower extremity, Fatty liver, Gallbladder problem, GERD (gastroesophageal reflux disease), Goiter, Hiatal hernia, Hypertension, Hypothyroidism, IBS (irritable bowel syndrome), Joint pain, Morbid obesity (HCC), Obstructive sleep apnea, Osteoarthritis, Pacemaker, Sleep apnea, SOB (shortness of breath), Thrombocytosis, Undiagnosed cardiac murmurs, Vitamin B 12 deficiency, and Vitamin D deficiency.  She is being evaluated today for follow-up regarding dysuria.  She was seen by myself 8 days ago with dysuria,  urinary frequency, hesitancy, and urgency.  Her POC urinalysis was positive for leukocytes but negative for nitrates.  Due to symptoms she was started on Macrobid, urine culture did show sensitivity to Macrobid she took her prescription twice a day for 5 days.  He was feeling better until a day after she finished her antibiotics when dysuria returned. She has also developed low-grade fever up to 99.8 on and off as well as chills. No other UTI symptoms are present currently. She is leaving tomorrow for a family vacation in [place]   ROS: See pertinent positives and negatives per  HPI.  Past Medical History:  Diagnosis Date   A-fib [facility])    Allergic rhinitis, cause unspecified    Allergy 2016   Anemia, iron deficiency    Arthritis    Back pain    CFS (chronic fatigue syndrome)    Chest pain    COVID-19 06/15/2020   Diastolic dysfunction    Diverticulosis of colon    Edema, lower extremity    Fatty liver    Gallbladder problem    GERD (gastroesophageal reflux disease)    Goiter    Hiatal hernia    Hypertension    Hypothyroidism    IBS (irritable bowel syndrome)    Joint pain    Morbid obesity (HCC)    Obstructive sleep apnea    Osteoarthritis    Pacemaker    Sleep apnea    SOB (shortness of breath)    Thrombocytosis    Undiagnosed cardiac murmurs    Vitamin B 12 deficiency    Vitamin D deficiency     Past Surgical History:  Procedure Laterality Date   BARIATRIC SURGERY  06/17/2010   BILATERAL SALPINGECTOMY Bilateral 06/22/2014   Procedure: BILATERAL SALPINGECTOMY;  Surgeon: Jaymes Graff, MD;  Location: [facility];  Service: Gynecology;  Laterality: Bilateral;   CYSTOSCOPY N/A 06/22/2014   Procedure: CYSTOSCOPY;  Surgeon: Jaymes Graff, MD;  Location: [facility];  Service: Gynecology;  Laterality: N/A;   ENDOMETRIAL ABLATION  04/2007   FOOT SURGERY Left 11/24/2017   Dr Charlsie Merles   LAPAROSCOPIC ASSISTED VAGINAL HYSTERECTOMY N/A 06/22/2014   Procedure: LAPAROSCOPIC ASSISTED VAGINAL HYSTERECTOMY;  Surgeon: Jaymes Graff, MD;  Location: [facility];  Service: Gynecology;  Laterality: N/A;   LEAD REVISION/REPAIR N/A 12/17/2021   Procedure: LEAD REVISION/REPAIR;  Surgeon: Marinus Maw, MD;  [facility];  Service: Cardiovascular;  Laterality: N/A;   LIPOMA EXCISION     upper back   PACEMAKER IMPLANT N/A 12/09/2021   Procedure: PACEMAKER IMPLANT;  Surgeon: Marinus Maw, MD;  [facility];  Service: Cardiovascular;  Laterality: N/A;   SPINE SURGERY  02/10/2012   L4-l5 fusion--cohen    Family History  Problem Relation Age  of Onset   Breast cancer Mother 58   Arrhythmia Mother    Heart disease Mother    Arthritis Mother    Diabetes Mother    Hyperlipidemia Mother    Hypertension Mother    Thyroid disease Mother    Anxiety disorder Mother    Hypertension Father    Kidney cancer Father    Colon polyps Father    Lymphoma Father        Diffused Large B-cell Lymphoma   Cancer Father    Obesity Father    Stroke Maternal Grandmother 15   Ovarian cancer Paternal Grandmother    Healthy Daughter    Healthy Daughter    Healthy Daughter    Breast cancer Maternal Aunt 58   Colon cancer Neg Hx        Current Outpatient Medications:    acetaminophen (TYLENOL) 325 MG tablet, Take 1-2 tablets (325-650 mg total) by mouth every 4 (four) hours as needed for mild pain., Disp: , Rfl:    albuterol (VENTOLIN HFA) 108 (90 Base) MCG/ACT inhaler, Inhale 1-2 puffs into the lungs every 6 (six) hours as needed for wheezing or shortness of breath., Disp: 18 g, Rfl: 0   Biotin 1000 MCG tablet, Take 1,000 mcg by mouth every other day., Disp: , Rfl:    celecoxib (CELEBREX) 200 MG capsule, TAKE 1 CAPSULE BY MOUTH DAILY AS NEEDED FOR PAIN, Disp: 30 capsule, Rfl: 1   cetirizine (ZYRTEC ALLERGY) 10 MG tablet, Take 1 tablet (10 mg total) by mouth daily., Disp: 30 tablet, Rfl: 0   Cholecalciferol (VITAMIN D3) 50 MCG (2000 UT) TABS, Take 2,000 Units by mouth daily., Disp: , Rfl:    COLLAGEN PO, Take by mouth. Liquid collagen with biotin, Disp: , Rfl:    copper tablet, Take 3 tablets (6 mg total) by mouth daily for 7 days, THEN 2 tablets (4 mg total) daily for 7 days, THEN 1 tablet (2 mg total) daily., Disp: 125 tablet, Rfl: 0   cyanocobalamin (VITAMIN B12) 1000 MCG/ML injection, Inject 1 ml into the muscle once a week for a month.  Then inject 1 ml into the muscle once a month thereafter., Disp: 6 mL, Rfl: 11   famotidine (PEPCID) 40 MG tablet, Take 40 mg by mouth 2 (two) times daily., Disp: , Rfl:    fluticasone (FLONASE) 50 MCG/ACT  nasal spray, Place 2 sprays into both nostrils daily., Disp: 16 g, Rfl: 6   furosemide (LASIX) 40 MG tablet, Take 1 tablet (40 mg total) by mouth daily., Disp: 90 tablet, Rfl: 3   Glucosamine HCl (GLUCOSAMINE PO), Take 1,000 mg by mouth daily., Disp: , Rfl:    hydrochlorothiazide (HYDRODIURIL) 25 MG tablet, TAKE 1 TABLET (25 MG TOTAL) BY MOUTH DAILY., Disp: 90 tablet, Rfl: 0   Ibuprofen 200 MG CAPS, Take 400 mg by mouth daily as needed (For pain)., Disp: , Rfl:    levothyroxine (SYNTHROID) 88 MCG tablet, TAKE 1 TABLET BY MOUTH EVERY DAY, Disp: 90 tablet, Rfl: 0   loratadine (CLARITIN) 10 MG tablet, TAKE 1 TABLET BY MOUTH EVERY DAY,  Disp: 90 tablet, Rfl: 2   Magnesium 250 MG TABS, Take 420 mg by mouth daily. For leg cramps, Disp: , Rfl:    Menthol, Topical Analgesic, (BIOFREEZE EX), Apply 1 application  topically daily as needed (On Knees and feet)., Disp: , Rfl:    metoprolol succinate (TOPROL XL) 25 MG 24 hr tablet, Take 1 tablet (25 mg total) by mouth 2 (two) times daily., Disp: 180 tablet, Rfl: 3   Misc Natural Products (YUMVS BEET ROOT-TART CHERRY) 250-0.5 MG CHEW, Chew 1 tablet by mouth 2 (two) times a week., Disp: , Rfl:    nitrofurantoin, macrocrystal-monohydrate, (MACROBID) 100 MG capsule, Take 1 capsule (100 mg total) by mouth 2 (two) times daily., Disp: 10 capsule, Rfl: 0   potassium chloride SA (KLOR-CON M20) 20 MEQ tablet, Take 1 tablet (20 mEq total) by mouth daily., Disp: 90 tablet, Rfl: 3   rosuvastatin (CRESTOR) 5 MG tablet, Take 1 tablet (5 mg total) by mouth at bedtime., Disp: 30 tablet, Rfl: 11   SYRINGE-NEEDLE, DISP, 3 ML (BD SAFETYGLIDE SYRINGE/NEEDLE) 25G X 1" 3 ML MISC, Use for B12 injections, Disp: 100 each, Rfl: 3   Turmeric 500 MG CAPS, Take 500 mg by mouth as needed. 3 times weekly, 1 tab po, Disp: , Rfl:    valsartan (DIOVAN) 160 MG tablet, TAKE 1 TABLET BY MOUTH EVERY DAY, Disp: 90 tablet, Rfl: 3  EXAM:  VITALS per patient if applicable:  GENERAL: alert, oriented,  appears well and in no acute distress  HEENT: atraumatic, conjunttiva clear, no obvious abnormalities on inspection of external nose and ears  NECK: normal movements of the head and neck  LUNGS: on inspection no signs of respiratory distress, breathing rate appears normal, no obvious gross SOB, gasping or wheezing  CV: no obvious cyanosis  MS: moves all visible extremities without noticeable abnormality  PSYCH/NEURO: pleasant and cooperative, no obvious depression or anxiety, speech and thought processing grossly intact  ASSESSMENT AND PLAN:  Discussed the following assessment and plan:  1. Acute cystitis without hematuria (Primary) - Likely abx failure. Will send in Cipro for her. Advised to take a probiotic with the medication  - ciprofloxacin (CIPRO) 500 MG tablet; Take 1 tablet (500 mg total) by mouth 2 (two) times daily for 5 days.  Dispense: 10 tablet; Refill: 0 - Follow up if not resolved by the end of abx therapy    I discussed the assessment and treatment plan with the patient. The patient was provided an opportunity to ask questions and all were answered. The patient agreed with the plan and demonstrated an understanding of the instructions.   The patient was advised to call back or seek an in-person evaluation if the symptoms worsen or if the condition fails to improve as anticipated.   Shirline Frees, NP

## 2023-09-04 NOTE — Telephone Encounter (Signed)
 Ok to schedule pt?

## 2023-09-04 NOTE — Telephone Encounter (Signed)
 Pt has been scheduled.

## 2023-09-09 ENCOUNTER — Ambulatory Visit (INDEPENDENT_AMBULATORY_CARE_PROVIDER_SITE_OTHER): Payer: 59

## 2023-09-09 DIAGNOSIS — I442 Atrioventricular block, complete: Secondary | ICD-10-CM | POA: Diagnosis not present

## 2023-09-14 ENCOUNTER — Inpatient Hospital Stay

## 2023-09-14 VITALS — BP 118/61 | HR 58 | Temp 97.6°F | Resp 20

## 2023-09-14 DIAGNOSIS — D508 Other iron deficiency anemias: Secondary | ICD-10-CM

## 2023-09-14 DIAGNOSIS — D509 Iron deficiency anemia, unspecified: Secondary | ICD-10-CM | POA: Diagnosis not present

## 2023-09-14 LAB — CUP PACEART REMOTE DEVICE CHECK
Battery Remaining Longevity: 162 mo
Battery Remaining Percentage: 100 %
Brady Statistic RA Percent Paced: 48 %
Brady Statistic RV Percent Paced: 0 %
Date Time Interrogation Session: 20250419113000
Implantable Lead Connection Status: 753985
Implantable Lead Connection Status: 753985
Implantable Lead Implant Date: 20230717
Implantable Lead Implant Date: 20230717
Implantable Lead Location: 753859
Implantable Lead Location: 753860
Implantable Lead Model: 7841
Implantable Lead Model: 7842
Implantable Lead Serial Number: 1204466
Implantable Lead Serial Number: 1298900
Implantable Pulse Generator Implant Date: 20230717
Lead Channel Impedance Value: 549 Ohm
Lead Channel Impedance Value: 691 Ohm
Lead Channel Pacing Threshold Amplitude: 0.7 V
Lead Channel Pacing Threshold Pulse Width: 0.4 ms
Lead Channel Setting Pacing Amplitude: 2.5 V
Lead Channel Setting Pacing Amplitude: 3.3 V
Lead Channel Setting Pacing Pulse Width: 1 ms
Lead Channel Setting Sensing Sensitivity: 1 mV
Pulse Gen Serial Number: 603605
Zone Setting Status: 755011

## 2023-09-14 MED ORDER — IRON SUCROSE 20 MG/ML IV SOLN
200.0000 mg | Freq: Once | INTRAVENOUS | Status: AC
  Administered 2023-09-14: 200 mg via INTRAVENOUS
  Filled 2023-09-14: qty 10

## 2023-09-14 MED ORDER — SODIUM CHLORIDE 0.9 % IV SOLN: Freq: Once | INTRAVENOUS | Status: AC

## 2023-09-14 NOTE — Patient Instructions (Addendum)
 [facility] - A DEPT OF [facility]  Discharge Instructions: Thank you for choosing [facility] to provide your oncology and hematology care.   If you have a lab appointment with the Cancer Center, please go directly to the Cancer Center and check in at the registration area.  Wear comfortable clothing and clothing appropriate for easy access to any Portacath or PICC line.   We strive to give you quality time with your provider. You may need to reschedule your appointment if you arrive late (15 or more minutes).  Arriving late affects you and other patients whose appointments are after yours.  Also, if you miss three or more appointments without notifying the office, you may be dismissed from the clinic at the provider's discretion.      For prescription refill requests, have your pharmacy contact our office and allow 72 hours for refills to be completed.    Today you received the following  agents Venofer      To help prevent nausea and vomiting after your treatment, we encourage you to take your nausea medication as directed.  BELOW ARE SYMPTOMS THAT SHOULD BE REPORTED IMMEDIATELY: *FEVER GREATER THAN 100.4 F (38 C) OR HIGHER *CHILLS OR SWEATING *NAUSEA AND VOMITING THAT IS NOT CONTROLLED WITH YOUR NAUSEA MEDICATION *UNUSUAL SHORTNESS OF BREATH *UNUSUAL BRUISING OR BLEEDING *URINARY PROBLEMS (pain or burning when urinating, or frequent urination) *BOWEL PROBLEMS (unusual diarrhea, constipation, pain near the anus) TENDERNESS IN MOUTH AND THROAT WITH OR WITHOUT PRESENCE OF ULCERS (sore throat, sores in mouth, or a toothache) UNUSUAL RASH, SWELLING OR PAIN  UNUSUAL VAGINAL DISCHARGE OR ITCHING   Items with * indicate a potential emergency and should be followed up as soon as possible or go to the Emergency Department if any problems should occur.  Please show the CHEMOTHERAPY ALERT CARD or IMMUNOTHERAPY ALERT CARD at check-in to the  Emergency Department and triage nurse. Should you have questions after your visit or need to cancel or reschedule your appointment, please contact [facility] - A DEPT OF [facility]  [phone] and follow the prompts.  Office hours are 8:00 a.m. to 4:30 p.m. Monday - Friday. Please note that voicemails left after 4:00 p.m. may not be returned until the following business day.  We are closed weekends and major holidays. You have access to a nurse at all times for urgent questions. Please call the main number to the clinic [phone] and follow the prompts.  For any non-urgent questions, you may also contact your provider using MyChart. We now offer e-Visits for anyone 79 and older to request care online for non-urgent symptoms. For details visit [email or web address].   Also download the MyChart app! Go to the app store, search "MyChart", open the app, select , and log in with your MyChart username and password.

## 2023-09-15 ENCOUNTER — Ambulatory Visit: Payer: No Typology Code available for payment source | Admitting: Internal Medicine

## 2023-09-15 ENCOUNTER — Encounter: Payer: Self-pay | Admitting: Internal Medicine

## 2023-09-22 ENCOUNTER — Ambulatory Visit: Payer: No Typology Code available for payment source | Admitting: Internal Medicine

## 2023-09-22 ENCOUNTER — Encounter: Payer: Self-pay | Admitting: Internal Medicine

## 2023-09-22 VITALS — BP 120/80 | HR 60 | Temp 98.7°F | Wt 243.5 lb

## 2023-09-22 DIAGNOSIS — I4891 Unspecified atrial fibrillation: Secondary | ICD-10-CM

## 2023-09-22 DIAGNOSIS — R7303 Prediabetes: Secondary | ICD-10-CM | POA: Diagnosis not present

## 2023-09-22 DIAGNOSIS — I10 Essential (primary) hypertension: Secondary | ICD-10-CM | POA: Diagnosis not present

## 2023-09-22 DIAGNOSIS — E038 Other specified hypothyroidism: Secondary | ICD-10-CM | POA: Diagnosis not present

## 2023-09-22 DIAGNOSIS — D508 Other iron deficiency anemias: Secondary | ICD-10-CM

## 2023-09-22 LAB — POCT GLYCOSYLATED HEMOGLOBIN (HGB A1C): Hemoglobin A1C: 5.6 % (ref 4.0–5.6)

## 2023-09-22 NOTE — Progress Notes (Signed)
 Established Patient Office Visit     CC/Reason for Visit: Follow-up chronic conditions  HPI: [name] is a 58 y.o. female who is coming in today for the above mentioned reasons. Past Medical History is significant for: Morbid obesity, hypertension, hypothyroidism, essential thrombocytosis, iron  deficiency anemia, OSA, chronic heart block and A-fib status post pacemaker, B12 deficiency.  Had bronchitis and pneumonia in March requiring steroids and antibiotics.  She has improved.  She completed iron  infusions under the care of Dr. Maria Shiner.  She is pacemaker dependent.  She is seeing the weight and wellness clinic.  She is otherwise doing well.   Past Medical/Surgical History: Past Medical History:  Diagnosis Date   A-fib (HCC)    Allergic rhinitis, cause unspecified    Allergy 2016   Anemia, iron  deficiency    Arthritis    Back pain    CFS (chronic fatigue syndrome)    Chest pain    COVID-19 06/15/2020   Diastolic dysfunction    Diverticulosis of colon    Edema, lower extremity    Fatty liver    Gallbladder problem    GERD (gastroesophageal reflux disease)    Goiter    Hiatal hernia    Hypertension    Hypothyroidism    IBS (irritable bowel syndrome)    Joint pain    Morbid obesity (HCC)    Obstructive sleep apnea    Osteoarthritis    Pacemaker    Sleep apnea    SOB (shortness of breath)    Thrombocytosis    Undiagnosed cardiac murmurs    Vitamin B 12 deficiency    Vitamin D  deficiency     Past Surgical History:  Procedure Laterality Date   BARIATRIC SURGERY  06/17/2010   BILATERAL SALPINGECTOMY Bilateral 06/22/2014   Procedure: BILATERAL SALPINGECTOMY;  Surgeon: Marylu Soda, MD;  Location: [facility];  Service: Gynecology;  Laterality: Bilateral;   CYSTOSCOPY N/A 06/22/2014   Procedure: CYSTOSCOPY;  Surgeon: Marylu Soda, MD;  Location: [facility];  Service: Gynecology;  Laterality: N/A;   ENDOMETRIAL ABLATION  04/2007   FOOT SURGERY Left 11/24/2017    Dr Celia Coles   LAPAROSCOPIC ASSISTED VAGINAL HYSTERECTOMY N/A 06/22/2014   Procedure: LAPAROSCOPIC ASSISTED VAGINAL HYSTERECTOMY;  Surgeon: Marylu Soda, MD;  Location: [facility];  Service: Gynecology;  Laterality: N/A;   LEAD REVISION/REPAIR N/A 12/17/2021   Procedure: LEAD REVISION/REPAIR;  Surgeon: Tammie Fall, MD;  Location: [facility];  Service: Cardiovascular;  Laterality: N/A;   LIPOMA EXCISION     upper back   PACEMAKER IMPLANT N/A 12/09/2021   Procedure: PACEMAKER IMPLANT;  Surgeon: Tammie Fall, MD;  Location: [facility];  Service: Cardiovascular;  Laterality: N/A;   SPINE SURGERY  02/10/2012   L4-l5 fusion--cohen    Social History:  reports that she has never smoked. She has never been exposed to tobacco smoke. She has never used smokeless tobacco. She reports that she does not drink alcohol and does not use drugs.  Allergies: Allergies  Allergen Reactions   Penicillins Other (See Comments)    Causes severe, difficult to treat yeast infections    Family History:  Family History  Problem Relation Age of Onset   Breast cancer Mother 50   Arrhythmia Mother    Heart disease Mother    Arthritis Mother    Diabetes Mother    Hyperlipidemia Mother    Hypertension Mother    Thyroid  disease Mother    Anxiety disorder Mother  Hypertension Father    Kidney cancer Father    Colon polyps Father    Lymphoma Father        Diffused Large B-cell Lymphoma   Cancer Father    Obesity Father    Stroke Maternal Grandmother 57   Ovarian cancer Paternal Grandmother    Healthy Daughter    Healthy Daughter    Healthy Daughter    Breast cancer Maternal Aunt 58   Colon cancer Neg Hx      Current Outpatient Medications:    acetaminophen  (TYLENOL ) 325 MG tablet, Take 1-2 tablets (325-650 mg total) by mouth every 4 (four) hours as needed for mild pain., Disp: , Rfl:    albuterol  (VENTOLIN  HFA) 108 (90 Base) MCG/ACT inhaler, Inhale 1-2 puffs into the lungs every 6  (six) hours as needed for wheezing or shortness of breath., Disp: 18 g, Rfl: 0   Biotin 1000 MCG tablet, Take 1,000 mcg by mouth every other day., Disp: , Rfl:    celecoxib  (CELEBREX ) 200 MG capsule, TAKE 1 CAPSULE BY MOUTH DAILY AS NEEDED FOR PAIN, Disp: 30 capsule, Rfl: 1   cetirizine  (ZYRTEC  ALLERGY) 10 MG tablet, Take 1 tablet (10 mg total) by mouth daily., Disp: 30 tablet, Rfl: 0   Cholecalciferol  (VITAMIN D3) 50 MCG (2000 UT) TABS, Take 2,000 Units by mouth daily., Disp: , Rfl:    COLLAGEN PO, Take by mouth. Liquid collagen with biotin, Disp: , Rfl:    cyanocobalamin  (VITAMIN B12) 1000 MCG/ML injection, Inject 1 ml into the muscle once a week for a month.  Then inject 1 ml into the muscle once a month thereafter., Disp: 6 mL, Rfl: 11   famotidine (PEPCID) 40 MG tablet, Take 40 mg by mouth 2 (two) times daily., Disp: , Rfl:    fluticasone  (FLONASE ) 50 MCG/ACT nasal spray, Place 2 sprays into both nostrils daily., Disp: 16 g, Rfl: 6   furosemide  (LASIX ) 40 MG tablet, Take 1 tablet (40 mg total) by mouth daily., Disp: 90 tablet, Rfl: 3   Glucosamine HCl (GLUCOSAMINE PO), Take 1,000 mg by mouth daily., Disp: , Rfl:    hydrochlorothiazide  (HYDRODIURIL ) 25 MG tablet, TAKE 1 TABLET (25 MG TOTAL) BY MOUTH DAILY., Disp: 90 tablet, Rfl: 0   Ibuprofen  200 MG CAPS, Take 400 mg by mouth daily as needed (For pain)., Disp: , Rfl:    levothyroxine  (SYNTHROID ) 88 MCG tablet, TAKE 1 TABLET BY MOUTH EVERY DAY, Disp: 90 tablet, Rfl: 0   loratadine  (CLARITIN ) 10 MG tablet, TAKE 1 TABLET BY MOUTH EVERY DAY, Disp: 90 tablet, Rfl: 2   Magnesium 250 MG TABS, Take 420 mg by mouth daily. For leg cramps, Disp: , Rfl:    Menthol , Topical Analgesic, (BIOFREEZE EX), Apply 1 application  topically daily as needed (On Knees and feet)., Disp: , Rfl:    metoprolol  succinate (TOPROL  XL) 25 MG 24 hr tablet, Take 1 tablet (25 mg total) by mouth 2 (two) times daily., Disp: 180 tablet, Rfl: 3   Misc Natural Products (YUMVS BEET  ROOT-TART CHERRY) 250-0.5 MG CHEW, Chew 1 tablet by mouth 2 (two) times a week., Disp: , Rfl:    nitrofurantoin , macrocrystal-monohydrate, (MACROBID ) 100 MG capsule, Take 1 capsule (100 mg total) by mouth 2 (two) times daily., Disp: 10 capsule, Rfl: 0   potassium chloride  SA (KLOR-CON  M20) 20 MEQ tablet, Take 1 tablet (20 mEq total) by mouth daily., Disp: 90 tablet, Rfl: 3   rosuvastatin  (CRESTOR ) 5 MG tablet, Take 1 tablet (5 mg  total) by mouth at bedtime., Disp: 30 tablet, Rfl: 11   SYRINGE-NEEDLE, DISP, 3 ML (BD SAFETYGLIDE SYRINGE/NEEDLE) 25G X 1" 3 ML MISC, Use for B12 injections, Disp: 100 each, Rfl: 3   Turmeric 500 MG CAPS, Take 500 mg by mouth as needed. 3 times weekly, 1 tab po, Disp: , Rfl:    valsartan  (DIOVAN ) 160 MG tablet, TAKE 1 TABLET BY MOUTH EVERY DAY, Disp: 90 tablet, Rfl: 3  Review of Systems:  Negative unless indicated in HPI.   Physical Exam: Vitals:   09/22/23 0945  BP: 120/80  Pulse: 60  Temp: 98.7 F (37.1 C)  TempSrc: Oral  SpO2: 98%  Weight: 243 lb 8 oz (110.5 kg)    Body mass index is 43.13 kg/m.   Physical Exam Vitals reviewed.  Constitutional:      Appearance: Normal appearance. She is obese.  HENT:     Head: Normocephalic and atraumatic.  Eyes:     Conjunctiva/sclera: Conjunctivae normal.  Cardiovascular:     Rate and Rhythm: Normal rate and regular rhythm.  Pulmonary:     Effort: Pulmonary effort is normal.     Breath sounds: Normal breath sounds.  Skin:    General: Skin is warm and dry.  Neurological:     General: No focal deficit present.     Mental Status: She is alert and oriented to person, place, and time.  Psychiatric:        Mood and Affect: Mood normal.        Behavior: Behavior normal.        Thought Content: Thought content normal.        Judgment: Judgment normal.     Flowsheet Row Office Visit from 09/22/2023 in [facility] [place]  PHQ-9 Total Score 1        Impression and Plan:  Other  specified hypothyroidism  Prediabetes -     POCT glycosylated hemoglobin (Hb A1C)  Iron  deficiency anemia secondary to inadequate dietary iron  intake  Essential hypertension  Atrial fibrillation, unspecified type (HCC)   - Most recent TSH within range, continue levothyroxine . - A1c of 5.6 is improved from previous of 6, continue to work on lifestyle changes. - Blood pressure is well-controlled on current. - Continue pacemaker under the care of cardiology.  Time spent:30 minutes reviewing chart, interviewing and examining patient and formulating plan of care.     Marguerita Shih, MD [facility]

## 2023-09-24 ENCOUNTER — Ambulatory Visit (INDEPENDENT_AMBULATORY_CARE_PROVIDER_SITE_OTHER): Admitting: Internal Medicine

## 2023-09-24 ENCOUNTER — Encounter (INDEPENDENT_AMBULATORY_CARE_PROVIDER_SITE_OTHER): Payer: Self-pay | Admitting: Internal Medicine

## 2023-09-24 VITALS — BP 126/80 | HR 60 | Temp 97.6°F | Ht 63.0 in | Wt 240.0 lb

## 2023-09-24 DIAGNOSIS — Z9884 Bariatric surgery status: Secondary | ICD-10-CM

## 2023-09-24 DIAGNOSIS — I110 Hypertensive heart disease with heart failure: Secondary | ICD-10-CM

## 2023-09-24 DIAGNOSIS — I509 Heart failure, unspecified: Secondary | ICD-10-CM | POA: Diagnosis not present

## 2023-09-24 DIAGNOSIS — Z6841 Body Mass Index (BMI) 40.0 and over, adult: Secondary | ICD-10-CM

## 2023-09-24 DIAGNOSIS — G4733 Obstructive sleep apnea (adult) (pediatric): Secondary | ICD-10-CM

## 2023-09-24 DIAGNOSIS — M359 Systemic involvement of connective tissue, unspecified: Secondary | ICD-10-CM

## 2023-09-24 DIAGNOSIS — K760 Fatty (change of) liver, not elsewhere classified: Secondary | ICD-10-CM | POA: Diagnosis not present

## 2023-09-24 DIAGNOSIS — E669 Obesity, unspecified: Secondary | ICD-10-CM

## 2023-09-24 DIAGNOSIS — I10 Essential (primary) hypertension: Secondary | ICD-10-CM

## 2023-09-24 NOTE — Progress Notes (Signed)
 Office: [phone]  /  Fax: [phone]  Weight Summary And Biometrics  Vitals Temp: 97.6 F (36.4 C) BP: 126/80 Pulse Rate: 60 SpO2: 97 %   Anthropometric Measurements Height: 5\' 3"  (1.6 m) Weight: 240 lb (108.9 kg) BMI (Calculated): 42.52 Weight at Last Visit: 241 lb Weight Lost Since Last Visit: 1 lb Weight Gained Since Last Visit: 0 Starting Weight: 245 lb Total Weight Loss (lbs): 5 lb (2.268 kg) Peak Weight: 247 lb   No data recorded  No data recorded Today's Visit #: 4  Starting Date: 06/10/22   Subjective   Chief Complaint: Obesity  Interval History Discussed the use of AI scribe software for clinical note transcription with the patient, who gave verbal consent to proceed.  History of Present Illness   [name] is a 58 year old female who presents for medical weight management.  She experienced a recent illness starting on March 9th, during which both she and her husband became ill. She developed pneumonia in both lungs and was treated with antibiotics and steroids. Her husband had bronchitis, which exacerbated his asthma. She also had a urinary tract infection and has been on three different antibiotics, with the last course ending about two weeks ago. She has had a prolonged cough lasting weeks. No cold, flu, or COVID-19 symptoms.  She has a history of Roux-en-Y gastric bypass surgery due to severe obesity and back issues that required wheelchair use. Post-surgery, she lost significant weight, dropping from 300 pounds to 218 pounds, but has since regained some weight, currently weighing 240 pounds. She attributes some weight gain to menopause and medication use, specifically metoprolol  for atrial fibrillation, which she notes can cause weight gain. She has hypertension and is on metoprolol . She also has sleep apnea and uses a CPAP machine. Her fatty liver condition flares up intermittently. She is currently on Lasix  for fluid retention.  She  describes her metabolism as normal despite chronic calorie restriction post-gastric bypass. She notes that her weight gain is not due to overeating, as she often struggles to eat three meals a day. Physical activity is challenging due to joint pain, particularly in her knees and feet, which limits her ability to walk. She finds that when she is able to walk, she loses weight more easily. She has a history of back surgery following her gastric bypass due to a herniated disc and underwent a partial hysterectomy, which she believes contributed to her weight gain.  She is considering increasing her physical activity through water-based exercises, as she has access to a pool at home. She has a fear of water but is comfortable in the shallow end of the pool.  She has a history of an unspecified connective tissue disease present for about thirteen years. She is scheduled to see a new rheumatologist at [facility] in July for further evaluation. She notes that the disease has not been specified and may be affecting her heart, as she has developed heart issues over time.       Challenges affecting patient progress: orthopedic problems, medical conditions or chronic pain affecting mobility, medical comorbidities, and menopause.    Pharmacotherapy for weight management: She is currently taking no anti-obesity medication and not a good candidate due to chronic calorie restriction associated with Roux-en-Y and restrictive eating pattern. .   Assessment and Plan   Treatment Plan For Obesity:  Recommended Dietary Goals  [name] is currently in the action stage of change. As such, her goal is to continue weight  management plan. She has agreed to: follow a tailored, multi-day, low carbohydrate, high protein plan targeting 1200 calories and 90 grams of protein per day and follow the Category 2 plan - 1200 kcal per day  Behavioral Health and Counseling  We discussed the following behavioral modification strategies  today:  Continue to work on increasing calories consumed to about 1200 and increasing frequency of her meals. .  Additional education and resources provided today: Created new 7-day meal plan today  Recommended Physical Activity Goals  Montrese has been advised to work up to 150 minutes of moderate intensity aerobic activity a week and strengthening exercises 2-3 times per week for cardiovascular health, weight loss maintenance and preservation of muscle mass.   She has agreed to :  She will start doing aquatic exercises in her pool following YouTube videos  Pharmacotherapy  We discussed various medication options to help Wilmer with her weight loss efforts and we both agreed to :  Not a candidate for antiobesity medications  Associated Conditions Impacted by Obesity Treatment  Essential hypertension  Obstructive sleep apnea  History of Roux-en-Y gastric bypass  Metabolic dysfunction-associated steatotic liver disease (MASLD)     Assessment and Plan    Atrial Fibrillation Atrial fibrillation is managed with metoprolol . She has heart issues potentially related to an unspecified connective tissue disease. - Continue metoprolol .  Heart Failure Heart failure is managed with Lasix  for fluid retention. She has heart issues potentially related to an unspecified connective tissue disease. - Continue Lasix .  Hypertension Hypertension is managed with metoprolol , which is necessary for atrial fibrillation but may contribute to weight gain. - Continue metoprolol .  Obesity Obesity is managed with lifestyle modifications. She has Roux-en-Y gastric bypass and experiences diet-resistant obesity. Weight gain is influenced by menopause, sleep apnea, and medications. Physical activity is emphasized for weight management. Weight loss medications are not recommended due to her eating habits and the nature of her obesity. - Encourage physical activity, particularly aqua aerobics. - Provide a  daily meal plan with 1200 calories, high protein, and no beef or pork. - Advise regular meals to prevent metabolic slowdown.  Obstructive Sleep Apnea Obstructive sleep apnea is managed with CPAP. It contributes to weight gain, complicating obesity management. - Continue CPAP therapy.  Fatty Liver Fatty liver is a recurring condition that flares intermittently. Weight management is crucial for control. - Encourage weight loss through increased physical activity and dietary management.  Connective Tissue Disease (unspecified) Unspecified connective tissue disease with potential heart tissue involvement. She has an upcoming rheumatology appointment at [facility] for further evaluation. - Attend rheumatology appointment at [facility] in July.        Objective   Physical Exam:  Blood pressure 126/80, pulse 60, temperature 97.6 F (36.4 C), height 5\' 3"  (1.6 m), weight 240 lb (108.9 kg), last menstrual period 01/20/2011, SpO2 97%. Body mass index is 42.51 kg/m.  General: She is overweight, cooperative, alert, well developed, and in no acute distress. PSYCH: Has normal mood, affect and thought process.   HEENT: EOMI, sclerae are anicteric. Lungs: Normal breathing effort, no conversational dyspnea. Extremities: No edema.  Neurologic: No gross sensory or motor deficits. No tremors or fasciculations noted.    Diagnostic Data Reviewed:  BMET    Component Value Date/Time   NA 140 06/24/2023 1053   NA 138 04/27/2017 0933   K 4.3 06/24/2023 1053   K 3.3 (L) 04/27/2017 0933   CL 97 06/24/2023 1053   CO2 27 06/24/2023 1053   CO2  26 04/27/2017 0933   GLUCOSE 81 06/24/2023 1053   GLUCOSE 92 06/18/2023 0902   GLUCOSE 85 04/27/2017 0933   BUN 15 06/24/2023 1053   BUN 12.2 04/27/2017 0933   CREATININE 0.62 06/24/2023 1053   CREATININE 0.64 12/16/2022 0931   CREATININE 0.7 04/27/2017 0933   CALCIUM  9.6 06/24/2023 1053   CALCIUM  9.1 04/27/2017 0933   GFRNONAA >60 06/18/2023 0902   GFRNONAA >60  12/16/2022 0931   GFRAA >60 01/27/2020 0923   GFRAA >60 12/12/2019 1036   Lab Results  Component Value Date   HGBA1C 5.6 09/22/2023   HGBA1C 5.8 07/28/2014   Lab Results  Component Value Date   INSULIN  14.9 06/11/2023   Lab Results  Component Value Date   TSH 1.910 06/11/2023   CBC    Component Value Date/Time   WBC 8.8 06/18/2023 0902   WBC 6.8 09/09/2022 1006   RBC 4.45 06/18/2023 0903   RBC 4.55 06/18/2023 0902   HGB 13.3 06/18/2023 0902   HGB 13.6 06/11/2023 1304   HGB 13.8 04/27/2017 0933   HGB 13.5 04/04/2008 0902   HCT 40.2 06/18/2023 0902   HCT 43.2 06/11/2023 1304   HCT 41.3 04/27/2017 0933   HCT 39.7 04/04/2008 0902   PLT 418 (H) 06/18/2023 0902   PLT 509 (H) 06/11/2023 1304   MCV 88.4 06/18/2023 0902   MCV 90 06/11/2023 1304   MCV 90 04/27/2017 0933   MCV 89.9 04/04/2008 0902   MCH 29.2 06/18/2023 0902   MCHC 33.1 06/18/2023 0902   RDW 13.2 06/18/2023 0902   RDW 13.3 06/11/2023 1304   RDW 13.5 04/27/2017 0933   RDW 14.5 04/04/2008 0902   Iron  Studies    Component Value Date/Time   IRON  107 06/18/2023 0902   IRON  95 08/29/2019 0000   TIBC 402 06/18/2023 0902   TIBC 262 08/29/2019 0000   FERRITIN 16 06/18/2023 0903   FERRITIN 62 04/27/2017 0933   IRONPCTSAT 27 06/18/2023 0902   IRONPCTSAT 36 08/29/2019 0000   IRONPCTSAT 24 04/04/2008 0902   Lipid Panel     Component Value Date/Time   CHOL 203 (H) 06/11/2023 1304   TRIG 64 06/11/2023 1304   HDL 65 06/11/2023 1304   CHOLHDL 3 09/09/2022 1006   VLDL 16.8 09/09/2022 1006   LDLCALC 126 (H) 06/11/2023 1304   Hepatic Function Panel     Component Value Date/Time   PROT 7.1 06/24/2023 1053   PROT 7.5 04/27/2017 0933   ALBUMIN 4.6 06/24/2023 1053   ALBUMIN 4.0 04/27/2017 0933   AST 23 06/24/2023 1053   AST 23 12/16/2022 0931   AST 25 04/27/2017 0933   ALT 18 06/24/2023 1053   ALT 19 12/16/2022 0931   ALT 19 04/27/2017 0933   ALKPHOS 156 (H) 06/24/2023 1053   ALKPHOS 139 04/27/2017 0933    BILITOT 0.3 06/24/2023 1053   BILITOT 0.4 12/16/2022 0931   BILITOT 0.58 04/27/2017 0933   BILIDIR 0.1 09/19/2014 1040   IBILI 0.2 01/10/2011 1545      Component Value Date/Time   TSH 1.910 06/11/2023 1304   Nutritional Lab Results  Component Value Date   VD25OH 37.99 09/09/2022   VD25OH 26.93 (L) 08/07/2021   VD25OH 37.88 03/16/2017    Medications: Outpatient Encounter Medications as of 09/24/2023  Medication Sig Note   acetaminophen  (TYLENOL ) 325 MG tablet Take 1-2 tablets (325-650 mg total) by mouth every 4 (four) hours as needed for mild pain.    albuterol  (VENTOLIN  HFA) 108 (  90 Base) MCG/ACT inhaler Inhale 1-2 puffs into the lungs every 6 (six) hours as needed for wheezing or shortness of breath.    Biotin 1000 MCG tablet Take 1,000 mcg by mouth every other day.    celecoxib  (CELEBREX ) 200 MG capsule TAKE 1 CAPSULE BY MOUTH DAILY AS NEEDED FOR PAIN    cetirizine  (ZYRTEC  ALLERGY) 10 MG tablet Take 1 tablet (10 mg total) by mouth daily.    Cholecalciferol  (VITAMIN D3) 50 MCG (2000 UT) TABS Take 2,000 Units by mouth daily.    COLLAGEN PO Take by mouth. Liquid collagen with biotin    cyanocobalamin  (VITAMIN B12) 1000 MCG/ML injection Inject 1 ml into the muscle once a week for a month.  Then inject 1 ml into the muscle once a month thereafter.    famotidine (PEPCID) 40 MG tablet Take 40 mg by mouth 2 (two) times daily.    fluticasone  (FLONASE ) 50 MCG/ACT nasal spray Place 2 sprays into both nostrils daily.    Glucosamine HCl (GLUCOSAMINE PO) Take 1,000 mg by mouth daily.    hydrochlorothiazide  (HYDRODIURIL ) 25 MG tablet TAKE 1 TABLET (25 MG TOTAL) BY MOUTH DAILY.    Ibuprofen  200 MG CAPS Take 400 mg by mouth daily as needed (For pain).    levothyroxine  (SYNTHROID ) 88 MCG tablet TAKE 1 TABLET BY MOUTH EVERY DAY    loratadine  (CLARITIN ) 10 MG tablet TAKE 1 TABLET BY MOUTH EVERY DAY    Magnesium 250 MG TABS Take 420 mg by mouth daily. For leg cramps    Menthol , Topical Analgesic,  (BIOFREEZE EX) Apply 1 application  topically daily as needed (On Knees and feet).    metoprolol  succinate (TOPROL  XL) 25 MG 24 hr tablet Take 1 tablet (25 mg total) by mouth 2 (two) times daily.    Misc Natural Products (YUMVS BEET ROOT-TART CHERRY) 250-0.5 MG CHEW Chew 1 tablet by mouth 2 (two) times a week. 12/17/2021: No specific days    nitrofurantoin , macrocrystal-monohydrate, (MACROBID ) 100 MG capsule Take 1 capsule (100 mg total) by mouth 2 (two) times daily. 09/14/2023: Taken for pneumonia   rosuvastatin  (CRESTOR ) 5 MG tablet Take 1 tablet (5 mg total) by mouth at bedtime.    SYRINGE-NEEDLE, DISP, 3 ML (BD SAFETYGLIDE SYRINGE/NEEDLE) 25G X 1" 3 ML MISC Use for B12 injections    Turmeric 500 MG CAPS Take 500 mg by mouth as needed. 3 times weekly, 1 tab po    valsartan  (DIOVAN ) 160 MG tablet TAKE 1 TABLET BY MOUTH EVERY DAY    furosemide  (LASIX ) 40 MG tablet Take 1 tablet (40 mg total) by mouth daily.    potassium chloride  SA (KLOR-CON  M20) 20 MEQ tablet Take 1 tablet (20 mEq total) by mouth daily.    No facility-administered encounter medications on file as of 09/24/2023.     Follow-Up   Return in about 6 weeks (around 11/05/2023) for For Weight Mangement with Dr. Allie Area.Aaron Aas She was informed of the importance of frequent follow up visits to maximize her success with intensive lifestyle modifications for her multiple health conditions.  Attestation Statement   Reviewed by clinician on day of visit: allergies, medications, problem list, medical history, surgical history, family history, social history, and previous encounter notes.     Ladd Picker, MD

## 2023-09-29 ENCOUNTER — Encounter: Payer: Self-pay | Admitting: Cardiology

## 2023-09-29 ENCOUNTER — Ambulatory Visit: Payer: No Typology Code available for payment source | Attending: Cardiology | Admitting: Cardiology

## 2023-09-29 VITALS — BP 126/82 | HR 60 | Ht 63.0 in | Wt 240.0 lb

## 2023-09-29 DIAGNOSIS — I493 Ventricular premature depolarization: Secondary | ICD-10-CM

## 2023-09-29 DIAGNOSIS — G4733 Obstructive sleep apnea (adult) (pediatric): Secondary | ICD-10-CM

## 2023-09-29 DIAGNOSIS — I442 Atrioventricular block, complete: Secondary | ICD-10-CM

## 2023-09-29 DIAGNOSIS — I10 Essential (primary) hypertension: Secondary | ICD-10-CM | POA: Diagnosis not present

## 2023-09-29 DIAGNOSIS — R001 Bradycardia, unspecified: Secondary | ICD-10-CM

## 2023-09-29 DIAGNOSIS — Z79899 Other long term (current) drug therapy: Secondary | ICD-10-CM

## 2023-09-29 DIAGNOSIS — Z950 Presence of cardiac pacemaker: Secondary | ICD-10-CM

## 2023-09-29 NOTE — Patient Instructions (Signed)
 Medication Instructions:  Your physician recommends that you continue on your current medications as directed. Please refer to the Current Medication list given to you today.  *If you need a refill on your cardiac medications before your next appointment, please call your pharmacy*  Lab Work: CMET, Mag If you have labs (blood work) drawn today and your tests are completely normal, you will receive your results only by: MyChart Message (if you have MyChart) OR A paper copy in the mail If you have any lab test that is abnormal or we need to change your treatment, we will call you to review the results.    Follow-Up: At [facility], you and your health needs are our priority.  As part of our continuing mission to provide you with exceptional heart care, our providers are all part of one team.  This team includes your primary Cardiologist (physician) and Advanced Practice Providers or APPs (Physician Assistants and Nurse Practitioners) who all work together to provide you with the care you need, when you need it.  Your next appointment:   6 month(s)  Provider:   Kardie Tobb, DO

## 2023-09-29 NOTE — Progress Notes (Signed)
 Cardiology Office Note:    Date:  09/29/2023   ID:  [name], DOB 1966-02-22, MRN [id number]  PCP:  Zilphia Hilt, Charyl Coppersmith, MD  Cardiologist:  Jerryl Morin, DO  Electrophysiologist:  None   Referring MD: Zilphia Hilt, Estel*   " I am ok"  History of Present Illness:    [name] is a 58 y.o. female with a hx of mixed connective tissue disorder, intermittent AV block who underwent PPM insertion complicated by lead migration and asymptomatic perforation-she underwent insertion of a new lead, PAF which was recently found on her ppm interrogation, COPD, mild thrombocytosis, iron  deficiency anemia, secondary to absorption after bariatric surgery in 2012, concern for mixed connective tissue disorder, sleep apnea no longer on CPAP, hypertension, irritable bowel syndrome and obesity.    Since her last visit she has had pneumonia which was treated but took a long time to recover.  She also has been getting therapy which has helped with her fatigue and shortness of breath.  She is really happy with her health at this point.  She is still waiting for her appointment at [facility] in July for the rheumatologist.  I am very happy for Mrs. Head.   Past Medical History:  Diagnosis Date   A-fib [facility])    Allergic rhinitis, cause unspecified    Allergy 2016   Anemia, iron  deficiency    Arthritis    Back pain    CFS (chronic fatigue syndrome)    Chest pain    COVID-19 06/15/2020   Diastolic dysfunction    Diverticulosis of colon    Edema, lower extremity    Fatty liver    Gallbladder problem    GERD (gastroesophageal reflux disease)    Goiter    Hiatal hernia    Hypertension    Hypothyroidism    IBS (irritable bowel syndrome)    Joint pain    Morbid obesity (HCC)    Obstructive sleep apnea    Osteoarthritis    Pacemaker    Sleep apnea    SOB (shortness of breath)    Thrombocytosis    Undiagnosed cardiac murmurs    Vitamin B 12 deficiency    Vitamin D  deficiency      Past Surgical History:  Procedure Laterality Date   BARIATRIC SURGERY  06/17/2010   BILATERAL SALPINGECTOMY Bilateral 06/22/2014   Procedure: BILATERAL SALPINGECTOMY;  Surgeon: Marylu Soda, MD;  Location: [facility];  Service: Gynecology;  Laterality: Bilateral;   CYSTOSCOPY N/A 06/22/2014   Procedure: CYSTOSCOPY;  Surgeon: Marylu Soda, MD;  Location: [facility];  Service: Gynecology;  Laterality: N/A;   ENDOMETRIAL ABLATION  04/2007   FOOT SURGERY Left 11/24/2017   Dr Celia Coles   LAPAROSCOPIC ASSISTED VAGINAL HYSTERECTOMY N/A 06/22/2014   Procedure: LAPAROSCOPIC ASSISTED VAGINAL HYSTERECTOMY;  Surgeon: Marylu Soda, MD;  Location: [facility];  Service: Gynecology;  Laterality: N/A;   LEAD REVISION/REPAIR N/A 12/17/2021   Procedure: LEAD REVISION/REPAIR;  Surgeon: Tammie Fall, MD;  Location: [facility];  Service: Cardiovascular;  Laterality: N/A;   LIPOMA EXCISION     upper back   PACEMAKER IMPLANT N/A 12/09/2021   Procedure: PACEMAKER IMPLANT;  Surgeon: Tammie Fall, MD;  Location: [facility];  Service: Cardiovascular;  Laterality: N/A;   SPINE SURGERY  02/10/2012   L4-l5 fusion--cohen    Current Medications: Current Meds  Medication Sig   acetaminophen  (TYLENOL ) 325 MG tablet Take 1-2 tablets (325-650 mg total) by mouth every 4 (four)  hours as needed for mild pain.   albuterol  (VENTOLIN  HFA) 108 (90 Base) MCG/ACT inhaler Inhale 1-2 puffs into the lungs every 6 (six) hours as needed for wheezing or shortness of breath.   Biotin 1000 MCG tablet Take 1,000 mcg by mouth every other day.   celecoxib  (CELEBREX ) 200 MG capsule TAKE 1 CAPSULE BY MOUTH DAILY AS NEEDED FOR PAIN   cetirizine  (ZYRTEC  ALLERGY) 10 MG tablet Take 1 tablet (10 mg total) by mouth daily.   Cholecalciferol  (VITAMIN D3) 50 MCG (2000 UT) TABS Take 2,000 Units by mouth daily.   COLLAGEN PO Take by mouth. Liquid collagen with biotin   cyanocobalamin  (VITAMIN B12) 1000 MCG/ML injection Inject 1 ml into  the muscle once a week for a month.  Then inject 1 ml into the muscle once a month thereafter.   famotidine (PEPCID) 40 MG tablet Take 40 mg by mouth 2 (two) times daily.   fluticasone  (FLONASE ) 50 MCG/ACT nasal spray Place 2 sprays into both nostrils daily.   Glucosamine HCl (GLUCOSAMINE PO) Take 1,000 mg by mouth daily.   hydrochlorothiazide  (HYDRODIURIL ) 25 MG tablet TAKE 1 TABLET (25 MG TOTAL) BY MOUTH DAILY.   Ibuprofen  200 MG CAPS Take 400 mg by mouth daily as needed (For pain).   levothyroxine  (SYNTHROID ) 88 MCG tablet TAKE 1 TABLET BY MOUTH EVERY DAY   loratadine  (CLARITIN ) 10 MG tablet TAKE 1 TABLET BY MOUTH EVERY DAY   Magnesium 250 MG TABS Take 420 mg by mouth daily. For leg cramps   Menthol , Topical Analgesic, (BIOFREEZE EX) Apply 1 application  topically daily as needed (On Knees and feet).   metoprolol  succinate (TOPROL  XL) 25 MG 24 hr tablet Take 1 tablet (25 mg total) by mouth 2 (two) times daily.   Misc Natural Products (YUMVS BEET ROOT-TART CHERRY) 250-0.5 MG CHEW Chew 1 tablet by mouth 2 (two) times a week.   nitrofurantoin , macrocrystal-monohydrate, (MACROBID ) 100 MG capsule Take 1 capsule (100 mg total) by mouth 2 (two) times daily.   rosuvastatin  (CRESTOR ) 5 MG tablet Take 1 tablet (5 mg total) by mouth at bedtime.   SYRINGE-NEEDLE, DISP, 3 ML (BD SAFETYGLIDE SYRINGE/NEEDLE) 25G X 1" 3 ML MISC Use for B12 injections   Turmeric 500 MG CAPS Take 500 mg by mouth as needed. 3 times weekly, 1 tab po   valsartan  (DIOVAN ) 160 MG tablet TAKE 1 TABLET BY MOUTH EVERY DAY     Allergies:   Penicillins   Social History   Socioeconomic History   Marital status: Married    Spouse name: Theotis Flake   Number of children: 3   Years of education: Some college   Highest education level: Some college, no degree  Occupational History   Occupation: Herbalist: UNEMPLOYED   Occupation: stay at home mom  Tobacco Use   Smoking status: Never    Passive exposure: Never    Smokeless tobacco: Never   Tobacco comments:    Never Used Tobacco  Vaping Use   Vaping status: Never Used  Substance and Sexual Activity   Alcohol use: No   Drug use: No   Sexual activity: Yes    Partners: Male    Birth control/protection: Surgical    Comment: Hysterectomy  Other Topics Concern   Not on file  Social History Narrative   09/21/19   From: [place], [place] originally   Living: with husband, Theotis Flake and 2 daughters w/ family   Work: homemaker      Family:  3 children - [place], [place], and [facility]. Has 8 grandchildren and one on the way      Enjoys: spend time with family, shopping      Exercise: trying to walk more   Diet: eats late at night, will go most of the day w/o eating      Safety   Seat belts: Yes    Guns: Yes  and secure   Safe in relationships: Yes    Social Drivers of Health   Financial Resource Strain: Low Risk  (09/21/2023)   Overall Financial Resource Strain (CARDIA)    Difficulty of Paying Living Expenses: Not hard at all  Food Insecurity: No Food Insecurity (09/21/2023)   Hunger Vital Sign    Worried About Running Out of Food in the Last Year: Never true    Ran Out of Food in the Last Year: Never true  Transportation Needs: No Transportation Needs (09/21/2023)   PRAPARE - Administrator, Civil Service (Medical): No    Lack of Transportation (Non-Medical): No  Physical Activity: Unknown (09/21/2023)   Exercise Vital Sign    Days of Exercise per Week: Patient declined    Minutes of Exercise per Session: 10 min  Stress: No Stress Concern Present (09/21/2023)   [employer] of Occupational Health - Occupational Stress Questionnaire    Feeling of Stress : Not at all  Social Connections: Moderately Integrated (09/21/2023)   Social Connection and Isolation Panel [NHANES]    Frequency of Communication with Friends and Family: More than three times a week    Frequency of Social Gatherings with Friends and Family: Three times a week     Attends Religious Services: More than 4 times per year    Active Member of Clubs or Organizations: No    Attends Engineer, structural: Not on file    Marital Status: Married     Family History: The patient's family history includes Anxiety disorder in her mother; Arrhythmia in her mother; Arthritis in her mother; Breast cancer (age of onset: 65) in her maternal aunt; Breast cancer (age of onset: 93) in her mother; Cancer in her father; Colon polyps in her father; Diabetes in her mother; Healthy in her daughter, daughter, and daughter; Heart disease in her mother; Hyperlipidemia in her mother; Hypertension in her father and mother; Kidney cancer in her father; Lymphoma in her father; Obesity in her father; Ovarian cancer in her paternal grandmother; Stroke (age of onset: 52) in her maternal grandmother; Thyroid  disease in her mother. There is no history of Colon cancer.  ROS:   Review of Systems  Constitution: Negative for decreased appetite, fever and weight gain.  HENT: Negative for congestion, ear discharge, hoarse voice and sore throat.   Eyes: Negative for discharge, redness, vision loss in right eye and visual halos.  Cardiovascular: Reports palpitations. Negative for chest pain, dyspnea on exertion, leg swelling, orthopnea  Respiratory: Negative for cough, hemoptysis, shortness of breath and snoring.   Endocrine: Negative for heat intolerance and polyphagia.  Hematologic/Lymphatic: Negative for bleeding problem. Does not bruise/bleed easily.  Skin: Negative for flushing, nail changes, rash and suspicious lesions.  Musculoskeletal: Negative for arthritis, joint pain, muscle cramps, myalgias, neck pain and stiffness.  Gastrointestinal: Negative for abdominal pain, bowel incontinence, diarrhea and excessive appetite.  Genitourinary: Negative for decreased libido, genital sores and incomplete emptying.  Neurological: Negative for brief paralysis, focal weakness, headaches and loss  of balance.  Psychiatric/Behavioral: Negative for altered mental status, depression and suicidal  ideas.  Allergic/Immunologic: Negative for HIV exposure and persistent infections.    EKGs/Labs/Other Studies Reviewed:    The following studies were reviewed today:   EKG:  The ekg ordered today demonstrates paced rhythm., Hr   bpm Recent Labs: 06/11/2023: TSH 1.910 06/18/2023: Hemoglobin 13.3; Platelet Count 418 06/24/2023: ALT 18; BUN 15; Creatinine, Ser 0.62; Magnesium 2.3; Potassium 4.3; Sodium 140  Recent Lipid Panel    Component Value Date/Time   CHOL 203 (H) 06/11/2023 1304   TRIG 64 06/11/2023 1304   HDL 65 06/11/2023 1304   CHOLHDL 3 09/09/2022 1006   VLDL 16.8 09/09/2022 1006   LDLCALC 126 (H) 06/11/2023 1304    Physical Exam:    VS:  BP 126/82 (BP Location: Left Arm, Patient Position: Sitting)   Pulse 60   Ht 5\' 3"  (1.6 m)   Wt 240 lb (108.9 kg)   LMP 01/20/2011   SpO2 96%   BMI 42.51 kg/m     Wt Readings from Last 3 Encounters:  09/29/23 240 lb (108.9 kg)  09/24/23 240 lb (108.9 kg)  09/22/23 243 lb 8 oz (110.5 kg)     GEN: Well nourished, well developed in no acute distress HEENT: Normal NECK: No JVD; No carotid bruits LYMPHATICS: No lymphadenopathy CARDIAC: S1S2 noted,RRR, no murmurs, rubs, gallops RESPIRATORY:  Clear to auscultation without rales, wheezing or rhonchi  ABDOMEN: Soft, non-tender, non-distended, +bowel sounds, no guarding. EXTREMITIES: No edema, No cyanosis, no clubbing MUSCULOSKELETAL:  No deformity  SKIN: Warm and dry NEUROLOGIC:  Alert and oriented x 3, non-focal PSYCHIATRIC:  Normal affect, good insight  ASSESSMENT:    1. Essential hypertension   2. Bradycardia   3. PVC (premature ventricular contraction)   4. Medication management   5. OSA (obstructive sleep apnea)   6. Heart block AV complete (HCC)   7. Pacemaker    PLAN:    Premature Ventricular Contractions (PVCs) Heart block s/p pacemaker implantation - 09/14/2023  interrogation reviewed.  PAF - on rate control and Eliquis for stroke prevention.  Hx of OSA - no need for CPAP after bariatric sugery.  Hypertension  She is stable from a cardiovascular standpoint.   No medication changes will be made today.  Review of her device interrogation recently from April 2025 device programming is appropriate.  Will get blood work today.    The patient is in agreement with the above plan. The patient left the office in stable condition.  The patient will follow up in 6 months or sooner if needed.   Medication Adjustments/Labs and Tests Ordered: Current medicines are reviewed at length with the patient today.  Concerns regarding medicines are outlined above.  Orders Placed This Encounter  Procedures   Comp Met (CMET)   Magnesium   EKG 12-Lead   No orders of the defined types were placed in this encounter.   Patient Instructions  Medication Instructions:  Your physician recommends that you continue on your current medications as directed. Please refer to the Current Medication list given to you today.  *If you need a refill on your cardiac medications before your next appointment, please call your pharmacy*  Lab Work: CMET, Mag If you have labs (blood work) drawn today and your tests are completely normal, you will receive your results only by: MyChart Message (if you have MyChart) OR A paper copy in the mail If you have any lab test that is abnormal or we need to change your treatment, we will call you to review the results.  Follow-Up: At [facility], you and your health needs are our priority.  As part of our continuing mission to provide you with exceptional heart care, our providers are all part of one team.  This team includes your primary Cardiologist (physician) and Advanced Practice Providers or APPs (Physician Assistants and Nurse Practitioners) who all work together to provide you with the care you need, when you need it.  Your  next appointment:   6 month(s)  Provider:   Huie Ghuman, DO      Adopting a Healthy Lifestyle.  Know what a healthy weight is for you (roughly BMI <25) and aim to maintain this   Aim for 7+ servings of fruits and vegetables daily   65-80+ fluid ounces of water or unsweet tea for healthy kidneys   Limit to max 1 drink of alcohol per day; avoid smoking/tobacco   Limit animal fats in diet for cholesterol and heart health - choose grass fed whenever available   Avoid highly processed foods, and foods high in saturated/trans fats   Aim for low stress - take time to unwind and care for your mental health   Aim for 150 min of moderate intensity exercise weekly for heart health, and weights twice weekly for bone health   Aim for 7-9 hours of sleep daily   When it comes to diets, agreement about the perfect plan isnt easy to find, even among the experts. Experts at the [facility] of [employer] developed an idea known as the Healthy Eating Plate. Just imagine a plate divided into logical, healthy portions.   The emphasis is on diet quality:   Load up on vegetables and fruits - one-half of your plate: Aim for color and variety, and remember that potatoes dont count.   Go for whole grains - one-quarter of your plate: Whole wheat, barley, wheat berries, quinoa, oats, brown rice, and foods made with them. If you want pasta, go with whole wheat pasta.   Protein power - one-quarter of your plate: Fish, chicken, beans, and nuts are all healthy, versatile protein sources. Limit red meat.   The diet, however, does go beyond the plate, offering a few other suggestions.   Use healthy plant oils, such as olive, canola, soy, corn, sunflower and peanut. Check the labels, and avoid partially hydrogenated oil, which have unhealthy trans fats.   If youre thirsty, drink water. Coffee and tea are good in moderation, but skip sugary drinks and limit milk and dairy products to one or two daily  servings.   The type of carbohydrate in the diet is more important than the amount. Some sources of carbohydrates, such as vegetables, fruits, whole grains, and beans-are healthier than others.   Finally, stay active  Signed, Sesilia Poucher, DO  09/29/2023 10:42 AM    [facility]

## 2023-09-30 LAB — COMPREHENSIVE METABOLIC PANEL WITH GFR
ALT: 40 IU/L — ABNORMAL HIGH (ref 0–32)
AST: 34 IU/L (ref 0–40)
Albumin: 4.7 g/dL (ref 3.8–4.9)
Alkaline Phosphatase: 143 IU/L — ABNORMAL HIGH (ref 44–121)
BUN/Creatinine Ratio: 25 — ABNORMAL HIGH (ref 9–23)
BUN: 17 mg/dL (ref 6–24)
Bilirubin Total: 0.4 mg/dL (ref 0.0–1.2)
CO2: 26 mmol/L (ref 20–29)
Calcium: 9.2 mg/dL (ref 8.7–10.2)
Chloride: 97 mmol/L (ref 96–106)
Creatinine, Ser: 0.69 mg/dL (ref 0.57–1.00)
Globulin, Total: 2.5 g/dL (ref 1.5–4.5)
Glucose: 82 mg/dL (ref 70–99)
Potassium: 4.4 mmol/L (ref 3.5–5.2)
Sodium: 139 mmol/L (ref 134–144)
Total Protein: 7.2 g/dL (ref 6.0–8.5)
eGFR: 101 mL/min/{1.73_m2} (ref >59)

## 2023-09-30 LAB — MAGNESIUM: Magnesium: 2.2 mg/dL (ref 1.6–2.3)

## 2023-10-06 ENCOUNTER — Other Ambulatory Visit: Payer: Self-pay

## 2023-10-06 ENCOUNTER — Ambulatory Visit: Payer: Self-pay | Admitting: Cardiology

## 2023-10-06 ENCOUNTER — Ambulatory Visit: Payer: 59 | Admitting: Rheumatology

## 2023-10-06 DIAGNOSIS — Z79899 Other long term (current) drug therapy: Secondary | ICD-10-CM

## 2023-10-06 NOTE — Progress Notes (Signed)
Orders placed for repeat bloodwork

## 2023-10-21 ENCOUNTER — Other Ambulatory Visit: Payer: Self-pay | Admitting: *Deleted

## 2023-10-21 DIAGNOSIS — Z79899 Other long term (current) drug therapy: Secondary | ICD-10-CM

## 2023-10-21 NOTE — Progress Notes (Signed)
 Remote pacemaker transmission.

## 2023-10-21 NOTE — Addendum Note (Signed)
 Addended by: Lott Rouleau A on: 10/21/2023 02:19 PM   Modules accepted: Orders

## 2023-10-22 LAB — COMPREHENSIVE METABOLIC PANEL WITH GFR
ALT: 25 IU/L (ref 0–32)
AST: 26 IU/L (ref 0–40)
Albumin: 4.5 g/dL (ref 3.8–4.9)
Alkaline Phosphatase: 138 IU/L — ABNORMAL HIGH (ref 44–121)
BUN/Creatinine Ratio: 19 (ref 9–23)
BUN: 14 mg/dL (ref 6–24)
Bilirubin Total: 0.3 mg/dL (ref 0.0–1.2)
CO2: 24 mmol/L (ref 20–29)
Calcium: 9.3 mg/dL (ref 8.7–10.2)
Chloride: 97 mmol/L (ref 96–106)
Creatinine, Ser: 0.74 mg/dL (ref 0.57–1.00)
Globulin, Total: 2.5 g/dL (ref 1.5–4.5)
Glucose: 102 mg/dL — ABNORMAL HIGH (ref 70–99)
Potassium: 3.6 mmol/L (ref 3.5–5.2)
Sodium: 139 mmol/L (ref 134–144)
Total Protein: 7 g/dL (ref 6.0–8.5)
eGFR: 94 mL/min/{1.73_m2} (ref >59)

## 2023-10-22 LAB — MAGNESIUM: Magnesium: 2 mg/dL (ref 1.6–2.3)

## 2023-10-26 ENCOUNTER — Telehealth: Payer: Self-pay | Admitting: Neurology

## 2023-10-26 NOTE — Telephone Encounter (Signed)
 Pt said have two appointments schedule back to back would prefer to keep the 12/21/23 appointment and cancel 11/05/23

## 2023-10-27 ENCOUNTER — Other Ambulatory Visit: Payer: Self-pay | Admitting: Internal Medicine

## 2023-10-27 DIAGNOSIS — M2550 Pain in unspecified joint: Secondary | ICD-10-CM

## 2023-10-29 ENCOUNTER — Telehealth: Payer: Self-pay | Admitting: Neurology

## 2023-10-29 NOTE — Telephone Encounter (Signed)
 Request to cx appointment, not needed

## 2023-11-05 ENCOUNTER — Ambulatory Visit: Payer: 59 | Admitting: Neurology

## 2023-11-06 ENCOUNTER — Ambulatory Visit: Payer: Self-pay | Admitting: Cardiology

## 2023-11-10 ENCOUNTER — Other Ambulatory Visit: Payer: Self-pay | Admitting: Internal Medicine

## 2023-11-10 DIAGNOSIS — I10 Essential (primary) hypertension: Secondary | ICD-10-CM

## 2023-11-10 DIAGNOSIS — E039 Hypothyroidism, unspecified: Secondary | ICD-10-CM

## 2023-11-18 ENCOUNTER — Ambulatory Visit: Attending: Cardiology | Admitting: Cardiology

## 2023-11-18 ENCOUNTER — Encounter: Payer: Self-pay | Admitting: Cardiology

## 2023-11-18 VITALS — BP 124/62 | HR 60 | Ht 63.0 in | Wt 240.2 lb

## 2023-11-18 DIAGNOSIS — G4733 Obstructive sleep apnea (adult) (pediatric): Secondary | ICD-10-CM

## 2023-11-18 DIAGNOSIS — I10 Essential (primary) hypertension: Secondary | ICD-10-CM

## 2023-11-18 NOTE — Progress Notes (Signed)
 SLEEP MEDICINE Visit     Date:  11/18/2023   ID:  Rula, [place] 1965-11-20, MRN [id number] The patient was identified using 2 identifiers.  PCP:  Theophilus Andrews, Tully GRADE, MD   [facility] Providers Cardiologist:  Dub Huntsman, DO     Chief Complaint:  OSA  History of Present Illness:    [name] is a 58 y.o. female with a history of mixed connective tissue disorder, intermittent AV block status post PPM, PAF and OSA.  Apparently she was on CPAP at some time years ago and then had weight loss surgery and did not need the CPAP after that in 2012 and stopped using it.  Dr. Huntsman reordered a home sleep study to see if she had any residual sleep apnea.   She was having problems with feeling sleepy and tired during the day and her husband said that she was snoring very badly.  She underwent HST.  This revealed mild obstructive sleep apnea with an AHI of 13/h.  She was started on auto CPAP from 4 to 15 cm H2O.    She is doing well with her PAP device and thinks that she has gotten used to it.  She tolerates the nasal mask and feels the pressure is adequate.  Since going on PAP she feels rested in the am and has no significant daytime sleepiness but sometimes will take a nap.  She denies any significant mouth or nasal dryness or nasal congestion.  She does not think that he snores.     Past Medical History:  Diagnosis Date   A-fib [facility])    Allergic rhinitis, cause unspecified    Allergy 2016   Anemia, iron  deficiency    Arthritis    Back pain    CFS (chronic fatigue syndrome)    Chest pain    COVID-19 06/15/2020   Diastolic dysfunction    Diverticulosis of colon    Edema, lower extremity    Fatty liver    Gallbladder problem    GERD (gastroesophageal reflux disease)    Goiter    Hiatal hernia    Hypertension    Hypothyroidism    IBS (irritable bowel syndrome)    Joint pain    Morbid obesity (HCC)    Obstructive sleep apnea    OSA on CPAP     mild obstructive sleep apnea with an AHI of 13/h.  On auto CPAP from 4 to 15cm H2O   Osteoarthritis    Pacemaker    SOB (shortness of breath)    Thrombocytosis    Undiagnosed cardiac murmurs    Vitamin B 12 deficiency    Vitamin D  deficiency    Past Surgical History:  Procedure Laterality Date   BARIATRIC SURGERY  06/17/2010   BILATERAL SALPINGECTOMY Bilateral 06/22/2014   Procedure: BILATERAL SALPINGECTOMY;  Surgeon: Ovid All, MD;  Location: [facility];  Service: Gynecology;  Laterality: Bilateral;   CYSTOSCOPY N/A 06/22/2014   Procedure: CYSTOSCOPY;  Surgeon: Ovid All, MD;  Location: [facility];  Service: Gynecology;  Laterality: N/A;   ENDOMETRIAL ABLATION  04/2007   FOOT SURGERY Left 11/24/2017   Dr Magdalen   LAPAROSCOPIC ASSISTED VAGINAL HYSTERECTOMY N/A 06/22/2014   Procedure: LAPAROSCOPIC ASSISTED VAGINAL HYSTERECTOMY;  Surgeon: Ovid All, MD;  Location: [facility];  Service: Gynecology;  Laterality: N/A;   LEAD REVISION/REPAIR N/A 12/17/2021   Procedure: LEAD REVISION/REPAIR;  Surgeon: Waddell Danelle ORN, MD;  Location: [facility];  Service: Cardiovascular;  Laterality: N/A;   LIPOMA EXCISION     upper back   PACEMAKER IMPLANT N/A 12/09/2021   Procedure: PACEMAKER IMPLANT;  Surgeon: Waddell Danelle ORN, MD;  Location: [facility];  Service: Cardiovascular;  Laterality: N/A;   SPINE SURGERY  02/10/2012   L4-l5 fusion--cohen     Current Meds  Medication Sig   acetaminophen  (TYLENOL ) 325 MG tablet Take 1-2 tablets (325-650 mg total) by mouth every 4 (four) hours as needed for mild pain.   albuterol  (VENTOLIN  HFA) 108 (90 Base) MCG/ACT inhaler Inhale 1-2 puffs into the lungs every 6 (six) hours as needed for wheezing or shortness of breath.   Biotin 1000 MCG tablet Take 1,000 mcg by mouth every other day.   celecoxib  (CELEBREX ) 200 MG capsule TAKE 1 CAPSULE BY MOUTH DAILY AS NEEDED FOR PAIN   cetirizine  (ZYRTEC  ALLERGY) 10 MG tablet Take 1 tablet (10 mg total) by  mouth daily.   Cholecalciferol  (VITAMIN D3) 50 MCG (2000 UT) TABS Take 2,000 Units by mouth daily.   COLLAGEN PO Take by mouth. Liquid collagen with biotin   cyanocobalamin  (VITAMIN B12) 1000 MCG/ML injection Inject 1 ml into the muscle once a week for a month.  Then inject 1 ml into the muscle once a month thereafter.   famotidine (PEPCID) 40 MG tablet Take 40 mg by mouth 2 (two) times daily.   fluticasone  (FLONASE ) 50 MCG/ACT nasal spray Place 2 sprays into both nostrils daily.   furosemide  (LASIX ) 40 MG tablet Take 1 tablet (40 mg total) by mouth daily.   Glucosamine HCl (GLUCOSAMINE PO) Take 1,000 mg by mouth daily.   hydrochlorothiazide  (HYDRODIURIL ) 25 MG tablet TAKE 1 TABLET (25 MG TOTAL) BY MOUTH DAILY.   Ibuprofen  200 MG CAPS Take 400 mg by mouth daily as needed (For pain).   levothyroxine  (SYNTHROID ) 88 MCG tablet TAKE 1 TABLET BY MOUTH EVERY DAY   loratadine  (CLARITIN ) 10 MG tablet TAKE 1 TABLET BY MOUTH EVERY DAY   Magnesium 250 MG TABS Take 420 mg by mouth daily. For leg cramps   Menthol , Topical Analgesic, (BIOFREEZE EX) Apply 1 application  topically daily as needed (On Knees and feet).   metoprolol  succinate (TOPROL  XL) 25 MG 24 hr tablet Take 1 tablet (25 mg total) by mouth 2 (two) times daily.   Misc Natural Products (YUMVS BEET ROOT-TART CHERRY) 250-0.5 MG CHEW Chew 1 tablet by mouth 2 (two) times a week.   nitrofurantoin , macrocrystal-monohydrate, (MACROBID ) 100 MG capsule Take 1 capsule (100 mg total) by mouth 2 (two) times daily.   potassium chloride  SA (KLOR-CON  M20) 20 MEQ tablet Take 1 tablet (20 mEq total) by mouth daily.   rosuvastatin  (CRESTOR ) 5 MG tablet Take 1 tablet (5 mg total) by mouth at bedtime.   SYRINGE-NEEDLE, DISP, 3 ML (BD SAFETYGLIDE SYRINGE/NEEDLE) 25G X 1 3 ML MISC Use for B12 injections   Turmeric 500 MG CAPS Take 500 mg by mouth as needed. 3 times weekly, 1 tab po   valsartan  (DIOVAN ) 160 MG tablet TAKE 1 TABLET BY MOUTH EVERY DAY     Allergies:    Penicillins   Social History   Tobacco Use   Smoking status: Never    Passive exposure: Never   Smokeless tobacco: Never   Tobacco comments:    Never Used Tobacco  Vaping Use   Vaping status: Never Used  Substance Use Topics   Alcohol use: No   Drug use: No     Family  Hx: The patient's family history includes Anxiety disorder in her mother; Arrhythmia in her mother; Arthritis in her mother; Breast cancer (age of onset: 28) in her maternal aunt; Breast cancer (age of onset: 11) in her mother; Cancer in her father; Colon polyps in her father; Diabetes in her mother; Healthy in her daughter, daughter, and daughter; Heart disease in her mother; Hyperlipidemia in her mother; Hypertension in her father and mother; Kidney cancer in her father; Lymphoma in her father; Obesity in her father; Ovarian cancer in her paternal grandmother; Stroke (age of onset: 75) in her maternal grandmother; Thyroid  disease in her mother. There is no history of Colon cancer.  ROS:   Please see the history of present illness.     All other systems reviewed and are negative.   Prior Sleep studies:   The following studies were reviewed today:  Home sleep study and PAP compliance download  Labs/Other Tests and Data Reviewed:     Recent Labs: 06/11/2023: TSH 1.910 06/18/2023: Hemoglobin 13.3; Platelet Count 418 10/21/2023: ALT 25; BUN 14; Creatinine, Ser 0.74; Magnesium 2.0; Potassium 3.6; Sodium 139    Wt Readings from Last 3 Encounters:  11/18/23 240 lb 3.2 oz (109 kg)  09/29/23 240 lb (108.9 kg)  09/24/23 240 lb (108.9 kg)        Objective:    Vital Signs:  BP 124/62   Pulse 60   Ht 5' 3 (1.6 m)   Wt 240 lb 3.2 oz (109 kg)   LMP 01/20/2011   SpO2 99%   BMI 42.55 kg/m   GEN: Well nourished, well developed in no acute distress HEENT: Normal NECK: No JVD; No carotid bruits LYMPHATICS: No lymphadenopathy CARDIAC:RRR, no murmurs, rubs, gallops RESPIRATORY:  Clear to auscultation without  rales, wheezing or rhonchi  ABDOMEN: Soft, non-tender, non-distended MUSCULOSKELETAL:  No edema; No deformity  SKIN: Warm and dry NEUROLOGIC:  Alert and oriented x 3 PSYCHIATRIC:  Normal affect  ASSESSMENT & PLAN:    OSA - The patient is tolerating PAP therapy well without any problems. The PAP download performed by his DME was personally reviewed and interpreted by me today and showed an AHI of 0.1 /hr on auto CPAP from 4-15 cm H2O with 97% compliance in using more than 4 hours nightly.  The patient has been using and benefiting from PAP use and will continue to benefit from therapy.   Hypertension - BP controlled on exam today - Continue HCTZ 25 mg daily, Toprol -XL 25 mg twice daily and valsartan  160 mg daily with as needed refills - I have personally reviewed and interpreted outside labs performed by patient's PCP which showed SCr 0.74 and K+ 3.6 on 10/21/23  Medication Adjustments/Labs and Tests Ordered: Current medicines are reviewed at length with the patient today.  Concerns regarding medicines are outlined above.   Tests Ordered: No orders of the defined types were placed in this encounter.   Medication Changes: No orders of the defined types were placed in this encounter.   Follow Up:  In Person in 1 year(s)  Signed, Wilbert Bihari, MD  11/18/2023 9:50 AM    [facility]

## 2023-11-18 NOTE — Patient Instructions (Signed)

## 2023-11-24 ENCOUNTER — Ambulatory Visit (INDEPENDENT_AMBULATORY_CARE_PROVIDER_SITE_OTHER): Admitting: Internal Medicine

## 2023-12-09 ENCOUNTER — Ambulatory Visit: Payer: 59

## 2023-12-09 DIAGNOSIS — I442 Atrioventricular block, complete: Secondary | ICD-10-CM | POA: Diagnosis not present

## 2023-12-09 LAB — CUP PACEART REMOTE DEVICE CHECK
Battery Remaining Longevity: 156 mo
Battery Remaining Percentage: 100 %
Brady Statistic RA Percent Paced: 48 %
Brady Statistic RV Percent Paced: 0 %
Date Time Interrogation Session: 20250716043100
Implantable Lead Connection Status: 753985
Implantable Lead Connection Status: 753985
Implantable Lead Implant Date: 20230717
Implantable Lead Implant Date: 20230717
Implantable Lead Location: 753859
Implantable Lead Location: 753860
Implantable Lead Model: 7841
Implantable Lead Model: 7842
Implantable Lead Serial Number: 1204466
Implantable Lead Serial Number: 1298900
Implantable Pulse Generator Implant Date: 20230717
Lead Channel Impedance Value: 530 Ohm
Lead Channel Impedance Value: 718 Ohm
Lead Channel Pacing Threshold Amplitude: 0.6 V
Lead Channel Pacing Threshold Pulse Width: 0.4 ms
Lead Channel Setting Pacing Amplitude: 2.5 V
Lead Channel Setting Pacing Amplitude: 3.3 V
Lead Channel Setting Pacing Pulse Width: 1 ms
Lead Channel Setting Sensing Sensitivity: 1 mV
Pulse Gen Serial Number: 603605
Zone Setting Status: 755011

## 2023-12-14 ENCOUNTER — Encounter: Payer: Self-pay | Admitting: Medical Oncology

## 2023-12-14 ENCOUNTER — Ambulatory Visit: Payer: Self-pay | Admitting: Internal Medicine

## 2023-12-14 ENCOUNTER — Inpatient Hospital Stay: Payer: No Typology Code available for payment source | Attending: Hematology & Oncology

## 2023-12-14 ENCOUNTER — Inpatient Hospital Stay (HOSPITAL_BASED_OUTPATIENT_CLINIC_OR_DEPARTMENT_OTHER): Payer: Self-pay | Admitting: Medical Oncology

## 2023-12-14 VITALS — BP 131/63 | HR 56 | Temp 97.5°F | Resp 18 | Ht 63.0 in | Wt 239.0 lb

## 2023-12-14 DIAGNOSIS — D75839 Thrombocytosis, unspecified: Secondary | ICD-10-CM | POA: Diagnosis not present

## 2023-12-14 DIAGNOSIS — D508 Other iron deficiency anemias: Secondary | ICD-10-CM | POA: Insufficient documentation

## 2023-12-14 DIAGNOSIS — G8929 Other chronic pain: Secondary | ICD-10-CM

## 2023-12-14 DIAGNOSIS — R899 Unspecified abnormal finding in specimens from other organs, systems and tissues: Secondary | ICD-10-CM | POA: Diagnosis not present

## 2023-12-14 DIAGNOSIS — K909 Intestinal malabsorption, unspecified: Secondary | ICD-10-CM | POA: Insufficient documentation

## 2023-12-14 DIAGNOSIS — R936 Abnormal findings on diagnostic imaging of limbs: Secondary | ICD-10-CM | POA: Diagnosis not present

## 2023-12-14 LAB — CMP (CANCER CENTER ONLY)
ALT: 17 U/L (ref 0–44)
AST: 21 U/L (ref 15–41)
Albumin: 4.5 g/dL (ref 3.5–5.0)
Alkaline Phosphatase: 103 U/L (ref 38–126)
Anion gap: 11 (ref 5–15)
BUN: 15 mg/dL (ref 6–20)
CO2: 29 mmol/L (ref 22–32)
Calcium: 9.4 mg/dL (ref 8.9–10.3)
Chloride: 100 mmol/L (ref 98–111)
Creatinine: 0.71 mg/dL (ref 0.44–1.00)
GFR, Estimated: >60 mL/min (ref >60)
Glucose, Bld: 139 mg/dL — ABNORMAL HIGH (ref 70–99)
Potassium: 3.3 mmol/L — ABNORMAL LOW (ref 3.5–5.1)
Sodium: 140 mmol/L (ref 135–145)
Total Bilirubin: 0.4 mg/dL (ref 0.0–1.2)
Total Protein: 7 g/dL (ref 6.5–8.1)

## 2023-12-14 LAB — CBC WITH DIFFERENTIAL (CANCER CENTER ONLY)
Abs Immature Granulocytes: 0.07 K/uL (ref 0.00–0.07)
Basophils Absolute: 0.1 K/uL (ref 0.0–0.1)
Basophils Relative: 1 %
Eosinophils Absolute: 0.1 K/uL (ref 0.0–0.5)
Eosinophils Relative: 1 %
HCT: 41 % (ref 36.0–46.0)
Hemoglobin: 13.5 g/dL (ref 12.0–15.0)
Immature Granulocytes: 1 %
Lymphocytes Relative: 46 %
Lymphs Abs: 3.9 K/uL (ref 0.7–4.0)
MCH: 30 pg (ref 26.0–34.0)
MCHC: 32.9 g/dL (ref 30.0–36.0)
MCV: 91.1 fL (ref 80.0–100.0)
Monocytes Absolute: 0.4 K/uL (ref 0.1–1.0)
Monocytes Relative: 4 %
Neutro Abs: 4 K/uL (ref 1.7–7.7)
Neutrophils Relative %: 47 %
Platelet Count: 430 K/uL — ABNORMAL HIGH (ref 150–400)
RBC: 4.5 MIL/uL (ref 3.87–5.11)
RDW: 13.6 % (ref 11.5–15.5)
WBC Count: 8.5 K/uL (ref 4.0–10.5)
nRBC: 0 % (ref 0.0–0.2)

## 2023-12-14 LAB — LACTATE DEHYDROGENASE: LDH: 199 U/L — ABNORMAL HIGH (ref 98–192)

## 2023-12-14 LAB — RETICULOCYTES
Immature Retic Fract: 7 % (ref 2.3–15.9)
RBC.: 4.53 MIL/uL (ref 3.87–5.11)
Retic Count, Absolute: 95.1 K/uL (ref 19.0–186.0)
Retic Ct Pct: 2.1 % (ref 0.4–3.1)

## 2023-12-14 LAB — IRON AND IRON BINDING CAPACITY (CC-WL,HP ONLY)
Iron: 98 ug/dL (ref 28–170)
Saturation Ratios: 33 % — ABNORMAL HIGH (ref 10.4–31.8)
TIBC: 295 ug/dL (ref 250–450)
UIBC: 197 ug/dL (ref 148–442)

## 2023-12-14 LAB — FERRITIN: Ferritin: 305 ng/mL (ref 11–307)

## 2023-12-14 NOTE — Progress Notes (Signed)
 Hematology and Oncology Follow Up Visit  [name] [id number] August 05, 1965 58 y.o. 12/14/2023   Principle Diagnosis:  Thrombocytosis  - JAK2 (-)/ DNMT3A (+) Family history of lymphoma (father) Iron  deficiency anemia secondary to malabsorption with bariatric surgery (2012)    Current Therapy:        Observation IV iron  as indicated   Feraheme  given on 04/27/2018 Venofer  200 mg- 09/14/2023   Interim History:  [name] is here today for follow-up.   She states that she has been doing fair. She recently saw rheumatology at [facility] who did some imaging and testing. She had an x ray of her right ankle which showed a small nonspecific sclerotic lesion in the distal tibia. She also reports continued neuro symptoms. Her last MR brain did suggest potential IIH. I have asked that she continue follow up with her neuro team.   There has been no bleeding to her knowledge: denies epistaxis, gingivitis, hemoptysis, hematemesis, hematuria, melena, excessive bruising, blood donation.   She has had no change in bowel or bladder habits.  There is been no rashes.  She has had no bleeding.    There is been no leg swelling.  Overall, I would say performance status about ECOG 1.   Wt Readings from Last 3 Encounters:  12/14/23 239 lb (108.4 kg)  11/18/23 240 lb 3.2 oz (109 kg)  09/29/23 240 lb (108.9 kg)     Medications:  Allergies as of 12/14/2023       Reactions   Penicillins Other (See Comments)   Causes severe, difficult to treat yeast infections        Medication List        Accurate as of December 14, 2023 11:39 AM. If you have any questions, ask your nurse or doctor.          STOP taking these medications    albuterol  108 (90 Base) MCG/ACT inhaler Commonly known as: VENTOLIN  HFA Stopped by: Lauraine CHRISTELLA Dais   nitrofurantoin  (macrocrystal-monohydrate) 100 MG capsule Commonly known as: Macrobid  Stopped by: Lauraine CHRISTELLA Dais   YumVs Beet Root-Tart Cherry 250-0.5  MG Chew Stopped by: Lauraine CHRISTELLA Dais       TAKE these medications    acetaminophen  325 MG tablet Commonly known as: TYLENOL  Take 1-2 tablets (325-650 mg total) by mouth every 4 (four) hours as needed for mild pain.   BD SafetyGlide Syringe/Needle 25G X 1 3 ML Misc Generic drug: SYRINGE-NEEDLE (DISP) 3 ML Use for B12 injections   BIOFREEZE EX Apply 1 application  topically daily as needed (On Knees and feet).   Biotin 1000 MCG tablet Take 1,000 mcg by mouth every other day.   celecoxib  200 MG capsule Commonly known as: CELEBREX  TAKE 1 CAPSULE BY MOUTH DAILY AS NEEDED FOR PAIN   cetirizine  10 MG tablet Commonly known as: ZyrTEC  Allergy Take 1 tablet (10 mg total) by mouth daily.   COLLAGEN PO Take by mouth. Liquid collagen with biotin   cyanocobalamin  1000 MCG/ML injection Commonly known as: VITAMIN B12 Inject 1 ml into the muscle once a week for a month.  Then inject 1 ml into the muscle once a month thereafter.   famotidine 40 MG tablet Commonly known as: PEPCID Take 40 mg by mouth 2 (two) times daily.   fluticasone  50 MCG/ACT nasal spray Commonly known as: FLONASE  Place 2 sprays into both nostrils daily.   furosemide  40 MG tablet Commonly known as: LASIX  Take 1 tablet (40 mg total) by mouth daily.  GLUCOSAMINE PO Take 1,000 mg by mouth daily.   hydrochlorothiazide  25 MG tablet Commonly known as: HYDRODIURIL  TAKE 1 TABLET (25 MG TOTAL) BY MOUTH DAILY.   Ibuprofen  200 MG Caps Take 400 mg by mouth daily as needed (For pain).   levothyroxine  88 MCG tablet Commonly known as: SYNTHROID  TAKE 1 TABLET BY MOUTH EVERY DAY   loratadine  10 MG tablet Commonly known as: CLARITIN  TAKE 1 TABLET BY MOUTH EVERY DAY   Magnesium 250 MG Tabs Take 420 mg by mouth daily. For leg cramps   metoprolol  succinate 25 MG 24 hr tablet Commonly known as: Toprol  XL Take 1 tablet (25 mg total) by mouth 2 (two) times daily.   potassium chloride  SA 20 MEQ tablet Commonly  known as: Klor-Con  M20 Take 1 tablet (20 mEq total) by mouth daily.   rosuvastatin  5 MG tablet Commonly known as: Crestor  Take 1 tablet (5 mg total) by mouth at bedtime.   Turmeric 500 MG Caps Take 500 mg by mouth as needed. 3 times weekly, 1 tab po   valsartan  160 MG tablet Commonly known as: DIOVAN  TAKE 1 TABLET BY MOUTH EVERY DAY   Vitamin D3 50 MCG (2000 UT) Tabs Take 2,000 Units by mouth daily.        Allergies:  Allergies  Allergen Reactions   Penicillins Other (See Comments)    Causes severe, difficult to treat yeast infections    Past Medical History, Surgical history, Social history, and Family History were reviewed and updated.  Review of Systems: Review of Systems  Constitutional: Negative.   HENT: Negative.    Eyes: Negative.   Respiratory: Negative.    Cardiovascular: Negative.   Gastrointestinal: Negative.   Genitourinary: Negative.   Musculoskeletal: Negative.   Skin: Negative.   Neurological: Negative.   Endo/Heme/Allergies: Negative.   Psychiatric/Behavioral: Negative.       Physical Exam:  height is 5' 3 (1.6 m) and weight is 239 lb (108.4 kg). Her oral temperature is 97.5 F (36.4 C) (abnormal). Her blood pressure is 131/63 and her pulse is 56 (abnormal). Her respiration is 18 and oxygen saturation is 100%.   Wt Readings from Last 3 Encounters:  12/14/23 239 lb (108.4 kg)  11/18/23 240 lb 3.2 oz (109 kg)  09/29/23 240 lb (108.9 kg)    Physical Exam Vitals reviewed.  HENT:     Head: Normocephalic and atraumatic.  Eyes:     Pupils: Pupils are equal, round, and reactive to light.  Cardiovascular:     Rate and Rhythm: Normal rate and regular rhythm.     Heart sounds: Normal heart sounds.  Pulmonary:     Effort: Pulmonary effort is normal.     Breath sounds: Normal breath sounds.  Abdominal:     General: Bowel sounds are normal.     Palpations: Abdomen is soft.  Musculoskeletal:        General: No tenderness or deformity. Normal  range of motion.     Cervical back: Normal range of motion.  Lymphadenopathy:     Cervical: No cervical adenopathy.  Skin:    General: Skin is warm and dry.     Findings: No erythema or rash.  Neurological:     Mental Status: She is alert and oriented to person, place, and time.  Psychiatric:        Behavior: Behavior normal.        Thought Content: Thought content normal.        Judgment: Judgment normal.  Lab Results  Component Value Date   WBC 8.5 12/14/2023   HGB 13.5 12/14/2023   HCT 41.0 12/14/2023   MCV 91.1 12/14/2023   PLT 430 (H) 12/14/2023   Lab Results  Component Value Date   FERRITIN 16 06/18/2023   IRON  107 06/18/2023   TIBC 402 06/18/2023   UIBC 295 06/18/2023   IRONPCTSAT 27 06/18/2023   Lab Results  Component Value Date   RETICCTPCT 2.1 12/14/2023   RBC 4.53 12/14/2023   RETICCTABS 79.2 04/19/2014   No results found for: KPAFRELGTCHN, LAMBDASER, KAPLAMBRATIO No results found for: IGGSERUM, IGA, IGMSERUM No results found for: STEPHANY CARLOTA BENSON MARKEL EARLA JOANNIE DOC VICK, SPEI   Chemistry      Component Value Date/Time   NA 140 12/14/2023 1038   NA 139 10/21/2023 0926   NA 138 04/27/2017 0933   K 3.3 (L) 12/14/2023 1038   K 3.3 (L) 04/27/2017 0933   CL 100 12/14/2023 1038   CO2 29 12/14/2023 1038   CO2 26 04/27/2017 0933   BUN 15 12/14/2023 1038   BUN 14 10/21/2023 0926   BUN 12.2 04/27/2017 0933   CREATININE 0.71 12/14/2023 1038   CREATININE 0.7 04/27/2017 0933   GLU 81 08/29/2019 0000      Component Value Date/Time   CALCIUM  9.4 12/14/2023 1038   CALCIUM  9.1 04/27/2017 0933   ALKPHOS 103 12/14/2023 1038   ALKPHOS 139 04/27/2017 0933   AST 21 12/14/2023 1038   AST 25 04/27/2017 0933   ALT 17 12/14/2023 1038   ALT 19 04/27/2017 0933   BILITOT 0.4 12/14/2023 1038   BILITOT 0.58 04/27/2017 0933     Encounter Diagnoses  Name Primary?   Iron  deficiency anemia secondary to  inadequate dietary iron  intake Yes   Thrombocytosis    Abnormal laboratory test    Abnormal x-ray of bone of lower leg     Impression and Plan: Ms. Geiger is a very pleasant 58 y.o. African American female with history of mild thrombocytosis as well as intermittent iron  deficiency anemia secondary to malabsorption. She also has chronic mild thrombocytosis which is felt to be secondary to IDA. She has no history of splenomegaly. She does have a history of JAK2 variant.   Today CBC shows a normal WBC of 8.5, normal Hgb of 13.5, platelet count of 430  MR Tibia to rule out sclerotic lesion. May be related to chronic iron  deficiency. If she is due for IV iron  we will ideally do this after her MRI (reviewed with patient) Follow up with Neuro for her IIH work up JAK2 repeat at follow up.   RTC 2 months APP, labs (CBC w/, CMP, LDH, retic, ferritin, iron , JAK2)   Lauraine CHRISTELLA Dais, PA-C 7/21/202511:39 AM

## 2023-12-15 ENCOUNTER — Other Ambulatory Visit: Payer: Self-pay | Admitting: Internal Medicine

## 2023-12-15 DIAGNOSIS — M2550 Pain in unspecified joint: Secondary | ICD-10-CM

## 2023-12-21 ENCOUNTER — Encounter: Payer: Self-pay | Admitting: Neurology

## 2023-12-21 ENCOUNTER — Ambulatory Visit (INDEPENDENT_AMBULATORY_CARE_PROVIDER_SITE_OTHER): Payer: No Typology Code available for payment source | Admitting: Neurology

## 2023-12-21 VITALS — BP 106/69 | HR 60 | Ht 63.0 in | Wt 230.6 lb

## 2023-12-21 DIAGNOSIS — R519 Headache, unspecified: Secondary | ICD-10-CM

## 2023-12-21 DIAGNOSIS — R4789 Other speech disturbances: Secondary | ICD-10-CM | POA: Diagnosis not present

## 2023-12-21 DIAGNOSIS — E610 Copper deficiency: Secondary | ICD-10-CM | POA: Diagnosis not present

## 2023-12-21 DIAGNOSIS — R4189 Other symptoms and signs involving cognitive functions and awareness: Secondary | ICD-10-CM | POA: Diagnosis not present

## 2023-12-21 DIAGNOSIS — G8929 Other chronic pain: Secondary | ICD-10-CM

## 2023-12-21 NOTE — Progress Notes (Unsigned)
 [facility]  PATIENT: [name] DOB: 1965-11-13  REQUESTING CLINICIAN: Theophilus Andrews, Est[place]* HISTORY FROM: Patient  REASON FOR VISIT: Word finding difficulty    HISTORICAL  CHIEF COMPLAINT:  Chief Complaint  Patient presents with   Follow-up    Room 12 pt is alone, follow up for memory and she stated that she thinks that she has to much fluid around the brain ,she has notice that it has been running into her nose and ears having more headaches this has been going on for a month , she stated that it dull /pressure headaches . She it has trouble to finding words when this happens.    INTERVAL HISTORY 12/21/2023 Patient presents today for follow-up, last visit was in January.  She tells me that her word finding difficulty is not improving, sometimes feel like is getting worse.  She is still complaining of headaches, frontal headaches, previous MRI was normal except for partially empty sella wants to rule out IIH.  Ibuprofen  does help but does not take it daily because she is also on Celebrex . Tylenol  provide minimal relief. She does have a history of sleep apnea and uses her CPAP religiously. He is following up with oncology, reports being evaluated for JAK2 mutation.   INTERVAL HISTORY 06/02/2023 Patient presents today for follow-up, last visit was in June.  Since then she tells me that her word finding difficulty is about the same or even getting worse.  She is more forgetful than before.  She also complaining of heaviness on the left side of her head and lightheadedness.  These episodes started around Thanksgiving and they are still present.  She tells me that these headaches are not debilitating, she can function but  it is noticeable. She continues to get B12 shots     HISTORY OF PRESENT ILLNESS:  This is a 58 year old woman past medical history of hypertension, hyperlipidemia, heart disease, obstructive sleep apnea on CPAP, vitamin B12 deficiency who  is presenting with complaint of word finding difficulty.  Patient reports the word finding difficulty started since last year, early last year.  She had difficulty finding the right word.  At that time she was also having heart trouble, and episode of presyncope. She was found to be in heart block and had a pacemaker in July 2023. Right after the pacemaker placement, her word finding difficulty got better but now she is having worsening problems, more finding difficulty and memory problem.  She does live at home with her husband, her husband has complained about her memory problem, her forgetfulness but she is still independent in all activities of daily living.  She has to write thing down, on one occasion, left the stove on. She also has been reporting falls, she reported a total of 3 falls this year, unexplained fall, denies any mechanical fall, no prodrome of lightheadedness change in vision prior to the fall.  2 falls at home and 1 at [facility]. She denies any major injury from this fall.  Her last fall was 2 months ago.    OTHER MEDICAL CONDITIONS: Hypertension, Hyperlipidemia, Heart disease, B12 deficiency, OSA on CPAP   REVIEW OF SYSTEMS: Full 14 system review of systems performed and negative with exception of: As noted in the HPI   ALLERGIES: Allergies  Allergen Reactions   Penicillins Other (See Comments)    Causes severe, difficult to treat yeast infections    HOME MEDICATIONS: Outpatient Medications Prior to Visit  Medication Sig Dispense Refill  acetaminophen  (TYLENOL ) 325 MG tablet Take 1-2 tablets (325-650 mg total) by mouth every 4 (four) hours as needed for mild pain.     Biotin 1000 MCG tablet Take 1,000 mcg by mouth every other day.     celecoxib  (CELEBREX ) 200 MG capsule TAKE 1 CAPSULE BY MOUTH DAILY AS NEEDED FOR PAIN 30 capsule 1   cetirizine  (ZYRTEC  ALLERGY) 10 MG tablet Take 1 tablet (10 mg total) by mouth daily. 30 tablet 0   Cholecalciferol  (VITAMIN D3) 50 MCG (2000  UT) TABS Take 2,000 Units by mouth daily.     COLLAGEN PO Take by mouth. Liquid collagen with biotin     cyanocobalamin  (VITAMIN B12) 1000 MCG/ML injection Inject 1 ml into the muscle once a week for a month.  Then inject 1 ml into the muscle once a month thereafter. 6 mL 11   famotidine (PEPCID) 40 MG tablet Take 40 mg by mouth 2 (two) times daily.     fluticasone  (FLONASE ) 50 MCG/ACT nasal spray Place 2 sprays into both nostrils daily. 16 g 6   furosemide  (LASIX ) 40 MG tablet Take 1 tablet (40 mg total) by mouth daily. 90 tablet 3   Glucosamine HCl (GLUCOSAMINE PO) Take 1,000 mg by mouth daily.     hydrochlorothiazide  (HYDRODIURIL ) 25 MG tablet TAKE 1 TABLET (25 MG TOTAL) BY MOUTH DAILY. 90 tablet 0   Ibuprofen  200 MG CAPS Take 400 mg by mouth daily as needed (For pain).     levothyroxine  (SYNTHROID ) 88 MCG tablet TAKE 1 TABLET BY MOUTH EVERY DAY 90 tablet 0   loratadine  (CLARITIN ) 10 MG tablet TAKE 1 TABLET BY MOUTH EVERY DAY 90 tablet 2   Magnesium 250 MG TABS Take 420 mg by mouth daily. For leg cramps     Menthol , Topical Analgesic, (BIOFREEZE EX) Apply 1 application  topically daily as needed (On Knees and feet).     metoprolol  succinate (TOPROL  XL) 25 MG 24 hr tablet Take 1 tablet (25 mg total) by mouth 2 (two) times daily. 180 tablet 3   potassium chloride  SA (KLOR-CON  M20) 20 MEQ tablet Take 1 tablet (20 mEq total) by mouth daily. 90 tablet 3   rosuvastatin  (CRESTOR ) 5 MG tablet Take 1 tablet (5 mg total) by mouth at bedtime. 30 tablet 11   SYRINGE-NEEDLE, DISP, 3 ML (BD SAFETYGLIDE SYRINGE/NEEDLE) 25G X 1 3 ML MISC Use for B12 injections 100 each 3   Turmeric 500 MG CAPS Take 500 mg by mouth as needed. 3 times weekly, 1 tab po     valsartan  (DIOVAN ) 160 MG tablet TAKE 1 TABLET BY MOUTH EVERY DAY 90 tablet 3   No facility-administered medications prior to visit.    PAST MEDICAL HISTORY: Past Medical History:  Diagnosis Date   A-fib [facility])    Allergic rhinitis, cause unspecified     Allergy 2016   Anemia, iron  deficiency    Arthritis    Back pain    CFS (chronic fatigue syndrome)    Chest pain    COVID-19 06/15/2020   Diastolic dysfunction    Diverticulosis of colon    Edema, lower extremity    Fatty liver    Gallbladder problem    GERD (gastroesophageal reflux disease)    Goiter    Hiatal hernia    Hypertension    Hypothyroidism    IBS (irritable bowel syndrome)    Joint pain    Morbid obesity (HCC)    Obstructive sleep apnea    OSA on  CPAP    mild obstructive sleep apnea with an AHI of 13/h.  On auto CPAP from 4 to 15cm H2O   Osteoarthritis    Pacemaker    SOB (shortness of breath)    Thrombocytosis    Undiagnosed cardiac murmurs    Vitamin B 12 deficiency    Vitamin D  deficiency     PAST SURGICAL HISTORY: Past Surgical History:  Procedure Laterality Date   BARIATRIC SURGERY  06/17/2010   BILATERAL SALPINGECTOMY Bilateral 06/22/2014   Procedure: BILATERAL SALPINGECTOMY;  Surgeon: Ovid All, MD;  Location: [facility];  Service: Gynecology;  Laterality: Bilateral;   CYSTOSCOPY N/A 06/22/2014   Procedure: CYSTOSCOPY;  Surgeon: Ovid All, MD;  Location: [facility];  Service: Gynecology;  Laterality: N/A;   ENDOMETRIAL ABLATION  04/2007   FOOT SURGERY Left 11/24/2017   Dr Magdalen   LAPAROSCOPIC ASSISTED VAGINAL HYSTERECTOMY N/A 06/22/2014   Procedure: LAPAROSCOPIC ASSISTED VAGINAL HYSTERECTOMY;  Surgeon: Ovid All, MD;  Location: [facility];  Service: Gynecology;  Laterality: N/A;   LEAD REVISION/REPAIR N/A 12/17/2021   Procedure: LEAD REVISION/REPAIR;  Surgeon: Waddell Danelle ORN, MD;  Location: [facility];  Service: Cardiovascular;  Laterality: N/A;   LIPOMA EXCISION     upper back   PACEMAKER IMPLANT N/A 12/09/2021   Procedure: PACEMAKER IMPLANT;  Surgeon: Waddell Danelle ORN, MD;  Location: [facility];  Service: Cardiovascular;  Laterality: N/A;   SPINE SURGERY  02/10/2012   L4-l5 fusion--cohen    FAMILY HISTORY: Family History   Problem Relation Age of Onset   Breast cancer Mother 85   Arrhythmia Mother    Heart disease Mother    Arthritis Mother    Diabetes Mother    Hyperlipidemia Mother    Hypertension Mother    Thyroid  disease Mother    Anxiety disorder Mother    Hypertension Father    Kidney cancer Father    Colon polyps Father    Lymphoma Father        Diffused Large B-cell Lymphoma   Cancer Father    Obesity Father    Stroke Maternal Grandmother 82   Ovarian cancer Paternal Grandmother    Healthy Daughter    Healthy Daughter    Healthy Daughter    Breast cancer Maternal Aunt 58   Colon cancer Neg Hx     SOCIAL HISTORY: Social History   Socioeconomic History   Marital status: Married    Spouse name: Cordella   Number of children: 3   Years of education: Some college   Highest education level: Some college, no degree  Occupational History   Occupation: Herbalist: UNEMPLOYED   Occupation: stay at home mom  Tobacco Use   Smoking status: Never    Passive exposure: Never   Smokeless tobacco: Never   Tobacco comments:    Never Used Tobacco  Vaping Use   Vaping status: Never Used  Substance and Sexual Activity   Alcohol use: No   Drug use: No   Sexual activity: Yes    Partners: Male    Birth control/protection: Surgical    Comment: Hysterectomy  Other Topics Concern   Not on file  Social History Narrative   09/21/19   From: [place], [place] originally   Living: with husband, Cordella and 2 daughters w/ family   Work: homemaker      Family: 3 children - [place], [place], and Hospital doctor. Has 8 grandchildren and one on the way  Enjoys: spend time with family, shopping      Exercise: trying to walk more   Diet: eats late at night, will go most of the day w/o eating      Safety   Seat belts: Yes    Guns: Yes  and secure   Safe in relationships: Yes    Social Drivers of Health   Financial Resource Strain: Low Risk  (09/21/2023)   Overall Financial Resource Strain  (CARDIA)    Difficulty of Paying Living Expenses: Not hard at all  Food Insecurity: No Food Insecurity (09/21/2023)   Hunger Vital Sign    Worried About Running Out of Food in the Last Year: Never true    Ran Out of Food in the Last Year: Never true  Transportation Needs: No Transportation Needs (09/21/2023)   PRAPARE - Administrator, Civil Service (Medical): No    Lack of Transportation (Non-Medical): No  Physical Activity: Unknown (09/21/2023)   Exercise Vital Sign    Days of Exercise per Week: Patient declined    Minutes of Exercise per Session: 10 min  Stress: No Stress Concern Present (09/21/2023)   [employer] of Occupational Health - Occupational Stress Questionnaire    Feeling of Stress : Not at all  Social Connections: Moderately Integrated (09/21/2023)   Social Connection and Isolation Panel    Frequency of Communication with Friends and Family: More than three times a week    Frequency of Social Gatherings with Friends and Family: Three times a week    Attends Religious Services: More than 4 times per year    Active Member of Clubs or Organizations: No    Attends Banker Meetings: Not on file    Marital Status: Married  Catering manager Violence: Not on file    PHYSICAL EXAM  GENERAL EXAM/CONSTITUTIONAL: Vitals:  Vitals:   12/21/23 0907  BP: 106/69  Pulse: 60  Weight: 230 lb 9.6 oz (104.6 kg)  Height: 5' 3 (1.6 m)   Body mass index is 40.85 kg/m. Wt Readings from Last 3 Encounters:  12/21/23 230 lb 9.6 oz (104.6 kg)  12/14/23 239 lb (108.4 kg)  11/18/23 240 lb 3.2 oz (109 kg)   Patient is in no distress; well developed, nourished and groomed; neck is supple  MUSCULOSKELETAL: Gait, strength, tone, movements noted in Neurologic exam below  NEUROLOGIC: MENTAL STATUS:      No data to display            12/21/2023    9:19 AM  Montreal Cognitive Assessment   Visuospatial/ Executive (0/5) 1  Naming (0/3) 3  Attention: Read  list of digits (0/2) 2  Attention: Read list of letters (0/1) 1  Attention: Serial 7 subtraction starting at 100 (0/3) 3  Language: Repeat phrase (0/2) 2  Language : Fluency (0/1) 0  Abstraction (0/2) 2  Delayed Recall (0/5) 4  Orientation (0/6) 6  Total 24    awake, alert, oriented to person, place and time recent and remote memory intact normal attention and concentration language fluent, comprehension intact, naming intact fund of knowledge appropriate  CRANIAL NERVE:  2nd, 3rd, 4th, 6th - Visual fields full to confrontation, extraocular muscles intact, no nystagmus 5th - facial sensation symmetric 7th - facial strength symmetric 8th - hearing intact 9th - palate elevates symmetrically, uvula midline 11th - shoulder shrug symmetric 12th - tongue protrusion midline  MOTOR:  normal bulk and tone, full strength in the BUE, BLE  SENSORY:  normal and symmetric to light touch  COORDINATION:  finger-nose-finger, fine finger movements normal  GAIT/STATION:  normal   DIAGNOSTIC DATA (LABS, IMAGING, TESTING) - I reviewed patient records, labs, notes, testing and imaging myself where available.  Lab Results  Component Value Date   WBC 8.5 12/14/2023   HGB 13.5 12/14/2023   HCT 41.0 12/14/2023   MCV 91.1 12/14/2023   PLT 430 (H) 12/14/2023      Component Value Date/Time   NA 140 12/14/2023 1038   NA 139 10/21/2023 0926   NA 138 04/27/2017 0933   K 3.3 (L) 12/14/2023 1038   K 3.3 (L) 04/27/2017 0933   CL 100 12/14/2023 1038   CO2 29 12/14/2023 1038   CO2 26 04/27/2017 0933   GLUCOSE 139 (H) 12/14/2023 1038   GLUCOSE 85 04/27/2017 0933   BUN 15 12/14/2023 1038   BUN 14 10/21/2023 0926   BUN 12.2 04/27/2017 0933   CREATININE 0.71 12/14/2023 1038   CREATININE 0.7 04/27/2017 0933   CALCIUM  9.4 12/14/2023 1038   CALCIUM  9.1 04/27/2017 0933   PROT 7.0 12/14/2023 1038   PROT 7.0 10/21/2023 0926   PROT 7.5 04/27/2017 0933   ALBUMIN 4.5 12/14/2023 1038   ALBUMIN  4.5 10/21/2023 0926   ALBUMIN 4.0 04/27/2017 0933   AST 21 12/14/2023 1038   AST 25 04/27/2017 0933   ALT 17 12/14/2023 1038   ALT 19 04/27/2017 0933   ALKPHOS 103 12/14/2023 1038   ALKPHOS 139 04/27/2017 0933   BILITOT 0.4 12/14/2023 1038   BILITOT 0.58 04/27/2017 0933   GFRNONAA >60 12/14/2023 1038   GFRAA >60 01/27/2020 0923   GFRAA >60 12/12/2019 1036   Lab Results  Component Value Date   CHOL 203 (H) 06/11/2023   HDL 65 06/11/2023   LDLCALC 126 (H) 06/11/2023   TRIG 64 06/11/2023   CHOLHDL 3 09/09/2022   Lab Results  Component Value Date   HGBA1C 5.6 09/22/2023   Lab Results  Component Value Date   VITAMINB12 827 06/02/2023   Lab Results  Component Value Date   TSH 1.910 06/11/2023   MRI Brain 01/27/2023 1. No evidence of an acute intracranial abnormality. 2. Multifocal T2 FLAIR hyperintense signal abnormality within the cerebral white matter, mild but greater than expected for age. These signal changes are nonspecific and differential considerations include chronic small ischemic disease and sequelae of chronic migraine headaches, among others. 3. Partially empty sella turcica. This finding can reflect incidental anatomic variation, or alternatively, it can be associated with idiopathic intracranial hypertension (pseudotumor cerebri).   ASSESSMENT AND PLAN  58 y.o. year old female with hypertension, hyperlipidemia, heart disease, obstructive sleep apnea on CPAP, vitamin B12 deficiency who is presenting with complaint of word finding difficulty, memory loss and ongoing headaches.  MRI brain shows small vessel disease and partially empty sella, otherwise no acute abnormality.  Plan will be to refer patient to speech therapy for ongoing word finding difficulty and brain fog and I will obtain a lumbar puncture for evaluation of IIH.  If elevated opening pressure, will start medication such as acetazolamide.  This was discussed with patient and she is comfortable with plan.   Continue to follow with PCP and return in 6 months or sooner if worse.    1. Copper  deficiency   2. Word finding difficulty   3. Brain fog   4. Chronic nonintractable headache, unspecified headache type      Patient Instructions  Due to chronicity of headaches, will obtain  LP for opening pressure. MRI Brain with partially empty sella  Referral to speech therapy for ongoing word finding difficulty  Will recheck copper  level today Return in 6 to 8 months or sooner if worse      Orders Placed This Encounter  Procedures   DG FL GUIDED LUMBAR PUNCTURE   Copper , serum   Ambulatory referral to Speech Therapy    No orders of the defined types were placed in this encounter.   Return in about 6 months (around 06/22/2024).   Pastor Falling, MD 12/22/2023, 8:00 AM  [facility] [address] [place], [place] [address] [phone]

## 2023-12-21 NOTE — Patient Instructions (Addendum)
 Due to chronicity of headaches, will obtain LP for opening pressure. MRI Brain with partially empty sella  Referral to speech therapy for ongoing word finding difficulty  Will recheck copper  level today Return in 6 to 8 months or sooner if worse

## 2023-12-22 ENCOUNTER — Encounter: Payer: Self-pay | Admitting: Neurology

## 2023-12-23 ENCOUNTER — Ambulatory Visit: Payer: Self-pay | Admitting: Neurology

## 2023-12-23 LAB — COPPER, SERUM: Copper: 106 ug/dL (ref 80–158)

## 2024-01-04 ENCOUNTER — Encounter: Payer: Self-pay | Admitting: Internal Medicine

## 2024-01-08 ENCOUNTER — Inpatient Hospital Stay (HOSPITAL_COMMUNITY): Admission: RE | Admit: 2024-01-08 | Source: Ambulatory Visit

## 2024-01-08 NOTE — Progress Notes (Signed)
  Device system confirmed to be MRI conditional, with implant date > 6 weeks ago, and no evidence of abandoned or epicardial leads in review of most recent CXR  Device last cleared by EP Provider: Charlies Arthur, PA  Clearance is good through for 1 year as long as parameters remain stable at time of check. If pt undergoes a cardiac device procedure during that time, they should be re-cleared.   Tachy-therapies to be programmed off if applicable with device back to pre-MRI settings after completion of exam.  [employer] was available remotely to assist in programming recommendations.   Suzan Recardo Arna Debby  01/08/2024 10:16 AM

## 2024-01-14 ENCOUNTER — Ambulatory Visit (HOSPITAL_COMMUNITY)
Admission: RE | Admit: 2024-01-14 | Discharge: 2024-01-14 | Disposition: A | Discharge status: Home / Self Care | Source: Ambulatory Visit | Attending: Medical Oncology | Admitting: Medical Oncology

## 2024-01-14 DIAGNOSIS — R899 Unspecified abnormal finding in specimens from other organs, systems and tissues: Secondary | ICD-10-CM | POA: Insufficient documentation

## 2024-01-14 DIAGNOSIS — R936 Abnormal findings on diagnostic imaging of limbs: Secondary | ICD-10-CM | POA: Diagnosis present

## 2024-01-14 MED ORDER — GADOBUTROL 1 MMOL/ML IV SOLN
10.0000 mL | Freq: Once | INTRAVENOUS | Status: AC | PRN
  Administered 2024-01-14: 10 mL via INTRAVENOUS

## 2024-01-14 NOTE — Progress Notes (Signed)
 Patient was monitored by this RN during MRI scan due to presence of a pacemaker. Cardiac rhythm was continuously monitored throughout the procedure. Prior to the start of the scan, the pacemaker was placed in MRI-safe mode by the MRI technician and/or pacemaker representative. Following the completion of the scan, the device was returned to its pre-MRI settings. Neurological status and orientation post-procedure were unchanged from baseline.   Pre-procedure Heart Rate (Prior to being placed in MRI safe mode): 59 Post-procedure Heart Rate (Once pacemaker is returned to baseline mode):  59  Patient was programmed by MRI tech for AOO 70.

## 2024-01-18 ENCOUNTER — Ambulatory Visit: Payer: Self-pay | Admitting: Medical Oncology

## 2024-01-18 ENCOUNTER — Other Ambulatory Visit: Payer: Self-pay | Admitting: Medical Oncology

## 2024-01-18 DIAGNOSIS — R936 Abnormal findings on diagnostic imaging of limbs: Secondary | ICD-10-CM

## 2024-01-18 DIAGNOSIS — R937 Abnormal findings on diagnostic imaging of other parts of musculoskeletal system: Secondary | ICD-10-CM

## 2024-01-20 ENCOUNTER — Telehealth: Payer: Self-pay

## 2024-01-20 ENCOUNTER — Other Ambulatory Visit: Payer: Self-pay | Admitting: Medical Oncology

## 2024-01-20 DIAGNOSIS — R936 Abnormal findings on diagnostic imaging of limbs: Secondary | ICD-10-CM

## 2024-01-20 DIAGNOSIS — R937 Abnormal findings on diagnostic imaging of other parts of musculoskeletal system: Secondary | ICD-10-CM

## 2024-01-20 NOTE — Telephone Encounter (Signed)
 Per Lauraine Dais, PA Reference number: [id number] - peer to peer complete- they will not require a bone biopsy as previously requested due to the small size and area but have requested a CT Chest/abd.pelvis first which I have ordered.

## 2024-01-22 ENCOUNTER — Ambulatory Visit: Payer: Self-pay | Admitting: Neurology

## 2024-01-22 ENCOUNTER — Ambulatory Visit (HOSPITAL_COMMUNITY)
Admission: RE | Admit: 2024-01-22 | Discharge: 2024-01-22 | Disposition: A | Discharge status: Home / Self Care | Source: Ambulatory Visit | Attending: Neurology | Admitting: Neurology

## 2024-01-22 ENCOUNTER — Other Ambulatory Visit: Payer: Self-pay | Admitting: Neurology

## 2024-01-22 DIAGNOSIS — R519 Headache, unspecified: Secondary | ICD-10-CM | POA: Diagnosis present

## 2024-01-22 DIAGNOSIS — R4189 Other symptoms and signs involving cognitive functions and awareness: Secondary | ICD-10-CM

## 2024-01-22 DIAGNOSIS — R4789 Other speech disturbances: Secondary | ICD-10-CM

## 2024-01-22 DIAGNOSIS — G8929 Other chronic pain: Secondary | ICD-10-CM | POA: Diagnosis not present

## 2024-01-22 DIAGNOSIS — E610 Copper deficiency: Secondary | ICD-10-CM

## 2024-01-22 HISTORY — PX: IR LUMBAR PUNCTURE: IMG944

## 2024-01-22 LAB — CSF CELL COUNT WITH DIFFERENTIAL
RBC Count, CSF: 190 /mm3 — ABNORMAL HIGH
Tube #: 1
WBC, CSF: 3 /mm3 (ref 0–5)

## 2024-01-22 LAB — PROTEIN, CSF: Total  Protein, CSF: 23 mg/dL (ref 15–45)

## 2024-01-22 LAB — PROTEIN AND GLUCOSE, CSF
Glucose, CSF: 57 mg/dL (ref 40–70)
Total  Protein, CSF: 23 mg/dL (ref 15–45)

## 2024-01-22 MED ORDER — LIDOCAINE-EPINEPHRINE 1 %-1:100000 IJ SOLN
INTRAMUSCULAR | Status: AC
  Filled 2024-01-22: qty 1

## 2024-01-22 MED ORDER — LIDOCAINE 1 % OPTIME INJ - NO CHARGE
5.0000 mL | Freq: Once | INTRAMUSCULAR | Status: AC
  Administered 2024-01-22: 5 mL

## 2024-01-22 MED ORDER — LIDOCAINE HCL (PF) 1 % IJ SOLN
INTRAMUSCULAR | Status: AC
  Filled 2024-01-22: qty 30

## 2024-01-22 NOTE — Discharge Instructions (Signed)
 Myelogram and Lumbar Puncture Discharge Instructions  Go home and rest quietly for the next 24 hours.  It is important to lie flat for the next 24 hours.  Get up only to go to the restroom.  You may lie in the bed or on a couch on your back, your stomach, your left side or your right side.  You may have one pillow under your head.  You may have pillows between your knees while you are on your side or under your knees while you are on your back.  DO NOT drive today.  Recline the seat as far back as it will go, while still wearing your seat belt, on the way home.  You may get up to go to the bathroom as needed.  You may sit up for 10 minutes to eat.  You may resume your normal diet and medications unless otherwise indicated.  The incidence of headache, nausea, or vomiting is about 5% (one in 20 patients).  If you develop a headache, lie flat and drink plenty of fluids until the headache goes away.  Caffeinated beverages may be helpful.  If you develop severe nausea and vomiting or a headache that does not go away with flat bed rest, call [phone].  You may resume normal activities after your 24 hours of bed rest is over; however, do not exert yourself strongly or do any heavy lifting tomorrow.  Call your physician for a follow-up appointment.  The results of your myelogram will be sent directly to your physician by the following day.   Discharge instructions have been explained to the patient.  The patient, or the person responsible for the patient, fully understands these instructions.

## 2024-01-26 ENCOUNTER — Encounter: Payer: Self-pay | Admitting: Neurology

## 2024-02-01 ENCOUNTER — Encounter (HOSPITAL_COMMUNITY)

## 2024-02-01 ENCOUNTER — Encounter (HOSPITAL_BASED_OUTPATIENT_CLINIC_OR_DEPARTMENT_OTHER): Payer: Self-pay

## 2024-02-01 ENCOUNTER — Ambulatory Visit: Attending: Neurology

## 2024-02-01 ENCOUNTER — Ambulatory Visit (HOSPITAL_BASED_OUTPATIENT_CLINIC_OR_DEPARTMENT_OTHER)
Admission: RE | Admit: 2024-02-01 | Discharge: 2024-02-01 | Disposition: A | Discharge status: Home / Self Care | Source: Ambulatory Visit | Attending: Medical Oncology | Admitting: Medical Oncology

## 2024-02-01 DIAGNOSIS — R937 Abnormal findings on diagnostic imaging of other parts of musculoskeletal system: Secondary | ICD-10-CM | POA: Insufficient documentation

## 2024-02-01 DIAGNOSIS — R936 Abnormal findings on diagnostic imaging of limbs: Secondary | ICD-10-CM | POA: Diagnosis present

## 2024-02-01 DIAGNOSIS — R4189 Other symptoms and signs involving cognitive functions and awareness: Secondary | ICD-10-CM | POA: Insufficient documentation

## 2024-02-01 DIAGNOSIS — R41841 Cognitive communication deficit: Secondary | ICD-10-CM | POA: Diagnosis present

## 2024-02-01 DIAGNOSIS — R4789 Other speech disturbances: Secondary | ICD-10-CM | POA: Insufficient documentation

## 2024-02-01 MED ORDER — IOHEXOL 300 MG/ML  SOLN
100.0000 mL | Freq: Once | INTRAMUSCULAR | Status: AC | PRN
  Administered 2024-02-01: 100 mL via INTRAVENOUS

## 2024-02-01 NOTE — Therapy (Signed)
 OUTPATIENT SPEECH LANGUAGE PATHOLOGY EVALUATION   Patient Name: [name] MRN: [id number] DOB:03/04/1966, 58 y.o., female Today's Date: 02/01/2024  PCP: Theophilus Andrews, Tully, MD REFERRING PROVIDER: Gregg Lek, MD  END OF SESSION:  End of Session - 02/01/24 1225     Visit Number 1    Number of Visits 5    Date for SLP Re-Evaluation 03/26/24    SLP Start Time 0935    SLP Stop Time  1017    SLP Time Calculation (min) 42 min    Activity Tolerance Patient tolerated treatment well          Past Medical History:  Diagnosis Date   A-fib (HCC)    Allergic rhinitis, cause unspecified    Allergy 2016   Anemia, iron  deficiency    Arthritis    Back pain    CFS (chronic fatigue syndrome)    Chest pain    COVID-19 06/15/2020   Diastolic dysfunction    Diverticulosis of colon    Edema, lower extremity    Fatty liver    Gallbladder problem    GERD (gastroesophageal reflux disease)    Goiter    Hiatal hernia    Hypertension    Hypothyroidism    IBS (irritable bowel syndrome)    Joint pain    Morbid obesity (HCC)    Obstructive sleep apnea    OSA on CPAP    mild obstructive sleep apnea with an AHI of 13/h.  On auto CPAP from 4 to 15cm H2O   Osteoarthritis    Pacemaker    SOB (shortness of breath)    Thrombocytosis    Undiagnosed cardiac murmurs    Vitamin B 12 deficiency    Vitamin D  deficiency    Past Surgical History:  Procedure Laterality Date   BARIATRIC SURGERY  06/17/2010   BILATERAL SALPINGECTOMY Bilateral 06/22/2014   Procedure: BILATERAL SALPINGECTOMY;  Surgeon: Ovid All, MD;  Location: [facility];  Service: Gynecology;  Laterality: Bilateral;   CYSTOSCOPY N/A 06/22/2014   Procedure: CYSTOSCOPY;  Surgeon: Ovid All, MD;  Location: [facility];  Service: Gynecology;  Laterality: N/A;   ENDOMETRIAL ABLATION  04/2007   FOOT SURGERY Left 11/24/2017   Dr Magdalen   IR LUMBAR PUNCTURE  01/22/2024   LAPAROSCOPIC ASSISTED VAGINAL HYSTERECTOMY N/A  06/22/2014   Procedure: LAPAROSCOPIC ASSISTED VAGINAL HYSTERECTOMY;  Surgeon: Ovid All, MD;  Location: [facility];  Service: Gynecology;  Laterality: N/A;   LEAD REVISION/REPAIR N/A 12/17/2021   Procedure: LEAD REVISION/REPAIR;  Surgeon: Waddell Danelle ORN, MD;  Location: [facility];  Service: Cardiovascular;  Laterality: N/A;   LIPOMA EXCISION     upper back   PACEMAKER IMPLANT N/A 12/09/2021   Procedure: PACEMAKER IMPLANT;  Surgeon: Waddell Danelle ORN, MD;  Location: [facility];  Service: Cardiovascular;  Laterality: N/A;   SPINE SURGERY  02/10/2012   L4-l5 fusion--cohen   Patient Active Problem List   Diagnosis Date Noted   Prediabetes 06/26/2023   Chronic heart failure with preserved ejection fraction (HCC) 06/26/2023   Class 3 severe obesity with serious comorbidity and body mass index (BMI) of 40.0 to 44.9 in adult 06/11/2023   Eating disorder 06/11/2023   Metabolic dysfunction-associated steatotic liver disease (MASLD) 06/11/2023   Nutritional deficiency due to Roux en Y 06/11/2023   Atrial fibrillation (HCC) 02/12/2022   Pacemaker complications 12/17/2021   Transient complete heart block (HCC) 12/16/2021   Pacemaker 12/16/2021   SOB (shortness of breath) 09/17/2021   Murmur  09/17/2021   Chest pain of uncertain etiology 09/17/2021   Acute pain of right shoulder 01/01/2021   Sinus congestion 12/17/2020   Abnormal stools 01/24/2020   Dizziness 01/24/2020   Leg swelling 01/24/2020   Primary generalized (osteo)arthritis 09/21/2019   Elevated serum alkaline phosphatase level 09/21/2019   Rash 09/21/2019   Left hip pain 09/21/2019   Bradycardia 11/11/2018   Vitamin D  deficiency 11/11/2018   Vitamin B12 deficiency 11/11/2018   Hypokalemia 11/11/2018   Thrombocytosis 11/11/2018   Bleeding 12/02/2017   Polymenorrhea 12/02/2017   Polyp of cervix 12/02/2017   Urinary incontinence 12/02/2017   Pain of right upper extremity 09/14/2017   Hypersomnia 06/05/2016    Abdominal pain, chronic, epigastric 04/13/2015   Irregular bleeding 06/22/2014   Obesity (BMI 30-39.9) 07/25/2013   Breast mass 07/25/2013   Change in skin mole 01/28/2013   Dysphagia, pharyngoesophageal phase 03/10/2011   History of Roux-en-Y gastric bypass 03/10/2011   Esophageal ring 03/10/2011   History of bariatric surgery 03/10/2011   SPINAL STENOSIS, LUMBAR 04/08/2010   Diaphragmatic hernia 03/08/2010   Connective tissue disease (HCC) 03/08/2010   DYSPNEA 12/13/2009   DIASTOLIC DYSFUNCTION 12/11/2009   ECHOCARDIOGRAM, ABNORMAL 12/11/2009   PALPITATIONS, OCCASIONAL 12/05/2009   CARDIAC MURMUR 12/05/2009   PAIN IN JOINT, MULTIPLE SITES 07/11/2009   MALAISE AND FATIGUE 07/05/2009   OSA on CPAP 09/25/2008   GERD 01/04/2008   IBS 01/04/2008   Goiter 11/30/2007   ANEMIA 08/23/2007   Allergic rhinitis 08/23/2007   Diverticulosis of colon 08/23/2007   Iron  deficiency anemia secondary to inadequate dietary iron  intake 12/23/2006   Hypothyroidism 12/17/2006   Disease of blood and blood forming organ 12/17/2006   Essential hypertension 12/17/2006   Back pain with history of spinal surgery 12/17/2006    ONSET DATE: 2023; script 12/21/23  REFERRING DIAG: R47.89 (ICD-10-CM) - Word finding difficulty R41.89 (ICD-10-CM) - Brain fog  THERAPY DIAG:  Cognitive communication deficit - Plan: SLP plan of care cert/re-cert  Rationale for Evaluation and Treatment: Rehabilitation  SUBJECTIVE:   SUBJECTIVE STATEMENT: I'm forgetting and nobody's used to me forgetting. (Anomia) happens each day.  Pt accompanied by: self  PERTINENT HISTORY:  Neuro- Dr. Gregg INTERVAL HISTORY 12/21/2023 Patient presents today for follow-up, last visit was in January.  She tells me that her word finding difficulty is not improving, sometimes feel like is getting worse.  She is still complaining of headaches, frontal headaches, previous MRI was normal except for partially empty sella wants to rule out  IIH.  Ibuprofen  does help but does not take it daily because she is also on Celebrex . Tylenol  provide minimal relief. She does have a history of sleep apnea and uses her CPAP religiously. He is following up with oncology, reports being evaluated for JAK2 mutation.   INTERVAL HISTORY 06/02/2023 Patient presents today for follow-up, last visit was in June.  Since then she tells me that her word finding difficulty is about the same or even getting worse.  She is more forgetful than before.  She also complaining of heaviness on the left side of her head and lightheadedness.  These episodes started around Thanksgiving and they are still present.  She tells me that these headaches are not debilitating, she can function but  it is noticeable. She continues to get B12 shots    HISTORY OF PRESENT ILLNESS:  This is a 58 year old woman past medical history of hypertension, hyperlipidemia, heart disease, obstructive sleep apnea on CPAP, vitamin B12 deficiency who is presenting with complaint  of word finding difficulty.  Patient reports the word finding difficulty started since last year, early last year.  She had difficulty finding the right word.  At that time she was also having heart trouble, and episode of presyncope. She was found to be in heart block and had a pacemaker in July 2023. Right after the pacemaker placement, her word finding difficulty got better but now she is having worsening problems, more finding difficulty and memory problem.  She does live at home with her husband, her husband has complained about her memory problem, her forgetfulness but she is still independent in all activities of daily living.  She has to write thing down, on one occasion, left the stove on. She also has been reporting falls, she reported a total of 3 falls this year, unexplained fall, denies any mechanical fall, no prodrome of lightheadedness change in vision prior to the fall.  2 falls at home and 1 at [facility]. She denies any  major injury from this fall.  Her last fall was 2 months ago.  PAIN:  Are you having pain? No  FALLS: Has patient fallen in last 6 months?  No  LIVING ENVIRONMENT: Lives with: lives with their spouse Lives in: House/apartment  PLOF:  Level of assistance: Independent with ADLs, Independent with IADLs Employment: Works in the home  PATIENT GOALS: Improve word finding and memory  OBJECTIVE:  Note: Objective measures were completed at Evaluation unless otherwise noted.  DIAGNOSTIC FINDINGS:  MRI Brain WO CONT- 01/27/23 IMPRESSION: 1. No evidence of an acute intracranial abnormality. 2. Multifocal T2 FLAIR hyperintense signal abnormality within the cerebral white matter, mild but greater than expected for age. These signal changes are nonspecific and differential considerations include chronic small ischemic disease and sequelae of chronic migraine headaches, among others. 3. Partially empty sella turcica. This finding can reflect incidental anatomic variation, or alternatively, it can be associated with idiopathic intracranial hypertension (pseudotumor cerebri).  COGNITION: Overall cognitive status: Within functional limits for tasks assessed Areas of impairment:  Memory: WFL Functional deficits: Pt reports that her family tells her details of scheduling and she does not retain this information - she has begun to keep a calendar but tells SLP she not always regimented about writing events on this as she should be. Subtest of Cognitive Linguistic Quick Test was WNL for generative naming of animals in 60 seconds, and appeared WNL for recall of events of a story (14/18 story elements)  COGNITIVE COMMUNICATION: Following directions: Follows multi-step commands consistently  Auditory comprehension: WFL Verbal expression: Imparied Functional communication: Impaired: pt reports daily difficulty with anomia. She uses circumlocution strategy.  ORAL MOTOR EXAMINATION: Overall status:  WFL  STANDARDIZED ASSESSMENTS: [place] NAMING TEST: Pt scored WNL for her age; 66/60 (mean 55.2/60, standard deviation 4.0)  PATIENT REPORTED OUTCOME MEASURES (PROM): Communication Effectiveness Survey: provided in first session  TREATMENT DATE:   02/01/24: n/a  PATIENT EDUCATION: Education details: anomia and memory appear to be normal aging but SLP can see pt for a very short course of ST to teach her techniques at home as well as memory strategies Person educated: Patient Education method: Explanation Education comprehension: verbalized understanding   GOALS: Goals reviewed with patient? Yes  SHORT TERM GOALS: = LONG TERM GOALS: Target date: 03/26/24 (pt will not start ST until 03/03/24)  Pt will demo SFA and/or VNeST in session with independence Baseline:  Goal status: INITIAL  2.  Pt will use one trained anomia compensation successfully in a session or between 2 sessions Baseline:  Goal status: INITIAL  3.  Pt will report using one trained memory compensation successfully in a session or between two sessions Baseline:  Goal status: INITIAL  ASSESSMENT:  CLINICAL IMPRESSION: Patient is a 58 y.o. F who was seen today for complaints of anomia and memory deficits. Pt states O2 sats were historically low (especially at night) prior to pacemaker insertion and that shortly after insertion it appeared her word finding was better. Her scoring on a test of word finding today was WNL however SLP believes pt will benefit from a very short course of ST for SLP to educate about and monitor her successful use of compensations for memory and for anomia, and to provide and train pt with home tasks to improve word finding. If pt has further questions about cognitive linguistics a neuropsychological exam may want to be considered.    OBJECTIVE IMPAIRMENTS: include memory  and expressive language, per pt. These impairments are limiting patient from household responsibilities, ADLs/IADLs, and effectively communicating at home and in community. Factors affecting potential to achieve goals and functional outcome are none noted today. Patient will benefit from skilled SLP services to address above impairments and improve overall function.  REHAB POTENTIAL: Good  PLAN:  SLP FREQUENCY: 1x/week  SLP DURATION: 4 weeks  PLANNED INTERVENTIONS: Language facilitation, Internal/external aids, Functional tasks, Multimodal communication approach, SLP instruction and feedback, Compensatory strategies, Patient/family education, and [id number] Treatment of speech (30 or 45 min)     Cyndi Montejano, CCC-SLP 02/01/2024, 12:26 PM

## 2024-02-05 ENCOUNTER — Encounter: Payer: Self-pay | Admitting: Hematology & Oncology

## 2024-02-06 ENCOUNTER — Other Ambulatory Visit: Payer: Self-pay | Admitting: Cardiology

## 2024-02-08 ENCOUNTER — Other Ambulatory Visit: Payer: Self-pay | Admitting: Medical Oncology

## 2024-02-08 ENCOUNTER — Ambulatory Visit: Payer: Self-pay | Admitting: Medical Oncology

## 2024-02-08 DIAGNOSIS — R937 Abnormal findings on diagnostic imaging of other parts of musculoskeletal system: Secondary | ICD-10-CM

## 2024-02-08 DIAGNOSIS — R936 Abnormal findings on diagnostic imaging of limbs: Secondary | ICD-10-CM

## 2024-02-13 ENCOUNTER — Other Ambulatory Visit: Payer: Self-pay | Admitting: Internal Medicine

## 2024-02-13 DIAGNOSIS — I10 Essential (primary) hypertension: Secondary | ICD-10-CM

## 2024-02-15 ENCOUNTER — Inpatient Hospital Stay (HOSPITAL_BASED_OUTPATIENT_CLINIC_OR_DEPARTMENT_OTHER): Admitting: Medical Oncology

## 2024-02-15 ENCOUNTER — Inpatient Hospital Stay: Attending: Family

## 2024-02-15 ENCOUNTER — Encounter: Payer: Self-pay | Admitting: Medical Oncology

## 2024-02-15 VITALS — BP 130/62 | HR 59 | Temp 98.1°F | Resp 18 | Ht 63.0 in | Wt 242.8 lb

## 2024-02-15 DIAGNOSIS — R930 Abnormal findings on diagnostic imaging of skull and head, not elsewhere classified: Secondary | ICD-10-CM | POA: Diagnosis not present

## 2024-02-15 DIAGNOSIS — R936 Abnormal findings on diagnostic imaging of limbs: Secondary | ICD-10-CM | POA: Diagnosis not present

## 2024-02-15 DIAGNOSIS — R899 Unspecified abnormal finding in specimens from other organs, systems and tissues: Secondary | ICD-10-CM

## 2024-02-15 DIAGNOSIS — D508 Other iron deficiency anemias: Secondary | ICD-10-CM | POA: Diagnosis not present

## 2024-02-15 DIAGNOSIS — R937 Abnormal findings on diagnostic imaging of other parts of musculoskeletal system: Secondary | ICD-10-CM

## 2024-02-15 DIAGNOSIS — D75839 Thrombocytosis, unspecified: Secondary | ICD-10-CM

## 2024-02-15 LAB — CBC WITH DIFFERENTIAL (CANCER CENTER ONLY)
Abs Immature Granulocytes: 0.02 K/uL (ref 0.00–0.07)
Basophils Absolute: 0.1 K/uL (ref 0.0–0.1)
Basophils Relative: 1 %
Eosinophils Absolute: 0.1 K/uL (ref 0.0–0.5)
Eosinophils Relative: 2 %
HCT: 39.8 % (ref 36.0–46.0)
Hemoglobin: 13.3 g/dL (ref 12.0–15.0)
Immature Granulocytes: 0 %
Lymphocytes Relative: 47 %
Lymphs Abs: 4.1 K/uL — ABNORMAL HIGH (ref 0.7–4.0)
MCH: 30.6 pg (ref 26.0–34.0)
MCHC: 33.4 g/dL (ref 30.0–36.0)
MCV: 91.7 fL (ref 80.0–100.0)
Monocytes Absolute: 0.5 K/uL (ref 0.1–1.0)
Monocytes Relative: 6 %
Neutro Abs: 3.8 K/uL (ref 1.7–7.7)
Neutrophils Relative %: 44 %
Platelet Count: 449 K/uL — ABNORMAL HIGH (ref 150–400)
RBC: 4.34 MIL/uL (ref 3.87–5.11)
RDW: 13.2 % (ref 11.5–15.5)
WBC Count: 8.6 K/uL (ref 4.0–10.5)
nRBC: 0 % (ref 0.0–0.2)

## 2024-02-15 LAB — IRON AND IRON BINDING CAPACITY (CC-WL,HP ONLY)
Iron: 86 ug/dL (ref 28–170)
Saturation Ratios: 26 % (ref 10.4–31.8)
TIBC: 330 ug/dL (ref 250–450)
UIBC: 244 ug/dL

## 2024-02-15 LAB — RETIC PANEL
Immature Retic Fract: 10.6 % (ref 2.3–15.9)
RBC.: 4.31 MIL/uL (ref 3.87–5.11)
Retic Count, Absolute: 106.5 K/uL (ref 19.0–186.0)
Retic Ct Pct: 2.5 % (ref 0.4–3.1)
Reticulocyte Hemoglobin: 34.2 pg (ref >27.9)

## 2024-02-15 LAB — FERRITIN: Ferritin: 322 ng/mL — ABNORMAL HIGH (ref 11–307)

## 2024-02-15 NOTE — Progress Notes (Signed)
 Hematology and Oncology Follow Up Visit  [name] [id number] 1965/07/09 58 y.o. 02/15/2024   Principle Diagnosis:  Thrombocytosis  - JAK2 (-)/ DNMT3A (+) Family history of lymphoma (father) Iron  deficiency anemia secondary to malabsorption with bariatric surgery (2012)    Current Therapy:        Observation IV iron  as indicated   Feraheme  given on 04/27/2018 Venofer  200 mg- 09/14/2023   Interim History:  [name] is here today for follow-up.   She states that she has been doing fair. She recently saw rheumatology at [facility] who did some imaging and testing. She had an x ray of her right ankle which showed a small nonspecific sclerotic lesion in the distal tibia. MRI confirmed area of concern and recommended additional imaging. We have since completed a chest CT C/A/P per insurance request and she has an upcoming PET scan planned. Fortunately CT scans did not show any areas concerning for malignancy.   She also reports continued neuro symptoms. Her last MR brain did suggest potential IIH. I have asked that she continue follow up with her neuro team.    There has been no bleeding to her knowledge: denies epistaxis, gingivitis, hemoptysis, hematemesis, hematuria, melena, excessive bruising, blood donation.   She has had no change in bowel or bladder habits.  There is been no rashes.  She has had no bleeding.    There is been no leg swelling.  Overall, I would say performance status about ECOG 1.   Wt Readings from Last 3 Encounters:  02/15/24 242 lb 12.8 oz (110.1 kg)  12/21/23 230 lb 9.6 oz (104.6 kg)  12/14/23 239 lb (108.4 kg)     Medications:  Allergies as of 02/15/2024       Reactions   Penicillins Other (See Comments)   Causes severe, difficult to treat yeast infections        Medication List        Accurate as of February 15, 2024 10:07 AM. If you have any questions, ask your nurse or doctor.          acetaminophen  325 MG tablet Commonly  known as: TYLENOL  Take 1-2 tablets (325-650 mg total) by mouth every 4 (four) hours as needed for mild pain.   BD SafetyGlide Syringe/Needle 25G X 1 3 ML Misc Generic drug: SYRINGE-NEEDLE (DISP) 3 ML Use for B12 injections   BIOFREEZE EX Apply 1 application  topically daily as needed (On Knees and feet).   Biotin 1000 MCG tablet Take 1,000 mcg by mouth every other day.   celecoxib  200 MG capsule Commonly known as: CELEBREX  TAKE 1 CAPSULE BY MOUTH DAILY AS NEEDED FOR PAIN   cetirizine  10 MG tablet Commonly known as: ZyrTEC  Allergy Take 1 tablet (10 mg total) by mouth daily.   COLLAGEN PO Take by mouth. Liquid collagen with biotin   cyanocobalamin  1000 MCG/ML injection Commonly known as: VITAMIN B12 Inject 1 ml into the muscle once a week for a month.  Then inject 1 ml into the muscle once a month thereafter.   famotidine 40 MG tablet Commonly known as: PEPCID Take 40 mg by mouth 2 (two) times daily.   fluticasone  50 MCG/ACT nasal spray Commonly known as: FLONASE  Place 2 sprays into both nostrils daily.   furosemide  40 MG tablet Commonly known as: LASIX  Take 1 tablet (40 mg total) by mouth daily.   GLUCOSAMINE PO Take 1,000 mg by mouth daily.   hydrochlorothiazide  25 MG tablet Commonly known as: HYDRODIURIL  TAKE  1 TABLET (25 MG TOTAL) BY MOUTH DAILY.   Ibuprofen  200 MG Caps Take 400 mg by mouth daily as needed (For pain).   levothyroxine  88 MCG tablet Commonly known as: SYNTHROID  TAKE 1 TABLET BY MOUTH EVERY DAY   loratadine  10 MG tablet Commonly known as: CLARITIN  TAKE 1 TABLET BY MOUTH EVERY DAY   Magnesium 250 MG Tabs Take 420 mg by mouth daily. For leg cramps   metoprolol  succinate 25 MG 24 hr tablet Commonly known as: Toprol  XL Take 1 tablet (25 mg total) by mouth 2 (two) times daily.   potassium chloride  SA 20 MEQ tablet Commonly known as: Klor-Con  M20 Take 1 tablet (20 mEq total) by mouth daily.   rosuvastatin  5 MG tablet Commonly known as:  Crestor  Take 1 tablet (5 mg total) by mouth at bedtime.   Turmeric 500 MG Caps Take 500 mg by mouth as needed. 3 times weekly, 1 tab po   valsartan  160 MG tablet Commonly known as: DIOVAN  TAKE 1 TABLET BY MOUTH EVERY DAY   Vitamin D3 50 MCG (2000 UT) Tabs Take 2,000 Units by mouth daily.        Allergies:  Allergies  Allergen Reactions   Penicillins Other (See Comments)    Causes severe, difficult to treat yeast infections    Past Medical History, Surgical history, Social history, and Family History were reviewed and updated.  Review of Systems: Review of Systems  Constitutional: Negative.   HENT: Negative.    Eyes: Negative.   Respiratory: Negative.    Cardiovascular: Negative.   Gastrointestinal: Negative.   Genitourinary: Negative.   Musculoskeletal: Negative.   Skin: Negative.   Neurological: Negative.   Endo/Heme/Allergies: Negative.   Psychiatric/Behavioral: Negative.       Physical Exam:  height is 5' 3 (1.6 m) and weight is 242 lb 12.8 oz (110.1 kg). Her oral temperature is 98.1 F (36.7 C). Her blood pressure is 130/62 and her pulse is 59 (abnormal). Her respiration is 18 and oxygen saturation is 100%.   Wt Readings from Last 3 Encounters:  02/15/24 242 lb 12.8 oz (110.1 kg)  12/21/23 230 lb 9.6 oz (104.6 kg)  12/14/23 239 lb (108.4 kg)    Physical Exam Vitals reviewed.  HENT:     Head: Normocephalic and atraumatic.  Eyes:     Pupils: Pupils are equal, round, and reactive to light.  Cardiovascular:     Rate and Rhythm: Normal rate and regular rhythm.     Heart sounds: Normal heart sounds.  Pulmonary:     Effort: Pulmonary effort is normal.     Breath sounds: Normal breath sounds.  Abdominal:     General: Bowel sounds are normal.     Palpations: Abdomen is soft.  Musculoskeletal:        General: No tenderness or deformity. Normal range of motion.     Cervical back: Normal range of motion.  Lymphadenopathy:     Cervical: No cervical  adenopathy.  Skin:    General: Skin is warm and dry.     Findings: No erythema or rash.  Neurological:     Mental Status: She is alert and oriented to person, place, and time.  Psychiatric:        Behavior: Behavior normal.        Thought Content: Thought content normal.        Judgment: Judgment normal.     Lab Results  Component Value Date   WBC 8.6 02/15/2024   HGB  13.3 02/15/2024   HCT 39.8 02/15/2024   MCV 91.7 02/15/2024   PLT 449 (H) 02/15/2024   Lab Results  Component Value Date   FERRITIN 305 12/14/2023   IRON  98 12/14/2023   TIBC 295 12/14/2023   UIBC 197 12/14/2023   IRONPCTSAT 33 (H) 12/14/2023   Lab Results  Component Value Date   RETICCTPCT 2.5 02/15/2024   RBC 4.34 02/15/2024   RBC 4.31 02/15/2024   RETICCTABS 79.2 04/19/2014   No results found for: KPAFRELGTCHN, LAMBDASER, KAPLAMBRATIO No results found for: IGGSERUM, IGA, IGMSERUM No results found for: STEPHANY CARLOTA BENSON MARKEL EARLA JOANNIE DOC VICK, SPEI   Chemistry      Component Value Date/Time   NA 140 12/14/2023 1038   NA 139 10/21/2023 0926   NA 138 04/27/2017 0933   K 3.3 (L) 12/14/2023 1038   K 3.3 (L) 04/27/2017 0933   CL 100 12/14/2023 1038   CO2 29 12/14/2023 1038   CO2 26 04/27/2017 0933   BUN 15 12/14/2023 1038   BUN 14 10/21/2023 0926   BUN 12.2 04/27/2017 0933   CREATININE 0.71 12/14/2023 1038   CREATININE 0.7 04/27/2017 0933   GLU 81 08/29/2019 0000      Component Value Date/Time   CALCIUM  9.4 12/14/2023 1038   CALCIUM  9.1 04/27/2017 0933   ALKPHOS 103 12/14/2023 1038   ALKPHOS 139 04/27/2017 0933   AST 21 12/14/2023 1038   AST 25 04/27/2017 0933   ALT 17 12/14/2023 1038   ALT 19 04/27/2017 0933   BILITOT 0.4 12/14/2023 1038   BILITOT 0.58 04/27/2017 0933     Encounter Diagnoses  Name Primary?   Abnormal x-ray of bone of lower leg Yes   Abnormal MRI scan, bone    Iron  deficiency anemia secondary to inadequate  dietary iron  intake    Thrombocytosis    Impression and Plan: Ms. Heuberger is a very pleasant 58 y.o. African American female with history of mild thrombocytosis as well as intermittent iron  deficiency anemia secondary to malabsorption. She also has chronic mild thrombocytosis which is felt to be secondary to IDA. She has no history of splenomegaly. She does have a history of JAK2 variant.   Today CBC shows a normal WBC of 8.6, normal Hgb of 13.3, platelet count of 449 JAK2 pending  PET scan on 02/18/2024  RTC 2 months APP, labs (CBC w/, CMP, LDH, retic, ferritin, iron )   Lauraine CHRISTELLA Dais, PA-C 9/22/202510:07 AM

## 2024-02-16 ENCOUNTER — Other Ambulatory Visit: Payer: Self-pay | Admitting: Medical Oncology

## 2024-02-16 ENCOUNTER — Encounter: Payer: Self-pay | Admitting: Medical Oncology

## 2024-02-16 DIAGNOSIS — R936 Abnormal findings on diagnostic imaging of limbs: Secondary | ICD-10-CM

## 2024-02-16 DIAGNOSIS — R937 Abnormal findings on diagnostic imaging of other parts of musculoskeletal system: Secondary | ICD-10-CM

## 2024-02-16 NOTE — Progress Notes (Unsigned)
 Jenna Cordella LABOR, MD  Baldwin Rosella L No biopsy.  This is a bone island.  This is a 'do not touch' lesion.       Previous Messages    ----- Message ----- From: Baldwin Rosella CROME Sent: 02/16/2024   1:28 PM EDT To: Rosella CROME Baldwin; Taryn F Rigney, RT; Ir Proc*  Procedure :CT Biopsy  Reason :lytic bone lesion Dx: Abnormal x-ray of bone of lower leg [R93.6 (ICD-10-CM)]; Abnormal MRI scan, bone [R93.7 (ICD-10-CM)]    History :CT CHEST ABDOMEN PELVIS W CONTRAST,IR LUMBAR PUNCTURE ,MR TIBIA FIBULA LEFT W WO CONTRAST  Provider:C[place]ton, Lauraine HERO, PA-C  Provider contact ;  [phone]

## 2024-02-18 ENCOUNTER — Encounter (HOSPITAL_COMMUNITY): Payer: Self-pay

## 2024-02-18 ENCOUNTER — Encounter (HOSPITAL_COMMUNITY)

## 2024-02-24 ENCOUNTER — Other Ambulatory Visit: Payer: Self-pay | Admitting: Internal Medicine

## 2024-02-24 ENCOUNTER — Encounter: Payer: Self-pay | Admitting: Internal Medicine

## 2024-02-24 ENCOUNTER — Other Ambulatory Visit: Payer: Self-pay | Admitting: *Deleted

## 2024-02-24 DIAGNOSIS — R936 Abnormal findings on diagnostic imaging of limbs: Secondary | ICD-10-CM

## 2024-02-24 DIAGNOSIS — R937 Abnormal findings on diagnostic imaging of other parts of musculoskeletal system: Secondary | ICD-10-CM

## 2024-02-24 DIAGNOSIS — M2550 Pain in unspecified joint: Secondary | ICD-10-CM

## 2024-02-24 DIAGNOSIS — D508 Other iron deficiency anemias: Secondary | ICD-10-CM

## 2024-02-24 DIAGNOSIS — E039 Hypothyroidism, unspecified: Secondary | ICD-10-CM

## 2024-02-25 ENCOUNTER — Encounter: Payer: Self-pay | Admitting: *Deleted

## 2024-02-25 ENCOUNTER — Encounter: Payer: Self-pay | Admitting: Internal Medicine

## 2024-02-25 ENCOUNTER — Ambulatory Visit: Admitting: Internal Medicine

## 2024-02-25 VITALS — BP 120/78 | HR 60 | Temp 98.0°F | Wt 241.3 lb

## 2024-02-25 DIAGNOSIS — R935 Abnormal findings on diagnostic imaging of other abdominal regions, including retroperitoneum: Secondary | ICD-10-CM | POA: Diagnosis not present

## 2024-02-25 LAB — MISC LABCORP TEST (SEND OUT): Labcorp test code: 452312

## 2024-02-25 NOTE — Progress Notes (Signed)
 Referral made to Dr. Montie Cable at [facility]. Referral is for Orthopedic Oncology to assess left tibula. Office notes, labs, imaging studies sent. Imaging studies from [facility] also sent. Referral faxed to [phone]. Received faxed confirmation.

## 2024-02-25 NOTE — Progress Notes (Signed)
 Established Patient Office Visit     CC/Reason for Visit: Here to discuss CT abdomen pelvis results  HPI: [name] is a 58 y.o. female who is coming in today for the above mentioned reasons.  She was found to have a left lower leg sclerotic bone lesion by [facility] rheumatology that is currently being worked up.  She is known to have a JAK2 mutation and they are trying to get her in with [facility] orthopedic oncology.  In the meantime she had a CT scan abdomen and pelvis.  Thankfully she did not have any metastatic disease or lymphadenopathy.  She did have signs of hepatic steatosis, mild scarring of the lung bases and sigmoid diverticulosis.  She wanted to discuss these findings with me.   Past Medical/Surgical History: Past Medical History:  Diagnosis Date   A-fib (HCC)    Allergic rhinitis, cause unspecified    Allergy 2016   Anemia, iron  deficiency    Arthritis    Back pain    CFS (chronic fatigue syndrome)    Chest pain    COVID-19 06/15/2020   Diastolic dysfunction    Diverticulosis of colon    Edema, lower extremity    Fatty liver    Gallbladder problem    GERD (gastroesophageal reflux disease)    Goiter    Hiatal hernia    Hypertension    Hypothyroidism    IBS (irritable bowel syndrome)    Joint pain    Morbid obesity (HCC)    Obstructive sleep apnea    OSA on CPAP    mild obstructive sleep apnea with an AHI of 13/h.  On auto CPAP from 4 to 15cm H2O   Osteoarthritis    Pacemaker    SOB (shortness of breath)    Thrombocytosis    Undiagnosed cardiac murmurs    Vitamin B 12 deficiency    Vitamin D  deficiency     Past Surgical History:  Procedure Laterality Date   BARIATRIC SURGERY  06/17/2010   BILATERAL SALPINGECTOMY Bilateral 06/22/2014   Procedure: BILATERAL SALPINGECTOMY;  Surgeon: Ovid All, MD;  Location: [facility];  Service: Gynecology;  Laterality: Bilateral;   CYSTOSCOPY N/A 06/22/2014   Procedure: CYSTOSCOPY;  Surgeon: Ovid All, MD;   Location: [facility];  Service: Gynecology;  Laterality: N/A;   ENDOMETRIAL ABLATION  04/2007   FOOT SURGERY Left 11/24/2017   Dr Magdalen   IR LUMBAR PUNCTURE  01/22/2024   LAPAROSCOPIC ASSISTED VAGINAL HYSTERECTOMY N/A 06/22/2014   Procedure: LAPAROSCOPIC ASSISTED VAGINAL HYSTERECTOMY;  Surgeon: Ovid All, MD;  Location: [facility];  Service: Gynecology;  Laterality: N/A;   LEAD REVISION/REPAIR N/A 12/17/2021   Procedure: LEAD REVISION/REPAIR;  Surgeon: Waddell Danelle ORN, MD;  [facility];  Service: Cardiovascular;  Laterality: N/A;   LIPOMA EXCISION     upper back   PACEMAKER IMPLANT N/A 12/09/2021   Procedure: PACEMAKER IMPLANT;  Surgeon: Waddell Danelle ORN, MD;  [facility]: [facility];  Service: Cardiovascular;  Laterality: N/A;   SPINE SURGERY  02/10/2012   L4-l5 fusion--cohen    Social History:  reports that she has never smoked. She has never been exposed to tobacco smoke. She has never used smokeless tobacco. She reports that she does not drink alcohol and does not use drugs.  Allergies: Allergies  Allergen Reactions   Penicillins Other (See Comments)    Causes severe, difficult to treat yeast infections    Family History:  Family History  Problem Relation  Age of Onset   Breast cancer Mother 57   Arrhythmia Mother    Heart disease Mother    Arthritis Mother    Diabetes Mother    Hyperlipidemia Mother    Hypertension Mother    Thyroid  disease Mother    Anxiety disorder Mother    Hypertension Father    Kidney cancer Father    Colon polyps Father    Lymphoma Father        Diffused Large B-cell Lymphoma   Cancer Father    Obesity Father    Stroke Maternal Grandmother 16   Ovarian cancer Paternal Grandmother    Healthy Daughter    Healthy Daughter    Healthy Daughter    Breast cancer Maternal Aunt 58   Colon cancer Neg Hx      Current Outpatient Medications:    acetaminophen  (TYLENOL ) 325 MG tablet, Take 1-2 tablets (325-650 mg total) by mouth  every 4 (four) hours as needed for mild pain., Disp: , Rfl:    Biotin 1000 MCG tablet, Take 1,000 mcg by mouth every other day., Disp: , Rfl:    celecoxib  (CELEBREX ) 200 MG capsule, TAKE 1 CAPSULE BY MOUTH DAILY AS NEEDED FOR PAIN, Disp: 30 capsule, Rfl: 1   cetirizine  (ZYRTEC  ALLERGY) 10 MG tablet, Take 1 tablet (10 mg total) by mouth daily., Disp: 30 tablet, Rfl: 0   Cholecalciferol  (VITAMIN D3) 50 MCG (2000 UT) TABS, Take 2,000 Units by mouth daily., Disp: , Rfl:    COLLAGEN PO, Take by mouth. Liquid collagen with biotin, Disp: , Rfl:    cyanocobalamin  (VITAMIN B12) 1000 MCG/ML injection, Inject 1 ml into the muscle once a week for a month.  Then inject 1 ml into the muscle once a month thereafter., Disp: 6 mL, Rfl: 11   famotidine (PEPCID) 40 MG tablet, Take 40 mg by mouth 2 (two) times daily., Disp: , Rfl:    fluticasone  (FLONASE ) 50 MCG/ACT nasal spray, Place 2 sprays into both nostrils daily., Disp: 16 g, Rfl: 6   furosemide  (LASIX ) 40 MG tablet, Take 1 tablet (40 mg total) by mouth daily., Disp: 90 tablet, Rfl: 3   Glucosamine HCl (GLUCOSAMINE PO), Take 1,000 mg by mouth daily., Disp: , Rfl:    hydrochlorothiazide  (HYDRODIURIL ) 25 MG tablet, TAKE 1 TABLET (25 MG TOTAL) BY MOUTH DAILY., Disp: 90 tablet, Rfl: 0   Ibuprofen  200 MG CAPS, Take 400 mg by mouth daily as needed (For pain)., Disp: , Rfl:    levothyroxine  (SYNTHROID ) 88 MCG tablet, TAKE 1 TABLET BY MOUTH EVERY DAY, Disp: 30 tablet, Rfl: 2   loratadine  (CLARITIN ) 10 MG tablet, TAKE 1 TABLET BY MOUTH EVERY DAY, Disp: 90 tablet, Rfl: 2   Magnesium 250 MG TABS, Take 420 mg by mouth daily. For leg cramps, Disp: , Rfl:    Menthol , Topical Analgesic, (BIOFREEZE EX), Apply 1 application  topically daily as needed (On Knees and feet)., Disp: , Rfl:    metoprolol  succinate (TOPROL  XL) 25 MG 24 hr tablet, Take 1 tablet (25 mg total) by mouth 2 (two) times daily., Disp: 180 tablet, Rfl: 3   potassium chloride  SA (KLOR-CON  M20) 20 MEQ tablet,  Take 1 tablet (20 mEq total) by mouth daily., Disp: 90 tablet, Rfl: 3   SYRINGE-NEEDLE, DISP, 3 ML (BD SAFETYGLIDE SYRINGE/NEEDLE) 25G X 1 3 ML MISC, Use for B12 injections, Disp: 100 each, Rfl: 3   Turmeric 500 MG CAPS, Take 500 mg by mouth as needed. 3 times weekly, 1 tab  po, Disp: , Rfl:    valsartan  (DIOVAN ) 160 MG tablet, TAKE 1 TABLET BY MOUTH EVERY DAY, Disp: 90 tablet, Rfl: 3  Review of Systems:  Negative unless indicated in HPI.   Physical Exam: Vitals:   02/25/24 0758  BP: 120/78  Pulse: 60  Temp: 98 F (36.7 C)  TempSrc: Oral  SpO2: 97%  Weight: 241 lb 4.8 oz (109.5 kg)    Body mass index is 42.74 kg/m.   Physical Exam Abdominal:     General: There is no distension.     Palpations: Abdomen is soft. There is no mass.     Tenderness: There is no abdominal tenderness.      Impression and Plan:  Abnormal CT of the abdomen   - We have discussed CT results in detail.  Have assured her that none of these findings are of clinical concern.  She was found to have mild hepatic steatosis but her most recent LFTs were within normal limits.  We have spent a significant amount of time today discussing these issues and providing reassurance.  Time spent:31 minutes reviewing chart, interviewing and examining patient and formulating plan of care.     Tully Theophilus Andrews, MD [facility]

## 2024-02-29 ENCOUNTER — Ambulatory Visit: Payer: Self-pay | Admitting: Medical Oncology

## 2024-03-01 NOTE — Progress Notes (Signed)
 Remote PPM Transmission

## 2024-03-03 ENCOUNTER — Ambulatory Visit

## 2024-03-09 ENCOUNTER — Ambulatory Visit: Payer: 59

## 2024-03-09 ENCOUNTER — Ambulatory Visit

## 2024-03-09 ENCOUNTER — Encounter: Payer: Self-pay | Admitting: Cardiology

## 2024-03-09 DIAGNOSIS — I442 Atrioventricular block, complete: Secondary | ICD-10-CM

## 2024-03-10 MED ORDER — METOPROLOL SUCCINATE ER 25 MG PO TB24: 25.0000 mg | ORAL_TABLET | Freq: Two times a day (BID) | ORAL | 2 refills | Status: AC

## 2024-03-11 ENCOUNTER — Telehealth: Payer: Self-pay | Admitting: Internal Medicine

## 2024-03-11 LAB — CUP PACEART REMOTE DEVICE CHECK
Battery Remaining Longevity: 156 mo
Battery Remaining Percentage: 100 %
Brady Statistic RA Percent Paced: 49 %
Brady Statistic RV Percent Paced: 0 %
Date Time Interrogation Session: 20251015054800
Implantable Lead Connection Status: 753985
Implantable Lead Connection Status: 753985
Implantable Lead Implant Date: 20230717
Implantable Lead Implant Date: 20230717
Implantable Lead Location: 753859
Implantable Lead Location: 753860
Implantable Lead Model: 7841
Implantable Lead Model: 7842
Implantable Lead Serial Number: 1204466
Implantable Lead Serial Number: 1298900
Implantable Pulse Generator Implant Date: 20230717
Lead Channel Impedance Value: 552 Ohm
Lead Channel Impedance Value: 761 Ohm
Lead Channel Pacing Threshold Amplitude: 0.6 V
Lead Channel Pacing Threshold Pulse Width: 0.4 ms
Lead Channel Setting Pacing Amplitude: 2.5 V
Lead Channel Setting Pacing Amplitude: 3.3 V
Lead Channel Setting Pacing Pulse Width: 1 ms
Lead Channel Setting Sensing Sensitivity: 1 mV
Pulse Gen Serial Number: 603605
Zone Setting Status: 755011

## 2024-03-11 NOTE — Telephone Encounter (Signed)
 Patient says she got a notification that transmission wasn't received. She tried pressing the button on monitor, but it started flashing orange light. She would like a call back to discuss.

## 2024-03-11 NOTE — Telephone Encounter (Signed)
 Patient returned call to clinic after connecting monitor and asked if the transmission had arrived. Confirmed monitor is connected and transmission arrived. Informed patient to leave monitor plugged in near bedside.   Will continue to monitor and update accordingly.

## 2024-03-11 NOTE — Telephone Encounter (Signed)
 Spoke w/ patient regarding missed transmission & orange flashing light on monitor at bedside. Attempted to assist patient w/ sending manual transmission while on the phone. Unsuccessful in sending manual transmission w/ orange flashing light still prevalent.   [employer] number given to patient to assist in monitor troubleshooting. Device clinic number given if unable to successfully connect monitor or has any further questions or concerns.   Will continue to monitor and update accordingly. Patient appreciative of call.

## 2024-03-13 ENCOUNTER — Ambulatory Visit: Payer: Self-pay | Admitting: Internal Medicine

## 2024-03-14 NOTE — Progress Notes (Signed)
 Remote PPM Transmission

## 2024-03-17 ENCOUNTER — Ambulatory Visit

## 2024-03-24 ENCOUNTER — Encounter

## 2024-03-24 ENCOUNTER — Telehealth: Payer: Self-pay

## 2024-03-24 DIAGNOSIS — R5383 Other fatigue: Secondary | ICD-10-CM

## 2024-03-24 NOTE — Telephone Encounter (Signed)
 Called pt to go over Dr. Ginette request for blood work and to enquire if she is using her CPAP. Lab orders placed.

## 2024-03-28 NOTE — Progress Notes (Signed)
 Patient Name: [name] MR#: [id number]   Subjective:   HPI (03/28/2024):  History of Present Illness The patient is a 58 year old female who presents for evaluation of right shoulder pain.  She reports experiencing intermittent right shoulder pain, which has been progressively worsening throughout the year. The pain is severe enough to disrupt her sleep and is present even during the day, although not at its peak intensity. She describes the pain as throbbing, particularly when she lies on her right side. Additionally, she notes difficulty in reaching behind her and lifting objects. She has no prior history of shoulder issues or surgical interventions. She has a pacemaker on the right side. She has been managing the pain with Celebrex , which provides some relief.     BMI Readings from Last 1 Encounters:  03/03/24 43.75 kg/m   Objective:  Physical Exam:  Right shoulder: Skin intact, no lesions No significant atrophy or asymmetry PROM R L  Flexion 160 160  ER 60 60  IR T10 T10  Strength R L  Abd 5 5  ER 5 5  IR 5 5  Positive: Neer and Hawkins impingement. Pain with empty can test. Biceps TTP. Negative: AC TTP. ER lag. SILT Ax/rad/med/ulnar + motor grip/EPL/IO Hand WWP, palpable radial pulse  Diagnostic Studies:   X-rays of the right shoulder reviewed and interpreted, demonstrating mild degenerative changes in the glenohumeral joint with some early joint space narrowing, moderate degenerative change in the [facility] joint with some spurring.  No superior humeral head migration  Assessment:  58 y.o. female with right shoulder rotator cuff based pain  Medical Decision Making:  We discussed the spectrum of rotator cuff based pain as well as treatment options.  She is interested in proceeding with a home exercise program as well as subacromial steroid injection today.  Plan of care:   Right shoulder subacromial steroid injection AAOS home exercise program  Follow up: 6  weeks if symptoms are persisting  Large joint arthrocentesis: R subacromial bursa on 03/28/2024 8:50 AMIndications: pain Details: 21 G needle, posterior approach Medications: 8 mL BUPivacaine  HCl 0.25 % (2.5 mg/mL); 40 mg triamcinolone acetonide 40 mg/mL Outcome: tolerated well, no immediate complications Consent was given by the patient. Immediately prior to procedure a time out was called to verify the correct patient, procedure, equipment, support staff and site/side marked as required. Patient was prepped and draped in the usual sterile fashion.

## 2024-03-29 ENCOUNTER — Ambulatory Visit: Admitting: Internal Medicine

## 2024-03-29 ENCOUNTER — Encounter: Payer: Self-pay | Admitting: Internal Medicine

## 2024-03-29 VITALS — BP 110/74 | HR 64 | Temp 98.1°F | Wt 244.0 lb

## 2024-03-29 DIAGNOSIS — E038 Other specified hypothyroidism: Secondary | ICD-10-CM | POA: Diagnosis not present

## 2024-03-29 DIAGNOSIS — E559 Vitamin D deficiency, unspecified: Secondary | ICD-10-CM | POA: Diagnosis not present

## 2024-03-29 DIAGNOSIS — I10 Essential (primary) hypertension: Secondary | ICD-10-CM | POA: Diagnosis not present

## 2024-03-29 DIAGNOSIS — E538 Deficiency of other specified B group vitamins: Secondary | ICD-10-CM

## 2024-03-29 NOTE — Assessment & Plan Note (Signed)
 Check TSH today.  On 88 mcg of levothyroxine .

## 2024-03-29 NOTE — Progress Notes (Signed)
 Established Patient Office Visit     CC/Reason for Visit: Follow-up chronic conditions  HPI: [name] is a 58 y.o. female who is coming in today for the above mentioned reasons. Past Medical History is significant for: Morbid obesity, hypertension, hypothyroidism, essential thrombocytosis, iron  deficiency anemia, OSA with CPAP, vitamin D  and B12 deficiencies.  She does had a right shoulder injection yesterday.  Is also being followed for left ankle pain.   Past Medical/Surgical History: Past Medical History:  Diagnosis Date   A-fib (HCC)    Allergic rhinitis, cause unspecified    Allergy 2016   Anemia, iron  deficiency    Arthritis    Back pain    CFS (chronic fatigue syndrome)    Chest pain    COVID-19 06/15/2020   Diastolic dysfunction    Diverticulosis of colon    Edema, lower extremity    Fatty liver    Gallbladder problem    GERD (gastroesophageal reflux disease)    Goiter    Hiatal hernia    Hypertension    Hypothyroidism    IBS (irritable bowel syndrome)    Joint pain    Morbid obesity (HCC)    Obstructive sleep apnea    OSA on CPAP    mild obstructive sleep apnea with an AHI of 13/h.  On auto CPAP from 4 to 15cm H2O   Osteoarthritis    Pacemaker    SOB (shortness of breath)    Thrombocytosis    Undiagnosed cardiac murmurs    Vitamin B 12 deficiency    Vitamin D  deficiency     Past Surgical History:  Procedure Laterality Date   BARIATRIC SURGERY  06/17/2010   BILATERAL SALPINGECTOMY Bilateral 06/22/2014   Procedure: BILATERAL SALPINGECTOMY;  Surgeon: Ovid All, MD;  Location: [facility];  Service: Gynecology;  Laterality: Bilateral;   CYSTOSCOPY N/A 06/22/2014   Procedure: CYSTOSCOPY;  Surgeon: Ovid All, MD;  Location: [facility];  Service: Gynecology;  Laterality: N/A;   ENDOMETRIAL ABLATION  04/2007   FOOT SURGERY Left 11/24/2017   Dr Magdalen   IR LUMBAR PUNCTURE  01/22/2024   LAPAROSCOPIC ASSISTED VAGINAL HYSTERECTOMY N/A 06/22/2014    Procedure: LAPAROSCOPIC ASSISTED VAGINAL HYSTERECTOMY;  Surgeon: Ovid All, MD;  Location: [facility];  Service: Gynecology;  Laterality: N/A;   LEAD REVISION/REPAIR N/A 12/17/2021   Procedure: LEAD REVISION/REPAIR;  Surgeon: Waddell Danelle ORN, MD;  [facility]: [facility];  Service: Cardiovascular;  Laterality: N/A;   LIPOMA EXCISION     upper back   PACEMAKER IMPLANT N/A 12/09/2021   Procedure: PACEMAKER IMPLANT;  Surgeon: Waddell Danelle ORN, MD;  [facility]: [facility];  Service: Cardiovascular;  Laterality: N/A;   SPINE SURGERY  02/10/2012   L4-l5 fusion--cohen    Social History:  reports that she has never smoked. She has never been exposed to tobacco smoke. She has never used smokeless tobacco. She reports that she does not drink alcohol and does not use drugs.  Allergies: Allergies  Allergen Reactions   Penicillins Other (See Comments)    Causes severe, difficult to treat yeast infections    Family History:  Family History  Problem Relation Age of Onset   Breast cancer Mother 58   Arrhythmia Mother    Heart disease Mother    Arthritis Mother    Diabetes Mother    Hyperlipidemia Mother    Hypertension Mother    Thyroid  disease Mother    Anxiety disorder Mother    Hypertension  Father    Kidney cancer Father    Colon polyps Father    Lymphoma Father        Diffused Large B-cell Lymphoma   Cancer Father    Obesity Father    Stroke Maternal Grandmother 70   Ovarian cancer Paternal Grandmother    Healthy Daughter    Healthy Daughter    Healthy Daughter    Breast cancer Maternal Aunt 58   Colon cancer Neg Hx      Current Outpatient Medications:    acetaminophen  (TYLENOL ) 325 MG tablet, Take 1-2 tablets (325-650 mg total) by mouth every 4 (four) hours as needed for mild pain., Disp: , Rfl:    Biotin 1000 MCG tablet, Take 1,000 mcg by mouth every other day., Disp: , Rfl:    celecoxib  (CELEBREX ) 200 MG capsule, TAKE 1 CAPSULE BY MOUTH DAILY AS NEEDED FOR  PAIN, Disp: 30 capsule, Rfl: 1   cetirizine  (ZYRTEC  ALLERGY) 10 MG tablet, Take 1 tablet (10 mg total) by mouth daily., Disp: 30 tablet, Rfl: 0   Cholecalciferol  (VITAMIN D3) 50 MCG (2000 UT) TABS, Take 2,000 Units by mouth daily., Disp: , Rfl:    COLLAGEN PO, Take by mouth. Liquid collagen with biotin, Disp: , Rfl:    cyanocobalamin  (VITAMIN B12) 1000 MCG/ML injection, Inject 1 ml into the muscle once a week for a month.  Then inject 1 ml into the muscle once a month thereafter., Disp: 6 mL, Rfl: 11   famotidine (PEPCID) 40 MG tablet, Take 40 mg by mouth 2 (two) times daily., Disp: , Rfl:    fluticasone  (FLONASE ) 50 MCG/ACT nasal spray, Place 2 sprays into both nostrils daily., Disp: 16 g, Rfl: 6   furosemide  (LASIX ) 40 MG tablet, Take 1 tablet (40 mg total) by mouth daily., Disp: 90 tablet, Rfl: 3   Glucosamine HCl (GLUCOSAMINE PO), Take 1,000 mg by mouth daily., Disp: , Rfl:    hydrochlorothiazide  (HYDRODIURIL ) 25 MG tablet, TAKE 1 TABLET (25 MG TOTAL) BY MOUTH DAILY., Disp: 90 tablet, Rfl: 0   Ibuprofen  200 MG CAPS, Take 400 mg by mouth daily as needed (For pain)., Disp: , Rfl:    levothyroxine  (SYNTHROID ) 88 MCG tablet, TAKE 1 TABLET BY MOUTH EVERY DAY, Disp: 30 tablet, Rfl: 2   loratadine  (CLARITIN ) 10 MG tablet, TAKE 1 TABLET BY MOUTH EVERY DAY, Disp: 90 tablet, Rfl: 2   Magnesium 250 MG TABS, Take 420 mg by mouth daily. For leg cramps, Disp: , Rfl:    Menthol , Topical Analgesic, (BIOFREEZE EX), Apply 1 application  topically daily as needed (On Knees and feet)., Disp: , Rfl:    metoprolol  succinate (TOPROL  XL) 25 MG 24 hr tablet, Take 1 tablet (25 mg total) by mouth 2 (two) times daily., Disp: 180 tablet, Rfl: 2   potassium chloride  SA (KLOR-CON  M20) 20 MEQ tablet, Take 1 tablet (20 mEq total) by mouth daily., Disp: 90 tablet, Rfl: 3   SYRINGE-NEEDLE, DISP, 3 ML (BD SAFETYGLIDE SYRINGE/NEEDLE) 25G X 1 3 ML MISC, Use for B12 injections, Disp: 100 each, Rfl: 3   Turmeric 500 MG CAPS, Take  500 mg by mouth as needed. 3 times weekly, 1 tab po, Disp: , Rfl:    valsartan  (DIOVAN ) 160 MG tablet, TAKE 1 TABLET BY MOUTH EVERY DAY, Disp: 90 tablet, Rfl: 3  Review of Systems:  Negative unless indicated in HPI.   Physical Exam: Vitals:   03/29/24 0951  BP: 110/74  Pulse: 64  Temp: 98.1 F (36.7 C)  TempSrc: Oral  SpO2: 97%  Weight: 244 lb (110.7 kg)    Body mass index is 43.22 kg/m.   Physical Exam Vitals reviewed.  Constitutional:      Appearance: Normal appearance. She is obese.  HENT:     Head: Normocephalic and atraumatic.  Eyes:     Conjunctiva/sclera: Conjunctivae normal.  Cardiovascular:     Rate and Rhythm: Normal rate and regular rhythm.  Pulmonary:     Effort: Pulmonary effort is normal.     Breath sounds: Normal breath sounds.  Skin:    General: Skin is warm and dry.  Neurological:     General: No focal deficit present.     Mental Status: She is alert and oriented to person, place, and time.  Psychiatric:        Mood and Affect: Mood normal.        Behavior: Behavior normal.        Thought Content: Thought content normal.        Judgment: Judgment normal.      Impression and Plan:  Other specified hypothyroidism Assessment & Plan: Check TSH today.  On 88 mcg of levothyroxine .  Orders: -     TSH; Future  Essential hypertension  Vitamin D  deficiency Assessment & Plan: Check levels today.  Orders: -     VITAMIN D  25 Hydroxy (Vit-D Deficiency, Fractures); Future  Vitamin B12 deficiency Assessment & Plan: Check levels today.  Orders: -     Vitamin B12; Future     Time spent:31 minutes reviewing chart, interviewing and examining patient and formulating plan of care.     Tully Theophilus Andrews, MD [facility]

## 2024-03-29 NOTE — Assessment & Plan Note (Signed)
 Check levels today.

## 2024-03-30 LAB — VITAMIN D 25 HYDROXY (VIT D DEFICIENCY, FRACTURES): VITD: 53.97 ng/mL (ref 30.00–100.00)

## 2024-03-30 LAB — VITAMIN B12: Vitamin B-12: >1500 pg/mL — ABNORMAL HIGH (ref 211–911)

## 2024-03-30 LAB — TSH: TSH: 0.97 u[IU]/mL (ref 0.35–5.50)

## 2024-03-31 ENCOUNTER — Ambulatory Visit: Payer: Self-pay | Admitting: Internal Medicine

## 2024-04-01 NOTE — Telephone Encounter (Signed)
 Called pt to go over lab orders. No answer. Called home number and spouse's number. No answer.

## 2024-04-07 ENCOUNTER — Ambulatory Visit: Admitting: Cardiology

## 2024-04-13 ENCOUNTER — Encounter: Payer: Self-pay | Admitting: Cardiology

## 2024-04-14 ENCOUNTER — Inpatient Hospital Stay: Attending: Medical Oncology

## 2024-04-14 ENCOUNTER — Inpatient Hospital Stay: Admitting: Medical Oncology

## 2024-04-14 VITALS — BP 136/60 | HR 60 | Temp 98.6°F | Resp 17 | Wt 241.0 lb

## 2024-04-14 DIAGNOSIS — D75839 Thrombocytosis, unspecified: Secondary | ICD-10-CM | POA: Insufficient documentation

## 2024-04-14 DIAGNOSIS — Z807 Family history of other malignant neoplasms of lymphoid, hematopoietic and related tissues: Secondary | ICD-10-CM | POA: Diagnosis not present

## 2024-04-14 DIAGNOSIS — D509 Iron deficiency anemia, unspecified: Secondary | ICD-10-CM | POA: Insufficient documentation

## 2024-04-14 DIAGNOSIS — R936 Abnormal findings on diagnostic imaging of limbs: Secondary | ICD-10-CM

## 2024-04-14 DIAGNOSIS — D508 Other iron deficiency anemias: Secondary | ICD-10-CM | POA: Diagnosis not present

## 2024-04-14 DIAGNOSIS — R937 Abnormal findings on diagnostic imaging of other parts of musculoskeletal system: Secondary | ICD-10-CM

## 2024-04-14 LAB — CBC WITH DIFFERENTIAL (CANCER CENTER ONLY)
Abs Immature Granulocytes: 0.03 K/uL (ref 0.00–0.07)
Basophils Absolute: 0.1 K/uL (ref 0.0–0.1)
Basophils Relative: 1 %
Eosinophils Absolute: 0.1 K/uL (ref 0.0–0.5)
Eosinophils Relative: 1 %
HCT: 41.2 % (ref 36.0–46.0)
Hemoglobin: 13.7 g/dL (ref 12.0–15.0)
Immature Granulocytes: 0 %
Lymphocytes Relative: 44 %
Lymphs Abs: 4 K/uL (ref 0.7–4.0)
MCH: 30.3 pg (ref 26.0–34.0)
MCHC: 33.3 g/dL (ref 30.0–36.0)
MCV: 91.2 fL (ref 80.0–100.0)
Monocytes Absolute: 0.5 K/uL (ref 0.1–1.0)
Monocytes Relative: 6 %
Neutro Abs: 4.4 K/uL (ref 1.7–7.7)
Neutrophils Relative %: 48 %
Platelet Count: 420 K/uL — ABNORMAL HIGH (ref 150–400)
RBC: 4.52 MIL/uL (ref 3.87–5.11)
RDW: 13.2 % (ref 11.5–15.5)
WBC Count: 9.1 K/uL (ref 4.0–10.5)
nRBC: 0 % (ref 0.0–0.2)

## 2024-04-14 LAB — RETIC PANEL
Immature Retic Fract: 7.9 % (ref 2.3–15.9)
RBC.: 4.5 MIL/uL (ref 3.87–5.11)
Retic Count, Absolute: 90 K/uL (ref 19.0–186.0)
Retic Ct Pct: 2 % (ref 0.4–3.1)
Reticulocyte Hemoglobin: 34.1 pg (ref >27.9)

## 2024-04-14 LAB — CMP (CANCER CENTER ONLY)
ALT: 27 U/L (ref 0–44)
AST: 32 U/L (ref 15–41)
Albumin: 4.4 g/dL (ref 3.5–5.0)
Alkaline Phosphatase: 135 U/L — ABNORMAL HIGH (ref 38–126)
Anion gap: 13 (ref 5–15)
BUN: 16 mg/dL (ref 6–20)
CO2: 28 mmol/L (ref 22–32)
Calcium: 9.4 mg/dL (ref 8.9–10.3)
Chloride: 98 mmol/L (ref 98–111)
Creatinine: 0.77 mg/dL (ref 0.44–1.00)
GFR, Estimated: >60 mL/min (ref >60)
Glucose, Bld: 96 mg/dL (ref 70–99)
Potassium: 3.9 mmol/L (ref 3.5–5.1)
Sodium: 139 mmol/L (ref 135–145)
Total Bilirubin: 0.4 mg/dL (ref 0.0–1.2)
Total Protein: 7.6 g/dL (ref 6.5–8.1)

## 2024-04-14 LAB — IRON AND IRON BINDING CAPACITY (CC-WL,HP ONLY)
Iron: 90 ug/dL (ref 28–170)
Saturation Ratios: 28 % (ref 10.4–31.8)
TIBC: 316 ug/dL (ref 250–450)
UIBC: 226 ug/dL

## 2024-04-14 LAB — LACTATE DEHYDROGENASE: LDH: 287 U/L — ABNORMAL HIGH (ref 105–235)

## 2024-04-14 LAB — FERRITIN: Ferritin: 313 ng/mL — ABNORMAL HIGH (ref 11–307)

## 2024-04-14 NOTE — Progress Notes (Signed)
 Hematology and Oncology Follow Up Visit  [name] [id number] 24-Apr-1966 58 y.o. 04/14/2024   Principle Diagnosis:  Thrombocytosis  - JAK2 (-)/ DNMT3A (+) Family history of lymphoma (father) Iron  deficiency anemia secondary to malabsorption with bariatric surgery (2012)    Current Therapy:        Observation IV iron  as indicated   Feraheme  given on 04/27/2018 Venofer  200 mg- 09/14/2023   Interim History:  [name] is here today for follow-up.   She states that she has been doing fair. She recently saw rheumatology at [facility] who did some imaging and testing. She had an x ray of her right ankle which showed a small nonspecific sclerotic lesion in the distal tibia. MRI confirmed area of concern and recommended additional imaging. We have since completed a chest CT C/A/P per insurance request and she has an upcoming PET scan planned. Fortunately CT scans did not show any areas concerning for malignancy.   Today she states that she has been ok. She is working with cardiology and pulmonology to help with some SOB she is having chronically. She sees them on the 10th of Dec. Symptoms stable.   There has been no bleeding to her knowledge: denies epistaxis, gingivitis, hemoptysis, hematemesis, hematuria, melena, excessive bruising, blood donation.   She has had no change in bowel or bladder habits.  There is been no rashes.  She has had no bleeding.    There is been no leg swelling.  Overall, I would say performance status about ECOG 1.   Wt Readings from Last 3 Encounters:  04/14/24 241 lb (109.3 kg)  03/29/24 244 lb (110.7 kg)  02/25/24 241 lb 4.8 oz (109.5 kg)     Medications:  Allergies as of 04/14/2024       Reactions   Penicillins Other (See Comments)   Causes severe, difficult to treat yeast infections        Medication List        Accurate as of April 14, 2024 10:06 AM. If you have any questions, ask your nurse or doctor.           acetaminophen  325 MG tablet Commonly known as: TYLENOL  Take 1-2 tablets (325-650 mg total) by mouth every 4 (four) hours as needed for mild pain.   BD SafetyGlide Syringe/Needle 25G X 1 3 ML Misc Generic drug: SYRINGE-NEEDLE (DISP) 3 ML Use for B12 injections   BIOFREEZE EX Apply 1 application  topically daily as needed (On Knees and feet).   Biotin 1000 MCG tablet Take 1,000 mcg by mouth every other day.   celecoxib  200 MG capsule Commonly known as: CELEBREX  TAKE 1 CAPSULE BY MOUTH DAILY AS NEEDED FOR PAIN   cetirizine  10 MG tablet Commonly known as: ZyrTEC  Allergy Take 1 tablet (10 mg total) by mouth daily.   COLLAGEN PO Take by mouth. Liquid collagen with biotin   cyanocobalamin  1000 MCG/ML injection Commonly known as: VITAMIN B12 Inject 1 ml into the muscle once a week for a month.  Then inject 1 ml into the muscle once a month thereafter.   famotidine 40 MG tablet Commonly known as: PEPCID Take 40 mg by mouth 2 (two) times daily.   fluticasone  50 MCG/ACT nasal spray Commonly known as: FLONASE  Place 2 sprays into both nostrils daily.   furosemide  40 MG tablet Commonly known as: LASIX  Take 1 tablet (40 mg total) by mouth daily.   GLUCOSAMINE PO Take 1,000 mg by mouth daily.   hydrochlorothiazide  25 MG tablet  Commonly known as: HYDRODIURIL  TAKE 1 TABLET (25 MG TOTAL) BY MOUTH DAILY.   Ibuprofen  200 MG Caps Take 400 mg by mouth daily as needed (For pain).   levothyroxine  88 MCG tablet Commonly known as: SYNTHROID  TAKE 1 TABLET BY MOUTH EVERY DAY   loratadine  10 MG tablet Commonly known as: CLARITIN  TAKE 1 TABLET BY MOUTH EVERY DAY   Magnesium 250 MG Tabs Take 420 mg by mouth daily. For leg cramps   metoprolol  succinate 25 MG 24 hr tablet Commonly known as: Toprol  XL Take 1 tablet (25 mg total) by mouth 2 (two) times daily.   potassium chloride  SA 20 MEQ tablet Commonly known as: Klor-Con  M20 Take 1 tablet (20 mEq total) by mouth daily.    Turmeric 500 MG Caps Take 500 mg by mouth as needed. 3 times weekly, 1 tab po   valsartan  160 MG tablet Commonly known as: DIOVAN  TAKE 1 TABLET BY MOUTH EVERY DAY   Vitamin D3 50 MCG (2000 UT) Tabs Take 2,000 Units by mouth daily.        Allergies:  Allergies  Allergen Reactions   Penicillins Other (See Comments)    Causes severe, difficult to treat yeast infections    Past Medical History, Surgical history, Social history, and Family History were reviewed and updated.  Review of Systems: Review of Systems  Constitutional: Negative.   HENT: Negative.    Eyes: Negative.   Respiratory: Negative.    Cardiovascular: Negative.   Gastrointestinal: Negative.   Genitourinary: Negative.   Musculoskeletal: Negative.   Skin: Negative.   Neurological: Negative.   Endo/Heme/Allergies: Negative.   Psychiatric/Behavioral: Negative.       Physical Exam:  weight is 241 lb (109.3 kg). Her oral temperature is 98.6 F (37 C). Her blood pressure is 136/60 and her pulse is 60. Her respiration is 17 and oxygen saturation is 100%.   Wt Readings from Last 3 Encounters:  04/14/24 241 lb (109.3 kg)  03/29/24 244 lb (110.7 kg)  02/25/24 241 lb 4.8 oz (109.5 kg)    Physical Exam Vitals reviewed.  HENT:     Head: Normocephalic and atraumatic.  Eyes:     Pupils: Pupils are equal, round, and reactive to light.  Cardiovascular:     Rate and Rhythm: Normal rate and regular rhythm.     Heart sounds: Normal heart sounds.  Pulmonary:     Effort: Pulmonary effort is normal.     Breath sounds: Normal breath sounds.  Abdominal:     General: Bowel sounds are normal.     Palpations: Abdomen is soft.  Musculoskeletal:        General: No tenderness or deformity. Normal range of motion.     Cervical back: Normal range of motion.  Lymphadenopathy:     Cervical: No cervical adenopathy.  Skin:    General: Skin is warm and dry.     Findings: No erythema or rash.  Neurological:     Mental  Status: She is alert and oriented to person, place, and time.  Psychiatric:        Behavior: Behavior normal.        Thought Content: Thought content normal.        Judgment: Judgment normal.     Lab Results  Component Value Date   WBC 9.1 04/14/2024   HGB 13.7 04/14/2024   HCT 41.2 04/14/2024   MCV 91.2 04/14/2024   PLT 420 (H) 04/14/2024   Lab Results  Component Value Date  FERRITIN 322 (H) 02/15/2024   IRON  86 02/15/2024   TIBC 330 02/15/2024   UIBC 244 02/15/2024   IRONPCTSAT 26 02/15/2024   Lab Results  Component Value Date   RETICCTPCT 2.0 04/14/2024   RBC 4.50 04/14/2024   RETICCTABS 79.2 04/19/2014   No results found for: KPAFRELGTCHN, LAMBDASER, KAPLAMBRATIO No results found for: IGGSERUM, IGA, IGMSERUM No results found for: STEPHANY CARLOTA BENSON MARKEL EARLA JOANNIE DOC VICK, SPEI   Chemistry      Component Value Date/Time   NA 139 04/14/2024 0925   NA 139 10/21/2023 0926   NA 138 04/27/2017 0933   K 3.9 04/14/2024 0925   K 3.3 (L) 04/27/2017 0933   CL 98 04/14/2024 0925   CO2 28 04/14/2024 0925   CO2 26 04/27/2017 0933   BUN 16 04/14/2024 0925   BUN 14 10/21/2023 0926   BUN 12.2 04/27/2017 0933   CREATININE 0.77 04/14/2024 0925   CREATININE 0.7 04/27/2017 0933   GLU 81 08/29/2019 0000      Component Value Date/Time   CALCIUM  9.4 04/14/2024 0925   CALCIUM  9.1 04/27/2017 0933   ALKPHOS 135 (H) 04/14/2024 0925   ALKPHOS 139 04/27/2017 0933   AST 32 04/14/2024 0925   AST 25 04/27/2017 0933   ALT 27 04/14/2024 0925   ALT 19 04/27/2017 0933   BILITOT 0.4 04/14/2024 0925   BILITOT 0.58 04/27/2017 0933     No diagnosis found.  Impression and Plan: Ms. Prisco is a very pleasant 58 y.o. African American female with history of mild thrombocytosis as well as intermittent iron  deficiency anemia secondary to malabsorption. She also has chronic mild thrombocytosis which is felt to be secondary to IDA. She has  no history of splenomegaly. She does have a history of JAK2 variant.   Today CBC shows a normal WBC of 9.1, normal Hgb of 13.7, platelet count of 420   RTC 3 months APP, labs (CBC w/, CMP, LDH, retic, ferritin, iron )   Lauraine CHRISTELLA Dais, PA-C 11/20/202510:06 AM

## 2024-04-24 ENCOUNTER — Other Ambulatory Visit: Payer: Self-pay | Admitting: Internal Medicine

## 2024-04-24 DIAGNOSIS — M2550 Pain in unspecified joint: Secondary | ICD-10-CM

## 2024-04-26 ENCOUNTER — Other Ambulatory Visit: Payer: Self-pay

## 2024-04-26 ENCOUNTER — Encounter: Payer: Self-pay | Admitting: *Deleted

## 2024-04-26 DIAGNOSIS — R5383 Other fatigue: Secondary | ICD-10-CM

## 2024-04-26 NOTE — Telephone Encounter (Signed)
 Called pt again to go over order labs from Dr. Sheena. Spoke with pt, she is aware the ordered have been placed. She will have them drawn tomorrow.

## 2024-04-28 ENCOUNTER — Ambulatory Visit: Attending: Cardiology | Admitting: Cardiology

## 2024-04-28 ENCOUNTER — Encounter: Payer: Self-pay | Admitting: Family

## 2024-04-28 ENCOUNTER — Other Ambulatory Visit (HOSPITAL_COMMUNITY): Payer: Self-pay

## 2024-04-28 ENCOUNTER — Encounter: Payer: Self-pay | Admitting: Cardiology

## 2024-04-28 VITALS — BP 124/70 | HR 60 | Ht 64.0 in | Wt 244.4 lb

## 2024-04-28 DIAGNOSIS — R0609 Other forms of dyspnea: Secondary | ICD-10-CM

## 2024-04-28 DIAGNOSIS — Z950 Presence of cardiac pacemaker: Secondary | ICD-10-CM

## 2024-04-28 DIAGNOSIS — R079 Chest pain, unspecified: Secondary | ICD-10-CM

## 2024-04-28 DIAGNOSIS — G4733 Obstructive sleep apnea (adult) (pediatric): Secondary | ICD-10-CM | POA: Diagnosis not present

## 2024-04-28 DIAGNOSIS — K449 Diaphragmatic hernia without obstruction or gangrene: Secondary | ICD-10-CM

## 2024-04-28 DIAGNOSIS — I493 Ventricular premature depolarization: Secondary | ICD-10-CM

## 2024-04-28 DIAGNOSIS — Z01818 Encounter for other preprocedural examination: Secondary | ICD-10-CM | POA: Diagnosis not present

## 2024-04-28 DIAGNOSIS — I442 Atrioventricular block, complete: Secondary | ICD-10-CM

## 2024-04-28 LAB — COMPREHENSIVE METABOLIC PANEL WITH GFR
ALT: 23 IU/L (ref 0–32)
AST: 27 IU/L (ref 0–40)
Albumin: 4.3 g/dL (ref 3.8–4.9)
Alkaline Phosphatase: 144 IU/L — ABNORMAL HIGH (ref 49–135)
BUN/Creatinine Ratio: 20 (ref 9–23)
BUN: 13 mg/dL (ref 6–24)
Bilirubin Total: 0.3 mg/dL (ref 0.0–1.2)
CO2: 21 mmol/L (ref 20–29)
Calcium: 9.6 mg/dL (ref 8.7–10.2)
Chloride: 99 mmol/L (ref 96–106)
Creatinine, Ser: 0.65 mg/dL (ref 0.57–1.00)
Globulin, Total: 2.6 g/dL (ref 1.5–4.5)
Glucose: 87 mg/dL (ref 70–99)
Potassium: 4.5 mmol/L (ref 3.5–5.2)
Sodium: 140 mmol/L (ref 134–144)
Total Protein: 6.9 g/dL (ref 6.0–8.5)
eGFR: 102 mL/min/1.73 (ref >59)

## 2024-04-28 LAB — TSH+T4F+T3FREE
Free T4: 1.61 ng/dL (ref 0.82–1.77)
T3, Free: 3.1 pg/mL (ref 2.0–4.4)
TSH: 1.93 u[IU]/mL (ref 0.450–4.500)

## 2024-04-28 MED ORDER — NITROGLYCERIN 0.4 MG SL SUBL
0.4000 mg | SUBLINGUAL_TABLET | SUBLINGUAL | 3 refills | Status: AC | PRN
  Filled 2024-04-28: qty 25, 15d supply, fill #0

## 2024-04-28 MED ORDER — AMLODIPINE BESYLATE 2.5 MG PO TABS
2.5000 mg | ORAL_TABLET | Freq: Every day | ORAL | 3 refills | Status: AC
  Filled 2024-04-28: qty 90, 90d supply, fill #0

## 2024-04-28 NOTE — Progress Notes (Signed)
 Cardiology Office Note:    Date:  04/28/2024   ID:  [name], DOB August 12, 1965, MRN [id number]  PCP:  Theophilus Andrews, Tully GRADE, MD  Cardiologist:  Dub Huntsman, DO  Electrophysiologist:  None   Referring MD: Theophilus Andrews, Estel*    I am ok  History of Present Illness:    [name] is a 58 y.o. female with a hx of mixed connective tissue disorder, intermittent AV block who underwent PPM insertion complicated by lead migration and asymptomatic perforation-she underwent insertion of a new lead, PAF which was recently found on her ppm interrogation, COPD, mild thrombocytosis, iron  deficiency anemia, secondary to absorption after bariatric surgery in 2012, concern for mixed connective tissue disorder, sleep apnea no longer on CPAP, hypertension, irritable bowel syndrome and obesity.    [name] was brought in earlier than her scheduled appt due to worsening chest pain and debilitating shortness of breath. She reports intermittent chest discomfort as slow onset burning and pressure like sensation on exertion. At time there is radiation to shoulder but not often. It comes and goes. Nothing makes in better it better or worse. The main most bothersome and debilitating in the fact that the shortness of breath is getting worse. It first started on exertion but recently is has gotten worse on limited exertion and now exist at rest.      Past Medical History:  Diagnosis Date   A-fib [facility])    Allergic rhinitis, cause unspecified    Allergy 2016   Anemia, iron  deficiency    Arthritis    Back pain    CFS (chronic fatigue syndrome)    Chest pain    COVID-19 06/15/2020   Diastolic dysfunction    Diverticulosis of colon    Edema, lower extremity    Fatty liver    Gallbladder problem    GERD (gastroesophageal reflux disease)    Goiter    Hiatal hernia    Hypertension    Hypothyroidism    IBS (irritable bowel syndrome)    Joint pain    Morbid obesity (HCC)     Obstructive sleep apnea    OSA on CPAP    mild obstructive sleep apnea with an AHI of 13/h.  On auto CPAP from 4 to 15cm H2O   Osteoarthritis    Pacemaker    SOB (shortness of breath)    Thrombocytosis    Undiagnosed cardiac murmurs    Vitamin B 12 deficiency    Vitamin D  deficiency     Past Surgical History:  Procedure Laterality Date   BARIATRIC SURGERY  06/17/2010   BILATERAL SALPINGECTOMY Bilateral 06/22/2014   Procedure: BILATERAL SALPINGECTOMY;  Surgeon: Ovid All, MD;  Location: [facility];  Service: Gynecology;  Laterality: Bilateral;   CYSTOSCOPY N/A 06/22/2014   Procedure: CYSTOSCOPY;  Surgeon: Ovid All, MD;  Location: [facility];  Service: Gynecology;  Laterality: N/A;   ENDOMETRIAL ABLATION  04/2007   FOOT SURGERY Left 11/24/2017   Dr Magdalen   IR LUMBAR PUNCTURE  01/22/2024   LAPAROSCOPIC ASSISTED VAGINAL HYSTERECTOMY N/A 06/22/2014   Procedure: LAPAROSCOPIC ASSISTED VAGINAL HYSTERECTOMY;  Surgeon: Ovid All, MD;  Location: [facility];  Service: Gynecology;  Laterality: N/A;   LEAD REVISION/REPAIR N/A 12/17/2021   Procedure: LEAD REVISION/REPAIR;  Surgeon: Waddell Danelle ORN, MD;  Location: [facility];  Service: Cardiovascular;  Laterality: N/A;   LIPOMA EXCISION     upper back   PACEMAKER IMPLANT N/A 12/09/2021   Procedure: PACEMAKER IMPLANT;  Surgeon: Waddell Danelle ORN, MD;  Location: [facility]IVE CV LAB;  Service: Cardiovascular;  Laterality: N/A;   SPINE SURGERY  02/10/2012   L4-l5 fusion--cohen    Current Medications: Current Meds  Medication Sig   acetaminophen  (TYLENOL ) 325 MG tablet Take 1-2 tablets (325-650 mg total) by mouth every 4 (four) hours as needed for mild pain.   amLODipine  (NORVASC ) 2.5 MG tablet Take 1 tablet (2.5 mg total) by mouth daily.   Biotin 1000 MCG tablet Take 1,000 mcg by mouth every other day.   celecoxib  (CELEBREX ) 200 MG capsule TAKE 1 CAPSULE BY MOUTH DAILY AS NEEDED FOR PAIN   cetirizine  (ZYRTEC  ALLERGY) 10 MG tablet Take 1  tablet (10 mg total) by mouth daily.   Cholecalciferol  (VITAMIN D3) 50 MCG (2000 UT) TABS Take 2,000 Units by mouth daily.   COLLAGEN PO Take by mouth. Liquid collagen with biotin   cyanocobalamin  (VITAMIN B12) 1000 MCG/ML injection Inject 1 ml into the muscle once a week for a month.  Then inject 1 ml into the muscle once a month thereafter.   famotidine (PEPCID) 40 MG tablet Take 40 mg by mouth 2 (two) times daily.   fluticasone  (FLONASE ) 50 MCG/ACT nasal spray Place 2 sprays into both nostrils daily.   furosemide  (LASIX ) 40 MG tablet Take 1 tablet (40 mg total) by mouth daily.   Glucosamine HCl (GLUCOSAMINE PO) Take 1,000 mg by mouth daily.   hydrochlorothiazide  (HYDRODIURIL ) 25 MG tablet TAKE 1 TABLET (25 MG TOTAL) BY MOUTH DAILY.   Ibuprofen  200 MG CAPS Take 400 mg by mouth daily as needed (For pain).   levothyroxine  (SYNTHROID ) 88 MCG tablet TAKE 1 TABLET BY MOUTH EVERY DAY   loratadine  (CLARITIN ) 10 MG tablet TAKE 1 TABLET BY MOUTH EVERY DAY   Magnesium 250 MG TABS Take 420 mg by mouth daily. For leg cramps   Menthol , Topical Analgesic, (BIOFREEZE EX) Apply 1 application  topically daily as needed (On Knees and feet).   metoprolol  succinate (TOPROL  XL) 25 MG 24 hr tablet Take 1 tablet (25 mg total) by mouth 2 (two) times daily.   potassium chloride  SA (KLOR-CON  M20) 20 MEQ tablet Take 1 tablet (20 mEq total) by mouth daily.   SYRINGE-NEEDLE, DISP, 3 ML (BD SAFETYGLIDE SYRINGE/NEEDLE) 25G X 1 3 ML MISC Use for B12 injections   Turmeric 500 MG CAPS Take 500 mg by mouth as needed. 3 times weekly, 1 tab po   valsartan  (DIOVAN ) 160 MG tablet TAKE 1 TABLET BY MOUTH EVERY DAY     Allergies:   Penicillins   Social History   Socioeconomic History   Marital status: Married    Spouse name: Cordella   Number of children: 3   Years of education: Some college   Highest education level: 12th grade  Occupational History   Occupation: Herbalist: UNEMPLOYED   Occupation: stay at  home mom  Tobacco Use   Smoking status: Never    Passive exposure: Never   Smokeless tobacco: Never   Tobacco comments:    Never Used Tobacco  Vaping Use   Vaping status: Never Used  Substance and Sexual Activity   Alcohol use: No   Drug use: No   Sexual activity: Yes    Partners: Male    Birth control/protection: Surgical    Comment: Hysterectomy  Other Topics Concern   Not on file  Social History Narrative   09/21/19   From: [place], [place] originally   Living: with  husband, Cordella and 2 daughters w/ family   Work: homemaker      Family: 3 children - [place], [place], and Hospital Doctor. Has 8 grandchildren and one on the way      Enjoys: spend time with family, shopping      Exercise: trying to walk more   Diet: eats late at night, will go most of the day w/o eating      Safety   Seat belts: Yes    Guns: Yes  and secure   Safe in relationships: Yes    Social Drivers of Health   Financial Resource Strain: Low Risk  (03/25/2024)   Overall Financial Resource Strain (CARDIA)    Difficulty of Paying Living Expenses: Not hard at all  Food Insecurity: No Food Insecurity (03/25/2024)   Hunger Vital Sign    Worried About Running Out of Food in the Last Year: Never true    Ran Out of Food in the Last Year: Never true  Transportation Needs: No Transportation Needs (03/25/2024)   PRAPARE - Administrator, Civil Service (Medical): No    Lack of Transportation (Non-Medical): No  Physical Activity: Insufficiently Active (03/25/2024)   Exercise Vital Sign    Days of Exercise per Week: 2 days    Minutes of Exercise per Session: 10 min  Stress: No Stress Concern Present (03/25/2024)   [employer] of Occupational Health - Occupational Stress Questionnaire    Feeling of Stress: Not at all  Social Connections: Moderately Integrated (03/25/2024)   Social Connection and Isolation Panel    Frequency of Communication with Friends and Family: Three times a week     Frequency of Social Gatherings with Friends and Family: Twice a week    Attends Religious Services: More than 4 times per year    Active Member of [employer] or Organizations: No    Attends Engineer, Structural: Not on file    Marital Status: Married     Family History: The patient's family history includes Anxiety disorder in her mother; Arrhythmia in her mother; Arthritis in her mother; Breast cancer (age of onset: 48) in her maternal aunt; Breast cancer (age of onset: 72) in her mother; Cancer in her father; Colon polyps in her father; Diabetes in her mother; Healthy in her daughter, daughter, and daughter; Heart disease in her mother; Hyperlipidemia in her mother; Hypertension in her father and mother; Kidney cancer in her father; Lymphoma in her father; Obesity in her father; Ovarian cancer in her paternal grandmother; Stroke (age of onset: 67) in her maternal grandmother; Thyroid  disease in her mother. There is no history of Colon cancer.  ROS:   Review of Systems  Constitution: Negative for decreased appetite, fever and weight gain.  HENT: Negative for congestion, ear discharge, hoarse voice and sore throat.   Eyes: Negative for discharge, redness, vision loss in right eye and visual halos.  Cardiovascular: Reports palpitations. Negative for chest pain, dyspnea on exertion, leg swelling, orthopnea  Respiratory: Negative for cough, hemoptysis, shortness of breath and snoring.   Endocrine: Negative for heat intolerance and polyphagia.  Hematologic/Lymphatic: Negative for bleeding problem. Does not bruise/bleed easily.  Skin: Negative for flushing, nail changes, rash and suspicious lesions.  Musculoskeletal: Negative for arthritis, joint pain, muscle cramps, myalgias, neck pain and stiffness.  Gastrointestinal: Negative for abdominal pain, bowel incontinence, diarrhea and excessive appetite.  Genitourinary: Negative for decreased libido, genital sores and incomplete emptying.   Neurological: Negative for brief paralysis, focal weakness, headaches and  loss of balance.  Psychiatric/Behavioral: Negative for altered mental status, depression and suicidal ideas.  Allergic/Immunologic: Negative for HIV exposure and persistent infections.    EKGs/Labs/Other Studies Reviewed:    The following studies were reviewed today:   EKG:  The ekg ordered today demonstrates   bpm Recent Labs: 10/21/2023: Magnesium 2.0 04/14/2024: Hemoglobin 13.7; Platelet Count 420 04/27/2024: ALT 23; BUN 13; Creatinine, Ser 0.65; Potassium 4.5; Sodium 140; TSH 1.930  Recent Lipid Panel    Component Value Date/Time   CHOL 203 (H) 06/11/2023 1304   TRIG 64 06/11/2023 1304   HDL 65 06/11/2023 1304   CHOLHDL 3 09/09/2022 1006   VLDL 16.8 09/09/2022 1006   LDLCALC 126 (H) 06/11/2023 1304    Physical Exam:    VS:  BP 124/70 (BP Location: Left Arm, Patient Position: Sitting, Cuff Size: Large)   Pulse 60   Ht 5' 4 (1.626 m)   Wt 244 lb 6.4 oz (110.9 kg)   LMP 01/20/2011   SpO2 98%   BMI 41.95 kg/m     Wt Readings from Last 3 Encounters:  04/28/24 244 lb 6.4 oz (110.9 kg)  04/14/24 241 lb (109.3 kg)  03/29/24 244 lb (110.7 kg)     GEN: Well nourished, well developed in no acute distress HEENT: Normal NECK: No JVD; No carotid bruits LYMPHATICS: No lymphadenopathy CARDIAC: S1S2 noted,RRR, no murmurs, rubs, gallops RESPIRATORY:  Clear to auscultation without rales, wheezing or rhonchi  ABDOMEN: Soft, non-tender, non-distended, +bowel sounds, no guarding. EXTREMITIES: No edema, No cyanosis, no clubbing MUSCULOSKELETAL:  No deformity  SKIN: Warm and dry NEUROLOGIC:  Alert and oriented x 3, non-focal PSYCHIATRIC:  Normal affect, good insight  ASSESSMENT:    1. Pre-procedural examination   2. DOE (dyspnea on exertion)   3. Chest pain of uncertain etiology   4. OSA on CPAP   5. Pacemaker   6. Heart block AV complete (HCC)   7. PVC (premature ventricular contraction)      PLAN:    Chest pain and Dyspnea on exertion that worsened to rest - her symptoms are profound and needs attention to rule out cardiovascular etiology. She has a CCTA several years ago with no CAD but at this juncture she need to be reevaluated. It is most appropriate at this time to get a right and left heart cath: rule out ischemic etiology and guiding the need or understanding if this may be microvascular angina if negative/ the pursing optimizing antiginal. She needs the right heart cath; with her rheumatic/connective tissue disease, OSA I want to assess her right heart pressure. Recent echo not impressive for elevated pulmonary artery pressure. Her symptoms continues to suggest otherwise.   Informed Consent   Shared Decision Making/Informed Consent The risks [stroke (1 in 1000), death (1 in 1000), kidney failure [usually temporary] (1 in 500), bleeding (1 in 200), allergic reaction [possibly serious] (1 in 200)], benefits (diagnostic support and management of coronary artery disease) and alternatives of a cardiac catheterization were discussed in detail with Ms. Rovito and she is willing to proceed.      In the meantime will continue her beta blocker, Add Amlodipine 2.5 mg daily, if Cath suggest microvascular angina will optimize adding other antianginal as clinically necessary.   Will get a pulmonary function test as well.   Premature Ventricular Contractions (PVCs) - continue the metoprolol    Heart block s/p pacemaker implantation - 09/14/2023 interrogation reviewed.  PAF - on rate control and Eliquis for stroke prevention.  Hx of OSA - recently back on cpap.   Hypertension - at target, monitor on addition of amlodipine.   She had follow-up with GI in the past for liver and it is best that she continue to see them.  Will refer her to [facility]   The patient is in agreement with the above plan. The patient left the office in stable condition.  The patient will follow up in 6 months  or sooner if needed.   Medication Adjustments/Labs and Tests Ordered: Current medicines are reviewed at length with the patient today.  Concerns regarding medicines are outlined above.  Orders Placed This Encounter  Procedures   EKG 12-Lead   Meds ordered this encounter  Medications   amLODipine (NORVASC) 2.5 MG tablet    Sig: Take 1 tablet (2.5 mg total) by mouth daily.    Dispense:  90 tablet    Refill:  3    Patient Instructions  Medication Instructions:  Your physician has recommended you make the following change in your medication:  START: Amlodipine 2.5 mg once daily *If you need a refill on your cardiac medications before your next appointment, please call your pharmacy*  Testing/Procedures: You are scheduled for a Cardiac Catheterization on Thursday, December 18 with Dr. Newman Lawrence.  1. Please arrive at the [facility] (Main Entrance A) at [facility]: [address] [place], [place] [address] at 5:30 AM (This time is 2 hour(s) before your procedure to ensure your preparation).   Free valet parking service is available. You will check in at ADMITTING. The support person will be asked to wait in the waiting room.  It is OK to have someone drop you off and come back when you are ready to be discharged.    Special note: Every effort is made to have your procedure done on time. Please understand that emergencies sometimes delay scheduled procedures.  2. Diet: Nothing to eat after midnight.   3. Hydration: You need to be well hydrated before your procedure. On December 18, you may drink approved liquids (see below) until 2 hours before the procedure, with 16 oz of water as your last intake.   List of approved liquids water, clear juice, clear tea, black coffee, fruit juices, non-citric and without pulp, carbonated beverages, Gatorade, Kool -Aid, plain Jello-O and plain ice popsicles.  4. Labs: You will need to have blood drawn   5. Medication instructions  in preparation for your procedure:   Contrast Allergy: No  Stop taking, Diovan  (Valsartan ), Advil  or Motrin  (Ibuprofen ), Lasix  (Furosemide )  Wednesday, December 17 (Has to be held 24 hours before procedure).   On the morning of your procedure, take your Aspirin 81 mg and any morning medicines NOT listed above.  You may use sips of water.  6. Plan to go home the same day, you will only stay overnight if medically necessary. 7. Bring a current list of your medications and current insurance cards. 8. You MUST have a responsible person to drive you home. 9. Someone MUST be with you the first 24 hours after you arrive home or your discharge will be delayed. 10. Please wear clothes that are easy to get on and off and wear slip-on shoes.  Thank you for allowing us  to care for you!   -- [facility]  Pulmonary Function Tests Pulmonary function tests (PFTs) are breathing tests that are used to: Measure how well your lungs work. Find out what is causing your lung  problems. Find the best treatment for you. You may have PFTs: If you have a condition that affects your lungs, such as asthma or chronic obstructive pulmonary disease (COPD). To watch for changes in your lung function over time if you have a long-term (chronic) lung disease. If you are an it trainer. PFTs check the effects of being exposed to chemicals over a long period of time. To check lung function: Before having surgery or other procedures. If you smoke. To check if prescribed medicines or treatments are helping your lungs. Tell a health care provider about: Any allergies you have. All medicines you are taking, including inhaler or nebulizer medicines, vitamins, herbs, eye drops, creams, and over-the-counter medicines. Any bleeding problems you have. Any surgeries you have had, especially recent surgery of the eye, abdomen, or chest. These can make PFTs difficult or unsafe. Any  medical conditions you have, including chest pain or heart problems, tuberculosis, or respiratory infections such as pneumonia, a cold, or the flu. Any fear of being in closed spaces (claustrophobia). Some of your tests may be in a closed space. What are the risks? Your health care provider will talk with you about risks. These may include: Feeling light-headed due to fast, deep breathing known as overbreathing or hyperventilation. An asthma attack from deep breathing. What happens before the test? Take over-the-counter and prescription medicines only as told by your health care provider. If you take inhaler or nebulizer medicines, ask your health care provider which medicines you should take on the day of your testing. Some inhaler medicines may interfere with PFTs if they are taken shortly before the tests. Follow instructions from your health care provider about what you may eat and drink. These may include: Avoiding eating large meals. Avoiding using caffeine before the testing. Not drinkingalcohol for up to 4 hours before the test. Do not use any products that contain nicotine or tobacco for up to 4 hours before your test. These products include cigarettes, chewing tobacco, and vaping devices, such as e-cigarettes. These can affect your test results. If you need help quitting, ask your health care provider. Wear comfortable clothing that will not get in the way of your breathing. Avoid exercise that takes a lot of effort (strenuous exercise) for at least 30 minutes before the test. What happens during the test?  You will be given: A soft noseclip to wear. This allows all of your breaths to go through your mouth instead of your nose. A germ-free (sterile) mouthpiece. It will be attached to a spirometer machine that measures your breathing. You will be asked to do breathing exercises. The exercises will be done by breathing in (inhaling) and breathing out (exhaling). You may have to repeat the  exercises many times before the testing is complete. You will need to follow instructions exactly as told to get accurate results. Make sure to blow as hard and as fast as you can when you are told to do so. You may be given a medicine called a bronchodilator. This makes the small air passages in your lungs larger so you can breathe easier. The tests will be repeated after the medicine takes effect. You will be watched for any problems, such as feeling faint or dizzy, or having trouble breathing. The procedure may vary among health care providers and hospitals. What can I expect after the test? Your results will be compared with the expected lung function of someone with healthy lungs who is similar to you in several ways. These  ways include age, sex, height, weight, and race or ethnicity. This is done to show how your lung function compares with normal lung function (percent predicted). The percent predicted helps your health care provider know if your lung function is normal or not. If you have had PFTs done before, your health care provider will compare your current results with past results. This shows if your lung function is better, worse, or the same as before. It is up to you to get the results of your procedure. Ask your health care provider, or the department that is doing the procedure, when your results will be ready. After you get your results, talk with your health care provider about treatment options, if necessary. This information is not intended to replace advice given to you by your health care provider. Make sure you discuss any questions you have with your health care provider. Document Revised: 12/02/2021 Document Reviewed: 12/02/2021 Elsevier Patient Education  2024 Elsevier Inc. Follow-Up: At [facility], you and your health needs are our priority.  As part of our continuing mission to provide you with exceptional heart care, our providers are all part of one team.   This team includes your primary Cardiologist (physician) and Advanced Practice Providers or APPs (Physician Assistants and Nurse Practitioners) who all work together to provide you with the care you need, when you need it.  Your next appointment:   2-4 week(s)  Provider:   Breunna Nordmann, DO      Adopting a Healthy Lifestyle.  Know what a healthy weight is for you (roughly BMI <25) and aim to maintain this   Aim for 7+ servings of fruits and vegetables daily   65-80+ fluid ounces of water or unsweet tea for healthy kidneys   Limit to max 1 drink of alcohol per day; avoid smoking/tobacco   Limit animal fats in diet for cholesterol and heart health - choose grass fed whenever available   Avoid highly processed foods, and foods high in saturated/trans fats   Aim for low stress - take time to unwind and care for your mental health   Aim for 150 min of moderate intensity exercise weekly for heart health, and weights twice weekly for bone health   Aim for 7-9 hours of sleep daily   When it comes to diets, agreement about the perfect plan isnt easy to find, even among the experts. Experts at the [facility] of [employer] developed an idea known as the Healthy Eating Plate. Just imagine a plate divided into logical, healthy portions.   The emphasis is on diet quality:   Load up on vegetables and fruits - one-half of your plate: Aim for color and variety, and remember that potatoes dont count.   Go for whole grains - one-quarter of your plate: Whole wheat, barley, wheat berries, quinoa, oats, brown rice, and foods made with them. If you want pasta, go with whole wheat pasta.   Protein power - one-quarter of your plate: Fish, chicken, beans, and nuts are all healthy, versatile protein sources. Limit red meat.   The diet, however, does go beyond the plate, offering a few other suggestions.   Use healthy plant oils, such as olive, canola, soy, corn, sunflower and peanut. Check the  labels, and avoid partially hydrogenated oil, which have unhealthy trans fats.   If youre thirsty, drink water. Coffee and tea are good in moderation, but skip sugary drinks and limit milk and dairy products to one or two daily servings.  The type of carbohydrate in the diet is more important than the amount. Some sources of carbohydrates, such as vegetables, fruits, whole grains, and beans-are healthier than others.   Finally, stay active  Signed, Dub Huntsman, DO  04/28/2024 10:41 AM    [facility]

## 2024-04-28 NOTE — Addendum Note (Signed)
 Addended by: MELIDA ROLIN HERO on: 04/28/2024 11:00 AM   Modules accepted: Orders

## 2024-04-28 NOTE — Patient Instructions (Addendum)
 Medication Instructions:  Your physician has recommended you make the following change in your medication:  START: Amlodipine 2.5 mg once daily *If you need a refill on your cardiac medications before your next appointment, please call your pharmacy*  Testing/Procedures: You are scheduled for a Cardiac Catheterization on Thursday, December 18 with Dr. Newman Lawrence.  1. Please arrive at the [facility] (Main Entrance A) at [facility]: [address] [place], [place] [address] at 5:30 AM (This time is 2 hour(s) before your procedure to ensure your preparation).   Free valet parking service is available. You will check in at ADMITTING. The support person will be asked to wait in the waiting room.  It is OK to have someone drop you off and come back when you are ready to be discharged.    Special note: Every effort is made to have your procedure done on time. Please understand that emergencies sometimes delay scheduled procedures.  2. Diet: Nothing to eat after midnight.   3. Hydration: You need to be well hydrated before your procedure. On December 18, you may drink approved liquids (see below) until 2 hours before the procedure, with 16 oz of water as your last intake.   List of approved liquids water, clear juice, clear tea, black coffee, fruit juices, non-citric and without pulp, carbonated beverages, Gatorade, Kool -Aid, plain Jello-O and plain ice popsicles.  4. Labs: You will need to have blood drawn   5. Medication instructions in preparation for your procedure:   Contrast Allergy: No  Stop taking, Diovan  (Valsartan ), Advil  or Motrin  (Ibuprofen ), Lasix  (Furosemide )  Wednesday, December 17 (Has to be held 24 hours before procedure).   On the morning of your procedure, take your Aspirin 81 mg and any morning medicines NOT listed above.  You may use sips of water.  6. Plan to go home the same day, you will only stay overnight if medically necessary. 7. Bring a current  list of your medications and current insurance cards. 8. You MUST have a responsible person to drive you home. 9. Someone MUST be with you the first 24 hours after you arrive home or your discharge will be delayed. 10. Please wear clothes that are easy to get on and off and wear slip-on shoes.  Thank you for allowing us  to care for you!   -- [facility]  Pulmonary Function Tests Pulmonary function tests (PFTs) are breathing tests that are used to: Measure how well your lungs work. Find out what is causing your lung problems. Find the best treatment for you. You may have PFTs: If you have a condition that affects your lungs, such as asthma or chronic obstructive pulmonary disease (COPD). To watch for changes in your lung function over time if you have a long-term (chronic) lung disease. If you are an it trainer. PFTs check the effects of being exposed to chemicals over a long period of time. To check lung function: Before having surgery or other procedures. If you smoke. To check if prescribed medicines or treatments are helping your lungs. Tell a health care provider about: Any allergies you have. All medicines you are taking, including inhaler or nebulizer medicines, vitamins, herbs, eye drops, creams, and over-the-counter medicines. Any bleeding problems you have. Any surgeries you have had, especially recent surgery of the eye, abdomen, or chest. These can make PFTs difficult or unsafe. Any medical conditions you have, including chest pain or heart problems, tuberculosis, or respiratory infections such  as pneumonia, a cold, or the flu. Any fear of being in closed spaces (claustrophobia). Some of your tests may be in a closed space. What are the risks? Your health care provider will talk with you about risks. These may include: Feeling light-headed due to fast, deep breathing known as overbreathing or hyperventilation. An asthma attack  from deep breathing. What happens before the test? Take over-the-counter and prescription medicines only as told by your health care provider. If you take inhaler or nebulizer medicines, ask your health care provider which medicines you should take on the day of your testing. Some inhaler medicines may interfere with PFTs if they are taken shortly before the tests. Follow instructions from your health care provider about what you may eat and drink. These may include: Avoiding eating large meals. Avoiding using caffeine before the testing. Not drinkingalcohol for up to 4 hours before the test. Do not use any products that contain nicotine or tobacco for up to 4 hours before your test. These products include cigarettes, chewing tobacco, and vaping devices, such as e-cigarettes. These can affect your test results. If you need help quitting, ask your health care provider. Wear comfortable clothing that will not get in the way of your breathing. Avoid exercise that takes a lot of effort (strenuous exercise) for at least 30 minutes before the test. What happens during the test?  You will be given: A soft noseclip to wear. This allows all of your breaths to go through your mouth instead of your nose. A germ-free (sterile) mouthpiece. It will be attached to a spirometer machine that measures your breathing. You will be asked to do breathing exercises. The exercises will be done by breathing in (inhaling) and breathing out (exhaling). You may have to repeat the exercises many times before the testing is complete. You will need to follow instructions exactly as told to get accurate results. Make sure to blow as hard and as fast as you can when you are told to do so. You may be given a medicine called a bronchodilator. This makes the small air passages in your lungs larger so you can breathe easier. The tests will be repeated after the medicine takes effect. You will be watched for any problems, such as  feeling faint or dizzy, or having trouble breathing. The procedure may vary among health care providers and hospitals. What can I expect after the test? Your results will be compared with the expected lung function of someone with healthy lungs who is similar to you in several ways. These ways include age, sex, height, weight, and race or ethnicity. This is done to show how your lung function compares with normal lung function (percent predicted). The percent predicted helps your health care provider know if your lung function is normal or not. If you have had PFTs done before, your health care provider will compare your current results with past results. This shows if your lung function is better, worse, or the same as before. It is up to you to get the results of your procedure. Ask your health care provider, or the department that is doing the procedure, when your results will be ready. After you get your results, talk with your health care provider about treatment options, if necessary. This information is not intended to replace advice given to you by your health care provider. Make sure you discuss any questions you have with your health care provider. Document Revised: 12/02/2021 Document Reviewed: 12/02/2021 Elsevier Patient Education  2024  Elsevier Inc.  Follow-Up: At [facility], you and your health needs are our priority.  As part of our continuing mission to provide you with exceptional heart care, our providers are all part of one team.  This team includes your primary Cardiologist (physician) and Advanced Practice Providers or APPs (Physician Assistants and Nurse Practitioners) who all work together to provide you with the care you need, when you need it.  Your next appointment:   2-4 week(s) after Dec 18th  Provider:   Kardie Tobb, DO

## 2024-05-04 ENCOUNTER — Encounter: Payer: Self-pay | Admitting: Internal Medicine

## 2024-05-04 ENCOUNTER — Ambulatory Visit: Attending: Internal Medicine | Admitting: Internal Medicine

## 2024-05-04 VITALS — BP 108/72 | HR 63 | Ht 63.0 in | Wt 245.4 lb

## 2024-05-04 DIAGNOSIS — Z950 Presence of cardiac pacemaker: Secondary | ICD-10-CM

## 2024-05-04 DIAGNOSIS — R0609 Other forms of dyspnea: Secondary | ICD-10-CM | POA: Diagnosis not present

## 2024-05-04 DIAGNOSIS — R079 Chest pain, unspecified: Secondary | ICD-10-CM | POA: Diagnosis not present

## 2024-05-04 DIAGNOSIS — I442 Atrioventricular block, complete: Secondary | ICD-10-CM

## 2024-05-04 DIAGNOSIS — I493 Ventricular premature depolarization: Secondary | ICD-10-CM | POA: Diagnosis not present

## 2024-05-04 NOTE — Progress Notes (Signed)
 HPI [name] returns today for followup. She is a pleasant 58 yo woman with intermittent AV block who underwent PPM insertion complicated by lead migration and asymptomatic perforation. She underwent insertion of a new lead. She had problems with significant cardiac awareness and workup was unremarkable. She was noted to have an initial elevation in her pacing threshold which have stabilized. No chest pain or sob.  Allergies  Allergen Reactions   Penicillins Other (See Comments)    Causes severe, difficult to treat yeast infections     Current Outpatient Medications  Medication Sig Dispense Refill   acetaminophen  (TYLENOL ) 325 MG tablet Take 1-2 tablets (325-650 mg total) by mouth every 4 (four) hours as needed for mild pain.     amLODipine  (NORVASC ) 2.5 MG tablet Take 1 tablet (2.5 mg total) by mouth daily. 90 tablet 3   celecoxib  (CELEBREX ) 200 MG capsule TAKE 1 CAPSULE BY MOUTH DAILY AS NEEDED FOR PAIN 30 capsule 1   cetirizine  (ZYRTEC  ALLERGY) 10 MG tablet Take 1 tablet (10 mg total) by mouth daily. 30 tablet 0   Cholecalciferol  (VITAMIN D3) 50 MCG (2000 UT) TABS Take 2,000 Units by mouth daily.     COLLAGEN PO Take by mouth. Liquid collagen with biotin     cyanocobalamin  (VITAMIN B12) 1000 MCG/ML injection Inject 1 ml into the muscle once a week for a month.  Then inject 1 ml into the muscle once a month thereafter. 6 mL 11   famotidine (PEPCID) 40 MG tablet Take 40 mg by mouth 2 (two) times daily.     fluticasone  (FLONASE ) 50 MCG/ACT nasal spray Place 2 sprays into both nostrils daily. 16 g 6   furosemide  (LASIX ) 40 MG tablet Take 1 tablet (40 mg total) by mouth daily. 90 tablet 3   Glucosamine HCl (GLUCOSAMINE PO) Take 1,000 mg by mouth daily.     hydrochlorothiazide  (HYDRODIURIL ) 25 MG tablet TAKE 1 TABLET (25 MG TOTAL) BY MOUTH DAILY. 90 tablet 0   Ibuprofen  200 MG CAPS Take 400 mg by mouth daily as needed (For pain).     levothyroxine  (SYNTHROID ) 88 MCG tablet TAKE 1  TABLET BY MOUTH EVERY DAY 30 tablet 2   loratadine  (CLARITIN ) 10 MG tablet TAKE 1 TABLET BY MOUTH EVERY DAY 90 tablet 2   Magnesium 250 MG TABS Take 420 mg by mouth daily. For leg cramps     Menthol , Topical Analgesic, (BIOFREEZE EX) Apply 1 application  topically daily as needed (On Knees and feet).     metoprolol  succinate (TOPROL  XL) 25 MG 24 hr tablet Take 1 tablet (25 mg total) by mouth 2 (two) times daily. 180 tablet 2   nitroGLYCERIN  (NITROSTAT ) 0.4 MG SL tablet Place 1 tablet (0.4 mg total) under the tongue every 5 (five) minutes as needed for chest pain. 25 tablet 3   potassium chloride  SA (KLOR-CON  M20) 20 MEQ tablet Take 1 tablet (20 mEq total) by mouth daily. 90 tablet 3   Turmeric 500 MG CAPS Take 500 mg by mouth as needed. 3 times weekly, 1 tab po     valsartan  (DIOVAN ) 160 MG tablet TAKE 1 TABLET BY MOUTH EVERY DAY 90 tablet 3   Biotin 1000 MCG tablet Take 1,000 mcg by mouth every other day.     SYRINGE-NEEDLE, DISP, 3 ML (BD SAFETYGLIDE SYRINGE/NEEDLE) 25G X 1 3 ML MISC Use for B12 injections 100 each 3   No current facility-administered medications for this visit.  Past Medical History:  Diagnosis Date   A-fib [facility])    Allergic rhinitis, cause unspecified    Allergy 2016   Anemia, iron  deficiency    Arthritis    Back pain    CFS (chronic fatigue syndrome)    Chest pain    COVID-19 06/15/2020   Diastolic dysfunction    Diverticulosis of colon    Edema, lower extremity    Fatty liver    Gallbladder problem    GERD (gastroesophageal reflux disease)    Goiter    Hiatal hernia    Hypertension    Hypothyroidism    IBS (irritable bowel syndrome)    Joint pain    Morbid obesity (HCC)    Obstructive sleep apnea    OSA on CPAP    mild obstructive sleep apnea with an AHI of 13/h.  On auto CPAP from 4 to 15cm H2O   Osteoarthritis    Pacemaker    SOB (shortness of breath)    Thrombocytosis    Undiagnosed cardiac murmurs    Vitamin B 12 deficiency    Vitamin D   deficiency     ROS:   All systems reviewed and negative except as noted in the HPI.   Past Surgical History:  Procedure Laterality Date   BARIATRIC SURGERY  06/17/2010   BILATERAL SALPINGECTOMY Bilateral 06/22/2014   Procedure: BILATERAL SALPINGECTOMY;  Surgeon: Ovid All, MD;  Location: [facility];  Service: Gynecology;  Laterality: Bilateral;   CYSTOSCOPY N/A 06/22/2014   Procedure: CYSTOSCOPY;  Surgeon: Ovid All, MD;  Location: [facility];  Service: Gynecology;  Laterality: N/A;   ENDOMETRIAL ABLATION  04/2007   FOOT SURGERY Left 11/24/2017   Dr Magdalen   IR LUMBAR PUNCTURE  01/22/2024   LAPAROSCOPIC ASSISTED VAGINAL HYSTERECTOMY N/A 06/22/2014   Procedure: LAPAROSCOPIC ASSISTED VAGINAL HYSTERECTOMY;  Surgeon: Ovid All, MD;  Location: [facility];  Service: Gynecology;  Laterality: N/A;   LEAD REVISION/REPAIR N/A 12/17/2021   Procedure: LEAD REVISION/REPAIR;  Surgeon: Waddell Danelle ORN, MD;  Location: [facility];  Service: Cardiovascular;  Laterality: N/A;   LIPOMA EXCISION     upper back   PACEMAKER IMPLANT N/A 12/09/2021   Procedure: PACEMAKER IMPLANT;  Surgeon: Waddell Danelle ORN, MD;  Location: [facility];  Service: Cardiovascular;  Laterality: N/A;   SPINE SURGERY  02/10/2012   L4-l5 fusion--cohen     Family History  Problem Relation Age of Onset   Breast cancer Mother 72   Arrhythmia Mother    Heart disease Mother    Arthritis Mother    Diabetes Mother    Hyperlipidemia Mother    Hypertension Mother    Thyroid  disease Mother    Anxiety disorder Mother    Hypertension Father    Kidney cancer Father    Colon polyps Father    Lymphoma Father        Diffused Large B-cell Lymphoma   Cancer Father    Obesity Father    Stroke Maternal Grandmother 13   Ovarian cancer Paternal Grandmother    Healthy Daughter    Healthy Daughter    Healthy Daughter    Breast cancer Maternal Aunt 58   Colon cancer Neg Hx      Social History   Socioeconomic History    Marital status: Married    Spouse name: Cordella   Number of children: 3   Years of education: Some college   Highest education level: 12th grade  Occupational History   Occupation: proofreader  Employer: UNEMPLOYED   Occupation: stay at home mom  Tobacco Use   Smoking status: Never    Passive exposure: Never   Smokeless tobacco: Never   Tobacco comments:    Never Used Tobacco  Vaping Use   Vaping status: Never Used  Substance and Sexual Activity   Alcohol use: No   Drug use: No   Sexual activity: Yes    Partners: Male    Birth control/protection: Surgical    Comment: Hysterectomy  Other Topics Concern   Not on file  Social History Narrative   09/21/19   From: [facility] [place] originally   Living: with husband, Cordella and 2 daughters w/ family   Work: homemaker      Family: 3 children - [place], [place], and Hospital Doctor. Has 8 grandchildren and one on the way      Enjoys: spend time with family, shopping      Exercise: trying to walk more   Diet: eats late at night, will go most of the day w/o eating      Safety   Seat belts: Yes    Guns: Yes  and secure   Safe in relationships: Yes    Social Drivers of Health   Financial Resource Strain: Low Risk  (03/25/2024)   Overall Financial Resource Strain (CARDIA)    Difficulty of Paying Living Expenses: Not hard at all  Food Insecurity: No Food Insecurity (03/25/2024)   Hunger Vital Sign    Worried About Running Out of Food in the Last Year: Never true    Ran Out of Food in the Last Year: Never true  Transportation Needs: No Transportation Needs (03/25/2024)   PRAPARE - Administrator, Civil Service (Medical): No    Lack of Transportation (Non-Medical): No  Physical Activity: Insufficiently Active (03/25/2024)   Exercise Vital Sign    Days of Exercise per Week: 2 days    Minutes of Exercise per Session: 10 min  Stress: No Stress Concern Present (03/25/2024)   [employer] of Occupational Health -  Occupational Stress Questionnaire    Feeling of Stress: Not at all  Social Connections: Moderately Integrated (03/25/2024)   Social Connection and Isolation Panel    Frequency of Communication with Friends and Family: Three times a week    Frequency of Social Gatherings with Friends and Family: Twice a week    Attends Religious Services: More than 4 times per year    Active Member of [employer] or Organizations: No    Attends Engineer, Structural: Not on file    Marital Status: Married  Catering Manager Violence: Not on file     BP 108/72   Pulse 63   Ht 5' 3 (1.6 m)   Wt 245 lb 6.4 oz (111.3 kg)   LMP 01/20/2011   SpO2 96%   BMI 43.47 kg/m   Physical Exam:  Well appearing NAD HEENT: Unremarkable Neck:  No JVD, no thyromegally Lymphatics:  No adenopathy Back:  No CVA tenderness Lungs:  Clear with no wheezes HEART:  Regular rate rhythm, no murmurs, no rubs, no clicks Abd:  soft, positive bowel sounds, no organomegally, no rebound, no guarding Ext:  2 plus pulses, no edema, no cyanosis, no clubbing Skin:  No rashes no nodules Neuro:  CN II through XII intact, motor grossly intact  EKG - nsr  DEVICE  Normal device function.  See PaceArt for details.   Assess/Plan:  Intermittent heart block - she is asymptomatic s/p PPM.  She is pacing less than 1% of the time in the ventricle. PAF/SVT - she has had episodes lasting up to 90 seconds. I have asked her to continue her toprol  25 mg tablet bid.  PVC's - she has had minimal symptoms. We will follow.  Obesity - she needs to lose weight. She is up 2 lbs since her last visit.   Danelle Antonisha Waskey,MD

## 2024-05-04 NOTE — Patient Instructions (Signed)
 Medication Instructions:  Your physician recommends that you continue on your current medications as directed. Please refer to the Current Medication list given to you today.  *If you need a refill on your cardiac medications before your next appointment, please call your pharmacy*  Lab Work: None ordered.  You may go to any [facility] Location for your lab work:  [facility] - [address] ([facility]) - [address] [phone] [address]  [place] - [address]   [place]  - [address]   [place] - [address] - [address] [place]  - [address] - [address] ([facility]   If you have labs (blood work) drawn today and your tests are completely normal, you will receive your results only by: [employer] (if you have MyChart)  If you have any lab test that is abnormal or we need to change your treatment, we will call you or send a MyChart message to review the results.  Testing/Procedures: None ordered.  Follow-Up: At [facility], you and your health needs are our priority.  As part of our continuing mission to provide you with exceptional heart care, we have created designated Provider Care Teams.  These Care Teams include your primary Cardiologist (physician) and Advanced Practice Providers (APPs -  Physician Assistants and Nurse Practitioners) who all work together to provide you with   Your next appointment:   1 year(s)  The format for your next appointment:   In Person  Provider:   Donnice Primus, MD or one of the following Advanced Practice Providers on your designated Care Team:   Charlies Arthur, [place] Ozell Jodie Passey, [place] Leotis Barrack, NP  Note: Remote monitoring is used to monitor your Pacemaker/ ICD from home. This monitoring reduces the number of office visits required to check your device to one time per year. It  allows us  to keep an eye on the functioning of your device to ensure it is working properly.

## 2024-05-10 ENCOUNTER — Telehealth: Payer: Self-pay | Admitting: *Deleted

## 2024-05-10 NOTE — Telephone Encounter (Signed)
 Cardiac Catheterization scheduled at [facility] for: Thursday May 12, 2024 7:30 AM Arrival time [facility] Main Entrance A at: 5:30 AM  Diet: -Nothing to eat after midnight.  Hydration: -May drink clear liquids until 2 hours before the procedure.  Approved liquids: Water , clear tea, black coffee, fruit juices-non-citric and without pulp,Gatorade, plain Jello/popsicles.   -Please drink 16 oz of water  2 hours before procedure.  Medication instructions: -Hold:  Hydrochlorothiazide /Lasix /KCl-AM of procedure  -Other usual morning medications can be taken including aspirin  81 mg.  Plan to go home the same day, you will only stay overnight if medically necessary.  You must have responsible adult to drive you home.  Someone must be with you the first 24 hours after you arrive home.  Reviewed procedure instructions with patient.

## 2024-05-12 ENCOUNTER — Encounter (HOSPITAL_COMMUNITY): Payer: Self-pay | Admitting: Cardiology

## 2024-05-12 ENCOUNTER — Ambulatory Visit (HOSPITAL_COMMUNITY)
Admission: RE | Admit: 2024-05-12 | Discharge: 2024-05-12 | Disposition: A | Discharge status: Home / Self Care | Attending: Cardiology | Admitting: Cardiology

## 2024-05-12 ENCOUNTER — Ambulatory Visit: Payer: Self-pay | Admitting: Cardiology

## 2024-05-12 ENCOUNTER — Encounter (HOSPITAL_COMMUNITY)
Admission: RE | Disposition: A | Discharge status: Home / Self Care | Payer: Self-pay | Source: Home / Self Care | Attending: Cardiology

## 2024-05-12 ENCOUNTER — Other Ambulatory Visit: Payer: Self-pay

## 2024-05-12 DIAGNOSIS — I10 Essential (primary) hypertension: Secondary | ICD-10-CM | POA: Insufficient documentation

## 2024-05-12 DIAGNOSIS — E039 Hypothyroidism, unspecified: Secondary | ICD-10-CM | POA: Insufficient documentation

## 2024-05-12 DIAGNOSIS — I480 Paroxysmal atrial fibrillation: Secondary | ICD-10-CM | POA: Insufficient documentation

## 2024-05-12 DIAGNOSIS — G4733 Obstructive sleep apnea (adult) (pediatric): Secondary | ICD-10-CM | POA: Diagnosis not present

## 2024-05-12 DIAGNOSIS — R0609 Other forms of dyspnea: Secondary | ICD-10-CM | POA: Insufficient documentation

## 2024-05-12 DIAGNOSIS — I272 Pulmonary hypertension, unspecified: Secondary | ICD-10-CM | POA: Diagnosis not present

## 2024-05-12 DIAGNOSIS — I471 Supraventricular tachycardia, unspecified: Secondary | ICD-10-CM | POA: Diagnosis not present

## 2024-05-12 DIAGNOSIS — I4430 Unspecified atrioventricular block: Secondary | ICD-10-CM | POA: Insufficient documentation

## 2024-05-12 DIAGNOSIS — M351 Other overlap syndromes: Secondary | ICD-10-CM | POA: Insufficient documentation

## 2024-05-12 DIAGNOSIS — Z79899 Other long term (current) drug therapy: Secondary | ICD-10-CM | POA: Insufficient documentation

## 2024-05-12 DIAGNOSIS — Z950 Presence of cardiac pacemaker: Secondary | ICD-10-CM | POA: Diagnosis not present

## 2024-05-12 DIAGNOSIS — E669 Obesity, unspecified: Secondary | ICD-10-CM | POA: Diagnosis not present

## 2024-05-12 DIAGNOSIS — E785 Hyperlipidemia, unspecified: Secondary | ICD-10-CM | POA: Diagnosis not present

## 2024-05-12 DIAGNOSIS — Z6841 Body Mass Index (BMI) 40.0 and over, adult: Secondary | ICD-10-CM | POA: Insufficient documentation

## 2024-05-12 DIAGNOSIS — I493 Ventricular premature depolarization: Secondary | ICD-10-CM | POA: Diagnosis not present

## 2024-05-12 HISTORY — PX: RIGHT/LEFT HEART CATH AND CORONARY ANGIOGRAPHY: CATH118266

## 2024-05-12 LAB — POCT I-STAT 7, (LYTES, BLD GAS, ICA,H+H)
Acid-Base Excess: 1 mmol/L (ref 0.0–2.0)
Bicarbonate: 24.6 mmol/L (ref 20.0–28.0)
Calcium, Ion: 1.06 mmol/L — ABNORMAL LOW (ref 1.15–1.40)
HCT: 35 % — ABNORMAL LOW (ref 36.0–46.0)
Hemoglobin: 11.9 g/dL — ABNORMAL LOW (ref 12.0–15.0)
O2 Saturation: 97 %
Potassium: 3.1 mmol/L — ABNORMAL LOW (ref 3.5–5.1)
Sodium: 141 mmol/L (ref 135–145)
TCO2: 26 mmol/L (ref 22–32)
pCO2 arterial: 33.4 mmHg (ref 32–48)
pH, Arterial: 7.476 — ABNORMAL HIGH (ref 7.35–7.45)
pO2, Arterial: 79 mmHg — ABNORMAL LOW (ref 83–108)

## 2024-05-12 LAB — POCT I-STAT EG7
Acid-Base Excess: 1 mmol/L (ref 0.0–2.0)
Acid-Base Excess: 2 mmol/L (ref 0.0–2.0)
Acid-Base Excess: 1 mmol/L (ref 0.0–2.0)
Acid-Base Excess: 2 mmol/L (ref 0.0–2.0)
Acid-Base Excess: 3 mmol/L — ABNORMAL HIGH (ref 0.0–2.0)
Bicarbonate: 24.3 mmol/L (ref 20.0–28.0)
Bicarbonate: 26 mmol/L (ref 20.0–28.0)
Bicarbonate: 25.1 mmol/L (ref 20.0–28.0)
Bicarbonate: 25.7 mmol/L (ref 20.0–28.0)
Bicarbonate: 26.5 mmol/L (ref 20.0–28.0)
Calcium, Ion: 1.05 mmol/L — ABNORMAL LOW (ref 1.15–1.40)
Calcium, Ion: 1.09 mmol/L — ABNORMAL LOW (ref 1.15–1.40)
Calcium, Ion: 1.01 mmol/L — ABNORMAL LOW (ref 1.15–1.40)
Calcium, Ion: 1.09 mmol/L — ABNORMAL LOW (ref 1.15–1.40)
Calcium, Ion: 1.09 mmol/L — ABNORMAL LOW (ref 1.15–1.40)
HCT: 34 % — ABNORMAL LOW (ref 36.0–46.0)
HCT: 37 % (ref 36.0–46.0)
HCT: 34 % — ABNORMAL LOW (ref 36.0–46.0)
HCT: 35 % — ABNORMAL LOW (ref 36.0–46.0)
HCT: 35 % — ABNORMAL LOW (ref 36.0–46.0)
Hemoglobin: 11.6 g/dL — ABNORMAL LOW (ref 12.0–15.0)
Hemoglobin: 12.6 g/dL (ref 12.0–15.0)
Hemoglobin: 11.6 g/dL — ABNORMAL LOW (ref 12.0–15.0)
Hemoglobin: 11.9 g/dL — ABNORMAL LOW (ref 12.0–15.0)
Hemoglobin: 11.9 g/dL — ABNORMAL LOW (ref 12.0–15.0)
O2 Saturation: 76 %
O2 Saturation: 79 %
O2 Saturation: 73 %
O2 Saturation: 77 %
O2 Saturation: 79 %
Potassium: 3 mmol/L — ABNORMAL LOW (ref 3.5–5.1)
Potassium: 3.1 mmol/L — ABNORMAL LOW (ref 3.5–5.1)
Potassium: 3 mmol/L — ABNORMAL LOW (ref 3.5–5.1)
Potassium: 3.2 mmol/L — ABNORMAL LOW (ref 3.5–5.1)
Potassium: 3.2 mmol/L — ABNORMAL LOW (ref 3.5–5.1)
Sodium: 138 mmol/L (ref 135–145)
Sodium: 140 mmol/L (ref 135–145)
Sodium: 139 mmol/L (ref 135–145)
Sodium: 140 mmol/L (ref 135–145)
Sodium: 140 mmol/L (ref 135–145)
TCO2: 25 mmol/L (ref 22–32)
TCO2: 27 mmol/L (ref 22–32)
TCO2: 26 mmol/L (ref 22–32)
TCO2: 27 mmol/L (ref 22–32)
TCO2: 28 mmol/L (ref 22–32)
pCO2, Ven: 34.7 mmHg — ABNORMAL LOW (ref 44–60)
pCO2, Ven: 35.8 mmHg — ABNORMAL LOW (ref 44–60)
pCO2, Ven: 35.6 mmHg — ABNORMAL LOW (ref 44–60)
pCO2, Ven: 35.7 mmHg — ABNORMAL LOW (ref 44–60)
pCO2, Ven: 36.9 mmHg — ABNORMAL LOW (ref 44–60)
pH, Ven: 7.453 — ABNORMAL HIGH (ref 7.25–7.43)
pH, Ven: 7.469 — ABNORMAL HIGH (ref 7.25–7.43)
pH, Ven: 7.457 — ABNORMAL HIGH (ref 7.25–7.43)
pH, Ven: 7.464 — ABNORMAL HIGH (ref 7.25–7.43)
pH, Ven: 7.466 — ABNORMAL HIGH (ref 7.25–7.43)
pO2, Ven: 38 mmHg (ref 32–45)
pO2, Ven: 41 mmHg (ref 32–45)
pO2, Ven: 36 mmHg (ref 32–45)
pO2, Ven: 38 mmHg (ref 32–45)
pO2, Ven: 40 mmHg (ref 32–45)

## 2024-05-12 SURGERY — RIGHT/LEFT HEART CATH AND CORONARY ANGIOGRAPHY
Anesthesia: LOCAL

## 2024-05-12 MED ORDER — SODIUM CHLORIDE 0.9 % IV SOLN: 250.0000 mL | INTRAVENOUS | Status: DC | PRN

## 2024-05-12 MED ORDER — IOHEXOL 350 MG/ML SOLN
INTRAVENOUS | Status: DC | PRN
  Administered 2024-05-12: 08:00:00 60 mL

## 2024-05-12 MED ORDER — SODIUM CHLORIDE 0.9% FLUSH: 3.0000 mL | Freq: Two times a day (BID) | INTRAVENOUS | Status: DC

## 2024-05-12 MED ORDER — HEPARIN SODIUM (PORCINE) 1000 UNIT/ML IJ SOLN
INTRAMUSCULAR | Status: DC | PRN
  Administered 2024-05-12: 08:00:00 5500 [IU] via INTRAVENOUS

## 2024-05-12 MED ORDER — VERAPAMIL HCL 2.5 MG/ML IV SOLN
INTRAVENOUS | Status: AC
  Filled 2024-05-12: qty 2

## 2024-05-12 MED ORDER — ASPIRIN 81 MG PO CHEW: 81.0000 mg | CHEWABLE_TABLET | ORAL | Status: AC

## 2024-05-12 MED ORDER — MIDAZOLAM HCL 2 MG/2ML IJ SOLN
INTRAMUSCULAR | Status: AC
  Filled 2024-05-12: qty 2

## 2024-05-12 MED ORDER — FREE WATER: 500.0000 mL | Freq: Once | Status: DC

## 2024-05-12 MED ORDER — FENTANYL CITRATE (PF) 100 MCG/2ML IJ SOLN
INTRAMUSCULAR | Status: DC | PRN
  Administered 2024-05-12 (×2): 25 ug via INTRAVENOUS

## 2024-05-12 MED ORDER — MIDAZOLAM HCL (PF) 2 MG/2ML IJ SOLN
INTRAMUSCULAR | Status: DC | PRN
  Administered 2024-05-12 (×2): 1 mg via INTRAVENOUS

## 2024-05-12 MED ORDER — HEPARIN SODIUM (PORCINE) 1000 UNIT/ML IJ SOLN
INTRAMUSCULAR | Status: AC
  Filled 2024-05-12: qty 10

## 2024-05-12 MED ORDER — HYDRALAZINE HCL 20 MG/ML IJ SOLN: 10.0000 mg | INTRAMUSCULAR | Status: DC | PRN

## 2024-05-12 MED ORDER — SODIUM CHLORIDE 0.9% FLUSH: 3.0000 mL | INTRAVENOUS | Status: DC | PRN

## 2024-05-12 MED ORDER — LABETALOL HCL 5 MG/ML IV SOLN: 10.0000 mg | INTRAVENOUS | Status: DC | PRN

## 2024-05-12 MED ORDER — LIDOCAINE HCL (PF) 1 % IJ SOLN
INTRAMUSCULAR | Status: AC
  Filled 2024-05-12: qty 30

## 2024-05-12 MED ORDER — HEPARIN (PORCINE) IN NACL 1000-0.9 UT/500ML-% IV SOLN
INTRAVENOUS | Status: DC | PRN
  Administered 2024-05-12 (×2): 500 mL

## 2024-05-12 MED ORDER — LIDOCAINE HCL (PF) 1 % IJ SOLN
INTRAMUSCULAR | Status: DC | PRN
  Administered 2024-05-12 (×2): 5 mL via INTRADERMAL

## 2024-05-12 MED ORDER — FENTANYL CITRATE (PF) 100 MCG/2ML IJ SOLN
INTRAMUSCULAR | Status: AC
  Filled 2024-05-12: qty 2

## 2024-05-12 MED ORDER — VERAPAMIL HCL 2.5 MG/ML IV SOLN
INTRAVENOUS | Status: DC | PRN
  Administered 2024-05-12: 08:00:00 10 mL via INTRA_ARTERIAL

## 2024-05-12 MED ADMIN — Acetaminophen Tab 325 MG: 650 mg | ORAL | @ 10:00:00 | NDC 50580045811

## 2024-05-12 MED FILL — Acetaminophen Tab 325 MG: 650.0000 mg | ORAL | Qty: 2 | Status: AC

## 2024-05-12 SURGICAL SUPPLY — 11 items
CATH BALLN WEDGE 5F 110CM (CATHETERS) IMPLANT
CATH INFINITI 5FR ANG PIGTAIL (CATHETERS) IMPLANT
CATH INFINITI AMBI 5FR TG (CATHETERS) IMPLANT
DEVICE RAD COMP TR BAND LRG (VASCULAR PRODUCTS) IMPLANT
GLIDESHEATH SLEND A-KIT 6F 22G (SHEATH) IMPLANT
GUIDEWIRE .025 260CM (WIRE) IMPLANT
GUIDEWIRE INQWIRE 1.5J.035X260 (WIRE) IMPLANT
KIT SYRINGE INJ CVI SPIKEX1 (MISCELLANEOUS) IMPLANT
PACK CARDIAC CATHETERIZATION (CUSTOM PROCEDURE TRAY) ×1 IMPLANT
SET ATX-X65L (MISCELLANEOUS) IMPLANT
SHEATH GLIDE SLENDER 4/5FR (SHEATH) IMPLANT

## 2024-05-12 NOTE — Progress Notes (Signed)
 Patient and patient husband given discharge instructions, education provided no further questions at this time.Able to tolerate PO intake. Patient sites are clean, dry, intact with no hematoma noted upon discharge.

## 2024-05-12 NOTE — H&P (Signed)
 OV 05/04/2024 copied for documentation       HPI [name] returns today for followup. She is a pleasant 58 yo woman with intermittent AV block who underwent PPM insertion complicated by lead migration and asymptomatic perforation. She underwent insertion of a new lead. She had problems with significant cardiac awareness and workup was unremarkable. She was noted to have an initial elevation in her pacing threshold which have stabilized. No chest pain or sob.  Allergies  Allergen Reactions   Penicillins Other (See Comments)    Causes severe, difficult to treat yeast infections     Current Facility-Administered Medications  Medication Dose Route Frequency Provider Last Rate Last Admin   0.9 %  sodium chloride  infusion  250 mL Intravenous PRN Tobb, Kardie, DO       free water  500 mL  500 mL Oral Once Tobb, Kardie, DO       sodium chloride  flush (NS) 0.9 % injection 3 mL  3 mL Intravenous Q12H Tobb, Kardie, DO       sodium chloride  flush (NS) 0.9 % injection 3 mL  3 mL Intravenous PRN Tobb, Kardie, DO         Past Medical History:  Diagnosis Date   A-fib (HCC)    Allergic rhinitis, cause unspecified    Allergy 2016   Anemia, iron  deficiency    Arthritis    Back pain    CFS (chronic fatigue syndrome)    Chest pain    COVID-19 06/15/2020   Diastolic dysfunction    Diverticulosis of colon    Edema, lower extremity    Fatty liver    Gallbladder problem    GERD (gastroesophageal reflux disease)    Goiter    Hiatal hernia    Hypertension    Hypothyroidism    IBS (irritable bowel syndrome)    Joint pain    Morbid obesity (HCC)    Obstructive sleep apnea    OSA on CPAP    mild obstructive sleep apnea with an AHI of 13/h.  On auto CPAP from 4 to 15cm H2O   Osteoarthritis    Pacemaker    SOB (shortness of breath)    Thrombocytosis    Undiagnosed cardiac murmurs    Vitamin B 12 deficiency    Vitamin D  deficiency     ROS:   All systems reviewed and negative except as  noted in the HPI.   Past Surgical History:  Procedure Laterality Date   BARIATRIC SURGERY  06/17/2010   BILATERAL SALPINGECTOMY Bilateral 06/22/2014   Procedure: BILATERAL SALPINGECTOMY;  Surgeon: Ovid All, MD;  Location: [facility];  Service: Gynecology;  Laterality: Bilateral;   CYSTOSCOPY N/A 06/22/2014   Procedure: CYSTOSCOPY;  Surgeon: Ovid All, MD;  Location: [facility];  Service: Gynecology;  Laterality: N/A;   ENDOMETRIAL ABLATION  04/2007   FOOT SURGERY Left 11/24/2017   Dr Magdalen   IR LUMBAR PUNCTURE  01/22/2024   LAPAROSCOPIC ASSISTED VAGINAL HYSTERECTOMY N/A 06/22/2014   Procedure: LAPAROSCOPIC ASSISTED VAGINAL HYSTERECTOMY;  Surgeon: Ovid All, MD;  Location: [facility];  Service: Gynecology;  Laterality: N/A;   LEAD REVISION/REPAIR N/A 12/17/2021   Procedure: LEAD REVISION/REPAIR;  Surgeon: Waddell Danelle ORN, MD;  Location: [facility];  Service: Cardiovascular;  Laterality: N/A;   LIPOMA EXCISION     upper back   PACEMAKER IMPLANT N/A 12/09/2021   Procedure: PACEMAKER IMPLANT;  Surgeon: Waddell Danelle ORN, MD;  Location: [facility];  Service: Cardiovascular;  Laterality: N/A;  SPINE SURGERY  02/10/2012   L4-l5 fusion--cohen     Family History  Problem Relation Age of Onset   Breast cancer Mother 47   Arrhythmia Mother    Heart disease Mother    Arthritis Mother    Diabetes Mother    Hyperlipidemia Mother    Hypertension Mother    Thyroid  disease Mother    Anxiety disorder Mother    Hypertension Father    Kidney cancer Father    Colon polyps Father    Lymphoma Father        Diffused Large B-cell Lymphoma   Cancer Father    Obesity Father    Stroke Maternal Grandmother 55   Ovarian cancer Paternal Grandmother    Healthy Daughter    Healthy Daughter    Healthy Daughter    Breast cancer Maternal Aunt 58   Colon cancer Neg Hx      Social History   Socioeconomic History   Marital status: Married    Spouse name: Cordella   Number of  children: 3   Years of education: Some college   Highest education level: 12th grade  Occupational History   Occupation: Herbalist: UNEMPLOYED   Occupation: stay at home mom  Tobacco Use   Smoking status: Never    Passive exposure: Never   Smokeless tobacco: Never   Tobacco comments:    Never Used Tobacco  Vaping Use   Vaping status: Never Used  Substance and Sexual Activity   Alcohol use: No   Drug use: No   Sexual activity: Yes    Partners: Male    Birth control/protection: Surgical    Comment: Hysterectomy  Other Topics Concern   Not on file  Social History Narrative   09/21/19   From: [place], [place] originally   Living: with husband, Cordella and 2 daughters w/ family   Work: homemaker      Family: 3 children - [place], [place], and Hospital Doctor. Has 8 grandchildren and one on the way      Enjoys: spend time with family, shopping      Exercise: trying to walk more   Diet: eats late at night, will go most of the day w/o eating      Safety   Seat belts: Yes    Guns: Yes  and secure   Safe in relationships: Yes    Social Drivers of Health   Tobacco Use: Low Risk (05/04/2024)   Patient History    Smoking Tobacco Use: Never    Smokeless Tobacco Use: Never    Passive Exposure: Never  Financial Resource Strain: Low Risk (03/25/2024)   Overall Financial Resource Strain (CARDIA)    Difficulty of Paying Living Expenses: Not hard at all  Food Insecurity: No Food Insecurity (03/25/2024)   Epic    Worried About Programme Researcher, Broadcasting/film/video in the Last Year: Never true    Ran Out of Food in the Last Year: Never true  Transportation Needs: No Transportation Needs (03/25/2024)   Epic    Lack of Transportation (Medical): No    Lack of Transportation (Non-Medical): No  Physical Activity: Insufficiently Active (03/25/2024)   Exercise Vital Sign    Days of Exercise per Week: 2 days    Minutes of Exercise per Session: 10 min  Stress: No Stress Concern Present (03/25/2024)    [employer] of Occupational Health - Occupational Stress Questionnaire    Feeling of Stress: Not at all  Social Connections: Moderately Integrated (  03/25/2024)   Social Connection and Isolation Panel    Frequency of Communication with Friends and Family: Three times a week    Frequency of Social Gatherings with Friends and Family: Twice a week    Attends Religious Services: More than 4 times per year    Active Member of Clubs or Organizations: No    Attends Engineer, Structural: Not on file    Marital Status: Married  Catering Manager Violence: Not on file  Depression (PHQ2-9): Low Risk (04/14/2024)   Depression (PHQ2-9)    PHQ-2 Score: 0  Alcohol Screen: Not on file  Housing: Unknown (03/25/2024)   Epic    Unable to Pay for Housing in the Last Year: No    Number of Times Moved in the Last Year: Not on file    Homeless in the Last Year: No  Utilities: Not on file  Health Literacy: Not on file     BP (!) 135/59   Pulse 65   Temp 98.2 F (36.8 C) (Oral)   Resp 16   Ht 5' 3 (1.6 m)   Wt 109.8 kg   LMP 01/20/2011   SpO2 100%   BMI 42.87 kg/m   Physical Exam:  Well appearing NAD HEENT: Unremarkable Neck:  No JVD, no thyromegally Lymphatics:  No adenopathy Back:  No CVA tenderness Lungs:  Clear with no wheezes HEART:  Regular rate rhythm, no murmurs, no rubs, no clicks Abd:  soft, positive bowel sounds, no organomegally, no rebound, no guarding Ext:  2 plus pulses, no edema, no cyanosis, no clubbing Skin:  No rashes no nodules Neuro:  CN II through XII intact, motor grossly intact  EKG - nsr  DEVICE  Normal device function.  See PaceArt for details.   Assess/Plan:  Intermittent heart block - she is asymptomatic s/p PPM. She is pacing less than 1% of the time in the ventricle. PAF/SVT - she has had episodes lasting up to 90 seconds. I have asked her to continue her toprol  25 mg tablet bid.  PVC's - she has had minimal symptoms. We will follow.   Obesity - she needs to lose weight. She is up 2 lbs since her last visit.   Danelle Taylor,MD

## 2024-05-12 NOTE — Interval H&P Note (Signed)
 History and Physical Interval Note:  05/12/2024 7:43 AM  [name]  has presented today for surgery, with the diagnosis of cp  doa.  The various methods of treatment have been discussed with the patient and family. After consideration of risks, benefits and other options for treatment, the patient has consented to  Procedures: RIGHT/LEFT HEART CATH AND CORONARY ANGIOGRAPHY (N/A) as a surgical intervention.  The patient's history has been reviewed, patient examined, no change in status, stable for surgery.  I have reviewed the patient's chart and labs.  Questions were answered to the patient's satisfaction.     [name] J Jeriann Sayres

## 2024-05-16 ENCOUNTER — Ambulatory Visit (HOSPITAL_COMMUNITY)
Admission: RE | Admit: 2024-05-16 | Discharge: 2024-05-16 | Disposition: A | Discharge status: Home / Self Care | Source: Ambulatory Visit | Attending: Cardiology | Admitting: Cardiology

## 2024-05-16 DIAGNOSIS — R0609 Other forms of dyspnea: Secondary | ICD-10-CM | POA: Insufficient documentation

## 2024-05-16 LAB — PULMONARY FUNCTION TEST
DL/VA % pred: 87 %
DL/VA: 3.71 ml/min/mmHg/L
DLCO unc % pred: 85 %
DLCO unc: 16.7 ml/min/mmHg
FEF 25-75 Post: 2.24 L/s
FEF 25-75 Pre: 1.92 L/s
FEF2575-%Change-Post: 16 %
FEF2575-%Pred-Post: 95 %
FEF2575-%Pred-Pre: 82 %
FEV1-%Change-Post: 2 %
FEV1-%Pred-Post: 91 %
FEV1-%Pred-Pre: 89 %
FEV1-Post: 2.28 L
FEV1-Pre: 2.22 L
FEV1FVC-%Change-Post: 4 %
FEV1FVC-%Pred-Pre: 100 %
FEV6-%Change-Post: -2 %
FEV6-%Pred-Post: 88 %
FEV6-%Pred-Pre: 90 %
FEV6-Post: 2.74 L
FEV6-Pre: 2.8 L
FEV6FVC-%Change-Post: 0 %
FEV6FVC-%Pred-Post: 103 %
FEV6FVC-%Pred-Pre: 103 %
FVC-%Change-Post: -1 %
FVC-%Pred-Post: 86 %
FVC-%Pred-Pre: 87 %
FVC-Post: 2.77 L
FVC-Pre: 2.81 L
Post FEV1/FVC ratio: 82 %
Post FEV6/FVC ratio: 100 %
Pre FEV1/FVC ratio: 79 %
Pre FEV6/FVC Ratio: 100 %
RV % pred: 120 %
RV: 2.31 L
TLC % pred: 107 %
TLC: 5.27 L

## 2024-05-16 MED ORDER — ALBUTEROL SULFATE (2.5 MG/3ML) 0.083% IN NEBU
2.5000 mg | INHALATION_SOLUTION | Freq: Once | RESPIRATORY_TRACT | Status: AC
  Administered 2024-05-16: 2.5 mg via RESPIRATORY_TRACT

## 2024-05-18 ENCOUNTER — Encounter: Payer: Self-pay | Admitting: Cardiology

## 2024-05-18 ENCOUNTER — Ambulatory Visit: Attending: Cardiology | Admitting: Cardiology

## 2024-05-18 ENCOUNTER — Other Ambulatory Visit (HOSPITAL_COMMUNITY): Payer: Self-pay

## 2024-05-18 VITALS — BP 126/70 | HR 61 | Ht 63.0 in | Wt 246.0 lb

## 2024-05-18 DIAGNOSIS — M351 Other overlap syndromes: Secondary | ICD-10-CM | POA: Diagnosis not present

## 2024-05-18 DIAGNOSIS — Z950 Presence of cardiac pacemaker: Secondary | ICD-10-CM

## 2024-05-18 DIAGNOSIS — I272 Pulmonary hypertension, unspecified: Secondary | ICD-10-CM | POA: Diagnosis not present

## 2024-05-18 DIAGNOSIS — G4733 Obstructive sleep apnea (adult) (pediatric): Secondary | ICD-10-CM

## 2024-05-18 DIAGNOSIS — I10 Essential (primary) hypertension: Secondary | ICD-10-CM

## 2024-05-18 DIAGNOSIS — I480 Paroxysmal atrial fibrillation: Secondary | ICD-10-CM

## 2024-05-18 DIAGNOSIS — Z79899 Other long term (current) drug therapy: Secondary | ICD-10-CM | POA: Diagnosis not present

## 2024-05-18 DIAGNOSIS — I493 Ventricular premature depolarization: Secondary | ICD-10-CM

## 2024-05-18 DIAGNOSIS — E782 Mixed hyperlipidemia: Secondary | ICD-10-CM

## 2024-05-18 DIAGNOSIS — I442 Atrioventricular block, complete: Secondary | ICD-10-CM

## 2024-05-18 LAB — BASIC METABOLIC PANEL WITH GFR
BUN/Creatinine Ratio: 21 (ref 9–23)
BUN: 13 mg/dL (ref 6–24)
CO2: 26 mmol/L (ref 20–29)
Calcium: 9.5 mg/dL (ref 8.7–10.2)
Chloride: 100 mmol/L (ref 96–106)
Creatinine, Ser: 0.62 mg/dL (ref 0.57–1.00)
Glucose: 94 mg/dL (ref 70–99)
Potassium: 4.6 mmol/L (ref 3.5–5.2)
Sodium: 141 mmol/L (ref 134–144)
eGFR: 103 mL/min/1.73

## 2024-05-18 LAB — CUP PACEART INCLINIC DEVICE CHECK
Brady Statistic RA Percent Paced: 49 %
Brady Statistic RV Percent Paced: 1 % — CL
Date Time Interrogation Session: 20251210000000
Implantable Lead Connection Status: 753985
Implantable Lead Connection Status: 753985
Implantable Lead Implant Date: 20230717
Implantable Lead Implant Date: 20230717
Implantable Lead Location: 753859
Implantable Lead Location: 753860
Implantable Lead Model: 7841
Implantable Lead Model: 7842
Implantable Lead Serial Number: 1204466
Implantable Lead Serial Number: 1298900
Implantable Pulse Generator Implant Date: 20230717
Lead Channel Impedance Value: 552 Ohm
Lead Channel Impedance Value: 755 Ohm
Lead Channel Pacing Threshold Amplitude: 0.7 V
Lead Channel Pacing Threshold Amplitude: 1.6 V
Lead Channel Pacing Threshold Pulse Width: 0.4 ms
Lead Channel Pacing Threshold Pulse Width: 1 ms
Lead Channel Sensing Intrinsic Amplitude: 20 mV
Lead Channel Sensing Intrinsic Amplitude: 3.2 mV
Lead Channel Setting Pacing Amplitude: 2 V
Lead Channel Setting Pacing Amplitude: 3.5 V
Lead Channel Setting Pacing Pulse Width: 1 ms
Lead Channel Setting Sensing Sensitivity: 1 mV
Pulse Gen Serial Number: 603605
Zone Setting Status: 755011

## 2024-05-18 LAB — MAGNESIUM: Magnesium: 2 mg/dL (ref 1.6–2.3)

## 2024-05-18 MED ORDER — TORSEMIDE 20 MG PO TABS
20.0000 mg | ORAL_TABLET | ORAL | 3 refills | Status: AC
  Filled 2024-05-18: qty 116, 90d supply, fill #0

## 2024-05-18 MED ORDER — POTASSIUM CHLORIDE CRYS ER 20 MEQ PO TBCR
EXTENDED_RELEASE_TABLET | ORAL | 3 refills | Status: AC
  Filled 2024-05-18: qty 116, 90d supply, fill #0

## 2024-05-18 NOTE — Progress Notes (Signed)
 " Cardiology Office Note:    Date:  05/18/2024   ID:  [name], DOB 09-24-1965, MRN [id number]  PCP:  Theophilus Andrews, Tully GRADE, MD  Cardiologist:  Dub Huntsman, DO  Electrophysiologist:  None   Referring MD: Theophilus Andrews, Tully GRADE, MD    I am ok  History of Present Illness:    [name] is a 58 y.o. female with a hx of mixed connective tissue disorder, intermittent AV block who underwent PPM insertion complicated by lead migration and asymptomatic perforation-she underwent insertion of a new lead, PAF which was recently found on her ppm interrogation, COPD, mild thrombocytosis, iron  deficiency anemia, secondary to absorption after bariatric surgery in 2012, concern for mixed connective tissue disorder, sleep apnea no longer on CPAP, hypertension, irritable bowel syndrome and obesity.     At her last visit she was experiencing chest pain and shortness of breath - this was concerning therefore I send her for a right and left heart cath to assess for progressive CAD and understand right heart pressures in the setting of her rheumatic/connective tissue disease, OSA.   She did get her cardiac cath which showed normal coronaries with LVEDP at 23 mmHG with Right heart pressures reflection Mild Pulmonary hypertension.   She did get her PFT but results still pending.    Past Medical History:  Diagnosis Date   A-fib [facility])    Allergic rhinitis, cause unspecified    Allergy 2016   Anemia, iron  deficiency    Arthritis    Back pain    CFS (chronic fatigue syndrome)    Chest pain    COVID-19 06/15/2020   Diastolic dysfunction    Diverticulosis of colon    Edema, lower extremity    Fatty liver    Gallbladder problem    GERD (gastroesophageal reflux disease)    Goiter    Hiatal hernia    Hypertension    Hypothyroidism    IBS (irritable bowel syndrome)    Joint pain    Morbid obesity (HCC)    Obstructive sleep apnea    OSA on CPAP    mild obstructive sleep  apnea with an AHI of 13/h.  On auto CPAP from 4 to 15cm H2O   Osteoarthritis    Pacemaker    SOB (shortness of breath)    Thrombocytosis    Undiagnosed cardiac murmurs    Vitamin B 12 deficiency    Vitamin D  deficiency     Past Surgical History:  Procedure Laterality Date   BARIATRIC SURGERY  06/17/2010   BILATERAL SALPINGECTOMY Bilateral 06/22/2014   Procedure: BILATERAL SALPINGECTOMY;  Surgeon: Ovid All, MD;  Location: [facility];  Service: Gynecology;  Laterality: Bilateral;   CYSTOSCOPY N/A 06/22/2014   Procedure: CYSTOSCOPY;  Surgeon: Ovid All, MD;  Location: [facility];  Service: Gynecology;  Laterality: N/A;   ENDOMETRIAL ABLATION  04/2007   FOOT SURGERY Left 11/24/2017   Dr Magdalen   IR LUMBAR PUNCTURE  01/22/2024   LAPAROSCOPIC ASSISTED VAGINAL HYSTERECTOMY N/A 06/22/2014   Procedure: LAPAROSCOPIC ASSISTED VAGINAL HYSTERECTOMY;  Surgeon: Ovid All, MD;  Location: [facility];  Service: Gynecology;  Laterality: N/A;   LEAD REVISION/REPAIR N/A 12/17/2021   Procedure: LEAD REVISION/REPAIR;  Surgeon: Waddell Danelle ORN, MD;  Location: [facility] INVASIVE CV LAB;  Service: Cardiovascular;  Laterality: N/A;   LIPOMA EXCISION     upper back   PACEMAKER IMPLANT N/A 12/09/2021   Procedure: PACEMAKER IMPLANT;  Surgeon: Waddell Danelle ORN, MD;  Location: [facility]  INVASIVE CV LAB;  Service: Cardiovascular;  Laterality: N/A;   RIGHT/LEFT HEART CATH AND CORONARY ANGIOGRAPHY N/A 05/12/2024   Procedure: RIGHT/LEFT HEART CATH AND CORONARY ANGIOGRAPHY;  Surgeon: Elmira Newman PARAS, MD;  Location: [facility] INVASIVE CV LAB;  Service: Cardiovascular;  Laterality: N/A;   SPINE SURGERY  02/10/2012   L4-l5 fusion--cohen    Current Medications: Current Meds  Medication Sig   amLODipine  (NORVASC ) 2.5 MG tablet Take 1 tablet (2.5 mg total) by mouth daily.   celecoxib  (CELEBREX ) 200 MG capsule TAKE 1 CAPSULE BY MOUTH DAILY AS NEEDED FOR PAIN (Patient taking differently: Take 200 mg by mouth daily as needed for mild pain  (pain score 1-3) or moderate pain (pain score 4-6).)   cetirizine  (ZYRTEC  ALLERGY) 10 MG tablet Take 1 tablet (10 mg total) by mouth daily.   Cholecalciferol  (VITAMIN D3) 50 MCG (2000 UT) TABS Take 2,000 Units by mouth daily.   COLLAGEN PO Take by mouth daily. Liquid collagen with biotin   cyanocobalamin  (VITAMIN B12) 1000 MCG/ML injection Inject 1 ml into the muscle once a week for a month.  Then inject 1 ml into the muscle once a month thereafter.   fluticasone  (FLONASE ) 50 MCG/ACT nasal spray Place 2 sprays into both nostrils daily.   Glucosamine HCl (GLUCOSAMINE PO) Take 1,000 mg by mouth daily.   hydrochlorothiazide  (HYDRODIURIL ) 25 MG tablet TAKE 1 TABLET (25 MG TOTAL) BY MOUTH DAILY.   Ibuprofen  200 MG CAPS Take 400 mg by mouth daily as needed (For pain).   levothyroxine  (SYNTHROID ) 88 MCG tablet TAKE 1 TABLET BY MOUTH EVERY DAY   Menthol , Topical Analgesic, (BIOFREEZE EX) Apply 1 application  topically daily as needed (On Knees and feet).   metoprolol  succinate (TOPROL  XL) 25 MG 24 hr tablet Take 1 tablet (25 mg total) by mouth 2 (two) times daily.   Multiple Vitamins-Minerals (BARIATRIC MULTIVITAMINS PO) Take 1 tablet by mouth daily in the afternoon.   nitroGLYCERIN  (NITROSTAT ) 0.4 MG SL tablet Place 1 tablet (0.4 mg total) under the tongue every 5 (five) minutes as needed for chest pain.   omeprazole  (PRILOSEC OTC) 20 MG tablet Take 20 mg by mouth daily.   torsemide  (DEMADEX ) 20 MG tablet Take 1 tablet (20mg ) by mouth twice daily on Mondays and Thursdays and once daily on other days of the week.   valsartan  (DIOVAN ) 160 MG tablet TAKE 1 TABLET BY MOUTH EVERY DAY   [DISCONTINUED] furosemide  (LASIX ) 40 MG tablet Take 1 tablet (40 mg total) by mouth daily.   [DISCONTINUED] potassium chloride  SA (KLOR-CON  M20) 20 MEQ tablet Take 1 tablet (20 mEq total) by mouth daily.     Allergies:   Penicillins   Social History   Socioeconomic History   Marital status: Married    Spouse name:  Cordella   Number of children: 3   Years of education: Some college   Highest education level: 12th grade  Occupational History   Occupation: Herbalist: UNEMPLOYED   Occupation: stay at home mom  Tobacco Use   Smoking status: Never    Passive exposure: Never   Smokeless tobacco: Never   Tobacco comments:    Never Used Tobacco  Vaping Use   Vaping status: Never Used  Substance and Sexual Activity   Alcohol use: No   Drug use: No   Sexual activity: Yes    Partners: Male    Birth control/protection: Surgical    Comment: Hysterectomy  Other Topics Concern   Not on file  Social History Narrative   09/21/19   From: [facility], [place] originally   Living: with husband, Cordella and 2 daughters w/ family   Work: homemaker      Family: 3 children - Sierra, [place], and Hospital Doctor. Has 8 grandchildren and one on the way      Enjoys: spend time with family, shopping      Exercise: trying to walk more   Diet: eats late at night, will go most of the day w/o eating      Safety   Seat belts: Yes    Guns: Yes  and secure   Safe in relationships: Yes    Social Drivers of Health   Tobacco Use: Low Risk (05/18/2024)   Patient History    Smoking Tobacco Use: Never    Smokeless Tobacco Use: Never    Passive Exposure: Never  Financial Resource Strain: Low Risk (03/25/2024)   Overall Financial Resource Strain (CARDIA)    Difficulty of Paying Living Expenses: Not hard at all  Food Insecurity: No Food Insecurity (03/25/2024)   Epic    Worried About Programme Researcher, Broadcasting/film/video in the Last Year: Never true    Ran Out of Food in the Last Year: Never true  Transportation Needs: No Transportation Needs (03/25/2024)   Epic    Lack of Transportation (Medical): No    Lack of Transportation (Non-Medical): No  Physical Activity: Insufficiently Active (03/25/2024)   Exercise Vital Sign    Days of Exercise per Week: 2 days    Minutes of Exercise per Session: 10 min  Stress: No Stress Concern  Present (03/25/2024)   [employer] of Occupational Health - Occupational Stress Questionnaire    Feeling of Stress: Not at all  Social Connections: Moderately Integrated (03/25/2024)   Social Connection and Isolation Panel    Frequency of Communication with Friends and Family: Three times a week    Frequency of Social Gatherings with Friends and Family: Twice a week    Attends Religious Services: More than 4 times per year    Active Member of Clubs or Organizations: No    Attends Engineer, Structural: Not on file    Marital Status: Married  Depression (PHQ2-9): Low Risk (04/14/2024)   Depression (PHQ2-9)    PHQ-2 Score: 0  Alcohol Screen: Not on file  Housing: Unknown (03/25/2024)   Epic    Unable to Pay for Housing in the Last Year: No    Number of Times Moved in the Last Year: Not on file    Homeless in the Last Year: No  Utilities: Not on file  Health Literacy: Not on file     Family History: The patient's family history includes Anxiety disorder in her mother; Arrhythmia in her mother; Arthritis in her mother; Breast cancer (age of onset: 21) in her maternal aunt; Breast cancer (age of onset: 61) in her mother; Cancer in her father; Colon polyps in her father; Diabetes in her mother; Healthy in her daughter, daughter, and daughter; Heart disease in her mother; Hyperlipidemia in her mother; Hypertension in her father and mother; Kidney cancer in her father; Lymphoma in her father; Obesity in her father; Ovarian cancer in her paternal grandmother; Stroke (age of onset: 48) in her maternal grandmother; Thyroid  disease in her mother. There is no history of Colon cancer.  ROS:   Review of Systems  Constitution: Negative for decreased appetite, fever and weight gain.  HENT: Negative for congestion, ear discharge, hoarse voice and sore throat.  Eyes: Negative for discharge, redness, vision loss in right eye and visual halos.  Cardiovascular: Reports palpitations. Negative  for chest pain, dyspnea on exertion, leg swelling, orthopnea  Respiratory: Negative for cough, hemoptysis, shortness of breath and snoring.   Endocrine: Negative for heat intolerance and polyphagia.  Hematologic/Lymphatic: Negative for bleeding problem. Does not bruise/bleed easily.  Skin: Negative for flushing, nail changes, rash and suspicious lesions.  Musculoskeletal: Negative for arthritis, joint pain, muscle cramps, myalgias, neck pain and stiffness.  Gastrointestinal: Negative for abdominal pain, bowel incontinence, diarrhea and excessive appetite.  Genitourinary: Negative for decreased libido, genital sores and incomplete emptying.  Neurological: Negative for brief paralysis, focal weakness, headaches and loss of balance.  Psychiatric/Behavioral: Negative for altered mental status, depression and suicidal ideas.  Allergic/Immunologic: Negative for HIV exposure and persistent infections.    EKGs/Labs/Other Studies Reviewed:    The following studies were reviewed today:   EKG:  The ekg ordered today demonstrates   bpm Recent Labs: 10/21/2023: Magnesium 2.0 04/14/2024: Platelet Count 420 04/27/2024: ALT 23; BUN 13; Creatinine, Ser 0.65; TSH 1.930 05/12/2024: Hemoglobin 11.9; Potassium 3.2; Sodium 140  Recent Lipid Panel    Component Value Date/Time   CHOL 203 (H) 06/11/2023 1304   TRIG 64 06/11/2023 1304   HDL 65 06/11/2023 1304   CHOLHDL 3 09/09/2022 1006   VLDL 16.8 09/09/2022 1006   LDLCALC 126 (H) 06/11/2023 1304    Physical Exam:    VS:  BP 126/70   Pulse 61   Ht 5' 3 (1.6 m)   Wt 246 lb (111.6 kg)   LMP 01/20/2011   SpO2 99%   BMI 43.58 kg/m     Wt Readings from Last 3 Encounters:  05/18/24 246 lb (111.6 kg)  05/12/24 242 lb (109.8 kg)  05/04/24 245 lb 6.4 oz (111.3 kg)     GEN: Well nourished, well developed in no acute distress HEENT: Normal NECK: No JVD; No carotid bruits LYMPHATICS: No lymphadenopathy CARDIAC: S1S2 noted,RRR, no murmurs, rubs,  gallops RESPIRATORY:  Clear to auscultation without rales, wheezing or rhonchi  ABDOMEN: Soft, non-tender, non-distended, +bowel sounds, no guarding. EXTREMITIES: No edema, No cyanosis, no clubbing MUSCULOSKELETAL:  No deformity  SKIN: Warm and dry NEUROLOGIC:  Alert and oriented x 3, non-focal PSYCHIATRIC:  Normal affect, good insight  ASSESSMENT:    1. Mixed connective tissue disease   2. Medication management   3. Mild pulmonary hypertension (HCC)   4. Primary hypertension   5. Mixed hyperlipidemia   6. OSA on CPAP   7. PVC (premature ventricular contraction)   8. PAF (paroxysmal atrial fibrillation) (HCC)   9. Heart block AV complete (HCC)   10. Status post placement of cardiac pacemaker    PLAN:    Dyspnea on exertion - symptoms still present. Cardiac cath revealing - with her elevated LVEDP with no CAD, this is consistent with Diastolic heart failure. Although her mild pulmonary hypertension may be playing a role. She has been on furosemide  40 mg daily for a while now. I will stop the furosemide  and start torsemide  20 mg mg twice daily Mondays and Thursdays with once daily on other days. With potassium supplement.   She is already on an ARB, valsartan  160 mg daily  We are still waiting for her PFT results. If this is abnormal I will refer her to Pulmonary, at that time hopefully her pulmonary hypertension can be addressed as well.   If normal PFT will refer her to or advanced heart failure clinic.  Premature Ventricular Contractions (PVCs) - continue the metoprolol .   Heart block s/p pacemaker implantation - 09/14/2023 interrogation reviewed.   PAF - on rate control and Eliquis for stroke prevention.   Hx of OSA - recently back on cpap.   Hypertension - at target, monitor on addition of amlodipine .   We discussed she needs Rheumatology care - she was seen at [facility] but her insurance does not cover this group. This can be quite expensive out of pocket. I will refer her to  Rheumatology at [facility].   Blood work with be done today.   The patient is in agreement with the above plan. The patient left the office in stable condition.  The patient will follow up in 12 months or sooner if needed.   Medication Adjustments/Labs and Tests Ordered: Current medicines are reviewed at length with the patient today.  Concerns regarding medicines are outlined above.  Orders Placed This Encounter  Procedures   Basic Metabolic Panel (BMET)   Magnesium   Ambulatory referral to Rheumatology   Meds ordered this encounter  Medications   torsemide  (DEMADEX ) 20 MG tablet    Sig: Take 1 tablet (20mg ) by mouth twice daily on Mondays and Thursdays and once daily on other days of the week.    Dispense:  156 tablet    Refill:  3   potassium chloride  SA (KLOR-CON  M20) 20 MEQ tablet    Sig: Take 1 tablet (20 meq) by mouth twice daily on Mondays and Thursdays and once daily on other days of the week.    Dispense:  156 tablet    Refill:  3    Patient Instructions  Medication Instructions:  Your physician has recommended you make the following change in your medication:  STOP: Furosemide  (Lasix ) START: Torsemide  20 mg twice daily on Mondays and Thursdays, then once daily every other day.  START: Potassium 20 mg twice daily on Mondays and Thursdays, then once daily every other day.  *If you need a refill on your cardiac medications before your next appointment, please call your pharmacy*  Lab Work: BMET, Mag If you have labs (blood work) drawn today and your tests are completely normal, you will receive your results only by: MyChart Message (if you have MyChart) OR A paper copy in the mail If you have any lab test that is abnormal or we need to change your treatment, we will call you to review the results.   Follow-Up: At [facility], you and your health needs are our priority.  As part of our continuing mission to provide you with exceptional heart care,  our providers are all part of one team.  This team includes your primary Cardiologist (physician) and Advanced Practice Providers or APPs (Physician Assistants and Nurse Practitioners) who all work together to provide you with the care you need, when you need it.  Your next appointment:   12 week(s)  Provider:   Ignatz Deis, DO      Adopting a Healthy Lifestyle.  Know what a healthy weight is for you (roughly BMI <25) and aim to maintain this   Aim for 7+ servings of fruits and vegetables daily   65-80+ fluid ounces of water  or unsweet tea for healthy kidneys   Limit to max 1 drink of alcohol per day; avoid smoking/tobacco   Limit animal fats in diet for cholesterol and heart health - choose grass fed whenever available   Avoid highly processed foods, and foods high in saturated/trans  fats   Aim for low stress - take time to unwind and care for your mental health   Aim for 150 min of moderate intensity exercise weekly for heart health, and weights twice weekly for bone health   Aim for 7-9 hours of sleep daily   When it comes to diets, agreement about the perfect plan isnt easy to find, even among the experts. Experts at the [employer] developed an idea known as the Healthy Eating Plate. Just imagine a plate divided into logical, healthy portions.   The emphasis is on diet quality:   Load up on vegetables and fruits - one-half of your plate: Aim for color and variety, and remember that potatoes dont count.   Go for whole grains - one-quarter of your plate: Whole wheat, barley, wheat berries, quinoa, oats, brown rice, and foods made with them. If you want pasta, go with whole wheat pasta.   Protein power - one-quarter of your plate: Fish, chicken, beans, and nuts are all healthy, versatile protein sources. Limit red meat.   The diet, however, does go beyond the plate, offering a few other suggestions.   Use healthy plant oils, such as olive, canola,  soy, corn, sunflower and peanut. Check the labels, and avoid partially hydrogenated oil, which have unhealthy trans fats.   If youre thirsty, drink water . Coffee and tea are good in moderation, but skip sugary drinks and limit milk and dairy products to one or two daily servings.   The type of carbohydrate in the diet is more important than the amount. Some sources of carbohydrates, such as vegetables, fruits, whole grains, and beans-are healthier than others.   Finally, stay active  Signed, Dub Huntsman, DO  05/18/2024 7:06 PM    [facility] "

## 2024-05-18 NOTE — Patient Instructions (Signed)
 Medication Instructions:  Your physician has recommended you make the following change in your medication:  STOP: Furosemide  (Lasix ) START: Torsemide  20 mg twice daily on Mondays and Thursdays, then once daily every other day.  START: Potassium 20 mg twice daily on Mondays and Thursdays, then once daily every other day.  *If you need a refill on your cardiac medications before your next appointment, please call your pharmacy*  Lab Work: BMET, Mag If you have labs (blood work) drawn today and your tests are completely normal, you will receive your results only by: MyChart Message (if you have MyChart) OR A paper copy in the mail If you have any lab test that is abnormal or we need to change your treatment, we will call you to review the results.   Follow-Up: At [facility], you and your health needs are our priority.  As part of our continuing mission to provide you with exceptional heart care, our providers are all part of one team.  This team includes your primary Cardiologist (physician) and Advanced Practice Providers or APPs (Physician Assistants and Nurse Practitioners) who all work together to provide you with the care you need, when you need it.  Your next appointment:   12 week(s)  Provider:   Kardie Tobb, DO

## 2024-05-19 ENCOUNTER — Ambulatory Visit: Payer: Self-pay | Admitting: Cardiology

## 2024-05-21 ENCOUNTER — Other Ambulatory Visit: Payer: Self-pay | Admitting: Internal Medicine

## 2024-05-21 DIAGNOSIS — I10 Essential (primary) hypertension: Secondary | ICD-10-CM

## 2024-05-22 ENCOUNTER — Other Ambulatory Visit: Payer: Self-pay | Admitting: Internal Medicine

## 2024-05-22 DIAGNOSIS — E039 Hypothyroidism, unspecified: Secondary | ICD-10-CM

## 2024-05-30 ENCOUNTER — Ambulatory Visit: Payer: Self-pay | Admitting: Cardiology

## 2024-05-30 DIAGNOSIS — Z7689 Persons encountering health services in other specified circumstances: Secondary | ICD-10-CM

## 2024-05-30 DIAGNOSIS — I272 Pulmonary hypertension, unspecified: Secondary | ICD-10-CM

## 2024-06-08 ENCOUNTER — Ambulatory Visit: Payer: 59

## 2024-06-08 DIAGNOSIS — I480 Paroxysmal atrial fibrillation: Secondary | ICD-10-CM | POA: Diagnosis not present

## 2024-06-09 LAB — CUP PACEART REMOTE DEVICE CHECK
Battery Remaining Longevity: 156 mo
Battery Remaining Percentage: 100 %
Brady Statistic RA Percent Paced: 38 %
Brady Statistic RV Percent Paced: 0 %
Date Time Interrogation Session: 20260114043300
Implantable Lead Connection Status: 753985
Implantable Lead Connection Status: 753985
Implantable Lead Implant Date: 20230717
Implantable Lead Implant Date: 20230717
Implantable Lead Location: 753859
Implantable Lead Location: 753860
Implantable Lead Model: 7841
Implantable Lead Model: 7842
Implantable Lead Serial Number: 1204466
Implantable Lead Serial Number: 1298900
Implantable Pulse Generator Implant Date: 20230717
Lead Channel Impedance Value: 573 Ohm
Lead Channel Impedance Value: 774 Ohm
Lead Channel Pacing Threshold Amplitude: 0.5 V
Lead Channel Pacing Threshold Pulse Width: 0.4 ms
Lead Channel Setting Pacing Amplitude: 2 V
Lead Channel Setting Pacing Amplitude: 3.5 V
Lead Channel Setting Pacing Pulse Width: 1 ms
Lead Channel Setting Sensing Sensitivity: 1 mV
Pulse Gen Serial Number: 603605
Zone Setting Status: 755011

## 2024-06-10 ENCOUNTER — Ambulatory Visit: Admitting: Cardiology

## 2024-06-12 ENCOUNTER — Ambulatory Visit: Payer: Self-pay | Admitting: Cardiology

## 2024-06-13 NOTE — Progress Notes (Signed)
 Remote PPM Transmission

## 2024-06-20 ENCOUNTER — Ambulatory Visit (HOSPITAL_BASED_OUTPATIENT_CLINIC_OR_DEPARTMENT_OTHER): Admitting: Pulmonary Disease

## 2024-06-21 ENCOUNTER — Other Ambulatory Visit: Payer: Self-pay | Admitting: Internal Medicine

## 2024-06-21 DIAGNOSIS — M2550 Pain in unspecified joint: Secondary | ICD-10-CM

## 2024-06-22 ENCOUNTER — Other Ambulatory Visit: Payer: Self-pay

## 2024-06-22 ENCOUNTER — Encounter: Payer: Self-pay | Admitting: Cardiology

## 2024-06-22 DIAGNOSIS — Z79899 Other long term (current) drug therapy: Secondary | ICD-10-CM

## 2024-06-22 DIAGNOSIS — I10 Essential (primary) hypertension: Secondary | ICD-10-CM

## 2024-06-28 ENCOUNTER — Ambulatory Visit (HOSPITAL_BASED_OUTPATIENT_CLINIC_OR_DEPARTMENT_OTHER): Admitting: Pulmonary Disease

## 2024-06-28 LAB — COMPREHENSIVE METABOLIC PANEL WITH GFR
ALT: 28 [IU]/L (ref 0–32)
AST: 34 [IU]/L (ref 0–40)
Albumin: 4.2 g/dL (ref 3.8–4.9)
Alkaline Phosphatase: 128 [IU]/L (ref 49–135)
BUN/Creatinine Ratio: 33 — ABNORMAL HIGH (ref 9–23)
BUN: 21 mg/dL (ref 6–24)
Bilirubin Total: <0.2 mg/dL (ref 0.0–1.2)
CO2: 25 mmol/L (ref 20–29)
Calcium: 9.4 mg/dL (ref 8.7–10.2)
Chloride: 98 mmol/L (ref 96–106)
Creatinine, Ser: 0.63 mg/dL (ref 0.57–1.00)
Globulin, Total: 2.7 g/dL (ref 1.5–4.5)
Glucose: 100 mg/dL — ABNORMAL HIGH (ref 70–99)
Potassium: 3.9 mmol/L (ref 3.5–5.2)
Sodium: 139 mmol/L (ref 134–144)
Total Protein: 6.9 g/dL (ref 6.0–8.5)
eGFR: 102 mL/min/{1.73_m2}

## 2024-06-28 LAB — MAGNESIUM: Magnesium: 2.2 mg/dL (ref 1.6–2.3)

## 2024-06-30 ENCOUNTER — Other Ambulatory Visit (HOSPITAL_BASED_OUTPATIENT_CLINIC_OR_DEPARTMENT_OTHER): Payer: Self-pay | Admitting: Internal Medicine

## 2024-06-30 ENCOUNTER — Ambulatory Visit: Payer: Self-pay | Admitting: Cardiology

## 2024-06-30 ENCOUNTER — Encounter: Payer: Self-pay | Admitting: Family

## 2024-06-30 DIAGNOSIS — Z1231 Encounter for screening mammogram for malignant neoplasm of breast: Secondary | ICD-10-CM

## 2024-07-13 ENCOUNTER — Ambulatory Visit

## 2024-07-14 ENCOUNTER — Inpatient Hospital Stay: Payer: Self-pay

## 2024-07-14 ENCOUNTER — Inpatient Hospital Stay: Admitting: Medical Oncology

## 2024-07-16 ENCOUNTER — Ambulatory Visit (HOSPITAL_BASED_OUTPATIENT_CLINIC_OR_DEPARTMENT_OTHER): Admitting: Radiology

## 2024-08-08 ENCOUNTER — Ambulatory Visit: Admitting: Neurology

## 2024-08-11 ENCOUNTER — Ambulatory Visit: Payer: Self-pay | Admitting: Cardiology

## 2024-09-07 ENCOUNTER — Ambulatory Visit: Payer: Self-pay

## 2024-12-07 ENCOUNTER — Ambulatory Visit: Payer: Self-pay

## 2025-03-08 ENCOUNTER — Ambulatory Visit: Payer: Self-pay

## 2025-06-07 ENCOUNTER — Ambulatory Visit: Payer: Self-pay

## 2025-09-06 ENCOUNTER — Ambulatory Visit: Payer: Self-pay
